# Patient Record
Sex: Female | Born: 1945
Health system: Southern US, Community
[De-identification: ages and names within clinical notes are randomized; demographics above are authoritative.]

## PROBLEM LIST (undated history)

## (undated) DIAGNOSIS — Z8719 Personal history of other diseases of the digestive system: Secondary | ICD-10-CM

## (undated) DIAGNOSIS — F39 Unspecified mood [affective] disorder: Secondary | ICD-10-CM

## (undated) DIAGNOSIS — Z9889 Other specified postprocedural states: Secondary | ICD-10-CM

## (undated) DIAGNOSIS — G2581 Restless legs syndrome: Secondary | ICD-10-CM

## (undated) DIAGNOSIS — D649 Anemia, unspecified: Secondary | ICD-10-CM

## (undated) DIAGNOSIS — G629 Polyneuropathy, unspecified: Secondary | ICD-10-CM

## (undated) DIAGNOSIS — R350 Frequency of micturition: Secondary | ICD-10-CM

## (undated) DIAGNOSIS — R112 Nausea with vomiting, unspecified: Secondary | ICD-10-CM

## (undated) DIAGNOSIS — K5792 Diverticulitis of intestine, part unspecified, without perforation or abscess without bleeding: Secondary | ICD-10-CM

## (undated) DIAGNOSIS — R011 Cardiac murmur, unspecified: Secondary | ICD-10-CM

## (undated) DIAGNOSIS — I341 Nonrheumatic mitral (valve) prolapse: Secondary | ICD-10-CM

## (undated) DIAGNOSIS — R197 Diarrhea, unspecified: Secondary | ICD-10-CM

## (undated) DIAGNOSIS — C801 Malignant (primary) neoplasm, unspecified: Secondary | ICD-10-CM

## (undated) DIAGNOSIS — F319 Bipolar disorder, unspecified: Secondary | ICD-10-CM

## (undated) DIAGNOSIS — F329 Major depressive disorder, single episode, unspecified: Secondary | ICD-10-CM

## (undated) DIAGNOSIS — F419 Anxiety disorder, unspecified: Secondary | ICD-10-CM

## (undated) DIAGNOSIS — I00 Rheumatic fever without heart involvement: Secondary | ICD-10-CM

## (undated) DIAGNOSIS — M1990 Unspecified osteoarthritis, unspecified site: Secondary | ICD-10-CM

## (undated) DIAGNOSIS — R351 Nocturia: Secondary | ICD-10-CM

## (undated) DIAGNOSIS — F32A Depression, unspecified: Secondary | ICD-10-CM

## (undated) HISTORY — DX: Diverticulitis of intestine, part unspecified, without perforation or abscess without bleeding: K57.92

## (undated) HISTORY — PX: ABDOMINAL HYSTERECTOMY: SHX81

## (undated) HISTORY — PX: CHOLECYSTECTOMY: SHX55

## (undated) HISTORY — DX: Rheumatic fever without heart involvement: I00

## (undated) HISTORY — PX: OTHER SURGICAL HISTORY: SHX169

## (undated) HISTORY — DX: Bipolar disorder, unspecified: F31.9

## (undated) HISTORY — PX: NISSEN FUNDOPLICATION: SHX2091

## (undated) HISTORY — PX: EYE SURGERY: SHX253

## (undated) HISTORY — PX: TONSILLECTOMY: SUR1361

## (undated) HISTORY — DX: Nonrheumatic mitral (valve) prolapse: I34.1

## (undated) HISTORY — DX: Unspecified osteoarthritis, unspecified site: M19.90

## (undated) HISTORY — PX: BACK SURGERY: SHX140

## (undated) HISTORY — PX: ROTATOR CUFF REPAIR: SHX139

---

## 1998-01-02 ENCOUNTER — Ambulatory Visit (HOSPITAL_BASED_OUTPATIENT_CLINIC_OR_DEPARTMENT_OTHER): Admission: RE | Admit: 1998-01-02 | Discharge: 1998-01-02 | Payer: Self-pay | Admitting: Orthopedic Surgery

## 2000-02-15 ENCOUNTER — Emergency Department (HOSPITAL_COMMUNITY): Admission: EM | Admit: 2000-02-15 | Discharge: 2000-02-15 | Payer: Self-pay | Admitting: Emergency Medicine

## 2001-01-18 HISTORY — PX: COLONOSCOPY: SHX174

## 2001-02-10 ENCOUNTER — Emergency Department (HOSPITAL_COMMUNITY): Admission: EM | Admit: 2001-02-10 | Discharge: 2001-02-10 | Payer: Self-pay | Admitting: Emergency Medicine

## 2001-02-13 ENCOUNTER — Ambulatory Visit (HOSPITAL_COMMUNITY): Admission: RE | Admit: 2001-02-13 | Discharge: 2001-02-13 | Payer: Self-pay | Admitting: Internal Medicine

## 2001-03-21 ENCOUNTER — Ambulatory Visit (HOSPITAL_COMMUNITY): Admission: RE | Admit: 2001-03-21 | Discharge: 2001-03-21 | Payer: Self-pay | Admitting: *Deleted

## 2001-09-29 ENCOUNTER — Ambulatory Visit (HOSPITAL_COMMUNITY): Admission: RE | Admit: 2001-09-29 | Discharge: 2001-09-29 | Payer: Self-pay | Admitting: Family Medicine

## 2001-09-29 ENCOUNTER — Encounter: Payer: Self-pay | Admitting: Family Medicine

## 2001-12-18 ENCOUNTER — Ambulatory Visit (HOSPITAL_COMMUNITY): Admission: RE | Admit: 2001-12-18 | Discharge: 2001-12-18 | Payer: Self-pay | Admitting: Neurology

## 2002-07-24 ENCOUNTER — Emergency Department (HOSPITAL_COMMUNITY): Admission: EM | Admit: 2002-07-24 | Discharge: 2002-07-24 | Payer: Self-pay | Admitting: *Deleted

## 2002-07-24 ENCOUNTER — Encounter: Payer: Self-pay | Admitting: Emergency Medicine

## 2004-02-21 ENCOUNTER — Ambulatory Visit (HOSPITAL_COMMUNITY): Admission: RE | Admit: 2004-02-21 | Discharge: 2004-02-21 | Payer: Self-pay | Admitting: Family Medicine

## 2004-04-20 ENCOUNTER — Ambulatory Visit (HOSPITAL_COMMUNITY): Admission: RE | Admit: 2004-04-20 | Discharge: 2004-04-20 | Payer: Self-pay | Admitting: Internal Medicine

## 2007-01-24 ENCOUNTER — Ambulatory Visit (HOSPITAL_COMMUNITY): Admission: RE | Admit: 2007-01-24 | Discharge: 2007-01-24 | Payer: Self-pay | Admitting: Family Medicine

## 2007-08-31 ENCOUNTER — Ambulatory Visit (HOSPITAL_COMMUNITY): Admission: RE | Admit: 2007-08-31 | Discharge: 2007-08-31 | Payer: Self-pay | Admitting: Family Medicine

## 2007-10-30 ENCOUNTER — Encounter (HOSPITAL_COMMUNITY): Admission: RE | Admit: 2007-10-30 | Discharge: 2007-11-29 | Payer: Self-pay | Admitting: Family Medicine

## 2007-12-01 ENCOUNTER — Encounter (HOSPITAL_COMMUNITY): Admission: RE | Admit: 2007-12-01 | Discharge: 2007-12-31 | Payer: Self-pay | Admitting: Family Medicine

## 2009-04-07 ENCOUNTER — Ambulatory Visit (HOSPITAL_COMMUNITY): Admission: RE | Admit: 2009-04-07 | Discharge: 2009-04-07 | Payer: Self-pay | Admitting: Ophthalmology

## 2009-05-19 ENCOUNTER — Ambulatory Visit (HOSPITAL_COMMUNITY): Admission: RE | Admit: 2009-05-19 | Discharge: 2009-05-19 | Payer: Self-pay | Admitting: Family Medicine

## 2009-09-20 HISTORY — PX: COLONOSCOPY: SHX174

## 2009-09-20 HISTORY — PX: ESOPHAGOGASTRODUODENOSCOPY: SHX1529

## 2009-09-26 ENCOUNTER — Ambulatory Visit: Payer: Self-pay | Admitting: Internal Medicine

## 2009-09-26 DIAGNOSIS — R197 Diarrhea, unspecified: Secondary | ICD-10-CM

## 2009-09-26 DIAGNOSIS — R159 Full incontinence of feces: Secondary | ICD-10-CM | POA: Insufficient documentation

## 2009-09-26 DIAGNOSIS — R1319 Other dysphagia: Secondary | ICD-10-CM

## 2009-10-16 ENCOUNTER — Ambulatory Visit: Payer: Self-pay | Admitting: Internal Medicine

## 2009-10-16 ENCOUNTER — Ambulatory Visit (HOSPITAL_COMMUNITY): Admission: RE | Admit: 2009-10-16 | Discharge: 2009-10-16 | Payer: Self-pay | Admitting: Internal Medicine

## 2009-10-17 ENCOUNTER — Encounter: Payer: Self-pay | Admitting: Internal Medicine

## 2010-02-20 ENCOUNTER — Encounter: Payer: Self-pay | Admitting: Urgent Care

## 2010-03-05 ENCOUNTER — Ambulatory Visit: Payer: Self-pay | Admitting: Gastroenterology

## 2010-03-10 ENCOUNTER — Ambulatory Visit (HOSPITAL_COMMUNITY): Admission: RE | Admit: 2010-03-10 | Discharge: 2010-03-10 | Payer: Self-pay | Admitting: Gastroenterology

## 2010-03-26 ENCOUNTER — Encounter: Payer: Self-pay | Admitting: Internal Medicine

## 2010-04-06 ENCOUNTER — Ambulatory Visit: Payer: Self-pay | Admitting: Internal Medicine

## 2010-04-06 ENCOUNTER — Ambulatory Visit (HOSPITAL_COMMUNITY): Admission: RE | Admit: 2010-04-06 | Discharge: 2010-04-06 | Payer: Self-pay | Admitting: Internal Medicine

## 2010-04-06 HISTORY — PX: ESOPHAGOGASTRODUODENOSCOPY: SHX1529

## 2010-04-11 ENCOUNTER — Emergency Department (HOSPITAL_COMMUNITY): Admission: EM | Admit: 2010-04-11 | Discharge: 2010-04-11 | Payer: Self-pay | Admitting: Emergency Medicine

## 2010-04-13 ENCOUNTER — Telehealth (INDEPENDENT_AMBULATORY_CARE_PROVIDER_SITE_OTHER): Payer: Self-pay

## 2010-05-04 ENCOUNTER — Encounter: Payer: Self-pay | Admitting: Urgent Care

## 2010-05-05 ENCOUNTER — Emergency Department (HOSPITAL_COMMUNITY): Admission: EM | Admit: 2010-05-05 | Discharge: 2010-05-05 | Payer: Self-pay | Admitting: Emergency Medicine

## 2010-05-12 ENCOUNTER — Ambulatory Visit: Payer: Self-pay | Admitting: Internal Medicine

## 2010-09-23 ENCOUNTER — Ambulatory Visit (HOSPITAL_COMMUNITY)
Admission: RE | Admit: 2010-09-23 | Discharge: 2010-09-23 | Payer: Self-pay | Source: Home / Self Care | Attending: Family Medicine | Admitting: Family Medicine

## 2010-10-11 ENCOUNTER — Encounter: Payer: Self-pay | Admitting: Family Medicine

## 2010-10-18 ENCOUNTER — Emergency Department (HOSPITAL_COMMUNITY)
Admission: EM | Admit: 2010-10-18 | Discharge: 2010-10-18 | Payer: Self-pay | Source: Home / Self Care | Admitting: Emergency Medicine

## 2010-10-20 NOTE — Assessment & Plan Note (Signed)
Summary: FECAL INCONTINENCE/SS   Visit Type:  Initial Consult Referring Provider:  Earma Reading Primary Care Provider:  [facility]  Chief Complaint:  fecal incontience.  History of Present Illness: Ms. Victoria Mccarthy is a pleasant 65 year old lady, who presents at the request of Earma Reading at [facility] for further evaluation of chronic diarrhea and fecal incontinence. Patient complains his symptoms for at least one year. She generally has 4-5 loose stools daily that sometimes his abdomen. Denies recent Abx use, travel abroad, ill contacts.  Consumes well-water. She never if has constipation or nocturnal diarrhea. She denies any melena or rectal bleeding. When the incontinence occurs she has only a couple seconds of sensation in the rectum or distal obstruction, now. She complains of a horrible odor. She denies any abdominal pain or heartburn. In the past several months she's had problems swallowing solid foods. She feels like the food dysphagia and lower esophagus. She takes Goody's powders regularly sometimes as much as 3-4 daily. Some days she does not take any.  Workup in November included negative stool studies. Patient states that all of her studies were negative. We received stool culture report which was negative. Labs August of 2010 showed AST 33, ALT slightly elevated at 57, albumin 4.4, Bilirubin and AP normal,  CBC normal, TSH normal, vitamin D level is 9 which is low.  Current Medications (verified): 1)  Alprazolam 1 Mg Tabs (Alprazolam) .... Take 1 Tablet By Mouth Four Times A Day 2)  Symbyax 6-50 Mg Caps (Olanzapine-Fluoxetine Hcl) .... Take 2 Tablets At Bedtime 3)  Buspar .... Take 1 Tablet By Mouth Three Times A Day 4)  Flexeril 5 Mg Tabs (Cyclobenzaprine Hcl) .... Take 1 Tablet By Mouth Once A Day 5)  Goody's .... As Needed 6)  Medication For Overactive Bladder  Allergies (verified): 1)  ! Darvocet 2)  ! Haldol 3)  ! Iodine 4)  !  Codeine  Past History:  Past Medical History: Rheumatic fever, MVP [facility] Osteoporosis Osteoarthritis Last EGD, 8/05, Dr. Jena Gauss, diverticulum vs rotated stomach, producing partitioning at [facility], prior fundoplication appears to have slipped Last TCS, 5/02, Dr. Jena Gauss, pancolonic deverticula, int hemorrhoids H/O diverticulitis, treated at least 5 times, hospitalized twice.  Last episode one year ago.  Past Surgical History: Hysterectomy, partial Back Surgery Cholecystectomy Lap Nissen, about 10 years ago, Dr. Vanna Scotland, repeat at [facility] 5 years ago Right cataract extraction Carpal Tunnel Release, right X2 Bladder Repair X2 for incontinence  Family History: Mother, CHF, DM, pacemaker Father, CVA, DM deceased No FH of CRC, chronic GI illnesses, liver disease.  Social History: Divorced. 2 children. Disabled secondary to bipolar d/o. Never used tob, no alcohol, no drugs.  Review of Systems General:  Denies fever, chills, anorexia, fatigue, and weight loss. Eyes:  Denies vision loss. ENT:  Complains of difficulty swallowing; denies sore throat and hoarseness. CV:  Denies chest pains, angina, palpitations, dyspnea on exertion, and peripheral edema. Resp:  Denies dyspnea at rest, dyspnea with exercise, and cough. GI:  See HPI. GU:  Complains of urinary incontinence; denies urinary burning and blood in urine. MS:  Complains of joint pain / LOM and low back pain. Derm:  Denies rash and itching. Neuro:  Denies weakness, paralysis, abnormal sensation, frequent headaches, memory loss, and confusion. Psych:  Complains of depression and anxiety; denies suicidal ideation. Endo:  Denies unusual weight change. Heme:  Denies bruising and bleeding. Allergy:  Denies hives and rash.  Vital Signs:  Patient profile:   65 year old female Height:      65.5 inches Weight:      193 pounds BMI:     31.74 Temp:     97.9 degrees F  oral Pulse rate:   88 / minute BP sitting:   140 / 90  (left arm) Cuff size:   regular  Vitals Entered By: Cloria Spring LPN (September 26, 2009 11:30 AM)  Physical Exam  General:  Well developed, well nourished, no acute distress. unkept.   Head:  Normocephalic and atraumatic. Eyes:  Conjunctivae pink, no scleral icterus.  Mouth:  Oropharyngeal mucosa moist, pink.  No lesions, erythema or exudate.   Tongue ring. Neck:  Supple; no masses or thyromegaly. Lungs:  Clear throughout to auscultation. Heart:  Regular rate and rhythm; no murmurs, rubs,  or bruits. Abdomen:  Obese. Normal BS.  Soft. Minimal tenderness LLQ.  No rebound or guarding. No abd bruit or hernia. No HSM or masses. Rectal:  deferred until time of colonoscopy.   Extremities:  No clubbing, cyanosis, edema or deformities noted. Neurologic:  Alert and  oriented x4;  grossly normal neurologically. Skin:  Intact without significant lesions or rashes. Cervical Nodes:  No significant cervical adenopathy. Psych:  Alert and cooperative. Normal mood and affect.  Impression & Recommendations:  Problem # 1:  FULL INCONTINENCE OF FECES (ICD-787.60)  Fecal incontinence associated with loose stool.  Multiple loose stools most days for past one year.  Recent stool studies negative.  Ddx includes IBS, microscopic/collagenous colitis.  Less likely neurogenic problem for incontinence.  Colonoscopy to be performed in near future.  Risks, alternatives, and benefits including but not limited to the risk of reaction to medication, bleeding, infection, and perforation were addressed.  Patient voiced understanding and provided verbal consent. Procedure to be done with MAC for sedation.  Patient c/o inadequate sedation with prior TCS.  Also on multiple antidepressants/anxiolytics. No ASA/Goody's powders for four days prior to procedure.  Orders: Consultation Level III (40981)  Problem # 2:  DYSPHAGIA (ICD-787.29)  H/O chronic GERD s/p lap Nissen  in remote past.  Patient states she had redo at [facility] several years ago, which I do not have any records.  C/O solid food dysphagia.  Abnormal EGD 2005.  Pursue EGD +/- ED as appropriate.  EGD to be performed in near future.  Risks, alternatives, benefits including but not limited to risk of reaction to medications, bleeding, infection, and perforation addressed.  Patient voiced understanding and verbal consent obtained.   Orders: Consultation Level III (19147)   I would like to thank Earma Reading, PA-C for allowing [place] to take part in the care of this nice patient.

## 2010-10-20 NOTE — Letter (Signed)
Summary: EGD/ED OR order  EGD/ED OR order   Imported By: Minna Merritts 03/26/2010 13:03:12  _____________________________________________________________________  External Attachment:    Type:   Image     Comment:   External Document

## 2010-10-20 NOTE — Assessment & Plan Note (Signed)
Summary: follow up after TCS - cdg   Visit Type:  Follow-up Visit Primary Care Provider:  Anner Crete  Chief Complaint:  F/U after TCS.  History of Present Illness: Patient here for f/u of EGD/TCS done in 1/11. She had normal esophagus status post passage of a 54-French Maloney dilator, intact Nissen fundoplication, diffuse patchy erythema and erosions of uncertain significance (likely aspirin and nonsteroidal antiinflammatory drugs effect), elongated redundant colon with pancolonic diverticula, random bx negative. Stool culture, CDiff, lactoferrin, O+P all negative.    Stools firm on Questran. One BM daily. No accidents. Still choking on foods. Choked on rice last week. No heartburn or reflux. Has had difficulty swallowing rice, bread, meats since esophageal dilation. No nausea. Vomited up food after became lodged last week.   Current Medications (verified): 1)  Alprazolam 1 Mg Tabs (Alprazolam) .... Take 1 Tablet By Mouth Four Times A Day 2)  Symbyax 6-50 Mg Caps (Olanzapine-Fluoxetine Hcl) .... Take 2 Tablets At Bedtime 3)  Buspar 15 Mg .... Take 1 Tablet By Mouth Three Times A Day 4)  Flexeril 10 Mg Tabs (Cyclobenzaprine Hcl) .... Take 1 Tablet By Mouth Twice Daily 5)  Goody's .... As Needed 6)  Medication For Overactive Bladder 7)  Cholestyramine 4 Gm Pack (Cholestyramine) .... 2 Grams Daily 2 Hrs Before and After All Other Meds For Stools 8)  Nabumetone 750 Mg Tabs (Nabumetone) .... Take 1 Tablet By Mouth Two Times A Day  Allergies (verified): 1)  ! Darvocet 2)  ! Haldol 3)  ! Iodine 4)  ! Codeine  Review of Systems      See HPI  Vital Signs:  Patient profile:   65 year old female Height:      65.5 inches Weight:      188 pounds BMI:     30.92 Temp:     98.8 degrees F oral Pulse rate:   84 / minute BP sitting:   134 / 80  (left arm) Cuff size:   large  Vitals Entered By: Cloria Spring LPN (March 05, 2010 1:58 PM)  Physical Exam  General:  Well developed, well  nourished, no acute distress. Head:  Normocephalic and atraumatic. Eyes:  sclera nonicteric Mouth:  op moist Lungs:  Clear throughout to auscultation. Heart:  Regular rate and rhythm; no murmurs, rubs,  or bruits. Abdomen:  Bowel sounds normal.  Abdomen is soft, nontender, nondistended.  No rebound or guarding.  No hepatosplenomegaly, masses or hernias.  No abdominal bruits.  Extremities:  No clubbing, cyanosis, edema or deformities noted. Neurologic:  Alert and  oriented x4;  grossly normal neurologically. Skin:  Intact without significant lesions or rashes. Psych:  Alert and cooperative. Normal mood and affect.  Impression & Recommendations:  Problem # 1:  DYSPHAGIA (ICD-787.29)  Persistent solid-food dysphagia s/p dilation in 1/11. BPE to look for stricture or motility d/o.   Orders: Est. Patient Level II (42595)  Problem # 2:  FULL INCONTINENCE OF FECES (ICD-787.60)  Diarrhea and incontinence resolved with low-dose questran.   Orders: Est. Patient Level II (63875)

## 2010-10-20 NOTE — Letter (Signed)
Summary: BPE ORDER  BPE ORDER   Imported By: Ave Filter 03/05/2010 14:26:53  _____________________________________________________________________  External Attachment:    Type:   Image     Comment:   External Document

## 2010-10-20 NOTE — Progress Notes (Signed)
Summary: phone note/ problems post EGD  Phone Note Call from Patient   Caller: Patient Summary of Call: Pt called and said she went to ER over the week-end. Had EGD iwth dilation on 04/06/2010, and has had alot of tongue swelling and throat pain. She said she was given liquid Roxicet and she is requesting more. Said she still can't lick her lips. Has steroids to start on. Please advise! Initial call taken by: Cloria Spring LPN,  April 13, 2010 2:51 PM     Appended Document: phone note/ problems post EGD ER note reviewed.  NUR was consulted, gave steroids/roxicet.  Please find out why patient has not started taking steroids she was given 2 days ago?  Please have her start them.  If she is in severe pain to where she feels she still needs narcotics, you will need to discuss this w/ [facility] please.  Thanks  Appended Document: phone note/ problems post EGD Pt said she did start steroids on Sun. Still in alot of pain and would like medication. Told her  I will let Dr. Darrick Penna know, not sure when i will hear back, if pain gets severe she might need to  go to the ER.  Appended Document: phone note/ problems post EGD Please callpt. She needs a GASTROGRAFFIN BARIUM SWALLOW if she is having chest pain after dilation TODAY. Dr. Jena Gauss did not see any tear in her esophagus after he stretched her. Will await study prior to recommendations for pain management.  Appended Document: phone note/ problems post EGD Pt said she is not having any chest pain. Still having the soreness/pain in her mouth and when she tries to swallow, can hardly eat food. Please advise!  Appended Document: phone note/ problems post EGD Please call pt. We will call in Viscous Lidocaine 2 tsp with 1 Tbsp Maalox or Mylanta, swish and swallow prior to meals every 4 hours as needed for mouth pain, #200 ml, rfx1.  Appended Document: phone note/ problems post EGD Pt informed, and Rx called to [facility] [employer].

## 2010-10-20 NOTE — Letter (Signed)
Summary: TCS/EGD ORDER  TCS/EGD ORDER   Imported By: Ave Filter 09/26/2009 13:07:29  _____________________________________________________________________  External Attachment:    Type:   Image     Comment:   External Document

## 2010-10-20 NOTE — Medication Information (Signed)
Summary: RX Folder  RX Folder   Imported By: Peggyann Shoals 02/20/2010 15:21:36  _____________________________________________________________________  External Attachment:    Type:   Image     Comment:   External Document  Appended Document: RX Folder:Questran Pt needs OV, will give enough until then.   Prescriptions: CHOLESTYRAMINE 4 GM PACK (CHOLESTYRAMINE) 2 grams daily 2 hrs before and after all other meds for stools  #60 grams x 0   Entered and Authorized by:   Joselyn Arrow FNP-BC   Signed by:   Joselyn Arrow FNP-BC on 02/23/2010   Method used:   Electronically to        [facility]* (retail)       [address]       [place], [place]  [address]       Ph: [phone]       Fax: [phone]   RxID:   [id number]     Appended Document: RX Folder PT HAD APPT ON 03/05/10

## 2010-10-20 NOTE — Letter (Signed)
Summary: REFERRAL DR [facility]  REFERRAL DR [facility]   Imported By: Diana Eves 10/17/2009 10:32:38  _____________________________________________________________________  External Attachment:    Type:   Image     Comment:   External Document

## 2010-10-20 NOTE — Assessment & Plan Note (Signed)
Summary: s/p EGD/ED, still having trouble swallowing & nausea/MM   Visit Type:  f/u Primary Care Provider:  [facility]  Chief Complaint:  follow up from procedure and having  dysphagia needs refills.  History of Present Illness: Ms Cambria is here for f/u. She had her second EGD/ED for the year back on 04/06/10. She had somewhat baggy atonic appearing esophagus, intact Nissen fundoplication, otherwise unremarkable esophagogastroduodenoscopy, status post dilation of the esophagus up to a 58-French size with [facility] dilators. BPE prior to her last EGD/ED did show obstruction of barium tablet. Original fundoplication was in the 1990s but she had repair approximately five years ago.  Still having trouble with breads, meats, rice. No pain with swallowing. After EGD, had severe tongue pain. Tongue was not red or swollen. Started day of procedure. Lasted for few days. No abd pain. BM regular on Questran. No melena, brbpr. No vomiting. No heartburn.   Current Medications (verified): 1)  Alprazolam 1 Mg Tabs (Alprazolam) .... Take 1 Tablet By Mouth Four Times A Day 2)  Symbyax 6-50 Mg Caps (Olanzapine-Fluoxetine Hcl) .... Take 2 Tablets At Bedtime 3)  Buspar 15 Mg .... Take 1 Tablet By Mouth Three Times A Day 4)  Flexeril 10 Mg Tabs (Cyclobenzaprine Hcl) .... Take 1 Tablet By Mouth Twice Daily 5)  Goody's .... As Needed 6)  Cholestyramine 4 Gm Pack (Cholestyramine) .... 2 Grams Daily 2 Hrs Before and After All Other Meds For Stools 7)  Nabumetone 750 Mg Tabs (Nabumetone) .... Take 1 Tablet By Mouth Two Times A Day  Allergies: 1)  ! Darvocet 2)  ! Haldol 3)  ! Iodine 4)  ! Codeine 5)  ! Dilaudid (Hydromorphone Hcl)  Past History:  Past Surgical History: Last updated: 09/26/2009 Hysterectomy, partial Back Surgery Cholecystectomy Lap Nissen, about 10 years ago, Dr. Vanna Scotland, repeat at [facility] 5 years ago Right cataract extraction Carpal Tunnel Release, right X2 Bladder  Repair X2 for incontinence  Past Medical History: Rheumatic fever, MVP B[facility] Overactive bladder Osteoporosis Osteoarthritis EGD, 8/05, Dr. Jena Gauss, diverticulum vs rotated stomach, producing partitioning at [facility], prior fundoplication appears to have slipped EGD, 7/11, Dr. Rourk--> Somewhat baggy atonic appearing esophagus, intact Nissen fundoplication, otherwise unremarkable esophagogastroduodenoscopy, status post dilation of the esophagus up to a 58-French size with [facility] dilators. EGD, 1/11, Dr. Elmer Ramp esophagus status post passage of a 54-French [facility] dilator.  Intact Nissen fundoplication.  Diffuse patchy erythema and erosions of uncertain significance (likely aspirin and nonsteroidal antiinflammatory drugs effect) status post biopsy (benign) TCS, 5/02, Dr. Jena Gauss, pancolonic deverticula, int hemorrhoids TCS, 1/11, Dr. Rourk--> Single external hemorrhoidal tag and anal papilla, otherwise normal rectum.  Elongated redundant colon with pancolonic diverticula. The remainder of the colonic mucosa appeared normal status post sigmoid biopsy and stool sampling (all unremarkable) H/O diverticulitis, treated at least 5 times, hospitalized twice.  Last episode one year ago.  Review of Systems      See HPI  Vital Signs:  Patient profile:   65 year old female Height:      65.5 inches Weight:      183 pounds BMI:     30.10 Temp:     98.6 degrees F oral Pulse rate:   72 / minute BP sitting:   118 / 80  (left arm) Cuff size:   regular  Vitals Entered By: Hendricks Limes LPN (May 12, 2010 10:42 AM)  Physical Exam  General:  Well developed, well nourished, no acute distress.  Head:  Normocephalic and atraumatic. Eyes:  sclera nonicteric Mouth:  op moist Lungs:  Clear throughout to auscultation. Heart:  Regular rate and rhythm; no murmurs, rubs,  or bruits. Abdomen:  Bowel sounds normal.  Abdomen is soft, nontender, nondistended.  No rebound or  guarding.  No hepatosplenomegaly, masses or hernias.  No abdominal bruits.  Extremities:  No clubbing, cyanosis, edema or deformities noted. Neurologic:  Alert and  oriented x4;  grossly normal neurologically. Skin:  Intact without significant lesions or rashes. Psych:  Alert and cooperative. Normal mood and affect.  Impression & Recommendations:  Problem # 1:  DYSPHAGIA (WUJ-811.91) Persistent dysphagia s/p second dilation this year. BPE prior to last EGD/Ed showed obstruction of barium tablet. She gives h/o esophageal spasms for which she takes NTG (3 times in last six months). No obvious esophageal dysmotility on BPE. At this point, would recommend referral to [facility] for further evaluation of ongoing dysphagia.   Problem # 2:  DIARRHEA (ICD-787.91) Doing well on Questran. Prescriptions: CHOLESTYRAMINE 4 GM PACK (CHOLESTYRAMINE) 2 grams daily 2 hrs before and after all other meds for stools  #124 grams x 5   Entered and Authorized by:   Leanna Battles. Dixon Boos   Signed by:   Leanna Battles Dixon Boos on 05/12/2010   Method used:   Electronically to        [facility]* (retail)       [address]       [place], [place]  [address]       Ph: [phone]       Fax: [phone]   RxID:   [id number]   Appended Document: Orders Update    Clinical Lists Changes  Orders: Added new Service order of Est. Patient Level II (44034) - Signed      Appended Document: s/p EGD/ED, still having trouble swallowing & nausea/MM made apt with Dr . Despina Hidden surgery at [facility] on 06-17-10 at 11:15 pt informed of apt  Appended Document: s/p EGD/ED, still having trouble swallowing & nausea/MM Durward Mallard, RMR wants her to have f/u with surgeon who did her wrap. Can you find out if that was [place] (that is who Erskine Squibb made appt with).  Appended Document: s/p EGD/ED, still having trouble swallowing & nausea/MM I spoke with Kyra Leyland at [facility] and she  confirmed Dr Lorin Picket did do her last wrap.

## 2010-10-20 NOTE — Medication Information (Signed)
Summary: Tax adviser   Imported By: Rosine Beat 05/04/2010 10:13:15  _____________________________________________________________________  External Attachment:    Type:   Image     Comment:   External Document  Appended Document: RX Folder:Cholestyramine

## 2010-12-05 LAB — BASIC METABOLIC PANEL
Chloride: 101 mEq/L (ref 96–112)
Creatinine, Ser: 0.87 mg/dL (ref 0.4–1.2)
GFR calc non Af Amer: >60 mL/min (ref >60)
Glucose, Bld: 142 mg/dL — ABNORMAL HIGH (ref 70–99)
Potassium: 4 mEq/L (ref 3.5–5.1)
Sodium: 134 mEq/L — ABNORMAL LOW (ref 135–145)

## 2010-12-05 LAB — HEMOGLOBIN AND HEMATOCRIT, BLOOD
HCT: 37.1 % (ref 36.0–46.0)
Hemoglobin: 12.8 g/dL (ref 12.0–15.0)

## 2010-12-06 LAB — CLOSTRIDIUM DIFFICILE EIA: C difficile Toxins A+B, EIA: NEGATIVE

## 2010-12-06 LAB — BASIC METABOLIC PANEL
BUN: 6 mg/dL (ref 6–23)
Calcium: 9.3 mg/dL (ref 8.4–10.5)
Chloride: 99 mEq/L (ref 96–112)
GFR calc Af Amer: >60 mL/min (ref >60)
Glucose, Bld: 105 mg/dL — ABNORMAL HIGH (ref 70–99)
Potassium: 3.6 mEq/L (ref 3.5–5.1)
Sodium: 136 mEq/L (ref 135–145)

## 2010-12-06 LAB — STOOL CULTURE

## 2010-12-06 LAB — HEMOGLOBIN AND HEMATOCRIT, BLOOD: Hemoglobin: 12.7 g/dL (ref 12.0–15.0)

## 2010-12-06 LAB — FECAL LACTOFERRIN, QUANT

## 2010-12-21 ENCOUNTER — Other Ambulatory Visit: Payer: Self-pay | Admitting: Gastroenterology

## 2010-12-27 LAB — HEMOGLOBIN AND HEMATOCRIT, BLOOD
HCT: 36.8 % (ref 36.0–46.0)
Hemoglobin: 13.1 g/dL (ref 12.0–15.0)

## 2010-12-27 LAB — BASIC METABOLIC PANEL
CO2: 28 mEq/L (ref 19–32)
Calcium: 9.8 mg/dL (ref 8.4–10.5)
Creatinine, Ser: 0.73 mg/dL (ref 0.4–1.2)
Glucose, Bld: 111 mg/dL — ABNORMAL HIGH (ref 70–99)

## 2011-02-05 NOTE — Cardiovascular Report (Signed)
[place]  Patient:    Victoria Mccarthy, Victoria Mccarthy                      MRN: [id number] Proc. Date: 03/21/01 Adm. Date:  [id number] Attending:  Lenise Herald H CC:         Burton Apley, M.D.   Cardiac Catheterization  PROCEDURES: 1. Left heart catheterization. 2. Coronary angiography. 3. Left ventriculogram.  COMPLICATIONS:  None.  INDICATIONS:  The patient is a 65 year old, white female, patient of Dr. Burton Apley with a positive family history of CAD and recurrent chest pain.  The patient underwent Persantine Cardiolite on February 21, 2001, revealing anterior wall thinning with suggestion of breast attenuation; however, anterior ischemia cannot be excluded.  She had normal EF.  A 2-D echocardiogram revealed normal LV function with mildly thickened aortic valve. Because of her recurrent chest pain, she is now brought to the cardiac catheterization lab to define her coronary anatomy.  DESCRIPTION OF PROCEDURE:  After given informed written consent, the patient was brought to the cardiac catheterization lab where her right and left groins were shaved, prepped, and draped in the usual sterile fashion.  ECG monitoring was established.  Using modified Seldinger technique, a #6 French arterial sheath was inserted in the right femoral artery.  A 6 French diagnostic catheter was then used to perform diagnostic angiography.  This reveals a large left main with no significant disease.  The LAD is a large vessel which courses to the apex and gave rise to one diagonal branch.  The LAD has no significant disease.  The first diagonal is a large vessel which bifurcates in its mid segment and has no significant disease.  The left coronary artery also gave rise to a large ramus intermedius, which bifurcated in its midportion and was noted to have a 50% proximal stenosis prior to bifurcation.  The left circumflex was a large vessel which coursed in the A-V groove  and gave rise to one obtuse marginal and the posterolateral branch and was a dominant vessel.  The circumflex was noted to have a distal 60% stenotic lesion in the distal portion of the A-V groove.  The remainder of the circumflex had no significant disease.  The first OM had no significant disease.  The second OM is a small vessel with no significant disease.  The right coronary artery is a medium sized vessel which is dominant and gives rise to the PDA as well as the posterolateral branch.  There is no significant disease in the RCA, PDA or posterolateral branch.  LEFT VENTRICULOGRAM:  The left ventriculogram reveals preserved EF at 50-55%.  HEMODYNAMICS:  Systemic arterial pressure 132/82, LV systemic pressure 132/12, LVEDP of 18.  Following the case, the right femoral site was closed using a Perclose device without complications.  A total of 1 g of Ancef was given prophylactically.  CONCLUSIONS: 1. No significant coronary artery disease. 2. Normal left ventricular systolic function. DD:  03/21/01 TD:  03/21/01 Job: [id number]

## 2011-02-05 NOTE — Op Note (Signed)
NAME:  Victoria Mccarthy, Victoria Mccarthy                         ACCOUNT NO.:  [id number]   MEDICAL RECORD NO.:  [id number]                   PATIENT TYPE:  AMB   LOCATION:  DAY                                  FACILITY:  [facility]   PHYSICIAN:  R. Roetta Sessions, M.D.              DATE OF BIRTH:  [birth year]   DATE OF PROCEDURE:  DATE OF DISCHARGE:                                 OPERATIVE REPORT   PROCEDURE:  Esophagogastroduodenoscopy, diagnostic.   INDICATION FOR PROCEDURE:  The patient is a 65 year old lady with a history  of regurgitation with partially digested food two to three times a day  postprandially.  She also gets left-sided retrosternal chest pain.  She  reports dysphagia to both solids and liquids and has not gotten any relief  with acid suppression therapy.  Past medical history is significant for  laparoscopic Nissen fundoplication back in the 90s.  EGD is now being done  to further evaluate her symptoms and the approach has been discussed with  the patient at length.  The potential risks, benefits, and alternatives have  been reviewed and questions answered.   PROCEDURE NOTE:  O2 saturation, blood pressure, pulse, and respirations were  monitored throughout the entire procedure.   CONSCIOUS SEDATION:  Versed 3 mg IV and Demerol 75 mg IV in divided doses.   SBE PROPHYLAXIS:  Ampicillin 2 grams IV, gentamicin 120 mg IV prior to the  procedure.   INSTRUMENT:  Olympus video system.   FINDINGS:  Examination of the tubular esophagus revealed normal mucosa.  There appeared to be a diverticulum versus rotation of the proximal stomach,  producing a large blind pouch.  As the distal esophagus was traversed there  was quite bit of food debris sequestered in this pocket.  Off to the side  was the lumen of the gastroesophageal junction.  The gastric cavity was  accessed, the pylorus was patent and easily traversed.  Examination of the  duodenal bulb and second portion revealed no abnormalities.   A retroflex  view of the proximal stomach and esophagogastric junction demonstrated a  rather loose and what appeared to be a partially slipped wrap.  There  appeared to be at least partial diaphragmatic hernia, I did not see anything  that looked obviously paraesophageal.  The scope was straightened and pulled  back to the EG junction.  Again, there was what appeared to be a  diverticulum or partitioning with a large blind pouch with quite a bit of  food debris retained within it.   THERAPEUTIC/DIAGNOSTIC MANEUVERS PERFORMED:  None.   The patient tolerated the procedure well and was reacted after endoscopy.   IMPRESSION:  1. Grossly normal-appearing esophagus except the esophagogastric junction.  2. Diverticulum, possibly __________ versus rotated stomach, producing     partitioning.  This is obstructing the transit of food and liquid from     the esophagus to the stomach.  3. Prior  fundoplication appears to have slipped, gastric mucosa otherwise     grossly normal.  Pylorus patent, duodenum appeared normal.   Today's findings likely explain the patient's symptoms.  We will need to go  ahead and proceed with an upper GI series to get a better look at what is  going on from a gross anatomy standpoint.  We will plan for later date and  further recommendations to follow.      ___________________________________________                                            Jonathon Bellows, M.D.   RMR/MEDQ  D:  04/20/2004  T:  04/20/2004  Job:  [id number]   cc:   Bernerd Limbo. Leona Carry, M.D.  [address]  [place]  [place] [address]  Fax: [phone]

## 2011-04-28 ENCOUNTER — Ambulatory Visit (HOSPITAL_COMMUNITY)
Admission: RE | Admit: 2011-04-28 | Discharge: 2011-04-28 | Disposition: A | Discharge status: Home / Self Care | Payer: Medicaid Other | Source: Ambulatory Visit | Attending: Physical Medicine and Rehabilitation | Admitting: Physical Medicine and Rehabilitation

## 2011-04-28 DIAGNOSIS — M6281 Muscle weakness (generalized): Secondary | ICD-10-CM | POA: Insufficient documentation

## 2011-04-28 DIAGNOSIS — IMO0001 Reserved for inherently not codable concepts without codable children: Secondary | ICD-10-CM | POA: Insufficient documentation

## 2011-04-28 DIAGNOSIS — M545 Low back pain, unspecified: Secondary | ICD-10-CM | POA: Insufficient documentation

## 2011-04-28 DIAGNOSIS — M961 Postlaminectomy syndrome, not elsewhere classified: Secondary | ICD-10-CM | POA: Insufficient documentation

## 2011-04-28 NOTE — Progress Notes (Signed)
INITIAL EVALUATION  Physical Therapy   Patient Name: Victoria Mccarthy Date Of Birth: 26-Jan-1946 Guardian Name: N/A Treatment ICD-9 Code: 16109 Address: [address] Date of Evaluation: 04/28/2011 [place], [place] [address] Requested Dates of Service: 04/28/2011 - 05/26/2011 Therapy History: No known therapy for this problem Reason For Referral: Recipient has a new injury, disease or condition Prior Level of Function: Independent/Modified Independent with all ADLs (OT/PT) or Audition, Communication, Voice and/or Swallowing Skills (ST/AUD) Additional Medical History: Pt is a 65 y.o female referred to PT secondary to low back pain and post laminectomy syndrome. Pt reports that she has had pain since 1983 after her initial back surgery. She reports that she had shoulder surgery 3 months ago which increased her back pain. She reports numbness in the R medial thigh and radiating pain in both legs, L >R. Pt. c/co is increased pain. Denies changes in B&B, no hx of cancer, MEDICAL HISTORY: back surgery 1983, bi-polar disorder, R RTC surgery 3 months ago, hysterectomy, gall bladder removal. PREVIOUS THERAPY: Pt has not received therapy for this condition in the past. Cognitive Status: Pt is alert and oriented x's 4. Social History: Lives alone, completes housework, enjoys sewing, quilting, Clinical cytogeneticist. Current Functional Status: Sit: 10 minutes; Walk: 15 minutes Stand: 10 minutes; Sleep: 2-3 hours and wakes up from pain. SUBJECTIVE: PATIENT GOAL: "I want to decrease my pain" Prematurity: N/A Severity Level: N/A Treatment Goals:  1. Goal: Pt will be Independent in HEP in order to maximize therapeutic effect.  Baseline: None Given  Duration: 2 Week(s)  2. Goal: Pt will have minimal spasm to gluteal region.  Baseline: PALPATION: Increased pain to L gluteal region.  Duration: 2 Week(s)  3. Goal: Pt will report pain less than 4/10 for 50% of her day while standing  Baseline: PAIN RATING: (on a scale from 0-10): worst: 10/10 best: 3 /10  current: 9/10 Nature: LB general pain to mid lumbar spine w/radiculopathy Aggravating Factors: increased movement. Alleviating Factors: Prednisone, Pain medication (oxycodone), laying in recliner  Duration: 2 Week(s)  4. Goal: Pt will increase LE strength by 1 muscle grade for improved ambulation.  Baseline: Lower Extremity Right Left Hip flexion 3/5 3/5 Glute Max 3/5 3/5 Glute Med 2+/5 3/5 Hip adductors 2/5 4/5 Quadriceps 5/5 3/5 Hamstrings 4/5 4/5 Lumbar 3/5 Abdominals 4/5  Duration: 2 Week(s)  5. Goal: 2. Pt will improve LE strength to WNL order to ambulate for 30 minutes in order to shop at the grocery store.  Baseline: Walk: 15 minutes  Duration: 4 Week(s)  6. Goal: 3. Pt will improve core strength to WNL in order to comfortably stand for 30 minutes in order to prepare a meal.  Baseline: Stand 10 minutes  Duration: 4 Week(s)  7. Goal: 4. Pt will present with lumbar AROM WNL in order to be comfortably complete her household activities.  Baseline: Lumbar: Flexion: 46 cm.; Extension: 23?, L SB: 12 in.; R SB: 10 in. (SB: measurements taken from tip of middle finger to fibular head) R rot: decreased 50% ; L rot: decreased 70% w/increased pain  Duration: 4 Week(s)  Treatment Frequency/Duration: 2x/week for 4 weeks  Units per visit: N/A  Additional Information: ASSESSMENT: Pt is a 65 y.o. female referred to PT for LBP and post laminectomy syndrome. After examination it was found that the patient has current body structure impairments including: increased pain with radicular pain to BLE, gluteal spasms, decreased LE and core strength, decreased flexibility, and impaired posture which are limiting her ability to  participate in community and household activities and functions. Pt will benefit from skilled PT service to address the above body structure impairments in order to maximize function in order to improve quality of life. TREATMENT DIAGNOSIS: Low Back Pain 724.2, Post Lamienctomy Syndrome 722.80  Rehab Potential: Fair PLAN: 1. Therapeutic exercise to include stretching, strengthening and HEP. 2. Gait training 3. Balance re-education 4. Manual techniques and modalities as needed for pain reduction. FREQUENCY / DURATION: 2 x/week x 4 weeks.

## 2011-04-28 NOTE — Progress Notes (Addendum)
Physical Therapy Evaluation  Patient Name: ORAH SONNEN ZOXWR'U Date: 04/28/2011 Time: 0454-0981 Initial Eval for Medicaid: 04/28/11 Charges: 1 Eval HPI: Symptoms/Limitations Symptoms: LBP, post lamienctomy syndrome.   How long can you sit comfortably?: 10 minutes.  Most comfortable with legs elevated in recliner. How long can you stand comfortably?: 10 minutes How long can you walk comfortably?: 15 minutes Pain Assessment Currently in Pain?: Yes Pain Score:   9 Pain Location: Back Pain Orientation: Right;Left Pain Radiating Towards: B LE Pain Onset: Other (comment) (Chronic) Pain Frequency: Constant Pain Relieving Factors: Prednisone, Pain medication (oxycodone), laying in recliner Past Medical History: No past medical history on file. Past Surgical History: No past surgical history on file.   Balance Balance Assessed: Yes Static Standing Balance Static Standing - Level of Assistance: 7: Independent Single Leg Stance - Right Leg: 3  Single Leg Stance - Left Leg: 2  Tandem Stance - Right Leg: 4  Tandem Stance - Left Leg: 7   Exercise/Treatments Lumbar Stretches Active Hamstring Stretch: 30 seconds (BLE) Single Knee to Chest Stretch: 30 seconds (BLE) Lower Trunk Rotation: Other (comment) (10x) Press Ups: 2 reps;10 seconds ITB Stretch: 30 seconds (BLE) Stability Exercises Functional Squats: 10 reps Hip Stretches Active Hamstring Stretch: 30 seconds (BLE) Knee Stretches Active Hamstring Stretch: 30 seconds (BLE) ITB Stretch: 30 seconds (BLE)    Goals PT Short Term Goals Short Term Goal 1: 1.Pt will be Independent in HEP in order to maximize therapeutic effect. Short Term Goal 2: 2.Pt will report pain less than 4/10 for 50% of her day while standing Short Term Goal 3: 3.Pt will have minimal spasm to gluteal region. Short Term Goal 4: 4.Pt will increase LE strength by 1 muscle grade for improved ambulation. PT Long Term Goals Long Term Goal 1: 1.Pt will report pain  less than or equal to 3/10 for 75% of her day for improved quality of life. Long Term Goal 2: 2.Pt will improve LE strength to WNL order to ambulate for 30 minutes in order to shop at the grocery store. Long Term Goal 3: 3.Pt will improve core strength to WNL in order to comfortably stand for 30 minutes  in order to prepare a meal.  Long Term Goal 4: 4.Pt will present with lumbar AROM WNL in order to be comfortably complete her household activities.  End of Session Patient Active Problem List  Diagnoses  . DYSPHAGIA  . DIARRHEA  . FULL INCONTINENCE OF FECES  . Low back pain  . Post laminectomy syndrome   PT - End of Session Activity Tolerance: Patient limited by pain PT Assessment and Plan Clinical Impression Statement: Pt is a 65 y.o. female  referred to PT for LBP and post laminectomy syndrome.  After examination it was found that the patient has current body structure impairments including: increased pain with radicular pain to BLE, gluteal spasms, decreased LE and core strength, decreased flexibility, and impaired posture which are limiting her ability to participate in community and household activities and functions. Pt will benefit from skilled PT service to address the above body structure impairments in order to maximize function in order to improve quality of life. Rehab Potential: Fair PT Frequency: Min 2X/week PT Duration: 4 weeks PT Treatment/Interventions: Gait training;Functional mobility training;Therapeutic exercise;Neuromuscular re-education;Balance training;Patient/family education;Other (comment) (manual and modalites for pain control) PT Plan: improve lumbar ROM, flexibility and strength.   Time Calculation Start Time: 1432 Stop Time: 1518 Time Calculation (min): 46 min  Jered Heiny 04/28/2011, 6:16 PM

## 2011-04-28 NOTE — Patient Instructions (Addendum)
Initial evaluation, HEP and education on role of PT.  Pt agreeable to tx. And plan.  Pt educated on MEDICAID and signed Medicaid financial responsibility form. Pt was given Kathie Rhodes R[place] number to contact if she would like to continue with therapy.

## 2011-06-11 ENCOUNTER — Other Ambulatory Visit: Payer: Self-pay | Admitting: Physical Medicine and Rehabilitation

## 2011-06-11 ENCOUNTER — Other Ambulatory Visit (HOSPITAL_COMMUNITY): Payer: Self-pay | Admitting: Physical Medicine and Rehabilitation

## 2011-06-11 DIAGNOSIS — N2889 Other specified disorders of kidney and ureter: Secondary | ICD-10-CM

## 2011-06-14 ENCOUNTER — Other Ambulatory Visit: Payer: Medicaid Other

## 2011-06-15 ENCOUNTER — Ambulatory Visit (HOSPITAL_COMMUNITY)
Admission: RE | Admit: 2011-06-15 | Discharge: 2011-06-15 | Disposition: A | Discharge status: Home / Self Care | Payer: Medicaid Other | Source: Ambulatory Visit | Attending: Physical Medicine and Rehabilitation | Admitting: Physical Medicine and Rehabilitation

## 2011-06-15 DIAGNOSIS — N289 Disorder of kidney and ureter, unspecified: Secondary | ICD-10-CM | POA: Insufficient documentation

## 2011-06-15 DIAGNOSIS — N2889 Other specified disorders of kidney and ureter: Secondary | ICD-10-CM

## 2011-06-15 DIAGNOSIS — R9389 Abnormal findings on diagnostic imaging of other specified body structures: Secondary | ICD-10-CM | POA: Insufficient documentation

## 2011-06-18 ENCOUNTER — Other Ambulatory Visit: Payer: Self-pay | Admitting: Urology

## 2011-06-18 ENCOUNTER — Ambulatory Visit (HOSPITAL_COMMUNITY)
Admission: RE | Admit: 2011-06-18 | Discharge: 2011-06-18 | Disposition: A | Discharge status: Home / Self Care | Payer: Medicaid Other | Source: Ambulatory Visit | Attending: Urology | Admitting: Urology

## 2011-06-18 DIAGNOSIS — C649 Malignant neoplasm of unspecified kidney, except renal pelvis: Secondary | ICD-10-CM | POA: Insufficient documentation

## 2011-06-18 DIAGNOSIS — J9819 Other pulmonary collapse: Secondary | ICD-10-CM | POA: Insufficient documentation

## 2011-06-18 DIAGNOSIS — D4100 Neoplasm of uncertain behavior of unspecified kidney: Secondary | ICD-10-CM

## 2011-06-25 ENCOUNTER — Other Ambulatory Visit: Payer: Self-pay | Admitting: Urology

## 2011-06-25 DIAGNOSIS — D4100 Neoplasm of uncertain behavior of unspecified kidney: Secondary | ICD-10-CM

## 2011-06-29 ENCOUNTER — Ambulatory Visit (HOSPITAL_COMMUNITY)
Admission: RE | Admit: 2011-06-29 | Discharge: 2011-06-29 | Disposition: A | Discharge status: Home / Self Care | Payer: Medicaid Other | Source: Ambulatory Visit | Attending: Urology | Admitting: Urology

## 2011-06-29 DIAGNOSIS — R9389 Abnormal findings on diagnostic imaging of other specified body structures: Secondary | ICD-10-CM | POA: Insufficient documentation

## 2011-06-29 DIAGNOSIS — R319 Hematuria, unspecified: Secondary | ICD-10-CM | POA: Insufficient documentation

## 2011-06-29 DIAGNOSIS — M545 Low back pain, unspecified: Secondary | ICD-10-CM | POA: Insufficient documentation

## 2011-06-29 DIAGNOSIS — D4100 Neoplasm of uncertain behavior of unspecified kidney: Secondary | ICD-10-CM

## 2011-06-29 MED ORDER — IOHEXOL 300 MG/ML  SOLN
125.0000 mL | Freq: Once | INTRAMUSCULAR | Status: AC | PRN
  Administered 2011-06-29: 125 mL via INTRAVENOUS

## 2011-08-04 ENCOUNTER — Other Ambulatory Visit: Payer: Self-pay | Admitting: Urology

## 2011-08-09 ENCOUNTER — Other Ambulatory Visit: Payer: Self-pay | Admitting: Urology

## 2011-08-11 ENCOUNTER — Encounter (HOSPITAL_COMMUNITY): Payer: Self-pay | Admitting: Pharmacy Technician

## 2011-08-17 ENCOUNTER — Other Ambulatory Visit (HOSPITAL_COMMUNITY): Payer: Medicaid Other

## 2011-08-20 ENCOUNTER — Encounter (HOSPITAL_COMMUNITY)
Admission: RE | Admit: 2011-08-20 | Discharge: 2011-08-20 | Disposition: A | Discharge status: Home / Self Care | Payer: Medicaid Other | Source: Ambulatory Visit | Attending: Urology | Admitting: Urology

## 2011-08-20 ENCOUNTER — Encounter (HOSPITAL_COMMUNITY): Payer: Self-pay

## 2011-08-20 ENCOUNTER — Other Ambulatory Visit: Payer: Self-pay

## 2011-08-20 HISTORY — DX: Frequency of micturition: R35.0

## 2011-08-20 HISTORY — DX: Anemia, unspecified: D64.9

## 2011-08-20 HISTORY — DX: Nocturia: R35.1

## 2011-08-20 HISTORY — DX: Depression, unspecified: F32.A

## 2011-08-20 HISTORY — DX: Unspecified osteoarthritis, unspecified site: M19.90

## 2011-08-20 HISTORY — DX: Diarrhea, unspecified: R19.7

## 2011-08-20 HISTORY — DX: Other specified postprocedural states: R11.2

## 2011-08-20 HISTORY — DX: Other specified postprocedural states: Z98.890

## 2011-08-20 HISTORY — DX: Major depressive disorder, single episode, unspecified: F32.9

## 2011-08-20 HISTORY — DX: Unspecified mood (affective) disorder: F39

## 2011-08-20 HISTORY — DX: Restless legs syndrome: G25.81

## 2011-08-20 HISTORY — DX: Malignant (primary) neoplasm, unspecified: C80.1

## 2011-08-20 LAB — BASIC METABOLIC PANEL
BUN: 5 mg/dL — ABNORMAL LOW (ref 6–23)
Chloride: 95 mEq/L — ABNORMAL LOW (ref 96–112)
Glucose, Bld: 116 mg/dL — ABNORMAL HIGH (ref 70–99)
Potassium: 3.8 mEq/L (ref 3.5–5.1)
Sodium: 132 mEq/L — ABNORMAL LOW (ref 135–145)

## 2011-08-20 LAB — SURGICAL PCR SCREEN: MRSA, PCR: NEGATIVE

## 2011-08-20 LAB — CBC
HCT: 40.7 % (ref 36.0–46.0)
Hemoglobin: 13.9 g/dL (ref 12.0–15.0)
MCH: 29.8 pg (ref 26.0–34.0)
MCHC: 34.2 g/dL (ref 30.0–36.0)
RBC: 4.67 MIL/uL (ref 3.87–5.11)

## 2011-08-20 NOTE — Patient Instructions (Addendum)
20 Victoria Mccarthy  08/20/2011   Your procedure is scheduled on: 08/23/11  Report to [facility] at  5:15AM.  Call this number if you have problems the morning of surgery: [phone]   Remember:   Do not eat food:After Midnight.  May have clear liquids:until Midnight .  Clear liquids include soda, tea, black coffee, apple or grape juice, broth.  Take these medicines the morning of surgery with A SIP OF WATER: xanax / buspar / visteril / oxycodone   Do not wear jewelry, make-up or nail polish.  Do not wear lotions, powders, or perfumes. You may wear deodorant.  Do not shave 48 hours prior to surgery.  Do not bring valuables to the hospital.  Contacts, dentures or bridgework may not be worn into surgery.  Leave suitcase in the car. After surgery it may be brought to your room.  For patients admitted to the hospital, checkout time is 11:00 AM the day of discharge.   Patients discharged the day of surgery will not be allowed to drive home.  Name and phone number of your driver:   Special Instructions: CHG Shower Use Special Wash: 1/2 bottle night before surgery and 1/2 bottle morning of surgery.   Please read over the following fact sheets that you were given: MRSA Information

## 2011-08-22 NOTE — H&P (Signed)
Chief Complaint  Left renal neoplasm   History of Present Illness  Ms. Victoria Mccarthy is a 65 year old followed by Dr. Retta Diones for urge incontinence which has been treated with Vesicare 10 mg.  She also has chronic back pain and this has been treated and followed by Dr. Sheran Luz.  During the evaluation for her back pain, she did undergo an MRI of the spine which incidentally detected a left renal mass concerning for possible malignancy.  She was sent to Dr. Retta Diones for further evaluation and a dedicated renal CT scan was performed which confirmed an enhancing 3.4 x 3.2 cm neoplasm off the lower pole of the left kidney.  The lesion does abut the renal collecting system and is mostly endopytic although partially exophytic. There is no renal vein/IVC involvement, no regional lymphadenopathy or other evidence of abdominal metastases.  The contralteral kidney is normal and the adrenal glands are unremarkable.  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  She has undergone a chest x-ray which was negative for metatstases and her LFTs and alkaline phosphatase levels were normal. She has no abdominal pain related to this mass and no hematuria.  She has no family history of kidney cancer.  Baseline renal function: Cr. 0.73, eGFR > 60 Nephrometry score: 8p   Past Medical History Problems  1. History of  Anxiety (Symptom) 300.00 2. History of  Arthritis V13.4 3. History of  Depression 311 4. History of  Esophageal Reflux 530.81  Surgical History Problems  1. History of  Back Surgery 2. History of  Bladder Surgery 3. History of  Esophagogastric Fundoplasty Nissen Fundoplication 4. History of  Hysterectomy V45.77 5. History of  Repair Of Paraesophageal Hiatus Hernia   She apparently underwent attempt at laparoscopic hiatal hernia repair but necessitated open surgical conversion due to adhesions.   Current Meds 1. Cholestyramine  POWD; Therapy: (Recorded:29Nov2011) to 2. Cyclobenzaprine HCl 10 MG Oral Tablet; Therapy: (Recorded:29Nov2011) to 3. Symbyax 6-50 MG Oral Capsule; Therapy: (Recorded:29Nov2011) to 4. VESIcare 10 MG Oral Tablet; Therapy: (Recorded:29Nov2011) to 5. Xanax 1 MG Oral Tablet; Therapy: (Recorded:29Nov2011) to  Allergies Medication  1. Haldol SOLN  Family History Problems  1. Maternal history of  Diabetes Mellitus V18.0 2. Paternal history of  Diabetes Mellitus V18.0 3. 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    Vitals Vital Signs [Data Includes: Last 1 Day]  13Nov2012 09:07AM  Blood Pressure: 158 / 106 Heart Rate: 109  Physical Exam Constitutional: Well nourished and well developed . No acute distress.  ENT:. The ears and nose are normal in appearance.  Neck: The appearance of the neck is normal and no neck mass is present.  Pulmonary: No respiratory distress, normal respiratory rhythm and effort and clear bilateral breath sounds.  Cardiovascular: Heart rate and rhythm are normal . No peripheral edema.  Abdomen: upper midline incision site(s) well healed. The abdomen is soft and nontender. No masses are palpated. No CVA tenderness. No hernias are palpable. No hepatosplenomegaly noted.  Lymphatics: The supraclavicular, femoral and inguinal nodes are not enlarged or tender.  Skin: Normal skin turgor,  no visible rash and no visible skin lesions.  Neuro/Psych:. Mood and affect are appropriate.    Results/Data Urine [Data Includes: Last 1 Day]  13Nov2012  COLOR: YELLOW  Reference Range  YELLOW APPEARANCE: CLEAR  Reference Range CLEAR SPECIFIC GRAVITY: <1.005  Abnormal Low Reference Range 1.005-1.030 pH: 6.5  Reference Range 5.0-8.0 GLUCOSE: NEG mg/dL Reference Range NEG BILIRUBIN: NEG  Reference Range NEG KETONE: NEG mg/dL Reference Range NEG BLOOD: NEG  Reference Range NEG PROTEIN: NEG mg/dL Reference Range NEG UROBILINOGEN: 0.2 mg/dL Reference Range 1.6-1.0 NITRITE: NEG  Reference Range NEG LEUKOCYTE ESTERASE: NEG  Reference Range NEG   I have independently reviewed her CT scan, chest x-ray, laboratory work, and medical records with findings as dictated above.   Assessment Assessed  1. Renal Neoplasm 239.5  Plan Health Maintenance (V70.0)  1. UA With REFLEX  Done: 13Nov2012 09:01AM Renal Neoplasm (239.5)  2. Follow-up Schedule Surgery Office  Follow-up  Requested for: 13Nov2012  Discussion/Summary  1. Left renal neoplasm concerning for malignancy:   The patient was provided information regarding their renal mass including the relative risk of benign versus malignant pathology and the natural history of renal cell carcinoma and other possible malignancies of the kidney. The role of renal biopsy, laboratory testing, and imaging studies to further characterize renal masses and/or the presence of metastatic disease were explained. We discussed the role of active surveillance, surgical therapy with both radical nephrectomy and nephron-sparing surgery, and ablative therapy in the treatment of renal masses. In addition, we discussed our goals of providing an accurate diagnosis and oncologic control while maintaining optimal renal function as appropriate based on the size, location, and complexity of their renal mass as well as their co-morbidities.    We have discussed the risks of treatment in detail including but not limited to bleeding, infection, heart attack, stroke, death, venothromoboembolism, cancer recurrence, injury/damage to surrounding organs and structures,  urine leak, the possibility of open surgical conversion for patients undergoing minimally invasive surgery, the risk of developing chronic kidney disease and its associated implications, and the potential risk of end stage renal disease possibly necessitating dialysis.     Although her renal tumor does extend fairly centrally, I do think it could likely be managed with a partial nephrectomy and have recommended an attempted left partial nephrectomy. Due to the fact that she does have a large upper midline incision and has known issues related to abdominal adhesions, she understands the very high-risk for necessitating an open surgery although I did recommend an attempt at minimally invasive surgery with a left robotic-assisted laparoscopic partial nephrectomy.  She therefore understands the increased risk of possible open surgical conversion. She did specifically mention concern about lawsuits related to robotic surgery and I assured her that there have been no FDA recalls regarding the [employer] robotic system and that the risks/complications that have been associated with any lawsuits are related to the surgery itself and are known complications of this type of surgery regardless of the technique or instrumentation utilized. We have specifically reviewed those risks today. She expresses her understanding is agreeable to proceed with the proposed procedure and gives her informed consent. She was given the option of having this done in a minimally invasive fashion or via an open approach and she did wish to proceed with minimally invasive surgery if able.  CC: Dr. Marcine Matar Dr. Karleen Hampshire    Amendment: I also explicitly told her that her chronic back pain is unrelated to her renal neoplasm  and would not be improved after surgery.  Amended By: Heloise Purpura; 08/03/2011 9:55 AMEST

## 2011-08-23 ENCOUNTER — Other Ambulatory Visit: Payer: Self-pay | Admitting: Urology

## 2011-08-23 ENCOUNTER — Encounter (HOSPITAL_COMMUNITY): Payer: Self-pay | Admitting: Certified Registered Nurse Anesthetist

## 2011-08-23 ENCOUNTER — Encounter (HOSPITAL_COMMUNITY): Payer: Self-pay | Admitting: *Deleted

## 2011-08-23 ENCOUNTER — Inpatient Hospital Stay (HOSPITAL_COMMUNITY)
Admission: RE | Admit: 2011-08-23 | Discharge: 2011-08-25 | DRG: 658 | Disposition: A | Discharge status: Home / Self Care | Payer: Medicaid Other | Source: Ambulatory Visit | Attending: Urology | Admitting: Urology

## 2011-08-23 ENCOUNTER — Inpatient Hospital Stay (HOSPITAL_COMMUNITY): Payer: Medicaid Other | Admitting: Certified Registered Nurse Anesthetist

## 2011-08-23 ENCOUNTER — Encounter (HOSPITAL_COMMUNITY)
Admission: RE | Disposition: A | Discharge status: Home / Self Care | Payer: Self-pay | Source: Ambulatory Visit | Attending: Urology

## 2011-08-23 DIAGNOSIS — Z01812 Encounter for preprocedural laboratory examination: Secondary | ICD-10-CM

## 2011-08-23 DIAGNOSIS — K219 Gastro-esophageal reflux disease without esophagitis: Secondary | ICD-10-CM | POA: Diagnosis present

## 2011-08-23 DIAGNOSIS — G8929 Other chronic pain: Secondary | ICD-10-CM | POA: Diagnosis present

## 2011-08-23 DIAGNOSIS — M129 Arthropathy, unspecified: Secondary | ICD-10-CM | POA: Diagnosis present

## 2011-08-23 DIAGNOSIS — F329 Major depressive disorder, single episode, unspecified: Secondary | ICD-10-CM | POA: Diagnosis present

## 2011-08-23 DIAGNOSIS — F411 Generalized anxiety disorder: Secondary | ICD-10-CM | POA: Diagnosis present

## 2011-08-23 DIAGNOSIS — M549 Dorsalgia, unspecified: Secondary | ICD-10-CM | POA: Diagnosis present

## 2011-08-23 DIAGNOSIS — C649 Malignant neoplasm of unspecified kidney, except renal pelvis: Principal | ICD-10-CM | POA: Diagnosis present

## 2011-08-23 DIAGNOSIS — F3289 Other specified depressive episodes: Secondary | ICD-10-CM | POA: Diagnosis present

## 2011-08-23 HISTORY — PX: ROBOT ASSISTED LAPAROSCOPIC NEPHRECTOMY: SHX5140

## 2011-08-23 LAB — TYPE AND SCREEN
ABO/RH(D): O POS
Antibody Screen: NEGATIVE

## 2011-08-23 LAB — BASIC METABOLIC PANEL
BUN: 7 mg/dL (ref 6–23)
CO2: 27 mEq/L (ref 19–32)
GFR calc non Af Amer: 88 mL/min — ABNORMAL LOW (ref >90)
Glucose, Bld: 161 mg/dL — ABNORMAL HIGH (ref 70–99)
Potassium: 3.6 mEq/L (ref 3.5–5.1)
Sodium: 130 mEq/L — ABNORMAL LOW (ref 135–145)

## 2011-08-23 LAB — HEMOGLOBIN AND HEMATOCRIT, BLOOD: HCT: 35 % — ABNORMAL LOW (ref 36.0–46.0)

## 2011-08-23 SURGERY — ROBOTIC ASSISTED LAPAROSCOPIC NEPHRECTOMY
Anesthesia: General | Laterality: Left | Wound class: Clean Contaminated

## 2011-08-23 MED ORDER — LACTATED RINGERS IR SOLN
Status: DC | PRN
  Administered 2011-08-23: 1000 mL

## 2011-08-23 MED ORDER — DOCUSATE SODIUM 100 MG PO CAPS
100.0000 mg | ORAL_CAPSULE | Freq: Two times a day (BID) | ORAL | Status: DC
  Administered 2011-08-23 – 2011-08-25 (×4): 100 mg via ORAL
  Filled 2011-08-23 (×5): qty 1

## 2011-08-23 MED ORDER — DEXAMETHASONE SODIUM PHOSPHATE 10 MG/ML IJ SOLN
INTRAMUSCULAR | Status: DC | PRN
  Administered 2011-08-23: 10 mg via INTRAVENOUS

## 2011-08-23 MED ORDER — ZOLPIDEM TARTRATE 5 MG PO TABS
5.0000 mg | ORAL_TABLET | Freq: Every evening | ORAL | Status: DC | PRN
  Administered 2011-08-23 – 2011-08-24 (×2): 5 mg via ORAL
  Filled 2011-08-23 (×2): qty 1

## 2011-08-23 MED ORDER — LACTATED RINGERS IV SOLN: INTRAVENOUS | Status: DC

## 2011-08-23 MED ORDER — MIDAZOLAM HCL 5 MG/5ML IJ SOLN
INTRAMUSCULAR | Status: DC | PRN
  Administered 2011-08-23: 2 mg via INTRAVENOUS

## 2011-08-23 MED ORDER — GLYCOPYRROLATE 0.2 MG/ML IJ SOLN
INTRAMUSCULAR | Status: DC | PRN
  Administered 2011-08-23: .8 mg via INTRAVENOUS

## 2011-08-23 MED ORDER — MORPHINE SULFATE 2 MG/ML IJ SOLN
2.0000 mg | INTRAMUSCULAR | Status: DC | PRN
Start: 2011-08-23 — End: 2011-08-24
  Administered 2011-08-23 – 2011-08-24 (×5): 2 mg via INTRAVENOUS
  Administered 2011-08-24: 4 mg via INTRAVENOUS
  Filled 2011-08-23: qty 1
  Filled 2011-08-23: qty 2
  Filled 2011-08-23 (×2): qty 1
  Filled 2011-08-23: qty 2
  Filled 2011-08-23: qty 1

## 2011-08-23 MED ORDER — ROCURONIUM BROMIDE 100 MG/10ML IV SOLN
INTRAVENOUS | Status: DC | PRN
  Administered 2011-08-23: 20 mg via INTRAVENOUS
  Administered 2011-08-23: 10 mg via INTRAVENOUS
  Administered 2011-08-23: 50 mg via INTRAVENOUS
  Administered 2011-08-23: 10 mg via INTRAVENOUS

## 2011-08-23 MED ORDER — BUPIVACAINE LIPOSOME 1.3 % IJ SUSP
INTRAMUSCULAR | Status: DC | PRN
  Administered 2011-08-23: 40 mL

## 2011-08-23 MED ORDER — SCOPOLAMINE 1 MG/3DAYS TD PT72
MEDICATED_PATCH | TRANSDERMAL | Status: DC | PRN
  Administered 2011-08-23: 1 via TRANSDERMAL

## 2011-08-23 MED ORDER — CEFAZOLIN SODIUM 1-5 GM-% IV SOLN
1.0000 g | Freq: Three times a day (TID) | INTRAVENOUS | Status: AC
  Administered 2011-08-23 (×2): 1 g via INTRAVENOUS
  Filled 2011-08-23 (×3): qty 50

## 2011-08-23 MED ORDER — FENTANYL CITRATE 0.05 MG/ML IJ SOLN
25.0000 ug | INTRAMUSCULAR | Status: DC | PRN
  Administered 2011-08-23 (×3): 25 ug via INTRAVENOUS

## 2011-08-23 MED ORDER — DEXTROSE-NACL 5-0.45 % IV SOLN
INTRAVENOUS | Status: DC
  Administered 2011-08-23 – 2011-08-24 (×3): via INTRAVENOUS

## 2011-08-23 MED ORDER — LIDOCAINE HCL (CARDIAC) 20 MG/ML IV SOLN
INTRAVENOUS | Status: DC | PRN
  Administered 2011-08-23: 80 mg via INTRAVENOUS

## 2011-08-23 MED ORDER — EPHEDRINE SULFATE 50 MG/ML IJ SOLN
INTRAMUSCULAR | Status: DC | PRN
  Administered 2011-08-23: 10 mg via INTRAVENOUS
  Administered 2011-08-23 (×2): 5 mg via INTRAVENOUS
  Administered 2011-08-23: 10 mg via INTRAVENOUS

## 2011-08-23 MED ORDER — MANNITOL 25 % IV SOLN
INTRAVENOUS | Status: DC | PRN
  Administered 2011-08-23 (×2): 12.5 g via INTRAVENOUS

## 2011-08-23 MED ORDER — ONDANSETRON HCL 4 MG/2ML IJ SOLN
INTRAMUSCULAR | Status: DC | PRN
  Administered 2011-08-23: 4 mg via INTRAVENOUS

## 2011-08-23 MED ORDER — DIPHENHYDRAMINE HCL 50 MG/ML IJ SOLN: 12.5000 mg | Freq: Four times a day (QID) | INTRAMUSCULAR | Status: DC | PRN

## 2011-08-23 MED ORDER — ONDANSETRON HCL 4 MG/2ML IJ SOLN: 4.0000 mg | INTRAMUSCULAR | Status: DC | PRN

## 2011-08-23 MED ORDER — DIPHENHYDRAMINE HCL 12.5 MG/5ML PO ELIX: 12.5000 mg | ORAL_SOLUTION | Freq: Four times a day (QID) | ORAL | Status: DC | PRN

## 2011-08-23 MED ORDER — LACTATED RINGERS IV SOLN
INTRAVENOUS | Status: DC | PRN
  Administered 2011-08-23 (×3): via INTRAVENOUS

## 2011-08-23 MED ORDER — PROPOFOL 10 MG/ML IV EMUL
INTRAVENOUS | Status: DC | PRN
  Administered 2011-08-23: 200 mg via INTRAVENOUS

## 2011-08-23 MED ORDER — NEOSTIGMINE METHYLSULFATE 1 MG/ML IJ SOLN
INTRAMUSCULAR | Status: DC | PRN
  Administered 2011-08-23: 5 mg via INTRAVENOUS

## 2011-08-23 MED ORDER — FENTANYL CITRATE 0.05 MG/ML IJ SOLN
INTRAMUSCULAR | Status: DC | PRN
  Administered 2011-08-23: 25 ug via INTRAVENOUS
  Administered 2011-08-23: 50 ug via INTRAVENOUS
  Administered 2011-08-23: 100 ug via INTRAVENOUS
  Administered 2011-08-23: 25 ug via INTRAVENOUS

## 2011-08-23 MED ORDER — BUPIVACAINE LIPOSOME 1.3 % IJ SUSP
20.0000 mL | Freq: Once | INTRAMUSCULAR | Status: DC
  Filled 2011-08-23: qty 20

## 2011-08-23 MED ORDER — CEFAZOLIN SODIUM-DEXTROSE 2-3 GM-% IV SOLR
2.0000 g | Freq: Once | INTRAVENOUS | Status: AC
  Administered 2011-08-23: 2 g via INTRAVENOUS

## 2011-08-23 MED ORDER — PROMETHAZINE HCL 25 MG/ML IJ SOLN: 6.2500 mg | INTRAMUSCULAR | Status: DC | PRN

## 2011-08-23 MED ORDER — FENTANYL CITRATE 0.05 MG/ML IJ SOLN
INTRAMUSCULAR | Status: AC
  Filled 2011-08-23: qty 2

## 2011-08-23 SURGICAL SUPPLY — 71 items
ADH SKN CLS APL DERMABOND .7 (GAUZE/BANDAGES/DRESSINGS) ×1
APL ESCP 34 STRL LF DISP (HEMOSTASIS) ×1
APPLICATOR SURGIFLO ENDO (HEMOSTASIS) ×1 IMPLANT
BAG SPEC RTRVL LRG 6X4 10 (ENDOMECHANICALS) ×1
CANNULA SEAL DVNC (CANNULA) ×3 IMPLANT
CANNULA SEALS DA VINCI (CANNULA) ×3
CATH FOLEY LATEX FREE 16FR (CATHETERS) ×1 IMPLANT
CHLORAPREP W/TINT 26ML (MISCELLANEOUS) ×2 IMPLANT
CLIP LIGATING HEM O LOK PURPLE (MISCELLANEOUS) ×1 IMPLANT
CLIP LIGATING HEMO O LOK GREEN (MISCELLANEOUS) ×1 IMPLANT
CLIP SUT LAPRA TY ABSORB (SUTURE) ×2 IMPLANT
CLOTH BEACON ORANGE TIMEOUT ST (SAFETY) ×2 IMPLANT
CORD HIGH FREQUENCY UNIPOLAR (ELECTROSURGICAL) ×1 IMPLANT
CORDS BIPOLAR (ELECTRODE) ×2 IMPLANT
COVER SURGICAL LIGHT HANDLE (MISCELLANEOUS) ×2 IMPLANT
COVER TIP SHEARS 8 DVNC (MISCELLANEOUS) ×1 IMPLANT
COVER TIP SHEARS 8MM DA VINCI (MISCELLANEOUS) ×1
DECANTER SPIKE VIAL GLASS SM (MISCELLANEOUS) ×1 IMPLANT
DERMABOND ADVANCED (GAUZE/BANDAGES/DRESSINGS) ×1
DERMABOND ADVANCED .7 DNX12 (GAUZE/BANDAGES/DRESSINGS) ×2 IMPLANT
DRAIN CHANNEL 15F RND FF 3/16 (WOUND CARE) ×2 IMPLANT
DRAPE INCISE IOBAN 66X45 STRL (DRAPES) ×2 IMPLANT
DRAPE LAPAROSCOPIC ABDOMINAL (DRAPES) ×2 IMPLANT
DRAPE LG THREE QUARTER DISP (DRAPES) ×4 IMPLANT
DRAPE SLUSH/WARMER DISC (DRAPES) ×1 IMPLANT
DRAPE TABLE BACK 44X90 PK DISP (DRAPES) ×2 IMPLANT
DRAPE WARM FLUID 44X44 (DRAPE) ×2 IMPLANT
DRESSING SURGICEL FIBRLLR 1X2 (HEMOSTASIS) ×1 IMPLANT
DRSG SURGICEL FIBRILLAR 1X2 (HEMOSTASIS)
ELECT REM PT RETURN 9FT ADLT (ELECTROSURGICAL) ×4
ELECTRODE REM PT RTRN 9FT ADLT (ELECTROSURGICAL) ×2 IMPLANT
EVACUATOR SILICONE 100CC (DRAIN) ×2 IMPLANT
FLOSEAL 10ML (HEMOSTASIS) ×1 IMPLANT
GAUZE VASELINE 3X9 (GAUZE/BANDAGES/DRESSINGS) IMPLANT
GLOVE BIOGEL M STRL SZ7.5 (GLOVE) ×10 IMPLANT
GOWN STRL NON-REIN LRG LVL3 (GOWN DISPOSABLE) ×13 IMPLANT
HEMOSTAT SURGICEL 4X8 (HEMOSTASIS) IMPLANT
KIT ACCESSORY DA VINCI DISP (KITS) ×1
KIT ACCESSORY DVNC DISP (KITS) ×1 IMPLANT
KIT BASIN OR (CUSTOM PROCEDURE TRAY) ×2 IMPLANT
NS IRRIG 1000ML POUR BTL (IV SOLUTION) ×4 IMPLANT
PEN SKIN MARKING BROAD (MISCELLANEOUS) ×2 IMPLANT
PENCIL BUTTON HOLSTER BLD 10FT (ELECTRODE) ×2 IMPLANT
POSITIONER SURGICAL ARM (MISCELLANEOUS) ×2 IMPLANT
POUCH SPECIMEN RETRIEVAL 10MM (ENDOMECHANICALS) ×2 IMPLANT
SET TUBE IRRIG SUCTION NO TIP (IRRIGATION / IRRIGATOR) ×1 IMPLANT
SOLUTION ANTI FOG 6CC (MISCELLANEOUS) ×2 IMPLANT
SOLUTION ELECTROLUBE (MISCELLANEOUS) ×2 IMPLANT
SPONGE LAP 18X18 X RAY DECT (DISPOSABLE) ×1 IMPLANT
SURGIFLO W/THROMBIN 8M KIT (HEMOSTASIS) ×1 IMPLANT
SUT ETHILON 3 0 PS 1 (SUTURE) ×2 IMPLANT
SUT MNCRL AB 4-0 PS2 18 (SUTURE) ×4 IMPLANT
SUT MON AB 2-0 SH 27 (SUTURE) ×1 IMPLANT
SUT MON AB 2-0 SH27 (SUTURE) ×1 IMPLANT
SUT VIC AB 0 CT1 27 (SUTURE) ×2
SUT VIC AB 0 CT1 27XBRD ANTBC (SUTURE) ×2 IMPLANT
SUT VIC AB 0 UR5 27 (SUTURE) ×1 IMPLANT
SUT VIC AB 2-0 SH 27 (SUTURE)
SUT VIC AB 2-0 SH 27X BRD (SUTURE) ×2 IMPLANT
SUT VIC AB 4-0 RB1 27 (SUTURE)
SUT VIC AB 4-0 RB1 27XBRD (SUTURE) ×2 IMPLANT
SUT VICRYL 0 UR6 27IN ABS (SUTURE) ×4 IMPLANT
SYR BULB IRRIGATION 50ML (SYRINGE) IMPLANT
TOWEL NATURAL 10PK STERILE (DISPOSABLE) ×2 IMPLANT
TOWEL OR NON WOVEN STRL DISP B (DISPOSABLE) ×2 IMPLANT
TRAY FOLEY CATH 14FRSI W/METER (CATHETERS) ×1 IMPLANT
TRAY LAP CHOLE (CUSTOM PROCEDURE TRAY) ×2 IMPLANT
TROCAR ENDOPATH XCEL 12X100 BL (ENDOMECHANICALS) ×2 IMPLANT
TROCAR XCEL 12X100 BLDLESS (ENDOMECHANICALS) ×2 IMPLANT
TUBING INSUFFLATION 10FT LAP (TUBING) ×2 IMPLANT
WATER STERILE IRR 1500ML POUR (IV SOLUTION) ×4 IMPLANT

## 2011-08-23 NOTE — Anesthesia Postprocedure Evaluation (Signed)
Anesthesia Post Note  Patient: Victoria Mccarthy  Procedure(s) Performed:  ROBOTIC ASSISTED LAPAROSCOPIC NEPHRECTOMY - Left Robotic Assisted  Laparoscopic Partial Nephrectomy   Anesthesia type: General  Patient location: PACU  Post pain: Pain level controlled  Post assessment: Post-op Vital signs reviewed  Last Vitals:  Filed Vitals:   08/23/11 1215  BP:   Pulse:   Temp: 36.7 C  Resp:     Post vital signs: Reviewed  Level of consciousness: sedated  Complications: No apparent anesthesia complications

## 2011-08-23 NOTE — Anesthesia Preprocedure Evaluation (Signed)
Anesthesia Evaluation  Patient identified by MRN, date of birth, ID band Patient awake    Reviewed: Allergy & Precautions, H&P , NPO status , Patient's Chart, lab work & pertinent test results  History of Anesthesia Complications (+) PONV  Airway Mallampati: II TM Distance: >3 FB Neck ROM: Full    Dental No notable dental hx. (+) Teeth Intact and Dental Advisory Given   Pulmonary neg pulmonary ROS,  clear to auscultation  Pulmonary exam normal       Cardiovascular neg cardio ROS Regular Normal    Neuro/Psych PSYCHIATRIC DISORDERS (Psychotic affective disorder) Negative Neurological ROS  Negative Psych ROS   GI/Hepatic negative GI ROS, Neg liver ROS, Prior lap. Gastric banding   Endo/Other  Negative Endocrine ROS  Renal/GU negative Renal ROS  Genitourinary negative   Musculoskeletal negative musculoskeletal ROS (+)   Abdominal   Peds negative pediatric ROS (+)  Hematology negative hematology ROS (+)   Anesthesia Other Findings   Reproductive/Obstetrics negative OB ROS                           Anesthesia Physical Anesthesia Plan  ASA: II  Anesthesia Plan: General   Post-op Pain Management:    Induction: Intravenous  Airway Management Planned: Oral ETT  Additional Equipment:   Intra-op Plan:   Post-operative Plan: Extubation in OR  Informed Consent: I have reviewed the patients History and Physical, chart, labs and discussed the procedure including the risks, benefits and alternatives for the proposed anesthesia with the patient or authorized representative who has indicated his/her understanding and acceptance.   Dental advisory given  Plan Discussed with: CRNA  Anesthesia Plan Comments:         Anesthesia Quick Evaluation

## 2011-08-23 NOTE — Transfer of Care (Signed)
Immediate Anesthesia Transfer of Care Note  Patient: Victoria Mccarthy  Procedure(s) Performed:  ROBOTIC ASSISTED LAPAROSCOPIC NEPHRECTOMY - Left Robotic Assisted  Laparoscopic Partial Nephrectomy   Patient Location: PACU  Anesthesia Type: General  Level of Consciousness: sedated, patient cooperative and responds to stimulaton  Airway & Oxygen Therapy: Patient Spontanous Breathing and Patient connected to face mask oxgen  Post-op Assessment: Report given to PACU RN and Post -op Vital signs reviewed and stable  Post vital signs: Reviewed and stable  Complications: No apparent anesthesia complications

## 2011-08-23 NOTE — Op Note (Signed)
Preoperative diagnosis: Left renal neoplasm  Postoperative diagnosis: Left renal neoplasm  Procedure:  1. Left robotic-assisted laparoscopic partial nephrectomy 2. Intraoperative renal ultrasonography  Surgeon: Moody Bruins. M.D.  Assistant(s): Pecola Leisure, [place]  Anesthesia: General  Complications: None  EBL: < 100 mL  IVF:  2000 mL crystalloid  Specimens: 1. Left renal neoplasm  Disposition of specimens: Pathology  Intraoperative findings:       1. Warm renal ischemia time: 27 minutes       2. Intraoperative renal ultrasound findings: 3.2 cm solid appearing left renal neoplasm in lower pole aspect of left kidney  Drains: 1. # 15 Blake perinephric drain  Indication:  Victoria Mccarthy is a 65 y.o. year old patient with a left renal neoplasm.  After a thorough review of the management options for their renal mass, they elected to proceed with surgical treatment and the above procedure.  We have discussed the potential benefits and risks of the procedure, side effects of the proposed treatment, the likelihood of the patient achieving the goals of the procedure, and any potential problems that might occur during the procedure or recuperation. Informed consent has been obtained.   Description of procedure:  The patient was taken to the operating room and a general anesthetic was administered. The patient was given preoperative antibiotics, placed in the left modified flank position with care to pad all potential pressure points, and prepped and draped in the usual sterile fashion. Next a preoperative timeout was performed.  A site was selected on the left side of the umbilicus for placement of the camera port. This was placed using a standard open Hassan technique which allowed entry into the peritoneal cavity under direct vision and without difficulty. A 12 mm port was placed and a pneumoperitoneum established. The camera was then used to inspect the abdomen and there  was no evidence of any intra-abdominal injuries or other abnormalities except for adhesions noted in the midline. An 8 mm robotic port was placed in the left lower quadrant and laparoscopic scissors were used to take down these adhesions. The remaining abdominal ports were then placed. 8 mm robotic ports were placed in the left upper quadrant, left lower quadrant, and far left lateral abdominal wall. A 12 mm port was placed in the upper midline for laparoscopic assistance. All ports were placed under direct vision without difficulty. The surgical cart was then docked.   Utilizing the cautery scissors, the white line of Toldt was incised allowing the colon to be mobilized medially and the plane between the mesocolon and the anterior layer of Gerota's fascia to be developed and the kidney to be exposed.  The ureter and gonadal vein were identified inferiorly and the ureter was lifted anteriorly off the psoas muscle.  Dissection proceeded superiorly along the gonadal vein until the renal vein was identified.  The renal hilum was then carefully isolated with a combination of blunt and sharp dissection allowing the renal arterial and venous structures to be separated and isolated in preparation for renal hilar vessel clamping. There were two renal veins with one noted to be retroaortic on preoperative imaging.  A single renal artery was identified between the two renal veins. 12.5 g of IV mannitol was then administered.   Attention turned to the kidney and the perinephric fat surrounding the renal mass was removed and the kidney was mobilized sufficiently for exposure and resection of the renal mass.   Intraoperative renal ultrasonography was utilized with the laparoscopic ultrasound probe  to identify the renal tumor and identify the tumor margins. A 3.2 cm solid appearing left lower pole renal mass was confirmed and margins were identified and marked using ultrasound guidance.   Once the renal mass was properly  isolated, preparations were made for resection of the tumor.  Reconstructive sutures were placed into the abdomen for the renorrhaphy portion of the procedure.  The left renal artery was then clamped with bulldog clamps.  The tumor was then excised with cold scissor dissection along with an adequate visible gross margin of normal renal parenchyma. The tumor appeared to be excised without any gross violation of the tumor. The renal collecting system was entered during removal of the tumor.  A running 2-0 V-lock suture was then brought through the capsule of the kidney and run along the base of the renal defect to provide hemostasis and close any entry into the renal collecting system in a watertight fashion. Weck clips were used to secure this suture outside the renal capsule at the proximal and distal ends. Floseal was then placed into the renal defect and the renal parenchyma was closed with a 2-0 V-lock horizontal mattress capsular suture which resulted in excellent compression of the renal defect.    The bulldog clamps were then removed from the renal hilar vessel(s) and an additional 12.5 g of IV mannitol was administered. Total warm renal ischemia time was 27 minutes. The renal tumor resection site was examined. Hemostasis appeared adequate even with the pneumoperitoneum let down.   The kidney was placed back into it normal anatomic position and covered with perinephric fat as needed.  A # 15 Blake drain was then brought through the lateral lower port site and positioned in the perinephric space.  It was secured to the skin with a nylon suture. The surgical cart was undocked.  The renal tumor specimen was removed intact within an endopouch retrieval bag via the camera port sites.  The camera port site and the other 12 mm port site were then closed at the fascial level with 0-vicryl suture.  All other laparoscopic/robotic ports were removed under direct vision and the pneumoperitoneum let down with  inspection of the operative field performed and hemostasis again confirmed. All incision sites were then injected with local anesthetic and reapproximated at the skin level with 4-0 monocryl subcuticular closures.  Dermabond was applied to the skin.  The patient tolerated the procedure well and without complications.  The patient was able to be extubated and transferred to the recovery unit in satisfactory condition.  Moody Bruins MD

## 2011-08-23 NOTE — Interval H&P Note (Signed)
History and Physical Interval Note:  08/23/2011 7:08 AM  Victoria Mccarthy  has presented today for surgery, with the diagnosis of left renal neoplasm  The various methods of treatment have been discussed with the patient and family. After consideration of risks, benefits and other options for treatment, the patient has consented to  Procedure(s): ROBOTIC ASSISTED LAPAROSCOPIC NEPHRECTOMY as a surgical intervention .  The patients' history has been reviewed, patient examined, no change in status, stable for surgery.  I have reviewed the patients' chart and labs.  Questions were answered to the patient's satisfaction.     Dray Dente,LES

## 2011-08-23 NOTE — Anesthesia Procedure Notes (Signed)
Performed by: [place], Mitcheal Sweetin C

## 2011-08-23 NOTE — Progress Notes (Signed)
Day of Surgery Post op  Subjective: Patient reports pain control good.  She denies nausea and is "surprised how well I feel".    Objective: Vital signs in last 24 hours: Temp:  [97.7 F (36.5 C)-99.3 F (37.4 C)] 99.3 F (37.4 C) (12/03 1500) Pulse Rate:  [91-105] 98  (12/03 1500) Resp:  [13-25] 18  (12/03 1500) BP: (103-133)/(49-86) 112/71 mmHg (12/03 1500) SpO2:  [93 %-98 %] 98 % (12/03 1500)  Intake/Output this shift: Total I/O In: 2400 [I.V.:2400] Out: 1535 [Urine:1350; Drains:85; Blood:100]  Physical Exam:  General:alert, cooperative and no distress Cardiovascular: rrr Lungs: decrease BS bases Dressings C/D/I  Lab Results:  Basename 08/23/11 1110  HGB 12.0  HCT 35.0*   BMET  Basename 08/23/11 1110  NA 130*  K 3.6  CL 93*  CO2 27  GLUCOSE 161*  BUN 7  CREATININE 0.76  CALCIUM 8.7     Results for orders placed during the hospital encounter of 08/20/11  SURGICAL PCR SCREEN     Status: Abnormal   Collection Time   08/20/11 10:31 AM      Component Value Range Status Comment   MRSA, PCR NEGATIVE  NEGATIVE  Final    Staphylococcus aureus POSITIVE (*) NEGATIVE  Final     Studies/Results: No results found.  Assessment/Plan: Day of Surgery, Procedure(s) (LRB): ROBOTIC ASSISTED LAPAROSCOPIC NEPHRECTOMY (Left)  Doing well Bed rest  I.S. In a.m. Will plan ambulation, d/c foley, and increase diet    LOS: 0 days   YARBROUGH,Lanijah Warzecha G. 08/23/2011, 4:45 PM

## 2011-08-23 NOTE — OR Nursing (Signed)
Left renal artery clamp time [phone]

## 2011-08-24 ENCOUNTER — Encounter (HOSPITAL_COMMUNITY): Payer: Self-pay | Admitting: Urology

## 2011-08-24 LAB — HEMOGLOBIN AND HEMATOCRIT, BLOOD
HCT: 32.5 % — ABNORMAL LOW (ref 36.0–46.0)
Hemoglobin: 10.9 g/dL — ABNORMAL LOW (ref 12.0–15.0)

## 2011-08-24 LAB — BASIC METABOLIC PANEL
CO2: 26 mEq/L (ref 19–32)
Chloride: 96 mEq/L (ref 96–112)
Glucose, Bld: 147 mg/dL — ABNORMAL HIGH (ref 70–99)
Sodium: 132 mEq/L — ABNORMAL LOW (ref 135–145)

## 2011-08-24 MED ORDER — OXYCODONE-ACETAMINOPHEN 5-325 MG PO TABS
1.0000 | ORAL_TABLET | Freq: Four times a day (QID) | ORAL | Status: DC | PRN
  Administered 2011-08-24 – 2011-08-25 (×5): 2 via ORAL
  Filled 2011-08-24 (×6): qty 2

## 2011-08-24 NOTE — Progress Notes (Signed)
1 Day Post-Op Subjective: Patient reports back pain and left flank soreness.  She has chronic back pain and states this is at her normal level of discomfort.  She denies N/V.  Tol clears last night.  +flatus.    Objective: Vital signs in last 24 hours: Temp:  [97.7 F (36.5 C)-99.9 F (37.7 C)] 99.7 F (37.6 C) (12/04 0548) Pulse Rate:  [91-100] 93  (12/04 0548) Resp:  [13-25] 16  (12/04 0548) BP: (103-127)/(49-76) 125/74 mmHg (12/04 0548) SpO2:  [93 %-98 %] 93 % (12/04 0548) Weight:  [81.285 kg (179 lb 3.2 oz)] 179 lb 3.2 oz (81.285 kg) (12/03 1702)  Intake/Output from previous day: 12/03 0701 - 12/04 0700 In: 5252.5 [P.O.:800; I.V.:4402.5; IV Piggyback:50] Out: 5200 [Urine:4900; Drains:200; Blood:100]  Physical Exam:  General: A/O; resting comfortably Cardiovascular: RRR Lungs: CTA GI: soft; mildly tender; ND; +BS Incisions: C/D/I Urine: clear/yellow Extremities: SCDs in place  Lab Results:  Basename 08/24/11 0515 08/23/11 1110  HGB 10.9* 12.0  HCT 32.5* 35.0*   BMET  Basename 08/24/11 0515 08/23/11 1110  NA 132* 130*  K 3.9 3.6  CL 96 93*  CO2 26 27  GLUCOSE 147* 161*  BUN 8 7  CREATININE 1.00 0.76  CALCIUM 8.4 8.7   Assessment/Plan: 1 Day Post-Op, Procedure(s) (LRB): ROBOTIC ASSISTED LAPAROSCOPIC NEPHRECTOMY (Left)  Doing well POD #1 Labs stable OOB, ambulate today Leave foley until ambulation increased Slowly advance diet I.S. Oral pain meds     LOS: 1 day   YARBROUGH,Kahlin Mark G. 08/24/2011, 7:26 AM

## 2011-08-24 NOTE — Progress Notes (Signed)
Patient ID: Victoria Mccarthy, female   DOB: 11-Jul-1946, 65 y.o.   MRN: [id number] 1 Day Post-Op Subjective: The patient is doing well.  No nausea or vomiting. Pain is adequately controlled.  Objective: Vital signs in last 24 hours: Temp:  [97.7 F (36.5 C)-99.9 F (37.7 C)] 99.7 F (37.6 C) (12/04 0548) Pulse Rate:  [91-100] 93  (12/04 0548) Resp:  [13-25] 16  (12/04 0548) BP: (103-127)/(49-76) 125/74 mmHg (12/04 0548) SpO2:  [93 %-98 %] 93 % (12/04 0548) Weight:  [81.285 kg (179 lb 3.2 oz)] 179 lb 3.2 oz (81.285 kg) (12/03 1702)  Intake/Output from previous day: 12/03 0701 - 12/04 0700 In: 5252.5 [P.O.:800; I.V.:4402.5; IV Piggyback:50] Out: 5200 [Urine:4900; Drains:200; Blood:100] Intake/Output this shift:    Physical Exam:  General: Alert and oriented. CV: RRR Lungs: Clear bilaterally. GI: Soft, Nondistended. Incisions: Clean and dry. Urine: Clear Extremities: Nontender, no erythema, no edema.  Lab Results:  Basename 08/24/11 0515 08/23/11 1110  HGB 10.9* 12.0  HCT 32.5* 35.0*          Basename 08/24/11 0515 08/23/11 1110 08/20/11 1000  CREATININE 1.00 0.76 0.61           Results for orders placed during the hospital encounter of 08/23/11 (from the past 24 hour(s))  BASIC METABOLIC PANEL     Status: Abnormal   Collection Time   08/23/11 11:10 AM      Component Value Range   Sodium 130 (*) 135 - 145 (mEq/L)   Potassium 3.6  3.5 - 5.1 (mEq/L)   Chloride 93 (*) 96 - 112 (mEq/L)   CO2 27  19 - 32 (mEq/L)   Glucose, Bld 161 (*) 70 - 99 (mg/dL)   BUN 7  6 - 23 (mg/dL)   Creatinine, Ser 4.09  0.50 - 1.10 (mg/dL)   Calcium 8.7  8.4 - 81.1 (mg/dL)   GFR calc non Af Amer 88 (*) >90 (mL/min)   GFR calc Af Amer >90  >90 (mL/min)  HEMOGLOBIN AND HEMATOCRIT, BLOOD     Status: Abnormal   Collection Time   08/23/11 11:10 AM      Component Value Range   Hemoglobin 12.0  12.0 - 15.0 (g/dL)   HCT 91.4 (*) 78.2 - 46.0 (%)  BASIC METABOLIC PANEL     Status: Abnormal   Collection Time   08/24/11  5:15 AM      Component Value Range   Sodium 132 (*) 135 - 145 (mEq/L)   Potassium 3.9  3.5 - 5.1 (mEq/L)   Chloride 96  96 - 112 (mEq/L)   CO2 26  19 - 32 (mEq/L)   Glucose, Bld 147 (*) 70 - 99 (mg/dL)   BUN 8  6 - 23 (mg/dL)   Creatinine, Ser 9.56  0.50 - 1.10 (mg/dL)   Calcium 8.4  8.4 - 21.3 (mg/dL)   GFR calc non Af Amer 59 (*) >90 (mL/min)   GFR calc Af Amer 68 (*) >90 (mL/min)  HEMOGLOBIN AND HEMATOCRIT, BLOOD     Status: Abnormal   Collection Time   08/24/11  5:15 AM      Component Value Range   Hemoglobin 10.9 (*) 12.0 - 15.0 (g/dL)   HCT 08.6 (*) 57.8 - 46.0 (%)    Assessment/Plan: POD# 1 s/p robotic partial nephrectomy.  1) Ambulate, Incentive spirometry 2) Advance diet as tolerated 3) Transition to oral pain medication 4) Dulcolax suppository 5) D/C urethral catheter   Moody Bruins. MD  LOS: 1 day   Samie Barclift,LES 08/24/2011, 7:23 AM

## 2011-08-24 NOTE — Progress Notes (Signed)
Chart reviewed and UR completed. 

## 2011-08-25 LAB — BASIC METABOLIC PANEL
CO2: 27 mEq/L (ref 19–32)
Calcium: 8.8 mg/dL (ref 8.4–10.5)
Calcium: 9.3 mg/dL (ref 8.4–10.5)
Creatinine, Ser: 0.95 mg/dL (ref 0.50–1.10)
GFR calc Af Amer: 71 mL/min — ABNORMAL LOW (ref >90)
GFR calc Af Amer: 72 mL/min — ABNORMAL LOW (ref >90)
GFR calc non Af Amer: 61 mL/min — ABNORMAL LOW (ref >90)
GFR calc non Af Amer: 62 mL/min — ABNORMAL LOW (ref >90)
Potassium: 3.9 mEq/L (ref 3.5–5.1)
Sodium: 128 mEq/L — ABNORMAL LOW (ref 135–145)
Sodium: 130 mEq/L — ABNORMAL LOW (ref 135–145)

## 2011-08-25 LAB — CREATININE, FLUID (PLEURAL, PERITONEAL, JP DRAINAGE): Creat, Fluid: 1 mg/dL

## 2011-08-25 LAB — HEMOGLOBIN AND HEMATOCRIT, BLOOD
HCT: 32.5 % — ABNORMAL LOW (ref 36.0–46.0)
Hemoglobin: 10.8 g/dL — ABNORMAL LOW (ref 12.0–15.0)

## 2011-08-25 MED ORDER — OXYCODONE-ACETAMINOPHEN 10-325 MG PO TABS: 1.0000 | ORAL_TABLET | ORAL | Status: DC

## 2011-08-25 NOTE — Progress Notes (Signed)
Patient ID: Victoria Mccarthy, female   DOB: 23-Feb-1946, 65 y.o.   MRN: [id number] 2 Days Post-Op Subjective: The patient is doing well.  No nausea or vomiting. Pain is adequately controlled with Percocet. Tolerating regular diet and passing flatus.  Objective: Vital signs in last 24 hours: Temp:  [98.3 F (36.8 C)-99.2 F (37.3 C)] 99.2 F (37.3 C) (12/05 0604) Pulse Rate:  [95-104] 95  (12/05 0604) Resp:  [18] 18  (12/05 0604) BP: (109-133)/(71-80) 109/71 mmHg (12/05 0604) SpO2:  [93 %-97 %] 93 % (12/05 0604)  Intake/Output from previous day: 12/04 0701 - 12/05 0700 In: 1970 [P.O.:1040; I.V.:930] Out: 4440 [Urine:4375; Drains:65] Intake/Output this shift: Total I/O In: 240 [P.O.:240] Out: 1525 [Urine:1525]  Physical Exam:  General: Alert and oriented. CV: RRR Lungs: Clear bilaterally. GI: Soft, Nondistended. Incisions: Clean and dry. Extremities: Nontender, no erythema, no edema.  Lab Results:  Basename 08/25/11 0430 08/24/11 0515 08/23/11 1110  HGB 10.8* 10.9* 12.0  HCT 32.5* 32.5* 35.0*          Basename 08/25/11 0430 08/24/11 0515 08/23/11 1110  CREATININE 0.97 1.00 0.76           Results for orders placed during the hospital encounter of 08/23/11 (from the past 24 hour(s))  HEMOGLOBIN AND HEMATOCRIT, BLOOD     Status: Abnormal   Collection Time   08/25/11  4:30 AM      Component Value Range   Hemoglobin 10.8 (*) 12.0 - 15.0 (g/dL)   HCT 11.9 (*) 14.7 - 46.0 (%)  BASIC METABOLIC PANEL     Status: Abnormal   Collection Time   08/25/11  4:30 AM      Component Value Range   Sodium 128 (*) 135 - 145 (mEq/L)   Potassium 3.9  3.5 - 5.1 (mEq/L)   Chloride 95 (*) 96 - 112 (mEq/L)   CO2 25  19 - 32 (mEq/L)   Glucose, Bld 126 (*) 70 - 99 (mg/dL)   BUN 11  6 - 23 (mg/dL)   Creatinine, Ser 8.29  0.50 - 1.10 (mg/dL)   Calcium 8.8  8.4 - 56.2 (mg/dL)   GFR calc non Af Amer 61 (*) >90 (mL/min)   GFR calc Af Amer 71 (*) >90 (mL/min)    Pathology:  pT1a Nx Mx,  Fuhrman Grade I clear cell renal cell carcinoma with negative surgical margins    Assessment/Plan: POD# 2 s/p robotic partial nephrectomy.  1) Ambulate, Incentive spirometry 2) Check drain creatinine level 3) SL IVF 4) Recheck BMP to ensure hyponatremia improving 5) Will likely D/C home later today 6) Discussed pathology report  Moody Bruins. MD   LOS: 2 days   Laquan Beier,LES 08/25/2011, 6:54 AM

## 2011-08-25 NOTE — Discharge Summary (Signed)
  Date of admission: 08/23/2011  Date of discharge: 08/25/2011  Admission diagnosis: left renal neoplasm  Discharge diagnosis: same  Secondary diagnoses: anxiety, back pain, depression, GERD, and arthritis.  History and Physical: For full details, please see admission history and physical. Briefly, Victoria Mccarthy is a 65 y.o. year old patient who during workup for chronic back pain underwent a CT scan which revealed a left renal neoplasm suspicious for malignancy.  Secondary to this, robotic resection of tumor was scheduled.  Hospital Course:  Pt was admitted and taken to the OR on 08/23/11 for robotic assisted left partial nephrectomy.  She tolerated the procedure well and was hemodynamically stable immediately post op.  She was extubated without complication and woke up from anesthesia neurologically intact.  The pt's post op course has progressed as expected.  She has remained afebrile with stable vitals.  Her foley was successfully d/c'd and she has been able to ambulate without difficulty.  She is tolerating a regular diet.  Her JP was d/c'd on POD #2.  Her pain has also been well controlled.  She is doing well and felt ready to d/c home.     PATH: pT1a Nx Mx, Fuhrman Grade I clear cell renal cell carcinoma with negative surgical margins     Laboratory values:  Basename 08/25/11 0430 08/24/11 0515 08/23/11 1110  HGB 10.8* 10.9* 12.0  HCT 32.5* 32.5* 35.0*    Basename 08/25/11 1041 08/25/11 0430  CREATININE 0.95 0.97    Disposition: Home  Discharge instruction: The patient was instructed to be ambulatory but told to refrain from heavy lifting, strenuous activity, or driving.   Discharge medications:  Medication List  As of 08/25/2011  1:13 PM   CONTINUE taking these medications         ALPRAZolam 1 MG tablet   Commonly known as: XANAX      busPIRone 15 MG tablet   Commonly known as: BUSPAR      cholestyramine 4 GM/DOSE powder   Commonly known as: QUESTRAN     cyclobenzaprine 10 MG tablet   Commonly known as: FLEXERIL      hydrOXYzine 25 MG capsule   Commonly known as: VISTARIL      olanzapine-FLUoxetine 6-50 MG per capsule   Commonly known as: SYMBYAX      oxyCODONE-acetaminophen 10-325 MG per tablet   Commonly known as: PERCOCET   Take 1 tablet by mouth every 4 (four) hours.      rOPINIRole 0.5 MG tablet   Commonly known as: REQUIP      solifenacin 10 MG tablet   Commonly known as: VESICARE          Where to get your medications    These are the prescriptions that you need to pick up.   You may get these medications from any pharmacy.         oxyCODONE-acetaminophen 10-325 MG per tablet            Followup:  Follow-up Information    Follow up with Crecencio Mc, MD on 09/22/2011. (at 12:15)    Contact information:   [address] [place] [facility] [place] [address] [phone]

## 2011-09-21 DIAGNOSIS — C801 Malignant (primary) neoplasm, unspecified: Secondary | ICD-10-CM

## 2011-09-21 HISTORY — DX: Malignant (primary) neoplasm, unspecified: C80.1

## 2011-11-29 ENCOUNTER — Other Ambulatory Visit: Payer: Self-pay | Admitting: Gastroenterology

## 2011-11-30 ENCOUNTER — Encounter: Payer: Self-pay | Admitting: Gastroenterology

## 2011-11-30 NOTE — Telephone Encounter (Signed)
Please schedule a routine f/u appt with [place], non-urgent. You can send letter if easiest.

## 2011-11-30 NOTE — Telephone Encounter (Signed)
OV scheduled for 12/08/11 w/ AS

## 2011-12-07 ENCOUNTER — Encounter: Payer: Self-pay | Admitting: Internal Medicine

## 2011-12-08 ENCOUNTER — Encounter: Payer: Self-pay | Admitting: Gastroenterology

## 2011-12-08 ENCOUNTER — Ambulatory Visit (INDEPENDENT_AMBULATORY_CARE_PROVIDER_SITE_OTHER): Payer: Medicaid Other | Admitting: Gastroenterology

## 2011-12-08 VITALS — BP 130/82 | HR 83 | Temp 98.4°F | Ht 65.5 in | Wt 175.4 lb

## 2011-12-08 DIAGNOSIS — R197 Diarrhea, unspecified: Secondary | ICD-10-CM

## 2011-12-08 DIAGNOSIS — R1319 Other dysphagia: Secondary | ICD-10-CM

## 2011-12-08 NOTE — Progress Notes (Signed)
Referring Provider: Kirk Ruths, MD Primary Care Physician:  Kirk Ruths, MD, MD Primary Gastroenterologist: Dr. Jena Gauss   Chief Complaint  Patient presents with  . Follow-up    HPI:   Ms. Hannon presents today in f/u with a hx of chronic dysphagia, GERD, diarrhea. Please see multiple dilations in past via EGD. Most recent in July 2011. She was last seen in our office in Aug 2011 and referred to [facility] for further evaluation. (Historically, BPE in 2011 showed obstruction of barium tablet, no obvious dysmotility). Yet, she states she did not go, reason unclear. She has been maintained in the past on Questran with excellent results.    No dysphagia. Denies reflux. No N/V, no abdominal pain. Diarrhea: stays under control as long as taking Questran each evening. No rectal bleeding.   Last TCS Jan 2011, due for routine screening likely in 2021. I do not see any past history of polyps upon review of EMR.   Past Medical History  Diagnosis Date  . PONV (postoperative nausea and vomiting)   . Arthritis     chronic back pain  . Diarrhea     incontinent stools  . Cancer Jan 2013    kidney cancer, left, s/p partial nephrectomy  . Nocturia   . Urinary frequency   . Anemia   . Depression   . Psychotic affective disorder     "psychotic delusions" "I cut myself"   . Restless leg syndrome   . Rheumatic fever   . MVP (mitral valve prolapse)   . Bipolar disorder   . Osteoporosis   . Osteoarthritis   . Diverticulitis     treated at least 5 times in past, hospitalized twice    Past Surgical History  Procedure Date  . Tonsillectomy   . Abdominal hysterectomy   . Cholecystectomy   . Carpell tunnell     rt wrist  x2  . Nissen fundoplication   . Rotator cuff repair   . Robot assisted laparoscopic nephrectomy 08/23/2011    Procedure: ROBOTIC ASSISTED LAPAROSCOPIC NEPHRECTOMY;  Surgeon: Crecencio Mc, MD;  Location: [facility];  Service: Urology;  Laterality: Left;  Left Robotic Assisted   Laparoscopic Partial Nephrectomy   . Esophagogastroduodenoscopy 04/06/2010    intact Nissen fundoplication S/P dilation, somewhat baggy atonic appearing esophagus, 58-F dilation  . Esophagogastroduodenoscopy 1/11    nomral esophagus s/p 54-F Maloney dilation, normal/intact Nissen fundoplication, diffuse patchy erythema and erosions likely NSAID/ASA effect with benign biopsy  . Colonoscopy 5/02    pancolonic deverticula, internal hemorrhoids  . Colonoscopy 1/11    single external hemorrhoidal tag and anal papilla otherwise normal rectum, pancolonic diverticula, s/p sigmoid biopsy and stool sampling all unremarkable    Current Outpatient Prescriptions  Medication Sig Dispense Refill  . ALPRAZolam (XANAX) 1 MG tablet Take 1 mg by mouth 4 (four) times daily.       . cholestyramine (QUESTRAN) 4 GM/DOSE powder 1/2 SCOOP DAILY 2 HOURS BEFORE AND AFTER ALL OTHER MEDS FOR STOOLS.  30 packet  3  . hydrOXYzine (VISTARIL) 25 MG capsule Take 25 mg by mouth 3 (three) times daily.       Marland Kitchen olanzapine-FLUoxetine (SYMBYAX) 6-50 MG per capsule Take 2 capsules by mouth at bedtime.        Marland Kitchen oxyCODONE-acetaminophen (PERCOCET) 10-325 MG per tablet Take 1 tablet by mouth every 4 (four) hours.  30 tablet  0  . rOPINIRole (REQUIP) 0.5 MG tablet Take 0.5 mg by mouth at bedtime.        Marland Kitchen  solifenacin (VESICARE) 10 MG tablet Take 5 mg by mouth at bedtime.        . busPIRone (BUSPAR) 15 MG tablet Take 15 mg by mouth 3 (three) times daily.       . cholestyramine (QUESTRAN) 4 GM/DOSE powder        . cyclobenzaprine (FLEXERIL) 10 MG tablet Take 20 mg by mouth at bedtime.          Allergies as of 12/08/2011 - Review Complete 12/08/2011  Allergen Reaction Noted  . Shellfish allergy Anaphylaxis 08/11/2011  . Codeine  09/26/2009  . Haloperidol lactate  09/26/2009  . Hydromorphone hcl Nausea And Vomiting   . Iodine  09/26/2009  . Propoxyphene n-acetaminophen  09/26/2009  . Latex Rash 08/11/2011    No family history on  file.  History   Social History  . Marital Status: Divorced    Spouse Name: N/A    Number of Children: N/A  . Years of Education: N/A   Social History Main Topics  . Smoking status: Never Smoker   . Smokeless tobacco: None  . Alcohol Use: No  . Drug Use: No  . Sexually Active:    Other Topics Concern  . None   Social History Narrative  . None    Review of Systems: Gen: Denies fever, chills, anorexia. Denies fatigue, weakness, weight loss.  CV: Denies chest pain, palpitations, syncope, peripheral edema, and claudication. Resp: Denies dyspnea at rest, cough, wheezing, coughing up blood, and pleurisy. GI: Denies vomiting blood, jaundice, and fecal incontinence.   Denies dysphagia or odynophagia. Derm: Denies rash, itching, dry skin Psych: Denies depression, anxiety, memory loss, confusion. No homicidal or suicidal ideation.  Heme: Denies bruising, bleeding, and enlarged lymph nodes.  Physical Exam: BP 130/82  Pulse 83  Temp(Src) 98.4 F (36.9 C) (Temporal)  Ht 5' 5.5" (1.664 m)  Wt 175 lb 6.4 oz (79.561 kg)  BMI 28.74 kg/m2 General:   Alert and oriented. No distress noted. Pleasant and cooperative.  Head:  Normocephalic and atraumatic. Eyes:  Conjuctiva clear without scleral icterus. Mouth:  Oral mucosa pink and moist. Good dentition. No lesions. Neck:  Supple, without mass or thyromegaly. Heart:  S1, S2 present without murmurs, rubs, or gallops. Regular rate and rhythm. Abdomen:  +BS, soft, non-tender and non-distended. No rebound or guarding. No HSM or masses noted. Msk:  Symmetrical without gross deformities. Normal posture. Extremities:  Without edema. Neurologic:  Alert and  oriented x4;  grossly normal neurologically. Skin:  Intact without significant lesions or rashes. Cervical Nodes:  No significant cervical adenopathy. Psych:  Alert and cooperative. Normal mood and affect.

## 2011-12-08 NOTE — Patient Instructions (Signed)
We will see you back in 2 years or sooner as needed.  Continue taking Questran for diarrhea. Contact us if you have any further problems swallowing.

## 2011-12-09 ENCOUNTER — Encounter: Payer: Self-pay | Admitting: Gastroenterology

## 2011-12-09 NOTE — Assessment & Plan Note (Signed)
Hx of significant dysphagia in past with multiple EGD/EDs. Did not see [facility] as originally referred. However, patient returns today devoid of any upper GI symptoms. She is down 8 lbs since Aug 2011, but she reports a good appetite and no dysphagia. As she is doing so well, we will see her back in 2 years or sooner if indicated. She was given strict instructions to call our office with any recurrence of symptoms.   2 year return F/U prn in interim

## 2011-12-09 NOTE — Assessment & Plan Note (Signed)
Hx of loose stools, incontinence. Using off-label Questran with excellent results. Will refill. Last TCS in Jan 2011. I do not see any hx of polyps in chart, and barring any new findings she should have her next screening in 2021. Return in 2 years for routine follow-up, pt to call if any recurrence of diarrhea, incontinence.

## 2011-12-09 NOTE — Progress Notes (Signed)
Faxed to PCP

## 2012-01-19 ENCOUNTER — Other Ambulatory Visit: Payer: Self-pay

## 2012-01-19 MED ORDER — CHOLESTYRAMINE 4 GM/DOSE PO POWD: 2.0000 g | Freq: Every day | ORAL | Status: DC

## 2012-02-23 ENCOUNTER — Other Ambulatory Visit: Payer: Self-pay | Admitting: Orthopedic Surgery

## 2012-02-23 DIAGNOSIS — M545 Low back pain: Secondary | ICD-10-CM

## 2012-03-06 ENCOUNTER — Ambulatory Visit
Admission: RE | Admit: 2012-03-06 | Discharge: 2012-03-06 | Disposition: A | Discharge status: Home / Self Care | Payer: Medicaid Other | Source: Ambulatory Visit | Attending: Orthopedic Surgery | Admitting: Orthopedic Surgery

## 2012-03-06 ENCOUNTER — Other Ambulatory Visit: Payer: Self-pay | Admitting: Orthopedic Surgery

## 2012-03-06 VITALS — BP 143/84 | HR 93

## 2012-03-06 DIAGNOSIS — M545 Low back pain: Secondary | ICD-10-CM

## 2012-03-06 DIAGNOSIS — M961 Postlaminectomy syndrome, not elsewhere classified: Secondary | ICD-10-CM

## 2012-03-06 MED ORDER — FENTANYL CITRATE 0.05 MG/ML IJ SOLN
25.0000 ug | INTRAMUSCULAR | Status: DC | PRN
  Administered 2012-03-06: 100 ug via INTRAVENOUS

## 2012-03-06 MED ORDER — SODIUM CHLORIDE 0.9 % IV SOLN
Freq: Once | INTRAVENOUS | Status: AC
  Administered 2012-03-06: 10:00:00 via INTRAVENOUS

## 2012-03-06 MED ORDER — MIDAZOLAM HCL 2 MG/2ML IJ SOLN
1.0000 mg | INTRAMUSCULAR | Status: DC | PRN
  Administered 2012-03-06 (×2): 1 mg via INTRAVENOUS

## 2012-03-06 MED ORDER — CEFAZOLIN SODIUM 1-5 GM-% IV SOLN: 1.0000 g | Freq: Three times a day (TID) | INTRAVENOUS | Status: DC

## 2012-03-06 MED ORDER — KETOROLAC TROMETHAMINE 30 MG/ML IJ SOLN
30.0000 mg | Freq: Once | INTRAMUSCULAR | Status: AC
  Administered 2012-03-06: 30 mg via INTRAVENOUS

## 2012-03-06 MED ORDER — OXYCODONE-ACETAMINOPHEN 5-325 MG PO TABS
2.0000 | ORAL_TABLET | Freq: Once | ORAL | Status: AC
  Administered 2012-03-06: 2 via ORAL

## 2012-03-06 MED ORDER — HYDROXYZINE HCL 50 MG/ML IM SOLN
50.0000 mg | Freq: Once | INTRAMUSCULAR | Status: AC
  Administered 2012-03-06: 50 mg via INTRAMUSCULAR

## 2012-03-06 MED ORDER — IOHEXOL 180 MG/ML  SOLN: 7.0000 mL | Freq: Once | INTRAMUSCULAR | Status: AC | PRN

## 2012-03-06 MED ORDER — CEFAZOLIN SODIUM 1-5 GM-% IV SOLN
1.0000 g | Freq: Once | INTRAVENOUS | Status: AC
  Administered 2012-03-06: 1 g via INTRAVENOUS

## 2012-03-06 MED ORDER — MEPERIDINE HCL 100 MG/ML IJ SOLN
75.0000 mg | Freq: Once | INTRAMUSCULAR | Status: AC
  Administered 2012-03-06: 75 mg via INTRAMUSCULAR

## 2012-03-06 NOTE — Discharge Instructions (Signed)
Discogram Post Procedure Discharge Instructions ° °1. May resume a regular diet and any medications that you routinely take (including pain medications). °2. No driving day of procedure. °3. Upon discharge go home and rest for at least 4 hours.  May use an ice pack as needed to injection sites on back.  Ice to back 30 minutes on and 30 minutes off, all day. °4. May remove bandades later, today. °5. It is not unusual to be sore for several days after this procedure. ° ° ° °Please contact our office at [phone] for the following symptoms: ° °· Fever greater than 100 degrees °· Increased swelling, pain, or redness at injection site. ° ° °Thank you for visiting [facility]. ° ° °

## 2012-05-25 ENCOUNTER — Telehealth: Payer: Self-pay | Admitting: Internal Medicine

## 2012-05-25 NOTE — Telephone Encounter (Signed)
The Pt is going to call [facility] to see if they will give her more. I told her we could make her an appointment to see her if she would like.

## 2012-05-25 NOTE — Telephone Encounter (Signed)
We don't prescribe nitroglycerin tablets. I'm not sure what she is referring to?

## 2012-05-25 NOTE — Telephone Encounter (Signed)
I called the drug store to see who order the medication to start with and they said that is was [facility]. I told her mother that we did not give her the medication.

## 2012-05-25 NOTE — Telephone Encounter (Signed)
Pt came to front window requesting that we call in her prescription of nitroglycerin tablets that we had put her on. She was last seen in office in March 2013 by AS. She uses the [facility] in [place].

## 2012-05-25 NOTE — Telephone Encounter (Signed)
I'm sorry, I was thinking she was referring to nitro for cardiac issues. I saw one note in the remote past where she had taken nitroglycerin for esophageal spasms. However, she was not having any issues when I saw her in march. Needs OV before prescribing.

## 2012-09-25 ENCOUNTER — Other Ambulatory Visit: Payer: Self-pay

## 2012-09-25 MED ORDER — CHOLESTYRAMINE 4 GM/DOSE PO POWD: 2.0000 g | Freq: Every day | ORAL | Status: DC

## 2012-11-06 ENCOUNTER — Emergency Department (HOSPITAL_COMMUNITY)
Admission: EM | Admit: 2012-11-06 | Discharge: 2012-11-06 | Disposition: A | Discharge status: Home / Self Care | Payer: Medicaid Other | Attending: Emergency Medicine | Admitting: Emergency Medicine

## 2012-11-06 ENCOUNTER — Encounter (HOSPITAL_COMMUNITY): Payer: Self-pay | Admitting: *Deleted

## 2012-11-06 DIAGNOSIS — Z8679 Personal history of other diseases of the circulatory system: Secondary | ICD-10-CM | POA: Insufficient documentation

## 2012-11-06 DIAGNOSIS — Z8739 Personal history of other diseases of the musculoskeletal system and connective tissue: Secondary | ICD-10-CM | POA: Insufficient documentation

## 2012-11-06 DIAGNOSIS — Z7982 Long term (current) use of aspirin: Secondary | ICD-10-CM | POA: Insufficient documentation

## 2012-11-06 DIAGNOSIS — G2581 Restless legs syndrome: Secondary | ICD-10-CM | POA: Insufficient documentation

## 2012-11-06 DIAGNOSIS — F3289 Other specified depressive episodes: Secondary | ICD-10-CM | POA: Insufficient documentation

## 2012-11-06 DIAGNOSIS — Z905 Acquired absence of kidney: Secondary | ICD-10-CM | POA: Insufficient documentation

## 2012-11-06 DIAGNOSIS — Z79899 Other long term (current) drug therapy: Secondary | ICD-10-CM | POA: Insufficient documentation

## 2012-11-06 DIAGNOSIS — R350 Frequency of micturition: Secondary | ICD-10-CM | POA: Insufficient documentation

## 2012-11-06 DIAGNOSIS — Z8659 Personal history of other mental and behavioral disorders: Secondary | ICD-10-CM | POA: Insufficient documentation

## 2012-11-06 DIAGNOSIS — Z8719 Personal history of other diseases of the digestive system: Secondary | ICD-10-CM | POA: Insufficient documentation

## 2012-11-06 DIAGNOSIS — Z862 Personal history of diseases of the blood and blood-forming organs and certain disorders involving the immune mechanism: Secondary | ICD-10-CM | POA: Insufficient documentation

## 2012-11-06 DIAGNOSIS — R42 Dizziness and giddiness: Secondary | ICD-10-CM | POA: Insufficient documentation

## 2012-11-06 DIAGNOSIS — R040 Epistaxis: Secondary | ICD-10-CM | POA: Insufficient documentation

## 2012-11-06 DIAGNOSIS — F329 Major depressive disorder, single episode, unspecified: Secondary | ICD-10-CM | POA: Insufficient documentation

## 2012-11-06 DIAGNOSIS — F39 Unspecified mood [affective] disorder: Secondary | ICD-10-CM | POA: Insufficient documentation

## 2012-11-06 DIAGNOSIS — Z85528 Personal history of other malignant neoplasm of kidney: Secondary | ICD-10-CM | POA: Insufficient documentation

## 2012-11-06 LAB — PROTIME-INR: Prothrombin Time: 12.8 seconds (ref 11.6–15.2)

## 2012-11-06 LAB — CBC
Hemoglobin: 10.7 g/dL — ABNORMAL LOW (ref 12.0–15.0)
MCH: 30.7 pg (ref 26.0–34.0)
MCHC: 33.1 g/dL (ref 30.0–36.0)

## 2012-11-06 MED ORDER — SULFAMETHOXAZOLE-TRIMETHOPRIM 800-160 MG PO TABS: 1.0000 | ORAL_TABLET | Freq: Two times a day (BID) | ORAL | Status: DC

## 2012-11-06 MED ORDER — SULFAMETHOXAZOLE-TMP DS 800-160 MG PO TABS
1.0000 | ORAL_TABLET | Freq: Once | ORAL | Status: AC
  Administered 2012-11-06: 1 via ORAL
  Filled 2012-11-06: qty 1

## 2012-11-06 NOTE — ED Provider Notes (Signed)
History    This chart was scribed for Gilda Crease, MD by Gerlean Ren, ED Scribe. This patient was seen in room APA09/APA09 and the patient's care was started at 7:23 PM    CSN: [id number]  Arrival date & time 11/06/12  4098   First MD Initiated Contact with Patient 11/06/12 1920      Chief Complaint  Patient presents with  . Epistaxis     The history is provided by the patient. No language interpreter was used.  Victoria Mccarthy is a 67 y.o. female who presents to the Emergency Department complaining of constant epistaxis from bilateral nares with sudden onset 3:30 PM today.  No h/o epistaxis, no trauma to nose.  No blood thinners or Aspirin.  Pt also reports epistaxis yesterday that bled less and resolved.  Pt denies any current pain or dyspnea.  Mild dizziness.  Pt denies tobacco and alcohol use.     Past Medical History  Diagnosis Date  . PONV (postoperative nausea and vomiting)   . Arthritis     chronic back pain  . Diarrhea     incontinent stools  . Nocturia   . Urinary frequency   . Anemia   . Depression   . Psychotic affective disorder     "psychotic delusions" "I cut myself"   . Restless leg syndrome   . Rheumatic fever   . MVP (mitral valve prolapse)   . Bipolar disorder   . Osteoporosis   . Osteoarthritis   . Diverticulitis     treated at least 5 times in past, hospitalized twice  . Cancer Jan 2013    kidney cancer, left, s/p partial nephrectomy    Past Surgical History  Procedure Laterality Date  . Tonsillectomy    . Abdominal hysterectomy    . Cholecystectomy    . Carpell tunnell      rt wrist  x2  . Nissen fundoplication    . Rotator cuff repair    . Robot assisted laparoscopic nephrectomy  08/23/2011    Procedure: ROBOTIC ASSISTED LAPAROSCOPIC NEPHRECTOMY;  Surgeon: Crecencio Mc, MD;  Location: [facility];  Service: Urology;  Laterality: Left;  Left Robotic Assisted  Laparoscopic Partial Nephrectomy   . Esophagogastroduodenoscopy  04/06/2010   intact Nissen fundoplication S/P dilation, somewhat baggy atonic appearing esophagus, 58-F dilation  . Esophagogastroduodenoscopy  1/11    nomral esophagus s/p 54-F Maloney dilation, normal/intact Nissen fundoplication, diffuse patchy erythema and erosions likely NSAID/ASA effect with benign biopsy  . Colonoscopy  5/02    pancolonic deverticula, internal hemorrhoids  . Colonoscopy  1/11    single external hemorrhoidal tag and anal papilla otherwise normal rectum, pancolonic diverticula, s/p sigmoid biopsy and stool sampling all unremarkable  . Back surgery    . Kidney surgery for cancer      History reviewed. No pertinent family history.  History  Substance Use Topics  . Smoking status: Never Smoker   . Smokeless tobacco: Not on file  . Alcohol Use: No    No OB history provided.   Review of Systems  HENT: Positive for nosebleeds.   Respiratory: Negative for shortness of breath.   Cardiovascular: Negative for chest pain.  Neurological: Positive for dizziness (mild).  All other systems reviewed and are negative.    Allergies  Shellfish allergy; Iodine; Propoxyphene-acetaminophen; Codeine; Haloperidol lactate; Hydromorphone hcl; and Latex  Home Medications   Current Outpatient Rx  Name  Route  Sig  Dispense  Refill  . ALPRAZolam Prudy Feeler) 1  MG tablet   Oral   Take 1 mg by mouth 4 (four) times daily.          . busPIRone (BUSPAR) 15 MG tablet   Oral   Take 15 mg by mouth 3 (three) times daily.          . cholestyramine (QUESTRAN) 4 GM/DOSE powder   Oral   Take 0.5 packets (2 g total) by mouth daily. Do not take within two hours of other meds.   20 packet   5     Please give patient can not packets.   . cyclobenzaprine (FLEXERIL) 10 MG tablet   Oral   Take 20 mg by mouth at bedtime.           . hydrOXYzine (VISTARIL) 25 MG capsule   Oral   Take 25 mg by mouth 3 (three) times daily.          Marland Kitchen olanzapine-FLUoxetine (SYMBYAX) 6-50 MG per capsule   Oral    Take 2 capsules by mouth at bedtime.           Marland Kitchen oxyCODONE-acetaminophen (PERCOCET) 10-325 MG per tablet   Oral   Take 1 tablet by mouth every 4 (four) hours.   30 tablet   0   . rOPINIRole (REQUIP) 0.5 MG tablet   Oral   Take 0.5 mg by mouth at bedtime.           . solifenacin (VESICARE) 10 MG tablet   Oral   Take 5 mg by mouth at bedtime.             BP 151/69  Pulse 110  Temp(Src) 97.9 F (36.6 C) (Oral)  Resp 20  Ht 5' 5.5" (1.664 m)  Wt 186 lb (84.369 kg)  BMI 30.47 kg/m2  SpO2 96%  Physical Exam  Nursing note and vitals reviewed. Constitutional: She is oriented to person, place, and time. She appears well-developed and well-nourished. No distress.  HENT:  Head: Normocephalic and atraumatic.  Active bleeding from left nare, dry blood in mouth  Eyes: EOM are normal.  Neck: Neck supple. No tracheal deviation present.  Cardiovascular: Normal rate, regular rhythm and normal heart sounds.   No murmur heard. Pulmonary/Chest: Effort normal and breath sounds normal. No respiratory distress. She has no wheezes.  Abdominal: She exhibits no distension.  Musculoskeletal: Normal range of motion.  Neurological: She is alert and oriented to person, place, and time.  Skin: Skin is warm and dry.  Psychiatric: She has a normal mood and affect. Her behavior is normal.    ED Course  Procedures (including critical care time) DIAGNOSTIC STUDIES: Oxygen Saturation is 96% on room air, adequate by my interpretation.    COORDINATION OF CARE: 7:26 PM- Patient informed of clinical course, understands medical decision-making process, and agrees with plan.  Results for orders placed during the hospital encounter of 11/06/12  CBC      Result Value Range   WBC 6.3  4.0 - 10.5 K/uL   RBC 3.49 (*) 3.87 - 5.11 MIL/uL   Hemoglobin 10.7 (*) 12.0 - 15.0 g/dL   HCT 40.9 (*) 81.1 - 91.4 %   MCV 92.6  78.0 - 100.0 fL   MCH 30.7  26.0 - 34.0 pg   MCHC 33.1  30.0 - 36.0 g/dL   RDW  78.2  95.6 - 21.3 %   Platelets 232  150 - 400 K/uL  PROTIME-INR      Result Value Range   Prothrombin  Time 12.8  11.6 - 15.2 seconds   INR 0.97  0.00 - 1.49     Diagnosis: Epistaxis    MDM  Patient presents to the ER with complaints of acute epistaxis. Patient reports that she had an episode of bleeding yesterday which spontaneously resolved. She then started bleeding again this afternoon it has been heavy. She has mostly come from the left side, but at times has come out of the right. She reports that she has passed large clots. She has not had any injury to the nose. There is no chest pain, shortness of breath. Examination revealed fairly brisk bleeding from the left side. This did eventually slow down, to the point where it appeared that the bleeding was coming from the posterior region. I therefore decided to pack the nose with a nasal balloon. This was performed and there was no further bleeding. Patient will be discharged, followup with her primary doctor or ENT in 2 days for packing removal. Bactrim prescribed. Return to the ER if area starts bleeding again.  I personally performed the services described in this documentation, which was scribed in my presence. The recorded information has been reviewed and is accurate.        Gilda Crease, MD 11/06/12 2137

## 2012-11-06 NOTE — ED Notes (Signed)
Pt experiencing epistaxis in the left nare. States she "can feel it going down my throat" as well.  Has papertowel packed into left nare at this time.

## 2012-11-06 NOTE — ED Notes (Signed)
Bleeding from bil nares.  Has paper towel stuck in nares.  No injury. No blood thinners.

## 2012-11-06 NOTE — ED Notes (Signed)
Assisted Dr. Blinda Leatherwood in packing the left nare. Pt tolerated well. No distress noted at this time.

## 2012-11-22 ENCOUNTER — Other Ambulatory Visit: Payer: Self-pay | Admitting: Neurological Surgery

## 2012-11-22 DIAGNOSIS — M549 Dorsalgia, unspecified: Secondary | ICD-10-CM

## 2012-11-28 ENCOUNTER — Other Ambulatory Visit: Payer: Medicaid Other

## 2012-11-30 ENCOUNTER — Ambulatory Visit
Admission: RE | Admit: 2012-11-30 | Discharge: 2012-11-30 | Disposition: A | Discharge status: Home / Self Care | Payer: Medicaid Other | Source: Ambulatory Visit | Attending: Neurological Surgery | Admitting: Neurological Surgery

## 2012-11-30 ENCOUNTER — Other Ambulatory Visit: Payer: Self-pay | Admitting: Neurological Surgery

## 2012-11-30 ENCOUNTER — Inpatient Hospital Stay
Admission: RE | Admit: 2012-11-30 | Discharge: 2012-11-30 | Disposition: A | Discharge status: Home / Self Care | Payer: Self-pay | Source: Ambulatory Visit | Attending: Neurological Surgery | Admitting: Neurological Surgery

## 2012-11-30 VITALS — BP 144/68 | HR 90

## 2012-11-30 DIAGNOSIS — M549 Dorsalgia, unspecified: Secondary | ICD-10-CM

## 2012-11-30 DIAGNOSIS — M545 Low back pain: Secondary | ICD-10-CM

## 2012-11-30 DIAGNOSIS — M961 Postlaminectomy syndrome, not elsewhere classified: Secondary | ICD-10-CM

## 2012-11-30 MED ORDER — IOHEXOL 180 MG/ML  SOLN
15.0000 mL | Freq: Once | INTRAMUSCULAR | Status: AC | PRN
  Administered 2012-11-30: 15 mL via INTRATHECAL

## 2012-11-30 MED ORDER — ONDANSETRON HCL 4 MG/2ML IJ SOLN
4.0000 mg | Freq: Once | INTRAMUSCULAR | Status: AC
  Administered 2012-11-30: 4 mg via INTRAMUSCULAR

## 2012-11-30 MED ORDER — DIAZEPAM 5 MG PO TABS
10.0000 mg | ORAL_TABLET | Freq: Once | ORAL | Status: AC
  Administered 2012-11-30: 10 mg via ORAL

## 2012-11-30 MED ORDER — MORPHINE SULFATE 4 MG/ML IJ SOLN
8.0000 mg | Freq: Once | INTRAMUSCULAR | Status: AC
Start: 2012-11-30 — End: 2012-11-30
  Administered 2012-11-30: 8 mg via INTRAMUSCULAR

## 2012-11-30 NOTE — Progress Notes (Signed)
Patient states she has been off Fluoxetine and Olanzapine for at least the past two days.  jkl

## 2012-11-30 NOTE — Progress Notes (Signed)
Back pain and headache are gone, legs still hurt post pain meds

## 2012-12-19 ENCOUNTER — Other Ambulatory Visit: Payer: Self-pay | Admitting: Neurological Surgery

## 2012-12-28 ENCOUNTER — Encounter (HOSPITAL_COMMUNITY)
Admission: RE | Admit: 2012-12-28 | Discharge: 2012-12-28 | Disposition: A | Discharge status: Home / Self Care | Payer: Medicaid Other | Source: Ambulatory Visit | Attending: Anesthesiology | Admitting: Anesthesiology

## 2012-12-28 ENCOUNTER — Encounter (HOSPITAL_COMMUNITY)
Admission: RE | Admit: 2012-12-28 | Discharge: 2012-12-28 | Disposition: A | Discharge status: Home / Self Care | Payer: Medicaid Other | Source: Ambulatory Visit | Attending: Neurological Surgery | Admitting: Neurological Surgery

## 2012-12-28 ENCOUNTER — Encounter (HOSPITAL_COMMUNITY): Payer: Self-pay

## 2012-12-28 HISTORY — DX: Polyneuropathy, unspecified: G62.9

## 2012-12-28 HISTORY — DX: Cardiac murmur, unspecified: R01.1

## 2012-12-28 HISTORY — DX: Anxiety disorder, unspecified: F41.9

## 2012-12-28 LAB — BASIC METABOLIC PANEL
CO2: 27 mEq/L (ref 19–32)
Chloride: 100 mEq/L (ref 96–112)
Creatinine, Ser: 0.81 mg/dL (ref 0.50–1.10)
GFR calc Af Amer: 87 mL/min — ABNORMAL LOW (ref >90)
Potassium: 3.8 mEq/L (ref 3.5–5.1)

## 2012-12-28 LAB — CBC WITH DIFFERENTIAL/PLATELET
Basophils Absolute: 0 10*3/uL (ref 0.0–0.1)
Eosinophils Relative: 0 % (ref 0–5)
HCT: 35.2 % — ABNORMAL LOW (ref 36.0–46.0)
Hemoglobin: 11.5 g/dL — ABNORMAL LOW (ref 12.0–15.0)
Lymphocytes Relative: 31 % (ref 12–46)
Lymphs Abs: 2 10*3/uL (ref 0.7–4.0)
MCV: 88.2 fL (ref 78.0–100.0)
Monocytes Absolute: 0.6 10*3/uL (ref 0.1–1.0)
Monocytes Relative: 9 % (ref 3–12)
Neutro Abs: 4 10*3/uL (ref 1.7–7.7)
RBC: 3.99 MIL/uL (ref 3.87–5.11)
RDW: 13 % (ref 11.5–15.5)
WBC: 6.7 10*3/uL (ref 4.0–10.5)

## 2012-12-28 LAB — SURGICAL PCR SCREEN: MRSA, PCR: NEGATIVE

## 2012-12-28 LAB — TYPE AND SCREEN: Antibody Screen: NEGATIVE

## 2012-12-28 LAB — ABO/RH: ABO/RH(D): O POS

## 2012-12-28 NOTE — Progress Notes (Signed)
Primary Physician - Dr. Jewel Baize in [place] Does not have a cardiologist No cardiac testing in last 5 years. Had cardiac cath more than 10 years ago (thinks performed at [facility]).

## 2012-12-28 NOTE — Pre-Procedure Instructions (Signed)
Victoria Mccarthy  12/28/2012   Your procedure is scheduled on:  Thursday, April 17th  Report to [facility] at 1025 AM.  Call this number if you have problems the morning of surgery: [phone]   Remember:   Do not eat food or drink liquids after midnight.    Take these medicines the morning of surgery with A SIP OF WATER: xanax, percocet if needed   Do not wear jewelry, make-up or nail polish.  Do not wear lotions, powders, or perfumes, deodorant.  Do not shave 48 hours prior to surgery.   Do not bring valuables to the hospital.  Contacts, dentures or bridgework may not be worn into surgery.  Leave suitcase in the car. After surgery it may be brought to your room.  For patients admitted to the hospital, checkout time is 11:00 AM the day of discharge.   Patients discharged the day of surgery will not be allowed to drive home.    Special Instructions: Shower using CHG 2 nights before surgery and the night before surgery.  If you shower the day of surgery use CHG.  Use special wash - you have one bottle of CHG for all showers.  You should use approximately 1/3 of the bottle for each shower.   Please read over the following fact sheets that you were given: Pain Booklet, Coughing and Deep Breathing, Blood Transfusion Information, MRSA Information and Surgical Site Infection Prevention

## 2012-12-29 ENCOUNTER — Inpatient Hospital Stay (HOSPITAL_COMMUNITY): Admission: RE | Admit: 2012-12-29 | Payer: Medicaid Other | Source: Ambulatory Visit

## 2013-01-03 MED ORDER — CEFAZOLIN SODIUM-DEXTROSE 2-3 GM-% IV SOLR
2.0000 g | INTRAVENOUS | Status: AC
  Administered 2013-01-04: 2 g via INTRAVENOUS
  Filled 2013-01-03: qty 50

## 2013-01-04 ENCOUNTER — Inpatient Hospital Stay (HOSPITAL_COMMUNITY): Payer: Medicaid Other | Admitting: Certified Registered Nurse Anesthetist

## 2013-01-04 ENCOUNTER — Inpatient Hospital Stay (HOSPITAL_COMMUNITY)
Admission: RE | Admit: 2013-01-04 | Discharge: 2013-01-08 | DRG: 460 | Disposition: A | Discharge status: Home / Self Care | Payer: Medicaid Other | Source: Ambulatory Visit | Attending: Neurological Surgery | Admitting: Neurological Surgery

## 2013-01-04 ENCOUNTER — Inpatient Hospital Stay (HOSPITAL_COMMUNITY): Payer: Medicaid Other

## 2013-01-04 ENCOUNTER — Encounter (HOSPITAL_COMMUNITY)
Admission: RE | Disposition: A | Discharge status: Home / Self Care | Payer: Self-pay | Source: Ambulatory Visit | Attending: Neurological Surgery

## 2013-01-04 ENCOUNTER — Encounter (HOSPITAL_COMMUNITY): Payer: Self-pay | Admitting: Neurological Surgery

## 2013-01-04 ENCOUNTER — Encounter (HOSPITAL_COMMUNITY): Payer: Self-pay | Admitting: Certified Registered Nurse Anesthetist

## 2013-01-04 DIAGNOSIS — K219 Gastro-esophageal reflux disease without esophagitis: Secondary | ICD-10-CM | POA: Diagnosis present

## 2013-01-04 DIAGNOSIS — Z79899 Other long term (current) drug therapy: Secondary | ICD-10-CM

## 2013-01-04 DIAGNOSIS — Z01810 Encounter for preprocedural cardiovascular examination: Secondary | ICD-10-CM

## 2013-01-04 DIAGNOSIS — F3130 Bipolar disorder, current episode depressed, mild or moderate severity, unspecified: Secondary | ICD-10-CM | POA: Diagnosis present

## 2013-01-04 DIAGNOSIS — Z981 Arthrodesis status: Secondary | ICD-10-CM

## 2013-01-04 DIAGNOSIS — I059 Rheumatic mitral valve disease, unspecified: Secondary | ICD-10-CM | POA: Diagnosis present

## 2013-01-04 DIAGNOSIS — Z01812 Encounter for preprocedural laboratory examination: Secondary | ICD-10-CM

## 2013-01-04 DIAGNOSIS — M810 Age-related osteoporosis without current pathological fracture: Secondary | ICD-10-CM | POA: Diagnosis present

## 2013-01-04 DIAGNOSIS — Z85528 Personal history of other malignant neoplasm of kidney: Secondary | ICD-10-CM

## 2013-01-04 DIAGNOSIS — M48061 Spinal stenosis, lumbar region without neurogenic claudication: Principal | ICD-10-CM | POA: Diagnosis present

## 2013-01-04 DIAGNOSIS — Z01818 Encounter for other preprocedural examination: Secondary | ICD-10-CM

## 2013-01-04 DIAGNOSIS — G589 Mononeuropathy, unspecified: Secondary | ICD-10-CM | POA: Diagnosis present

## 2013-01-04 DIAGNOSIS — F411 Generalized anxiety disorder: Secondary | ICD-10-CM | POA: Diagnosis present

## 2013-01-04 SURGERY — FOR MAXIMUM ACCESS (MAS) POSTERIOR LUMBAR INTERBODY FUSION (PLIF) 1 LEVEL
Anesthesia: General | Site: Back | Wound class: Clean

## 2013-01-04 MED ORDER — SUCCINYLCHOLINE CHLORIDE 20 MG/ML IJ SOLN
INTRAMUSCULAR | Status: DC | PRN
  Administered 2013-01-04: 100 mg via INTRAVENOUS

## 2013-01-04 MED ORDER — FENTANYL CITRATE 0.05 MG/ML IJ SOLN
25.0000 ug | INTRAMUSCULAR | Status: DC | PRN
  Administered 2013-01-04: 50 ug via INTRAVENOUS

## 2013-01-04 MED ORDER — CHOLESTYRAMINE 4 GM/DOSE PO POWD: 4.0000 g | Freq: Every evening | ORAL | Status: DC

## 2013-01-04 MED ORDER — EPHEDRINE SULFATE 50 MG/ML IJ SOLN
INTRAMUSCULAR | Status: DC | PRN
  Administered 2013-01-04: 10 mg via INTRAVENOUS
  Administered 2013-01-04: 5 mg via INTRAVENOUS
  Administered 2013-01-04: 10 mg via INTRAVENOUS

## 2013-01-04 MED ORDER — CELECOXIB 200 MG PO CAPS
200.0000 mg | ORAL_CAPSULE | Freq: Two times a day (BID) | ORAL | Status: DC
  Administered 2013-01-04 – 2013-01-08 (×8): 200 mg via ORAL
  Filled 2013-01-04 (×9): qty 1

## 2013-01-04 MED ORDER — ACETAMINOPHEN 325 MG PO TABS: 650.0000 mg | ORAL_TABLET | ORAL | Status: DC | PRN

## 2013-01-04 MED ORDER — ALPRAZOLAM ER 1 MG PO TB24: 1.0000 mg | ORAL_TABLET | Freq: Two times a day (BID) | ORAL | Status: DC

## 2013-01-04 MED ORDER — SODIUM CHLORIDE 0.9 % IJ SOLN: 3.0000 mL | INTRAMUSCULAR | Status: DC | PRN

## 2013-01-04 MED ORDER — SENNA 8.6 MG PO TABS
1.0000 | ORAL_TABLET | Freq: Two times a day (BID) | ORAL | Status: DC
  Administered 2013-01-04 – 2013-01-08 (×8): 8.6 mg via ORAL
  Filled 2013-01-04 (×9): qty 1

## 2013-01-04 MED ORDER — FLUOXETINE HCL 20 MG PO CAPS
80.0000 mg | ORAL_CAPSULE | Freq: Every day | ORAL | Status: DC
  Administered 2013-01-04 – 2013-01-08 (×5): 80 mg via ORAL
  Filled 2013-01-04 (×5): qty 4

## 2013-01-04 MED ORDER — DEXAMETHASONE 4 MG PO TABS
4.0000 mg | ORAL_TABLET | Freq: Four times a day (QID) | ORAL | Status: DC
  Administered 2013-01-04 – 2013-01-08 (×14): 4 mg via ORAL
  Filled 2013-01-04 (×19): qty 1

## 2013-01-04 MED ORDER — SODIUM CHLORIDE 0.9 % IR SOLN
Status: DC | PRN
  Administered 2013-01-04: 13:00:00

## 2013-01-04 MED ORDER — ACETAMINOPHEN 10 MG/ML IV SOLN
1000.0000 mg | Freq: Four times a day (QID) | INTRAVENOUS | Status: AC
  Administered 2013-01-04 – 2013-01-05 (×4): 1000 mg via INTRAVENOUS
  Filled 2013-01-04 (×5): qty 100

## 2013-01-04 MED ORDER — FENTANYL CITRATE 0.05 MG/ML IJ SOLN
INTRAMUSCULAR | Status: AC
  Administered 2013-01-04: 50 ug via INTRAVENOUS
  Filled 2013-01-04: qty 2

## 2013-01-04 MED ORDER — MENTHOL 3 MG MT LOZG: 1.0000 | LOZENGE | OROMUCOSAL | Status: DC | PRN

## 2013-01-04 MED ORDER — ALPRAZOLAM 0.5 MG PO TABS
1.0000 mg | ORAL_TABLET | Freq: Two times a day (BID) | ORAL | Status: DC
  Administered 2013-01-04 – 2013-01-08 (×8): 1 mg via ORAL
  Filled 2013-01-04 (×8): qty 2

## 2013-01-04 MED ORDER — PHENYLEPHRINE HCL 10 MG/ML IJ SOLN
INTRAMUSCULAR | Status: DC | PRN
  Administered 2013-01-04 (×5): 40 ug via INTRAVENOUS

## 2013-01-04 MED ORDER — 0.9 % SODIUM CHLORIDE (POUR BTL) OPTIME
TOPICAL | Status: DC | PRN
  Administered 2013-01-04: 1000 mL

## 2013-01-04 MED ORDER — PHENYLEPHRINE HCL 10 MG/ML IJ SOLN
10.0000 mg | INTRAVENOUS | Status: DC | PRN
  Administered 2013-01-04: 100 ug/min via INTRAVENOUS

## 2013-01-04 MED ORDER — PROPOFOL INFUSION 10 MG/ML OPTIME
INTRAVENOUS | Status: DC | PRN
  Administered 2013-01-04: 50 ug/kg/min via INTRAVENOUS

## 2013-01-04 MED ORDER — HEMOSTATIC AGENTS (NO CHARGE) OPTIME
TOPICAL | Status: DC | PRN
  Administered 2013-01-04: 1 via TOPICAL

## 2013-01-04 MED ORDER — ROPINIROLE HCL 1 MG PO TABS
1.0000 mg | ORAL_TABLET | Freq: Every day | ORAL | Status: DC
  Administered 2013-01-04 – 2013-01-07 (×4): 1 mg via ORAL
  Filled 2013-01-04 (×5): qty 1

## 2013-01-04 MED ORDER — ALBUMIN HUMAN 5 % IV SOLN
INTRAVENOUS | Status: DC | PRN
  Administered 2013-01-04: 13:00:00 via INTRAVENOUS

## 2013-01-04 MED ORDER — BACITRACIN 50000 UNITS IM SOLR
INTRAMUSCULAR | Status: AC
  Filled 2013-01-04: qty 1

## 2013-01-04 MED ORDER — SODIUM CHLORIDE 0.9 % IJ SOLN
3.0000 mL | Freq: Two times a day (BID) | INTRAMUSCULAR | Status: DC
  Administered 2013-01-04 – 2013-01-07 (×4): 3 mL via INTRAVENOUS

## 2013-01-04 MED ORDER — ACETAMINOPHEN 650 MG RE SUPP: 650.0000 mg | RECTAL | Status: DC | PRN

## 2013-01-04 MED ORDER — NITROGLYCERIN 0.4 MG SL SUBL: 0.4000 mg | SUBLINGUAL_TABLET | SUBLINGUAL | Status: DC | PRN

## 2013-01-04 MED ORDER — POTASSIUM CHLORIDE IN NACL 20-0.9 MEQ/L-% IV SOLN
INTRAVENOUS | Status: DC
  Administered 2013-01-04 – 2013-01-06 (×3): via INTRAVENOUS
  Filled 2013-01-04 (×8): qty 1000

## 2013-01-04 MED ORDER — OLANZAPINE-FLUOXETINE HCL 3-25 MG PO CAPS: 4.0000 | ORAL_CAPSULE | Freq: Every evening | ORAL | Status: DC

## 2013-01-04 MED ORDER — GABAPENTIN 300 MG PO CAPS
300.0000 mg | ORAL_CAPSULE | Freq: Three times a day (TID) | ORAL | Status: DC
  Administered 2013-01-04 – 2013-01-08 (×9): 300 mg via ORAL
  Filled 2013-01-04 (×13): qty 1

## 2013-01-04 MED ORDER — ACETAMINOPHEN 10 MG/ML IV SOLN
INTRAVENOUS | Status: AC
  Administered 2013-01-04: 1000 mg via INTRAVENOUS
  Filled 2013-01-04: qty 100

## 2013-01-04 MED ORDER — PROPOFOL 10 MG/ML IV BOLUS
INTRAVENOUS | Status: DC | PRN
  Administered 2013-01-04: 140 mg via INTRAVENOUS

## 2013-01-04 MED ORDER — LACTATED RINGERS IV SOLN
INTRAVENOUS | Status: DC | PRN
  Administered 2013-01-04 (×2): via INTRAVENOUS

## 2013-01-04 MED ORDER — CYCLOBENZAPRINE HCL 10 MG PO TABS
10.0000 mg | ORAL_TABLET | Freq: Three times a day (TID) | ORAL | Status: DC | PRN
  Administered 2013-01-04 – 2013-01-05 (×2): 10 mg via ORAL
  Filled 2013-01-04 (×3): qty 1

## 2013-01-04 MED ORDER — BUPIVACAINE HCL (PF) 0.25 % IJ SOLN
INTRAMUSCULAR | Status: DC | PRN
  Administered 2013-01-04: 3 mL

## 2013-01-04 MED ORDER — SODIUM CHLORIDE 0.9 % IV SOLN: 250.0000 mL | INTRAVENOUS | Status: DC

## 2013-01-04 MED ORDER — ONDANSETRON HCL 4 MG/2ML IJ SOLN
INTRAMUSCULAR | Status: DC | PRN
  Administered 2013-01-04: 4 mg via INTRAVENOUS

## 2013-01-04 MED ORDER — MIDAZOLAM HCL 5 MG/5ML IJ SOLN
INTRAMUSCULAR | Status: DC | PRN
  Administered 2013-01-04: 2 mg via INTRAVENOUS

## 2013-01-04 MED ORDER — ARTIFICIAL TEARS OP OINT
TOPICAL_OINTMENT | OPHTHALMIC | Status: DC | PRN
  Administered 2013-01-04: 1 via OPHTHALMIC

## 2013-01-04 MED ORDER — ALBUTEROL SULFATE HFA 108 (90 BASE) MCG/ACT IN AERS
INHALATION_SPRAY | RESPIRATORY_TRACT | Status: DC | PRN
  Administered 2013-01-04: 2 via RESPIRATORY_TRACT

## 2013-01-04 MED ORDER — DEXAMETHASONE SODIUM PHOSPHATE 4 MG/ML IJ SOLN
4.0000 mg | Freq: Four times a day (QID) | INTRAMUSCULAR | Status: DC
  Administered 2013-01-06: 4 mg via INTRAVENOUS
  Filled 2013-01-04 (×16): qty 1

## 2013-01-04 MED ORDER — CHOLESTYRAMINE 4 G PO PACK
4.0000 g | PACK | Freq: Every evening | ORAL | Status: DC
  Administered 2013-01-04 – 2013-01-07 (×4): 4 g via ORAL
  Filled 2013-01-04 (×6): qty 1

## 2013-01-04 MED ORDER — GLYCOPYRROLATE 0.2 MG/ML IJ SOLN
INTRAMUSCULAR | Status: DC | PRN
  Administered 2013-01-04: 0.2 mg via INTRAVENOUS

## 2013-01-04 MED ORDER — MORPHINE SULFATE 2 MG/ML IJ SOLN
1.0000 mg | INTRAMUSCULAR | Status: DC | PRN
  Administered 2013-01-04 (×2): 2 mg via INTRAVENOUS
  Filled 2013-01-04 (×2): qty 1

## 2013-01-04 MED ORDER — THROMBIN 5000 UNITS EX SOLR
CUTANEOUS | Status: DC | PRN
  Administered 2013-01-04: 5000 [IU] via TOPICAL

## 2013-01-04 MED ORDER — OXYCODONE HCL 5 MG PO TABS
10.0000 mg | ORAL_TABLET | ORAL | Status: DC | PRN
  Administered 2013-01-04 – 2013-01-08 (×17): 10 mg via ORAL
  Filled 2013-01-04 (×17): qty 2

## 2013-01-04 MED ORDER — SURGIFOAM 100 EX MISC
CUTANEOUS | Status: DC | PRN
  Administered 2013-01-04: 12:00:00 via TOPICAL

## 2013-01-04 MED ORDER — FENTANYL CITRATE 0.05 MG/ML IJ SOLN
INTRAMUSCULAR | Status: DC | PRN
  Administered 2013-01-04 (×2): 100 ug via INTRAVENOUS
  Administered 2013-01-04: 50 ug via INTRAVENOUS

## 2013-01-04 MED ORDER — PROMETHAZINE HCL 25 MG/ML IJ SOLN: 6.2500 mg | INTRAMUSCULAR | Status: DC | PRN

## 2013-01-04 MED ORDER — CEFAZOLIN SODIUM 1-5 GM-% IV SOLN
1.0000 g | Freq: Three times a day (TID) | INTRAVENOUS | Status: AC
  Administered 2013-01-04 – 2013-01-05 (×2): 1 g via INTRAVENOUS
  Filled 2013-01-04 (×2): qty 50

## 2013-01-04 MED ORDER — DEXAMETHASONE SODIUM PHOSPHATE 10 MG/ML IJ SOLN
INTRAMUSCULAR | Status: AC
  Filled 2013-01-04: qty 1

## 2013-01-04 MED ORDER — ONDANSETRON HCL 4 MG/2ML IJ SOLN: 4.0000 mg | INTRAMUSCULAR | Status: DC | PRN

## 2013-01-04 MED ORDER — SODIUM CHLORIDE 0.9 % IV SOLN
INTRAVENOUS | Status: AC
  Filled 2013-01-04: qty 500

## 2013-01-04 MED ORDER — DARIFENACIN HYDROBROMIDE ER 7.5 MG PO TB24
7.5000 mg | ORAL_TABLET | Freq: Every day | ORAL | Status: DC
  Administered 2013-01-04 – 2013-01-08 (×5): 7.5 mg via ORAL
  Filled 2013-01-04 (×5): qty 1

## 2013-01-04 MED ORDER — OLANZAPINE 10 MG PO TABS
10.0000 mg | ORAL_TABLET | Freq: Every day | ORAL | Status: DC
  Administered 2013-01-04 – 2013-01-07 (×4): 10 mg via ORAL
  Filled 2013-01-04 (×5): qty 1

## 2013-01-04 MED ORDER — PHENOL 1.4 % MT LIQD: 1.0000 | OROMUCOSAL | Status: DC | PRN

## 2013-01-04 SURGICAL SUPPLY — 72 items
ADH SKN CLS APL DERMABOND .7 (GAUZE/BANDAGES/DRESSINGS) ×1
APL SKNCLS STERI-STRIP NONHPOA (GAUZE/BANDAGES/DRESSINGS) ×1
BAG DECANTER FOR FLEXI CONT (MISCELLANEOUS) ×2 IMPLANT
BENZOIN TINCTURE PRP APPL 2/3 (GAUZE/BANDAGES/DRESSINGS) ×2 IMPLANT
BLADE SURG ROTATE 9660 (MISCELLANEOUS) IMPLANT
BONE MATRIX OSTEOCEL PLUS 5CC (Bone Implant) ×1 IMPLANT
BUR MATCHSTICK NEURO 3.0 LAGG (BURR) ×2 IMPLANT
CAGE COROENT MP 8X23 (Cage) ×2 IMPLANT
CANISTER SUCTION 2500CC (MISCELLANEOUS) ×2 IMPLANT
CLOTH BEACON ORANGE TIMEOUT ST (SAFETY) ×2 IMPLANT
CONT SPEC 4OZ CLIKSEAL STRL BL (MISCELLANEOUS) ×4 IMPLANT
COVER BACK TABLE 24X17X13 BIG (DRAPES) IMPLANT
COVER TABLE BACK 60X90 (DRAPES) ×2 IMPLANT
DERMABOND ADVANCED (GAUZE/BANDAGES/DRESSINGS) ×1
DERMABOND ADVANCED .7 DNX12 (GAUZE/BANDAGES/DRESSINGS) IMPLANT
DRAPE C-ARM 42X72 X-RAY (DRAPES) ×2 IMPLANT
DRAPE C-ARMOR (DRAPES) ×2 IMPLANT
DRAPE INCISE 23X17 IOBAN STRL (DRAPES) ×1
DRAPE INCISE 23X17 STRL (DRAPES) IMPLANT
DRAPE INCISE IOBAN 23X17 STRL (DRAPES) ×1 IMPLANT
DRAPE LAPAROTOMY 100X72X124 (DRAPES) ×2 IMPLANT
DRAPE POUCH INSTRU U-SHP 10X18 (DRAPES) ×2 IMPLANT
DRAPE SURG 17X23 STRL (DRAPES) ×2 IMPLANT
DRESSING TELFA 8X3 (GAUZE/BANDAGES/DRESSINGS) ×2 IMPLANT
DRSG OPSITE 4X5.5 SM (GAUZE/BANDAGES/DRESSINGS) ×3 IMPLANT
DURAPREP 26ML APPLICATOR (WOUND CARE) ×1 IMPLANT
ELECT REM PT RETURN 9FT ADLT (ELECTROSURGICAL) ×2
ELECTRODE REM PT RTRN 9FT ADLT (ELECTROSURGICAL) ×1 IMPLANT
EVACUATOR 1/8 PVC DRAIN (DRAIN) ×2 IMPLANT
GAUZE SPONGE 4X4 16PLY XRAY LF (GAUZE/BANDAGES/DRESSINGS) IMPLANT
GLOVE BIO SURGEON STRL SZ8 (GLOVE) ×2 IMPLANT
GLOVE BIOGEL M 8.0 STRL (GLOVE) ×2 IMPLANT
GLOVE INDICATOR 7.5 STRL GRN (GLOVE) ×1 IMPLANT
GLOVE INDICATOR 8.0 STRL GRN (GLOVE) ×1 IMPLANT
GLOVE SURG SS PI 7.5 STRL IVOR (GLOVE) ×6 IMPLANT
GOWN BRE IMP SLV AUR LG STRL (GOWN DISPOSABLE) IMPLANT
GOWN BRE IMP SLV AUR XL STRL (GOWN DISPOSABLE) ×4 IMPLANT
GOWN STRL REIN 2XL LVL4 (GOWN DISPOSABLE) IMPLANT
HEMOSTAT POWDER SURGIFOAM 1G (HEMOSTASIS) ×1 IMPLANT
KIT BASIN OR (CUSTOM PROCEDURE TRAY) ×2 IMPLANT
KIT DILATOR XLIF 5 (KITS) IMPLANT
KIT NDL NVM5 EMG ELECT (KITS) IMPLANT
KIT NEEDLE NVM5 EMG ELECT (KITS) ×1 IMPLANT
KIT NEEDLE NVM5 EMG ELECTRODE (KITS) ×1
KIT ROOM TURNOVER OR (KITS) ×2 IMPLANT
KIT XLIF (KITS) ×1
MILL MEDIUM DISP (BLADE) ×1 IMPLANT
NDL HYPO 25X1 1.5 SAFETY (NEEDLE) ×1 IMPLANT
NEEDLE HYPO 25X1 1.5 SAFETY (NEEDLE) ×2 IMPLANT
NS IRRIG 1000ML POUR BTL (IV SOLUTION) ×2 IMPLANT
PACK LAMINECTOMY NEURO (CUSTOM PROCEDURE TRAY) ×2 IMPLANT
PAD ARMBOARD 7.5X6 YLW CONV (MISCELLANEOUS) ×6 IMPLANT
ROD 5.5X40MM (Rod) ×2 IMPLANT
SCREW LOCK (Screw) ×8 IMPLANT
SCREW LOCK FXNS SPNE MAS PL (Screw) IMPLANT
SCREW MAS PLIF 5.5X30 (Screw) ×2 IMPLANT
SCREW PAS PLIF 5X30 (Screw) IMPLANT
SCREW SHANK 5.0X30MM (Screw) ×2 IMPLANT
SCREW TULIP 5.5 (Screw) ×2 IMPLANT
SPONGE LAP 4X18 X RAY DECT (DISPOSABLE) IMPLANT
SPONGE SURGIFOAM ABS GEL 100 (HEMOSTASIS) ×2 IMPLANT
STRIP CLOSURE SKIN 1/2X4 (GAUZE/BANDAGES/DRESSINGS) ×3 IMPLANT
SUT VIC AB 0 CT1 18XCR BRD8 (SUTURE) ×1 IMPLANT
SUT VIC AB 0 CT1 8-18 (SUTURE) ×2
SUT VIC AB 2-0 CP2 18 (SUTURE) ×2 IMPLANT
SUT VIC AB 3-0 SH 8-18 (SUTURE) ×3 IMPLANT
SYR 20ML ECCENTRIC (SYRINGE) ×2 IMPLANT
TOWEL OR 17X24 6PK STRL BLUE (TOWEL DISPOSABLE) ×2 IMPLANT
TOWEL OR 17X26 10 PK STRL BLUE (TOWEL DISPOSABLE) ×2 IMPLANT
TRAY FOLEY BAG SILVER LF 14FR (CATHETERS) ×1 IMPLANT
TRAY FOLEY CATH 14FRSI W/METER (CATHETERS) ×1 IMPLANT
WATER STERILE IRR 1000ML POUR (IV SOLUTION) ×2 IMPLANT

## 2013-01-04 NOTE — Anesthesia Procedure Notes (Signed)
Procedure Name: Intubation Date/Time: 01/04/2013 12:27 PM Performed by: Margaree Mackintosh Pre-anesthesia Checklist: Patient identified, Timeout performed, Emergency Drugs available, Suction available and Patient being monitored Patient Re-evaluated:Patient Re-evaluated prior to inductionOxygen Delivery Method: Circle system utilized Preoxygenation: Pre-oxygenation with 100% oxygen Intubation Type: IV induction Ventilation: Mask ventilation without difficulty Laryngoscope Size: Mac and 3 Grade View: Grade I Tube type: Oral Tube size: 7.0 mm Number of attempts: 1 Airway Equipment and Method: Stylet and LTA kit utilized Placement Confirmation: ETT inserted through vocal cords under direct vision,  positive ETCO2 and breath sounds checked- equal and bilateral Secured at: 20 cm Tube secured with: Tape Dental Injury: Teeth and Oropharynx as per pre-operative assessment

## 2013-01-04 NOTE — Preoperative (Signed)
Beta Blockers   Reason not to administer Beta Blockers:Not Applicable 

## 2013-01-04 NOTE — Anesthesia Preprocedure Evaluation (Addendum)
Anesthesia Evaluation  Patient identified by MRN, date of birth, ID band Patient awake    Reviewed: Allergy & Precautions, H&P , NPO status , Patient's Chart, lab work & pertinent test results  History of Anesthesia Complications (+) PONV  Airway Mallampati: II TM Distance: >3 FB Neck ROM: Full    Dental  (+) Missing, Poor Dentition and Dental Advisory Given   Pulmonary neg pulmonary ROS,    Pulmonary exam normal       Cardiovascular negative cardio ROS      Neuro/Psych PSYCHIATRIC DISORDERS Anxiety Depression Bipolar Disorder    GI/Hepatic Neg liver ROS, GERD-  ,  Endo/Other  negative endocrine ROS  Renal/GU negative Renal ROS     Musculoskeletal   Abdominal   Peds  Hematology negative hematology ROS (+)   Anesthesia Other Findings   Reproductive/Obstetrics                          Anesthesia Physical Anesthesia Plan  ASA: III  Anesthesia Plan: General   Post-op Pain Management:    Induction: Intravenous  Airway Management Planned: Oral ETT  Additional Equipment:   Intra-op Plan:   Post-operative Plan: Extubation in OR  Informed Consent: I have reviewed the patients History and Physical, chart, labs and discussed the procedure including the risks, benefits and alternatives for the proposed anesthesia with the patient or authorized representative who has indicated his/her understanding and acceptance.   Dental advisory given  Plan Discussed with: CRNA, Anesthesiologist and Surgeon  Anesthesia Plan Comments:        Anesthesia Quick Evaluation

## 2013-01-04 NOTE — H&P (Signed)
Subjective: Patient is a 67 y.o. female admitted for PLIF L3-4. Onset of symptoms was many months ago, gradually worse since that time.  The pain is rated severe and is located at the low back and radiates to legs. The pain is described as aching and occurs all day. The symptoms have been progressive. Symptoms are exacerbated by activity. MRI or CT showed instability with severe stenosis L3-4.   Past Medical History  Diagnosis Date  . PONV (postoperative nausea and vomiting)   . Arthritis     chronic back pain  . Diarrhea     incontinent stools  . Nocturia   . Urinary frequency   . Anemia   . Depression   . Psychotic affective disorder     "psychotic delusions" "I cut myself"   . Restless leg syndrome   . Rheumatic fever   . MVP (mitral valve prolapse)   . Bipolar disorder   . Osteoporosis   . Osteoarthritis   . Diverticulitis     treated at least 5 times in past, hospitalized twice  . Cancer Jan 2013    kidney cancer, left, s/p partial nephrectomy  . Heart murmur   . GERD (gastroesophageal reflux disease)     esophageal spasms - takes nitroglycerin for this  . Anxiety   . Neuropathy     Past Surgical History  Procedure Laterality Date  . Tonsillectomy    . Abdominal hysterectomy    . Cholecystectomy    . Carpell tunnell      rt wrist  x2  . Nissen fundoplication    . Rotator cuff repair    . Robot assisted laparoscopic nephrectomy  08/23/2011    Procedure: ROBOTIC ASSISTED LAPAROSCOPIC NEPHRECTOMY;  Surgeon: Crecencio Mc, MD;  Location: [facility];  Service: Urology;  Laterality: Left;  Left Robotic Assisted  Laparoscopic Partial Nephrectomy   . Esophagogastroduodenoscopy  04/06/2010    intact Nissen fundoplication S/P dilation, somewhat baggy atonic appearing esophagus, 58-F dilation  . Esophagogastroduodenoscopy  1/11    nomral esophagus s/p 54-F Maloney dilation, normal/intact Nissen fundoplication, diffuse patchy erythema and erosions likely NSAID/ASA effect with benign  biopsy  . Colonoscopy  5/02    pancolonic deverticula, internal hemorrhoids  . Colonoscopy  1/11    single external hemorrhoidal tag and anal papilla otherwise normal rectum, pancolonic diverticula, s/p sigmoid biopsy and stool sampling all unremarkable  . Back surgery    . Kidney surgery for cancer      Prior to Admission medications   Medication Sig Start Date End Date Taking? Authorizing Provider  ALPRAZolam (XANAX XR) 1 MG 24 hr tablet Take 1 mg by mouth 2 (two) times daily.   Yes Historical Provider, MD  cholestyramine Lanetta Inch) 4 GM/DOSE powder Take 4 g by mouth every evening. Do not take within two hours of other meds. 09/25/12  Yes Joselyn Arrow, NP  cyclobenzaprine (FLEXERIL) 10 MG tablet Take 20 mg by mouth 2 (two) times daily as needed for muscle spasms.    Yes Historical Provider, MD  gabapentin (NEURONTIN) 300 MG capsule Take 300 mg by mouth 3 (three) times daily.   Yes Historical Provider, MD  Multiple Vitamin (MULTIVITAMIN WITH MINERALS) TABS Take 1 tablet by mouth every evening.   Yes Historical Provider, MD  nitroGLYCERIN (NITROSTAT) 0.4 MG SL tablet Place 0.4 mg under the tongue every 5 (five) minutes as needed for chest pain.   Yes Historical Provider, MD  olanzapine-FLUoxetine (SYMBYAX) 3-25 MG per capsule Take 4 capsules by  mouth every evening.   Yes Historical Provider, MD  oxyCODONE-acetaminophen (PERCOCET) 10-325 MG per tablet Take 1 tablet by mouth every 6 (six) hours as needed for pain.   Yes Historical Provider, MD  rOPINIRole (REQUIP) 1 MG tablet Take 1 mg by mouth at bedtime.   Yes Historical Provider, MD  solifenacin (VESICARE) 10 MG tablet Take 10 mg by mouth at bedtime.    Yes Historical Provider, MD   Allergies  Allergen Reactions  . Shellfish Allergy Anaphylaxis  . Codeine Itching     itching  . Haloperidol Lactate Other (See Comments)     muscle spasm  . Hydromorphone Hcl Nausea And Vomiting  . Latex Rash  . Propoxyphene-Acetaminophen Nausea And  Vomiting    History  Substance Use Topics  . Smoking status: Never Smoker   . Smokeless tobacco: Not on file  . Alcohol Use: No    No family history on file.   Review of Systems  Positive ROS: neg  All other systems have been reviewed and were otherwise negative with the exception of those mentioned in the HPI and as above.  Objective: Vital signs in last 24 hours:    General Appearance: Alert, cooperative, no distress, appears stated age Head: Normocephalic, without obvious abnormality, atraumatic Eyes: PERRL, conjunctiva/corneas clear     Neck: Supple, symmetrical, trachea midline Back: Symmetric Lungs: respirations unlabored Heart: Regular rate and rhythm Abdomen: Soft, non-tender Extremities: Extremities normal, atraumatic, no cyanosis or edema Pulses: 2+ and symmetric all extremities Skin: Skin color, texture, turgor normal, no rashes or lesions  NEUROLOGIC:   Mental status: Alert and oriented x4,  no aphasia, good attention span, fund of knowledge, and memory Motor Exam - grossly normal Sensory Exam - grossly normal Reflexes: 1+ Coordination - grossly normal Gait - grossly normal Balance - grossly normal Cranial Nerves: I: smell Not tested  II: visual acuity  OS: nl    OD: nl  II: visual fields Full to confrontation  II: pupils Equal, round, reactive to light  III,VII: ptosis None  III,IV,VI: extraocular muscles  Full ROM  V: mastication Normal  V: facial light touch sensation  Normal  V,VII: corneal reflex  Present  VII: facial muscle function - upper  Normal  VII: facial muscle function - lower Normal  VIII: hearing Not tested  IX: soft palate elevation  Normal  IX,X: gag reflex Present  XI: trapezius strength  5/5  XI: sternocleidomastoid strength 5/5  XI: neck flexion strength  5/5  XII: tongue strength  Normal    Data Review Lab Results  Component Value Date   WBC 6.7 12/28/2012   HGB 11.5* 12/28/2012   HCT 35.2* 12/28/2012   MCV 88.2  12/28/2012   PLT 263 12/28/2012   Lab Results  Component Value Date   NA 136 12/28/2012   K 3.8 12/28/2012   CL 100 12/28/2012   CO2 27 12/28/2012   BUN 7 12/28/2012   CREATININE 0.81 12/28/2012   GLUCOSE 80 12/28/2012   Lab Results  Component Value Date   INR 0.91 12/28/2012    Assessment/Plan: Patient admitted for PLIF L3-4. Patient has failed conservative therapy.  I explained the condition and procedure to the patient and answered any questions.  Patient wishes to proceed with procedure as planned. Understands risks/ benefits and typical outcomes of procedure.   Aleaya Latona S 01/04/2013 6:50 AM

## 2013-01-04 NOTE — Transfer of Care (Signed)
Immediate Anesthesia Transfer of Care Note  Patient: Victoria Mccarthy  Procedure(s) Performed: Procedure(s) with comments: MAXIMUM ACCESS (MAS) POSTERIOR LUMBAR INTERBODY FUSION (PLIF) 1 LEVEL (N/A) - Posterior Lumbar Three-Four Interbody and Fusion  Patient Location: PACU  Anesthesia Type:General  Level of Consciousness: sedated and responds to stimulation  Airway & Oxygen Therapy: Patient Spontanous Breathing and Patient connected to nasal cannula oxygen  Post-op Assessment: Report given to PACU RN, Post -op Vital signs reviewed and stable and Patient moving all extremities  Post vital signs: Reviewed and stable  Complications: No apparent anesthesia complications

## 2013-01-04 NOTE — Op Note (Signed)
01/04/2013  2:56 PM  PATIENT:  Victoria Mccarthy  67 y.o. female  PRE-OPERATIVE DIAGNOSIS:  L3-4 severe spinal stenosis with instability, back and leg pain  POST-OPERATIVE DIAGNOSIS:  Same  PROCEDURE:   1. Decompressive lumbar laminectomy L3-4 to decompress both the L3 and the L4 nerve roots that were both involved with stenosis bilaterally requiring more work than would be required of the typical PLIF exposure (which by definition is not for decompression) in order to adequately decompress the neural elements.  2. Posterior lumbar interbody fusion L3-4 using PEEK interbody cages packed with morcellized allograft and autograft   3. Posterior fixation L3-4 using  Cortical pedicle screws.    SURGEON:  Marikay Alar, MD  ASSISTANTS: None  ANESTHESIA:  General  EBL: 100 ml  Total I/O In: 1250 [I.V.:1000; IV Piggyback:250] Out: -   BLOOD ADMINISTERED:none  DRAINS: Hemovac   INDICATION FOR PROCEDURE: This patient presented with severe back pain with radiation into her legs. CT myelogram showed a myelographic block at L3-4 with instability on flexion extension. She tried medical management for quite a long time without relief. Recommended a decompression and instrumented fusion. Patient understood the risks, benefits, and alternatives and potential outcomes and wished to proceed.  PROCEDURE DETAILS:  The patient was brought to the operating room. After induction of generalized endotracheal anesthesia the patient was rolled into the prone position on chest rolls and all pressure points were padded. The patient's lumbar region was cleaned and then prepped with DuraPrep and draped in the usual sterile fashion. Anesthesia was injected and then a dorsal midline incision was made and carried down to the lumbosacral fascia. The fascia was opened and the paraspinous musculature was taken down in a subperiosteal fashion to expose L3-4. Intraoperative fluoroscopy confirmed my level, and I started with  placement of the L3 cortical pedicle screws. I localized the pedicle screw entry zones utilizing surface landmarks and AP and lateral fluoroscopy. EMG monitoring was used. I drilled and then tapped in an upward and outward direction and then placed 50 by 30 mm pedicle screws into the pedicles of L3 bilaterally . I then turned my attention to the decompression and the spinous process was removed and complete lumbar laminectomies, hemi- facetectomies, and foraminotomies were performed at L3-4. There was severe stenosis at this level and involved both the L3 and the L4 nerve roots bilaterally. Much more generous foraminotomies and hemi-facetectomies were performed in order to adequately decompress the neural elements than would be required of a simple exposure of the disc for interbody fusion. The yellow ligament was removed to expose the underlying dura and nerve roots, and generous foraminotomies were performed to adequately decompress the neural elements. Once the decompression was complete, I turned my attention to the posterior lower lumbar interbody fusion. The epidural venous vasculature was coagulated and cut sharply. Disc space was incised and the initial discectomy was performed with pituitary rongeurs. The disc space was distracted with sequential distractors to a height of 9 mm. We then used a series of scrapers and shavers to prepare the endplates for fusion. The midline was prepared with Epstein curettes. Once the complete discectomy was finished, we packed an appropriate sized peek interbody cage with local autograft and morcellized allograft, gently retracted the nerve root, and tapped the cage into position at L3-4 I lateral. The midline was packed with morselized autograft and allograft. We then turned our attention to the posterior fixation at L4. The pedicle screw entry zones were identified utilizing surface landmarks  and fluoroscopy. We probed each pedicle with the pedicle probe and tapped each  pedicle with the appropriate tap. We palpated with a ball probe to assure no break in the cortex. We then placed 50 by 30 mm pedicle screws into the pedicles bilaterally at L4.  We then placed lordotic rods into the multiaxial screw heads of the pedicle screws and locked these in position with the locking caps and anti-torque device.  We then checked our construct with AP and lateral fluoroscopy. Irrigated with copious amounts of bacitracin-containing saline solution. Placed a medium Hemovac drain through separate stab incision. Inspected the nerve roots once again to assure adequate decompression, lined to the dura with Gelfoam, and closed the muscle and the fascia with 0 Vicryl. Closed the subcutaneous tissues with 2-0 Vicryl and subcuticular tissues with 3-0 Vicryl. The skin was closed with benzoin and Steri-Strips. Dressing was then applied, the patient was awakened from general anesthesia and transported to the recovery room in stable condition. At the end of the procedure all sponge, needle and instrument counts were correct.   PLAN OF CARE: Admit to inpatient   PATIENT DISPOSITION:  PACU - hemodynamically stable.   Delay start of Pharmacological VTE agent (>24hrs) due to surgical blood loss or risk of bleeding:  yes

## 2013-01-04 NOTE — Anesthesia Postprocedure Evaluation (Signed)
Anesthesia Post Note  Patient: Victoria Mccarthy  Procedure(s) Performed: Procedure(s) (LRB): MAXIMUM ACCESS (MAS) POSTERIOR LUMBAR INTERBODY FUSION (PLIF) 1 LEVEL (N/A)  Anesthesia type: general  Patient location: PACU  Post pain: Pain level controlled  Post assessment: Patient's Cardiovascular Status Stable  Last Vitals:  Filed Vitals:   01/04/13 1545  BP: 149/82  Pulse: 107  Temp:   Resp: 14    Post vital signs: Reviewed and stable  Level of consciousness: sedated  Complications: No apparent anesthesia complications

## 2013-01-04 NOTE — Progress Notes (Signed)
Done by Tomie China RN

## 2013-01-05 MED FILL — Heparin Sodium (Porcine) Inj 1000 Unit/ML: INTRAMUSCULAR | Qty: 30 | Status: AC

## 2013-01-05 MED FILL — Sodium Chloride IV Soln 0.9%: INTRAVENOUS | Qty: 1000 | Status: AC

## 2013-01-05 MED FILL — Sodium Chloride Irrigation Soln 0.9%: Qty: 3000 | Status: AC

## 2013-01-05 NOTE — Progress Notes (Signed)
Utilization review completed. Norine Reddington, RN, BSN. 

## 2013-01-05 NOTE — Plan of Care (Signed)
Problem: Consults Goal: Diagnosis - Spinal Surgery Thoraco/Lumbar Spine Fusion     

## 2013-01-05 NOTE — Evaluation (Signed)
Physical Therapy Evaluation Patient Details Name: Victoria Mccarthy MRN: [id number] DOB: [birth year] Today's Date: 01/05/2013 Time: 4098-1191 PT Time Calculation (min): 24 min  PT Assessment / Plan / Recommendation Clinical Impression  pt rpesents with L3-4 Lami and Decomp.  pt motivated and moving well.  Anticipate good progress.  Reviewed don/doff brace.      PT Assessment  Patient needs continued PT services    Follow Up Recommendations  Home health PT;Supervision - Intermittent    Does the patient have the potential to tolerate intense rehabilitation      Barriers to Discharge None      Equipment Recommendations  Rolling walker with 5" wheels (3-in-1)    Recommendations for Other Services OT consult   Frequency Min 5X/week    Precautions / Restrictions Precautions Precautions: Back;Fall Precaution Booklet Issued: No Required Braces or Orthoses: Spinal Brace Spinal Brace: Lumbar corset;Applied in sitting position Restrictions Weight Bearing Restrictions: No   Pertinent Vitals/Pain 5/10.        Mobility  Bed Mobility Bed Mobility: Rolling Left;Left Sidelying to Sit;Sitting - Scoot to Edge of Bed;Sit to Sidelying Left Rolling Left: 5: Supervision;With rail Left Sidelying to Sit: 4: Min assist Sitting - Scoot to Edge of Bed: 5: Supervision Sit to Sidelying Left: 4: Min assist Details for Bed Mobility Assistance: cues for log roll and back precautions.   Transfers Transfers: Sit to Stand;Stand to Sit Sit to Stand: 4: Min guard;With upper extremity assist;From bed Stand to Sit: 4: Min guard;With upper extremity assist;To bed Details for Transfer Assistance: cues for UE use, getting closer to bed and controlling descent to bed.   Ambulation/Gait Ambulation/Gait Assistance: 4: Min guard Ambulation Distance (Feet): 160 Feet Assistive device: Rolling walker Ambulation/Gait Assistance Details: cues for positioning in RW, upright posture.   Gait Pattern: Step-through  pattern;Decreased stride length Stairs: No Wheelchair Mobility Wheelchair Mobility: No    Exercises     PT Diagnosis: Difficulty walking;Acute pain  PT Problem List: Decreased activity tolerance;Decreased balance;Decreased mobility;Decreased knowledge of use of DME;Decreased knowledge of precautions;Pain PT Treatment Interventions: DME instruction;Gait training;Stair training;Functional mobility training;Therapeutic activities;Therapeutic exercise;Balance training;Neuromuscular re-education;Patient/family education   PT Goals Acute Rehab PT Goals PT Goal Formulation: With patient Time For Goal Achievement: 01/12/13 Potential to Achieve Goals: Good Pt will Roll Supine to Left Side: with modified independence PT Goal: Rolling Supine to Left Side - Progress: Goal set today Pt will go Supine/Side to Sit: with modified independence PT Goal: Supine/Side to Sit - Progress: Goal set today Pt will go Sit to Supine/Side: with modified independence PT Goal: Sit to Supine/Side - Progress: Goal set today Pt will go Sit to Stand: with modified independence PT Goal: Sit to Stand - Progress: Goal set today Pt will go Stand to Sit: with modified independence PT Goal: Stand to Sit - Progress: Goal set today Pt will Ambulate: >150 feet;with modified independence;with rolling walker PT Goal: Ambulate - Progress: Goal set today Pt will Go Up / Down Stairs: 3-5 stairs;with min assist;with least restrictive assistive device PT Goal: Up/Down Stairs - Progress: Goal set today Additional Goals Additional Goal #1: pt will verbalize and follow back precautions.   PT Goal: Additional Goal #1 - Progress: Goal set today  Visit Information  Last PT Received On: 01/05/13 Assistance Needed: +1    Subjective Data  Subjective: I'll do what I have to.   Patient Stated Goal: Ride a bicycle.     Prior Functioning  Home Living Lives With: Alone Available Help at  Discharge: Family;Available 24 hours/day Type of  Home: House Home Access: Stairs to enter [employer] of Steps: 3 Entrance Stairs-Rails: None Home Layout: One level Bathroom Shower/Tub: Engineer, manufacturing systems: Standard Home Adaptive Equipment: None Additional Comments: Pt unsure but she may choose to go stay with her parents where she can have 24/7 sup. Parents house is also one level with 2 steps to get in and one rail. Prior Function Level of Independence: Independent Able to Take Stairs?: Yes Driving: Yes Communication Communication: No difficulties    Cognition  Cognition Arousal/Alertness: Awake/alert Behavior During Therapy: WFL for tasks assessed/performed Overall Cognitive Status: Within Functional Limits for tasks assessed    Extremity/Trunk Assessment Right Lower Extremity Assessment RLE ROM/Strength/Tone: WFL for tasks assessed RLE Sensation: WFL - Light Touch Left Lower Extremity Assessment LLE ROM/Strength/Tone: WFL for tasks assessed LLE Sensation: WFL - Light Touch Trunk Assessment Trunk Assessment: Normal   Balance Balance Balance Assessed: No  End of Session PT - End of Session Equipment Utilized During Treatment: Gait belt;Back brace Activity Tolerance: Patient tolerated treatment well Patient left: in bed;with call bell/phone within reach Nurse Communication: Mobility status  GP     Sunny Schlein, [place] [phone] 01/05/2013, 2:25 PM

## 2013-01-05 NOTE — Evaluation (Addendum)
Occupational Therapy Evaluation Patient Details Name: Victoria Mccarthy MRN: [id number] DOB: 10-Aug-1946 Today's Date: 01/05/2013 Time: 4098-1191 OT Time Calculation (min): 25 min  OT Assessment / Plan / Recommendation Clinical Impression  Pt s/p PLIF 2 level. Will benefit from continued OT services to address below problem list in prep for return home.    OT Assessment  Patient needs continued OT Services    Follow Up Recommendations  Supervision/Assistance - 24 hour;No OT follow up    Barriers to Discharge      Equipment Recommendations  3 in 1 bedside comode    Recommendations for Other Services    Frequency  Min 2X/week    Precautions / Restrictions Precautions Precautions: Back;Fall Precaution Booklet Issued: No Required Braces or Orthoses: Spinal Brace Spinal Brace: Lumbar corset;Applied in sitting position Restrictions Weight Bearing Restrictions: No   Pertinent Vitals/Pain See vitals    ADL  Eating/Feeding: Performed;Independent Where Assessed - Eating/Feeding: Chair Grooming: Performed;Wash/dry hands;Set up Where Assessed - Grooming: Supported sitting Upper Body Bathing: Simulated;Set up Where Assessed - Upper Body Bathing: Unsupported sitting Lower Body Bathing: Simulated;Moderate assistance Where Assessed - Lower Body Bathing: Supported sit to stand Upper Body Dressing: Performed;Minimal assistance Where Assessed - Upper Body Dressing: Unsupported sitting Lower Body Dressing: Performed;Maximal assistance Where Assessed - Lower Body Dressing: Supported sit to stand Toilet Transfer: Mining engineer Method: Surveyor, minerals: Other (comment) (bed to chair) Equipment Used: Gait belt;Back brace;Rolling walker Transfers/Ambulation Related to ADLs: min assist with RW ADL Comments: Pt donned back brace with min assist and verbal cueing for proper positioning.    OT Diagnosis: Generalized weakness;Acute pain  OT  Problem List: Decreased strength;Decreased activity tolerance;Decreased knowledge of use of DME or AE;Decreased knowledge of precautions;Pain OT Treatment Interventions: Self-care/ADL training;DME and/or AE instruction;Therapeutic activities;Patient/family education   OT Goals Acute Rehab OT Goals OT Goal Formulation: With patient Time For Goal Achievement: 01/12/13 Potential to Achieve Goals: Good ADL Goals Pt Will Perform Grooming: with modified independence;Standing at sink ADL Goal: Grooming - Progress: Goal set today Pt Will Perform Lower Body Bathing: with modified independence;Sit to stand from chair;Sit to stand from bed;with adaptive equipment ADL Goal: Lower Body Bathing - Progress: Goal set today Pt Will Perform Lower Body Dressing: with modified independence;Sit to stand from chair;Sit to stand from bed;with adaptive equipment ADL Goal: Lower Body Dressing - Progress: Goal set today Pt Will Transfer to Toilet: with modified independence;Ambulation;with DME;Comfort height toilet;Maintaining back safety precautions ADL Goal: Toilet Transfer - Progress: Goal set today Pt Will Perform Toileting - Clothing Manipulation: with modified independence;Standing ADL Goal: Toileting - Clothing Manipulation - Progress: Goal set today Pt Will Perform Toileting - Hygiene: with modified independence;Sit to stand from 3-in-1/toilet ADL Goal: Toileting - Hygiene - Progress: Goal set today Pt Will Perform Tub/Shower Transfer: Tub transfer;with modified independence;Ambulation;with DME ADL Goal: Tub/Shower Transfer - Progress: Goal set today Miscellaneous OT Goals Miscellaneous OT Goal #1: Pt will perform bed mobility at mod I level as precursor for EOB ADLs. OT Goal: Miscellaneous Goal #1 - Progress: Goal set today  Visit Information  Last OT Received On: 01/05/13 Assistance Needed: +1    Subjective Data      Prior Functioning     Home Living Lives With: Alone Available Help at  Discharge: Family;Available 24 hours/day Type of Home: House Home Access: Stairs to enter [employer] of Steps: 3 Entrance Stairs-Rails: None Home Layout: One level Bathroom Shower/Tub: Engineer, manufacturing systems: Standard Home Adaptive Equipment:  None Additional Comments: Pt unsure but she may choose to go stay with her parents where she can have 24/7 sup. Parents house is also one level with 2 steps to get in and one rail. Prior Function Level of Independence: Independent Able to Take Stairs?: Yes Driving: Yes Communication Communication: No difficulties         Vision/Perception     Cognition  Cognition Arousal/Alertness: Awake/alert Behavior During Therapy: WFL for tasks assessed/performed Overall Cognitive Status: Within Functional Limits for tasks assessed    Extremity/Trunk Assessment Right Upper Extremity Assessment RUE ROM/Strength/Tone: [facility] for tasks assessed Left Upper Extremity Assessment LUE ROM/Strength/Tone: WFL for tasks assessed Right Lower Extremity Assessment RLE ROM/Strength/Tone: WFL for tasks assessed RLE Sensation: WFL - Light Touch Left Lower Extremity Assessment LLE ROM/Strength/Tone: WFL for tasks assessed LLE Sensation: WFL - Light Touch Trunk Assessment Trunk Assessment: Normal     Mobility Bed Mobility Bed Mobility: Rolling Right;Right Sidelying to Sit;Sitting - Scoot to Edge of Bed Rolling Right: 4: Min assist Right Sidelying to Sit: 4: Min assist Sitting - Scoot to Edge of Bed: 4: Min assist Sit to Sidelying Left: 4: Min assist Details for Bed Mobility Assistance: VC for sequencing and assist to support trunk OOB Transfers Transfers: Sit to Stand;Stand to Sit Sit to Stand: 4: Min assist;From bed;With upper extremity assist Stand to Sit: 4: Min guard;To chair/3-in-1;With armrests;With upper extremity assist Details for Transfer Assistance: cues for safe UE placement     Exercise     Balance Balance Balance  Assessed: No   End of Session OT - End of Session Equipment Utilized During Treatment: Gait belt;Back brace Activity Tolerance: Patient tolerated treatment well Patient left: in chair;with call bell/phone within reach Nurse Communication: Mobility status  GO    01/05/2013 Cipriano Mile OTR/L Pager [phone] Office [phone]  Cipriano Mile 01/05/2013, 4:07 PM

## 2013-01-05 NOTE — Progress Notes (Signed)
Subjective: Patient reports sore in back  Objective: Vital signs in last 24 hours: Temp:  [97.8 F (36.6 C)-99.6 F (37.6 C)] 97.8 F (36.6 C) (04/18 0649) Pulse Rate:  [97-109] 100 (04/18 0649) Resp:  [12-20] 18 (04/18 0649) BP: (121-157)/(66-86) 121/75 mmHg (04/18 0649) SpO2:  [95 %-99 %] 98 % (04/18 0649)  Intake/Output from previous day: 04/17 0701 - 04/18 0700 In: 1550 [I.V.:1300; IV Piggyback:250] Out: 3300 [Urine:3200; Blood:100] Intake/Output this shift:    Physical Exam: Dressing CDI.  Strength full, currently on bedpan.    Lab Results: No results found for this basename: WBC, HGB, HCT, PLT,  in the last 72 hours BMET No results found for this basename: NA, K, CL, CO2, GLUCOSE, BUN, CREATININE, CALCIUM,  in the last 72 hours  Studies/Results: Dg Lumbar Spine 2-3 Views  01/04/2013  *RADIOLOGY REPORT*  Clinical Data: Back pain  DG C-ARM 61-120 MIN,LUMBAR SPINE - 2-3 VIEW  Comparison: CT myelogram 11/30/2012.  Findings: C-arm films document L3-4 posterolateral and interbody fusion.  IMPRESSION: As above.   Original Report Authenticated By: Davonna Belling, M.D.    Dg C-arm [phone] Min  01/04/2013  *RADIOLOGY REPORT*  Clinical Data: Back pain  DG C-ARM 61-120 MIN,LUMBAR SPINE - 2-3 VIEW  Comparison: CT myelogram 11/30/2012.  Findings: C-arm films document L3-4 posterolateral and interbody fusion.  IMPRESSION: As above.   Original Report Authenticated By: Davonna Belling, M.D.     Assessment/Plan: Will mobilize with PT in brace which is to be dropped off from [employer] today.    LOS: 1 day    Dorian Heckle, MD 01/05/2013, 9:09 AM

## 2013-01-06 NOTE — Progress Notes (Signed)
Subjective: Patient reports She go very well pain is better controlled she feels stronger all pain she's feeling is in her low back  Objective: Vital signs in last 24 hours: Temp:  [98.1 F (36.7 C)-98.6 F (37 C)] 98.2 F (36.8 C) (04/19 0611) Pulse Rate:  [88-99] 89 (04/19 0611) Resp:  [18] 18 (04/19 0611) BP: (109-146)/(61-80) 139/80 mmHg (04/19 0611) SpO2:  [92 %-100 %] 95 % (04/19 0611)  Intake/Output from previous day:   Intake/Output this shift:    Strength is 5 out of 5 wound is clean and dry  Lab Results: No results found for this basename: WBC, HGB, HCT, PLT,  in the last 72 hours BMET No results found for this basename: NA, K, CL, CO2, GLUCOSE, BUN, CREATININE, CALCIUM,  in the last 72 hours  Studies/Results: Dg Lumbar Spine 2-3 Views  01/04/2013  *RADIOLOGY REPORT*  Clinical Data: Back pain  DG C-ARM 61-120 MIN,LUMBAR SPINE - 2-3 VIEW  Comparison: CT myelogram 11/30/2012.  Findings: C-arm films document L3-4 posterolateral and interbody fusion.  IMPRESSION: As above.   Original Report Authenticated By: Davonna Belling, M.D.    Dg C-arm [phone] Min  01/04/2013  *RADIOLOGY REPORT*  Clinical Data: Back pain  DG C-ARM 61-120 MIN,LUMBAR SPINE - 2-3 VIEW  Comparison: CT myelogram 11/30/2012.  Findings: C-arm films document L3-4 posterolateral and interbody fusion.  IMPRESSION: As above.   Original Report Authenticated By: Davonna Belling, M.D.     Assessment/Plan: Continue to mobilize physical outpatient therapy today plan probable discharge tomorrow  LOS: 2 days     Jones Viviani P 01/06/2013, 8:34 AM

## 2013-01-06 NOTE — Progress Notes (Signed)
Physical Therapy Treatment Patient Details Name: Victoria Mccarthy MRN: [id number] DOB: 1946/08/03 Today's Date: 01/06/2013 Time: 7829-5621 PT Time Calculation (min): 20 min  PT Assessment / Plan / Recommendation Comments on Treatment Session  Great progress today. May be able to d/c home without HHPT. Likely d/c home tomorrow. Patient demonstrating improved independence. Reviewed precautions and education for home.     Follow Up Recommendations  Home health PT (vs none dependending progress at time of d/c)     Does the patient have the potential to tolerate intense rehabilitation     Barriers to Discharge        Equipment Recommendations  Rolling walker with 5" wheels    Recommendations for Other Services    Frequency Min 5X/week   Plan Discharge plan remains appropriate;Frequency remains appropriate    Precautions / Restrictions Precautions Precautions: Back;Fall Precaution Comments: Pt able to recall 3/3 back precautions. Required Braces or Orthoses: Spinal Brace Spinal Brace: Lumbar corset;Applied in sitting position Restrictions Weight Bearing Restrictions: No Other Position/Activity Restrictions: pt applying brace independently   Pertinent Vitals/Pain Reports surgical pain, RN providing pain meds    Mobility  Bed Mobility Bed Mobility: Sit to Sidelying Left;Rolling Right Rolling Right: 6: Modified independent (Device/Increase time) Sit to Sidelying Left: 5: Supervision;HOB flat Details for Bed Mobility Assistance: pt sitting EOB on my arrival, reports she did use the rail to get up; to lie back down she needed minimal sequencing cues and no physical assist for correct technique Transfers Transfers: Sit to Stand;Stand to Sit Sit to Stand: 5: Supervision;From bed;With armrests;With upper extremity assist Stand to Sit: 5: Supervision;To bed Details for Transfer Assistance: good carry over of safe hand placement Ambulation/Gait Ambulation/Gait Assistance: 6: Modified  independent (Device/Increase time) Ambulation Distance (Feet): 250 Feet Assistive device: Rolling walker Ambulation/Gait Assistance Details: cues for relaxed shoulder posture Gait Pattern: Step-through pattern;Decreased stride length Gait velocity: decreased General Gait Details: decreased step height Stairs: Yes Stairs Assistance: 6: Modified independent (Device/Increase time) Stair Management Technique: One rail Right Number of Stairs: 3 (4 inch steps)      PT Goals Acute Rehab PT Goals PT Goal: Supine/Side to Sit - Progress: Progressing toward goal PT Goal: Sit to Supine/Side - Progress: Progressing toward goal PT Goal: Sit to Stand - Progress: Progressing toward goal PT Goal: Stand to Sit - Progress: Progressing toward goal PT Goal: Ambulate - Progress: Progressing toward goal PT Goal: Up/Down Stairs - Progress: Met Additional Goals PT Goal: Additional Goal #1 - Progress: Progressing toward goal  Visit Information  Last PT Received On: 01/06/13 Assistance Needed: +1    Subjective Data  Subjective: I am feeling pretty tired.    Cognition  Cognition Arousal/Alertness: Awake/alert Behavior During Therapy: WFL for tasks assessed/performed Overall Cognitive Status: Within Functional Limits for tasks assessed    Balance     End of Session PT - End of Session Equipment Utilized During Treatment: Gait belt;Back brace Activity Tolerance: Patient tolerated treatment well Patient left: in bed;with call bell/phone within reach Nurse Communication: Mobility status   GP     [facility] HELEN 01/06/2013, 2:31 PM

## 2013-01-06 NOTE — Progress Notes (Signed)
Occupational Therapy Treatment Patient Details Name: Victoria Mccarthy MRN: [id number] DOB: 1946/03/06 Today's Date: 01/06/2013 Time: 8295-6213 OT Time Calculation (min): 27 min  OT Assessment / Plan / Recommendation Comments on Treatment Session Pt making excellent progress. Plan to practice tub transfer next session then will likely be able to sign off.    Follow Up Recommendations  Supervision/Assistance - 24 hour;No OT follow up    Barriers to Discharge       Equipment Recommendations  3 in 1 bedside comode    Recommendations for Other Services    Frequency Min 2X/week   Plan Discharge plan remains appropriate    Precautions / Restrictions Precautions Precautions: Back;Fall Precaution Comments: Pt able to recall 3/3 back precautions. Required Braces or Orthoses: Spinal Brace Spinal Brace: Lumbar corset;Applied in sitting position   Pertinent Vitals/Pain See vitals    ADL  Lower Body Bathing: Simulated;Set up Where Assessed - Lower Body Bathing: Unsupported sit to stand Upper Body Dressing: Performed;Set up Where Assessed - Upper Body Dressing: Unsupported sitting Lower Body Dressing: Performed;Supervision/safety Where Assessed - Lower Body Dressing: Unsupported sit to stand Toilet Transfer: Simulated;Supervision/safety Toilet Transfer Method: Sit to Barista:  (bed) Equipment Used: Gait belt;Back brace;Rolling walker;Reacher;Long-handled sponge;Long-handled shoe horn;Sock aid Transfers/Ambulation Related to ADLs: supervision with RW ADL Comments: Educated pt on use of ADL AE for LB bathing/dressing and pt able to return demonstration with min verbal cues for technique when using reacher and sock aid.  Pt very appreciative of information. Plan to return later in PM and provide pt with indigent AE.  Pt verbalized concern about getting in/out of bed once at home.  Practiced bed mobility with HOB flat and without rails.  Pt more comfortable with bed  mobility by end of session but will continue to address in next session.    OT Diagnosis:    OT Problem List:   OT Treatment Interventions:     OT Goals ADL Goals Pt Will Perform Lower Body Bathing: with modified independence;Sit to stand from chair;Sit to stand from bed;with adaptive equipment ADL Goal: Lower Body Bathing - Progress: Progressing toward goals Pt Will Perform Lower Body Dressing: with modified independence;Sit to stand from chair;Sit to stand from bed;with adaptive equipment ADL Goal: Lower Body Dressing - Progress: Progressing toward goals Pt Will Transfer to Toilet: with modified independence;Ambulation;with DME;Comfort height toilet;Maintaining back safety precautions ADL Goal: Toilet Transfer - Progress: Progressing toward goals Miscellaneous OT Goals Miscellaneous OT Goal #1: Pt will perform bed mobility at mod I level as precursor for EOB ADLs. OT Goal: Miscellaneous Goal #1 - Progress: Progressing toward goals  Visit Information  Last OT Received On: 01/06/13 Assistance Needed: +1    Subjective Data      Prior Functioning       Cognition  Cognition Arousal/Alertness: Awake/alert Behavior During Therapy: WFL for tasks assessed/performed Overall Cognitive Status: Within Functional Limits for tasks assessed    Mobility  Bed Mobility Bed Mobility: Left Sidelying to Sit;Rolling Left;Sitting - Scoot to Edge of Bed;Sit to Sidelying Left Rolling Left: 5: Supervision Left Sidelying to Sit: 5: Supervision;HOB flat Sitting - Scoot to Edge of Bed: 6: Modified independent (Device/Increase time) Sit to Sidelying Left: 5: Supervision;HOB flat Details for Bed Mobility Assistance: Pt performed sidelying<>sit several times in order to become more comfortable with bed mobility tasks.  Step by step verbal cues for sequencing and technique but not physical assist needed. Transfers Transfers: Sit to Stand;Stand to Sit Sit to Stand: 5: Supervision;From  bed;From  chair/3-in-1;With armrests;With upper extremity assist Stand to Sit: 5: Supervision;To bed;With upper extremity assist;To chair/3-in-1;With armrests Details for Transfer Assistance: sup for safety    Exercises      Balance     End of Session OT - End of Session Equipment Utilized During Treatment: Back brace Activity Tolerance: Patient tolerated treatment well Patient left: in chair;with call bell/phone within reach Nurse Communication: Mobility status  GO    01/06/2013 Cipriano Mile OTR/L Pager [phone] Office [phone]  Cipriano Mile 01/06/2013, 10:38 AM

## 2013-01-06 NOTE — Progress Notes (Signed)
Occupational Therapy Treatment Patient Details Name: Victoria Mccarthy MRN: [id number] DOB: 1946/01/26 Today's Date: 01/06/2013 Time: 4098-1191 OT Time Calculation (min): 12 min  OT Assessment / Plan / Recommendation Comments on Treatment Session Pt making good progress but fatigued this afternoon. Will plan to practice tub transfer tomorrow.    Follow Up Recommendations  Supervision/Assistance - 24 hour;No OT follow up    Barriers to Discharge       Equipment Recommendations  3 in 1 bedside comode;Tub/shower seat    Recommendations for Other Services    Frequency Min 2X/week   Plan Discharge plan remains appropriate    Precautions / Restrictions Precautions Precautions: Back;Fall Precaution Comments: Pt able to recall 3/3 back precautions. Required Braces or Orthoses: Spinal Brace Spinal Brace: Lumbar corset;Applied in sitting position Restrictions Weight Bearing Restrictions: No Other Position/Activity Restrictions: pt applying brace independently   Pertinent Vitals/Pain See vitals    ADL  Toilet Transfer: Performed;Modified independent Toilet Transfer Method: Sit to Barista: Comfort height toilet Toileting - Clothing Manipulation and Hygiene: Performed;Modified independent Where Assessed - Toileting Clothing Manipulation and Hygiene: Sit to stand from 3-in-1 or toilet Equipment Used: Gait belt;Back brace Transfers/Ambulation Related to ADLs: mod I with RW within room ADL Comments: Provided pt with indigent AE.  Pt requesting to perform tub transfer next session (fatigued from practicing steps with PT).    OT Diagnosis:    OT Problem List:   OT Treatment Interventions:     OT Goals ADL Goals Pt Will Transfer to Toilet: with modified independence;Ambulation;with DME;Comfort height toilet;Maintaining back safety precautions ADL Goal: Toilet Transfer - Progress: Met Pt Will Perform Toileting - Clothing Manipulation: with modified  independence;Standing ADL Goal: Toileting - Clothing Manipulation - Progress: Met Pt Will Perform Toileting - Hygiene: with modified independence;Sit to stand from 3-in-1/toilet ADL Goal: Toileting - Hygiene - Progress: Met Miscellaneous OT Goals Miscellaneous OT Goal #1: Pt will perform bed mobility at mod I level as precursor for EOB ADLs. OT Goal: Miscellaneous Goal #1 - Progress: Progressing toward goals  Visit Information  Last OT Received On: 01/06/13 Assistance Needed: +1    Subjective Data      Prior Functioning       Cognition  Cognition Arousal/Alertness: Awake/alert Behavior During Therapy: WFL for tasks assessed/performed Overall Cognitive Status: Within Functional Limits for tasks assessed    Mobility  Bed Mobility Bed Mobility: Rolling Left;Left Sidelying to Sit;Sitting - Scoot to [place] of Bed;Sit to Sidelying Left Rolling Left: 6: Modified independent (Device/Increase time) Left Sidelying to Sit: 5: Supervision Sitting - Scoot to Edge of Bed: 6: Modified independent (Device/Increase time) Sit to Sidelying Left: 5: Supervision  Transfers Transfers: Sit to Stand;Stand to Sit Sit to Stand: 6: Modified independent (Device/Increase time);From bed;From toilet;With upper extremity assist Stand to Sit: 6: Modified independent (Device/Increase time);To bed;To toilet;With upper extremity assist    Exercises      Balance     End of Session OT - End of Session Equipment Utilized During Treatment: Back brace Activity Tolerance: Patient limited by fatigue Patient left: in bed;with call bell/phone within reach Nurse Communication: Mobility status  GO     Cipriano Mile 01/06/2013, 3:57 PM

## 2013-01-07 NOTE — Progress Notes (Signed)
Subjective: Patient reports back and hip pain worse today.  Objective: Vital signs in last 24 hours: Temp:  [97.7 F (36.5 C)-98.3 F (36.8 C)] 98 F (36.7 C) (04/20 0524) Pulse Rate:  [72-97] 89 (04/20 0524) Resp:  [15-18] 18 (04/20 0524) BP: (112-151)/(53-72) 151/71 mmHg (04/20 0524) SpO2:  [95 %-98 %] 97 % (04/20 0524)  Intake/Output from previous day: 04/19 0701 - 04/20 0700 In: 240 [P.O.:240] Out: -  Intake/Output this shift:    Physical Exam: Strength full.  Lab Results: No results found for this basename: WBC, HGB, HCT, PLT,  in the last 72 hours BMET No results found for this basename: NA, K, CL, CO2, GLUCOSE, BUN, CREATININE, CALCIUM,  in the last 72 hours  Studies/Results: No results found.  Assessment/Plan: Work with PT and will work on bowels this AM.  Anticipate D/C in AM.    LOS: 3 days    Victoria Mccarthy D, MD 01/07/2013, 7:51 AM

## 2013-01-07 NOTE — Progress Notes (Signed)
Occupational Therapy Treatment Patient Details Name: Victoria Mccarthy MRN: [id number] DOB: [birth year] Today's Date: 01/07/2013 Time: 9562-1308 OT Time Calculation (min): 17 min  OT Assessment / Plan / Recommendation Comments on Treatment Session Pt. progressing with self care activities.  Needs tub seat with back for discharge    Follow Up Recommendations  Supervision/Assistance - 24 hour;No OT follow up    Barriers to Discharge       Equipment Recommendations  3 in 1 bedside comode;Tub/shower seat    Recommendations for Other Services    Frequency Min 2X/week   Plan Discharge plan remains appropriate    Precautions / Restrictions Precautions Precautions: Back;Fall Precaution Comments: Pt able to recall 3/3 back precautions. Required Braces or Orthoses: Spinal Brace Spinal Brace: Lumbar corset;Applied in sitting position Restrictions Weight Bearing Restrictions: No   Pertinent Vitals/Pain     ADL  Tub/Shower Transfer: Supervision/safety Web designer Method: Science writer: Shower seat with back Equipment Used: Back brace;Rolling walker Transfers/Ambulation Related to ADLs: supervision with RW ADL Comments: Pt instructed in safe technique with tub transfers, and was able to perform safely.  Pt reports she feels comfortable with AE use and doesn't feel she needs to practice further    OT Diagnosis:    OT Problem List:   OT Treatment Interventions:     OT Goals ADL Goals ADL Goal: Tub/Shower Transfer - Progress: Progressing toward goals Miscellaneous OT Goals OT Goal: Miscellaneous Goal #1 - Progress: Progressing toward goals  Visit Information  Last OT Received On: 01/07/13 Assistance Needed: +1    Subjective Data      Prior Functioning       Cognition  Cognition Arousal/Alertness: Awake/alert Behavior During Therapy: WFL for tasks assessed/performed Overall Cognitive Status: Within Functional Limits for tasks assessed     Mobility  Bed Mobility Bed Mobility: Rolling Left;Left Sidelying to Sit Rolling Left: 6: Modified independent (Device/Increase time);With rail Right Sidelying to Sit: 6: Modified independent (Device/Increase time) Left Sidelying to Sit: 6: Modified independent (Device/Increase time);With rails Sitting - Scoot to Edge of Bed: 6: Modified independent (Device/Increase time) Sit to Sidelying Left: 5: Supervision;HOB flat Details for Bed Mobility Assistance: demonstrated good technique Transfers Transfers: Sit to Stand;Stand to Sit Sit to Stand: 5: Supervision;With upper extremity assist;From bed;From chair/3-in-1 Stand to Sit: 5: Supervision;With upper extremity assist;To chair/3-in-1;To bed Details for Transfer Assistance: sup for safety    Exercises      Balance     End of Session OT - End of Session Equipment Utilized During Treatment: Back brace Activity Tolerance: Patient tolerated treatment well Patient left: in chair;with call bell/phone within reach  GO     Victoria Mccarthy M 01/07/2013, 12:37 PM

## 2013-01-07 NOTE — Progress Notes (Signed)
Physical Therapy Treatment Patient Details Name: Victoria Mccarthy MRN: [id number] DOB: 1945/12/21 Today's Date: 01/07/2013 Time: 7829-5621 PT Time Calculation (min): 19 min  PT Assessment / Plan / Recommendation Comments on Treatment Session  Pt continues to make steady progress towards PT goals at this time. Encouraged pt to ambulate 2 more times with nsg today.  Will continue to see as indicated.    Follow Up Recommendations  Home health PT (vs none dependending progress at time of d/c)     Does the patient have the potential to tolerate intense rehabilitation     Barriers to Discharge        Equipment Recommendations  Rolling walker with 5" wheels    Recommendations for Other Services    Frequency Min 5X/week   Plan Discharge plan remains appropriate;Frequency remains appropriate    Precautions / Restrictions Precautions Precautions: Back;Fall Precaution Comments: Pt able to recall 3/3 back precautions. Required Braces or Orthoses: Spinal Brace Spinal Brace: Lumbar corset;Applied in sitting position Restrictions Weight Bearing Restrictions: No   Pertinent Vitals/Pain No pain at this time; just had pain medicine    Mobility  Bed Mobility Bed Mobility: Left Sidelying to Sit;Rolling Left;Sitting - Scoot to Edge of Bed;Sit to Sidelying Left Rolling Left: 5: Supervision Left Sidelying to Sit: 5: Supervision;HOB flat Sitting - Scoot to Edge of Bed: 6: Modified independent (Device/Increase time) Sit to Sidelying Left: 5: Supervision;HOB flat Details for Bed Mobility Assistance: Pt performed sidelying<>sit several times in order to become more comfortable with bed mobility tasks.  Step by step verbal cues for sequencing and technique but not physical assist needed. Transfers Transfers: Sit to Stand;Stand to Sit Sit to Stand: 5: Supervision;From bed;From chair/3-in-1;With armrests;With upper extremity assist Stand to Sit: 5: Supervision;To bed;With upper extremity assist;To  chair/3-in-1;With armrests Details for Transfer Assistance: sup for safety Ambulation/Gait Ambulation/Gait Assistance: 6: Modified independent (Device/Increase time) Ambulation Distance (Feet): 400 Feet Assistive device: Rolling walker Gait Pattern: Step-through pattern;Decreased stride length Gait velocity: decreased General Gait Details: decreased step height      PT Goals Acute Rehab PT Goals PT Goal Formulation: With patient Time For Goal Achievement: 01/12/13 Potential to Achieve Goals: Good Pt will Roll Supine to Left Side: with modified independence PT Goal: Rolling Supine to Left Side - Progress: Progressing toward goal Pt will go Supine/Side to Sit: with modified independence PT Goal: Supine/Side to Sit - Progress: Progressing toward goal Pt will go Sit to Supine/Side: with modified independence PT Goal: Sit to Supine/Side - Progress: Progressing toward goal Pt will go Sit to Stand: with modified independence PT Goal: Sit to Stand - Progress: Progressing toward goal Pt will go Stand to Sit: with modified independence PT Goal: Stand to Sit - Progress: Progressing toward goal Pt will Ambulate: >150 feet;with modified independence;with rolling walker PT Goal: Ambulate - Progress: Progressing toward goal Pt will Go Up / Down Stairs: 3-5 stairs;with min assist;with least restrictive assistive device PT Goal: Up/Down Stairs - Progress: Met Additional Goals Additional Goal #1: pt will verbalize and follow back precautions.   PT Goal: Additional Goal #1 - Progress: Progressing toward goal  Visit Information  Last PT Received On: 01/07/13 Assistance Needed: +1    Subjective Data  Subjective: I feel like i may have over did it a bit yesterday   Cognition  Cognition Arousal/Alertness: Awake/alert Behavior During Therapy: WFL for tasks assessed/performed Overall Cognitive Status: Within Functional Limits for tasks assessed       End of Session PT - End  of  Session Equipment Utilized During Treatment: Gait belt;Back brace Activity Tolerance: Patient tolerated treatment well Patient left: in bed;with call bell/phone within reach Nurse Communication: Mobility status   GP     Fabio Asa 01/07/2013, 10:43 AM Charlotte Crumb, PT DPT  [phone]

## 2013-01-07 NOTE — Progress Notes (Signed)
Patient says that her mom just got discharged from a hospital yesterday, and unfortunately her dad is also hospitalized at this time (they were going to assist her when she was discharged.) Also patient says she feels overall worse today and would not like to be discharged today as she is in a lot more pain.   Minor, Victoria Mccarthy

## 2013-01-08 MED ORDER — OXYCODONE-ACETAMINOPHEN 10-325 MG PO TABS: 1.0000 | ORAL_TABLET | ORAL | Status: DC | PRN

## 2013-01-08 MED ORDER — CYCLOBENZAPRINE HCL 10 MG PO TABS: 10.0000 mg | ORAL_TABLET | Freq: Three times a day (TID) | ORAL | Status: DC | PRN

## 2013-01-08 NOTE — Progress Notes (Signed)
PT Cancellation Note  Patient Details Name: Victoria Mccarthy MRN: [id number] DOB: 04/04/1946   Cancelled Treatment:    Reason Eval/Treat Not Completed: Patient is to d/c home today. Spoke with patient briefly who denies any concerns with mobility. Feels independent with bed mobility, gait and stairs and declines practicing any further today. She also does not feel she needs HHPT. Patient informed that if she gets home and feels uncomfortable with mobility to call her doctor and ask for HHPT. Acute PT signing off at this time.    [facility] HELEN 01/08/2013, 8:38 AM Pager: [phone]

## 2013-01-08 NOTE — Discharge Summary (Signed)
Physician Discharge Summary  Patient ID: Victoria Mccarthy MRN: [id number] DOB/AGE: 03-20-1946 67 y.o.  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 The patient tolerated the procedure well and was taken to the recovery room and then to the floor in stable condition. The hospital course was routine. There were no complications. The wound remained clean dry and intact. Pt had appropriate back soreness. No complaints of leg pain or new N/T/W. The patient remained afebrile with stable vital signs, and tolerated a regular diet. The patient continued to increase activities, and pain was well controlled with oral pain medications.   Consults: None  Significant Diagnostic Studies:  Results for orders placed during the hospital encounter of 12/28/12  SURGICAL PCR SCREEN      Result Value Range   MRSA, PCR NEGATIVE  NEGATIVE   Staphylococcus aureus NEGATIVE  NEGATIVE  BASIC METABOLIC PANEL      Result Value Range   Sodium 136  135 - 145 mEq/L   Potassium 3.8  3.5 - 5.1 mEq/L   Chloride 100  96 - 112 mEq/L   CO2 27  19 - 32 mEq/L   Glucose, Bld 80  70 - 99 mg/dL   BUN 7  6 - 23 mg/dL   Creatinine, Ser 4.09  0.50 - 1.10 mg/dL   Calcium 9.1  8.4 - 81.1 mg/dL   GFR calc non Af Amer 75 (*) >90 mL/min   GFR calc Af Amer 87 (*) >90 mL/min  CBC WITH DIFFERENTIAL      Result Value Range   WBC 6.7  4.0 - 10.5 K/uL   RBC 3.99  3.87 - 5.11 MIL/uL   Hemoglobin 11.5 (*) 12.0 - 15.0 g/dL   HCT 91.4 (*) 78.2 - 95.6 %   MCV 88.2  78.0 - 100.0 fL   MCH 28.8  26.0 - 34.0 pg   MCHC 32.7  30.0 - 36.0 g/dL   RDW 21.3  08.6 - 57.8 %   Platelets 263  150 - 400 K/uL   Neutrophils Relative 60  43 - 77 %   Neutro Abs 4.0  1.7 - 7.7 K/uL   Lymphocytes Relative 31  12 - 46 %    Lymphs Abs 2.0  0.7 - 4.0 K/uL   Monocytes Relative 9  3 - 12 %   Monocytes Absolute 0.6  0.1 - 1.0 K/uL   Eosinophils Relative 0  0 - 5 %   Eosinophils Absolute 0.0  0.0 - 0.7 K/uL   Basophils Relative 0  0 - 1 %   Basophils Absolute 0.0  0.0 - 0.1 K/uL  PROTIME-INR      Result Value Range   Prothrombin Time 12.2  11.6 - 15.2 seconds   INR 0.91  0.00 - 1.49  TYPE AND SCREEN      Result Value Range   ABO/RH(D) O POS     Antibody Screen NEG     Sample Expiration 01/11/2013    ABO/RH      Result Value Range   ABO/RH(D) O POS      Chest 2 View  12/28/2012  *RADIOLOGY REPORT*  Clinical Data: Preoperative evaluation prior to PLIF.  CHEST - 2 VIEW  Comparison: Chest x-ray 06/18/2011.  Findings: Linear bibasilar opacities are unchanged and most compatible with  areas of mild chronic scarring.  No acute consolidative airspace disease.  No pleural effusions.  No suspicious appearing pulmonary nodules or masses.  Heart size is normal.  Mediastinal contours are unremarkable.  Atherosclerosis in the thoracic aorta.  IMPRESSION: 1.  No radiographic evidence of acute cardiopulmonary disease. 2.  Atherosclerosis.   Original Report Authenticated By: Trudie Reed, M.D.    Dg Lumbar Spine 2-3 Views  01/04/2013  *RADIOLOGY REPORT*  Clinical Data: Back pain  DG C-ARM 61-120 MIN,LUMBAR SPINE - 2-3 VIEW  Comparison: CT myelogram 11/30/2012.  Findings: C-arm films document L3-4 posterolateral and interbody fusion.  IMPRESSION: As above.   Original Report Authenticated By: Davonna Belling, M.D.    Dg C-arm [phone] Min  01/04/2013  *RADIOLOGY REPORT*  Clinical Data: Back pain  DG C-ARM 61-120 MIN,LUMBAR SPINE - 2-3 VIEW  Comparison: CT myelogram 11/30/2012.  Findings: C-arm films document L3-4 posterolateral and interbody fusion.  IMPRESSION: As above.   Original Report Authenticated By: Davonna Belling, M.D.     Antibiotics:  Anti-infectives   Start     Dose/Rate Route Frequency Ordered Stop   01/04/13 2000   ceFAZolin (ANCEF) IVPB 1 g/50 mL premix     1 g 100 mL/hr over 30 Minutes Intravenous Every 8 hours 01/04/13 1745 01/05/13 0621   01/04/13 1230  bacitracin 50,000 Units in sodium chloride irrigation 0.9 % 500 mL irrigation  Status:  Discontinued       As needed 01/04/13 1330 01/04/13 1505   01/04/13 1158  bacitracin 62952 UNITS injection    Comments:  AURAND, BRANDI: cabinet override      01/04/13 1158 01/05/13 0014   01/04/13 0600  ceFAZolin (ANCEF) IVPB 2 g/50 mL premix     2 g 100 mL/hr over 30 Minutes Intravenous On call to O.R. 01/03/13 1406 01/04/13 1240      Discharge Exam: Blood pressure 119/62, pulse 76, temperature 97.9 F (36.6 C), temperature source Oral, resp. rate 15, SpO2 97.00%. Neurologic: Grossly normal Incision CDI  Discharge Medications:     Medication List    TAKE these medications       ALPRAZolam 1 MG 24 hr tablet  Commonly known as:  XANAX XR  Take 1 mg by mouth 2 (two) times daily.     cholestyramine 4 GM/DOSE powder  Commonly known as:  QUESTRAN  Take 4 g by mouth every evening. Do not take within two hours of other meds.     cyclobenzaprine 10 MG tablet  Commonly known as:  FLEXERIL  Take 1 tablet (10 mg total) by mouth 3 (three) times daily as needed for muscle spasms.     gabapentin 300 MG capsule  Commonly known as:  NEURONTIN  Take 300 mg by mouth 3 (three) times daily.     multivitamin with minerals Tabs  Take 1 tablet by mouth every evening.     nitroGLYCERIN 0.4 MG SL tablet  Commonly known as:  NITROSTAT  Place 0.4 mg under the tongue every 5 (five) minutes as needed for chest pain.     olanzapine-FLUoxetine 3-25 MG per capsule  Commonly known as:  SYMBYAX  Take 4 capsules by mouth every evening.     oxyCODONE-acetaminophen 10-325 MG per tablet  Commonly known as:  PERCOCET  Take 1 tablet by mouth every 4 (four) hours as needed for pain.     rOPINIRole 1 MG tablet  Commonly known as:  REQUIP  Take 1 mg by mouth at bedtime.  solifenacin 10 MG tablet  Commonly known as:  VESICARE  Take 10 mg by mouth at bedtime.        Disposition: home   Final Dx: PLIF      Discharge Orders   Future Orders Complete By Expires     Call MD for:  difficulty breathing, headache or visual disturbances  As directed     Call MD for:  persistant nausea and vomiting  As directed     Call MD for:  redness, tenderness, or signs of infection (pain, swelling, redness, odor or green/yellow discharge around incision site)  As directed     Call MD for:  severe uncontrolled pain  As directed     Call MD for:  temperature >100.4  As directed     Diet - low sodium heart healthy  As directed     Discharge instructions  As directed     Comments:      No bending, twisting, or heavy lifting. May shower normally. No driving.    Increase activity slowly  As directed        Follow-up Information   Follow up with Blenda Wisecup S, MD In 2 weeks.   Contact information:   [address] [place] [address] [phone]        Signed: Tia Alert 01/08/2013, 8:02 AM

## 2013-01-08 NOTE — Care Management Note (Addendum)
    Page 1 of 1   01/11/2013     11:06:24 AM   CARE MANAGEMENT NOTE 01/11/2013  Patient:  Victoria Mccarthy, Victoria Mccarthy   Account Number:  [id number]  Date Initiated:  01/08/2013  Documentation initiated by:  [facility]  Subjective/Objective Assessment:   admitted postop PLIF L3-4     Action/Plan:   PT/OT evals   Anticipated DC Date:  01/08/2013   Anticipated DC Plan:  HOME/SELF CARE      DC Planning Services  CM consult      Choice offered to / List presented to:     DME arranged  Levan Hurst      DME agency  [facility]        Status of service:  Completed, signed off Medicare Important Message given?   (If response is "NO", the following Medicare IM given date fields will be blank) Date Medicare IM given:   Date Additional Medicare IM given:    Discharge Disposition:  HOME/SELF CARE  Per UR Regulation:  Reviewed for med. necessity/level of care/duration of stay  If discussed at Long Length of Stay Meetings, dates discussed:    Comments:  01/11/13 Received notification that patient voiced concern about not having HHC and needing a tub bench, when she received her hospital follow up call.Per PT eval patient was not wanting PT and OT did not recommended HHOT. Dr. Yetta Barre did not order [facility] upon discharge. Contacted Dr. Yetta Barre' office and told by nurse to have patient call the office regarding the tub bench. Contacted Ms Galvis and explained that therapy had not been recommended nor ordered. She stated that that was fine because she is getting around well but that she would like a tub bench. I explained that Dr. Yetta Barre would need to order that for Medicaid to cover it. Advised her to call his office and that she could take the order to any medical equipment store. She thanked me for calling and stated that she would call Dr. Yetta Barre office.Jacquelynn Cree RN, BSN, CCM

## 2013-02-28 ENCOUNTER — Emergency Department (HOSPITAL_COMMUNITY): Payer: Medicaid Other

## 2013-02-28 ENCOUNTER — Other Ambulatory Visit: Payer: Self-pay

## 2013-02-28 ENCOUNTER — Emergency Department (HOSPITAL_COMMUNITY)
Admission: EM | Admit: 2013-02-28 | Discharge: 2013-02-28 | Disposition: A | Discharge status: Home / Self Care | Payer: Medicaid Other | Attending: Emergency Medicine | Admitting: Emergency Medicine

## 2013-02-28 ENCOUNTER — Encounter (HOSPITAL_COMMUNITY): Payer: Self-pay

## 2013-02-28 DIAGNOSIS — F319 Bipolar disorder, unspecified: Secondary | ICD-10-CM | POA: Insufficient documentation

## 2013-02-28 DIAGNOSIS — R42 Dizziness and giddiness: Secondary | ICD-10-CM | POA: Insufficient documentation

## 2013-02-28 DIAGNOSIS — Z8669 Personal history of other diseases of the nervous system and sense organs: Secondary | ICD-10-CM | POA: Insufficient documentation

## 2013-02-28 DIAGNOSIS — Z91040 Latex allergy status: Secondary | ICD-10-CM | POA: Insufficient documentation

## 2013-02-28 DIAGNOSIS — Z8719 Personal history of other diseases of the digestive system: Secondary | ICD-10-CM | POA: Insufficient documentation

## 2013-02-28 DIAGNOSIS — Z862 Personal history of diseases of the blood and blood-forming organs and certain disorders involving the immune mechanism: Secondary | ICD-10-CM | POA: Insufficient documentation

## 2013-02-28 DIAGNOSIS — R011 Cardiac murmur, unspecified: Secondary | ICD-10-CM | POA: Insufficient documentation

## 2013-02-28 DIAGNOSIS — R5381 Other malaise: Secondary | ICD-10-CM | POA: Insufficient documentation

## 2013-02-28 DIAGNOSIS — R072 Precordial pain: Secondary | ICD-10-CM | POA: Insufficient documentation

## 2013-02-28 DIAGNOSIS — R079 Chest pain, unspecified: Secondary | ICD-10-CM

## 2013-02-28 DIAGNOSIS — G2581 Restless legs syndrome: Secondary | ICD-10-CM | POA: Insufficient documentation

## 2013-02-28 DIAGNOSIS — F411 Generalized anxiety disorder: Secondary | ICD-10-CM | POA: Insufficient documentation

## 2013-02-28 DIAGNOSIS — Z79899 Other long term (current) drug therapy: Secondary | ICD-10-CM | POA: Insufficient documentation

## 2013-02-28 DIAGNOSIS — Z8679 Personal history of other diseases of the circulatory system: Secondary | ICD-10-CM | POA: Insufficient documentation

## 2013-02-28 DIAGNOSIS — R531 Weakness: Secondary | ICD-10-CM

## 2013-02-28 DIAGNOSIS — Z8739 Personal history of other diseases of the musculoskeletal system and connective tissue: Secondary | ICD-10-CM | POA: Insufficient documentation

## 2013-02-28 DIAGNOSIS — Z85528 Personal history of other malignant neoplasm of kidney: Secondary | ICD-10-CM | POA: Insufficient documentation

## 2013-02-28 LAB — CBC WITH DIFFERENTIAL/PLATELET
Basophils Absolute: 0 10*3/uL (ref 0.0–0.1)
Basophils Relative: 0 % (ref 0–1)
Eosinophils Relative: 0 % (ref 0–5)
HCT: 36.4 % (ref 36.0–46.0)
MCHC: 33.2 g/dL (ref 30.0–36.0)
MCV: 85.4 fL (ref 78.0–100.0)
Monocytes Absolute: 0.4 10*3/uL (ref 0.1–1.0)
Monocytes Relative: 8 % (ref 3–12)
RDW: 13.9 % (ref 11.5–15.5)

## 2013-02-28 LAB — TROPONIN I: Troponin I: <0.3 ng/mL (ref <0.30)

## 2013-02-28 LAB — BASIC METABOLIC PANEL
BUN: 8 mg/dL (ref 6–23)
CO2: 25 mEq/L (ref 19–32)
Calcium: 9.5 mg/dL (ref 8.4–10.5)
Creatinine, Ser: 0.84 mg/dL (ref 0.50–1.10)
GFR calc Af Amer: 83 mL/min — ABNORMAL LOW (ref >90)

## 2013-02-28 MED ORDER — SODIUM CHLORIDE 0.9 % IV BOLUS (SEPSIS)
1000.0000 mL | Freq: Once | INTRAVENOUS | Status: AC
  Administered 2013-02-28: 1000 mL via INTRAVENOUS

## 2013-02-28 MED ORDER — ASPIRIN 81 MG PO CHEW
324.0000 mg | CHEWABLE_TABLET | Freq: Once | ORAL | Status: AC
  Administered 2013-02-28: 324 mg via ORAL
  Filled 2013-02-28: qty 4

## 2013-02-28 NOTE — ED Provider Notes (Signed)
History  This chart was scribed for Victoria Lennert, MD by Bennett Scrape, ED Scribe. This patient was seen in room APA18/APA18 and the patient's care was started at 9:27 AM.  CSN: [id number]  Arrival date & time 02/28/13  4098   First MD Initiated Contact with Patient 02/28/13 [phone]      Chief Complaint  Patient presents with  . Chest Pain     Patient is a 67 y.o. female presenting with chest pain. The history is provided by the patient. No language interpreter was used.  Chest Pain Pain location:  Substernal area Pain quality comment:  Spasms Pain radiates to:  Does not radiate Pain radiates to the back: no   Onset quality:  Gradual Duration:  40 minutes Timing:  Constant Progression:  Partially resolved Chronicity:  Recurrent Relieved by:  Nitroglycerin Worsened by:  Nothing tried Associated symptoms: no abdominal pain, no back pain, no cough, no fatigue and no headache   Risk factors: no prior DVT/PE and no smoking     HPI Comments: Victoria Mccarthy is a 67 y.o. female who presents to the Emergency Department complaining of approximately 40 minutes of non-radiating substernal CP described as esophageal spasms. She states that she took one nitro with improvement and then stepped out of her father's room in the ED and felt lightheaded. She states that "everything went dark" but denies syncope. She states that she has not eaten anything yet today due to having to bring her father to the ED for evaluation. She also admits to having prior episodes of the same at least once a week but reports that they usually don't occur after taking one nitro. She denies nausea, emesis and diaphoresis as associated symptoms. Pt denies smoking and alcohol use.  Past Medical History  Diagnosis Date  . PONV (postoperative nausea and vomiting)   . Arthritis     chronic back pain  . Diarrhea     incontinent stools  . Nocturia   . Urinary frequency   . Anemia   . Depression   . Psychotic  affective disorder     "psychotic delusions" "I cut myself"   . Restless leg syndrome   . Rheumatic fever   . MVP (mitral valve prolapse)   . Bipolar disorder   . Osteoporosis   . Osteoarthritis   . Diverticulitis     treated at least 5 times in past, hospitalized twice  . Cancer Jan 2013    kidney cancer, left, s/p partial nephrectomy  . Heart murmur   . GERD (gastroesophageal reflux disease)     esophageal spasms - takes nitroglycerin for this  . Anxiety   . Neuropathy     Past Surgical History  Procedure Laterality Date  . Tonsillectomy    . Abdominal hysterectomy    . Cholecystectomy    . Carpell tunnell      rt wrist  x2  . Nissen fundoplication    . Rotator cuff repair    . Robot assisted laparoscopic nephrectomy  08/23/2011    Procedure: ROBOTIC ASSISTED LAPAROSCOPIC NEPHRECTOMY;  Surgeon: Crecencio Mc, MD;  Location: [facility];  Service: Urology;  Laterality: Left;  Left Robotic Assisted  Laparoscopic Partial Nephrectomy   . Esophagogastroduodenoscopy  04/06/2010    intact Nissen fundoplication S/P dilation, somewhat baggy atonic appearing esophagus, 58-F dilation  . Esophagogastroduodenoscopy  1/11    nomral esophagus s/p 54-F Maloney dilation, normal/intact Nissen fundoplication, diffuse patchy erythema and erosions likely NSAID/ASA effect with benign biopsy  .  Colonoscopy  5/02    pancolonic deverticula, internal hemorrhoids  . Colonoscopy  1/11    single external hemorrhoidal tag and anal papilla otherwise normal rectum, pancolonic diverticula, s/p sigmoid biopsy and stool sampling all unremarkable  . Back surgery    . Kidney surgery for cancer      No family history on file.  History  Substance Use Topics  . Smoking status: Never Smoker   . Smokeless tobacco: Not on file  . Alcohol Use: No    No OB history provided.  Review of Systems  Constitutional: Negative for appetite change and fatigue.  HENT: Negative for congestion, sinus pressure and ear  discharge.   Eyes: Negative for discharge.  Respiratory: Negative for cough.   Cardiovascular: Positive for chest pain.  Gastrointestinal: Negative for abdominal pain and diarrhea.  Genitourinary: Negative for frequency and hematuria.  Musculoskeletal: Negative for back pain.  Skin: Negative for rash.  Neurological: Positive for light-headedness. Negative for seizures and headaches.  Psychiatric/Behavioral: Negative for hallucinations.    Allergies  Shellfish allergy; Neurontin; Codeine; Haloperidol lactate; Hydromorphone hcl; Latex; and Propoxyphene-acetaminophen  Home Medications   Current Outpatient Rx  Name  Route  Sig  Dispense  Refill  . cholestyramine (QUESTRAN) 4 GM/DOSE powder   Oral   Take 4 g by mouth every evening. Do not take within two hours of other meds.         . cyclobenzaprine (FLEXERIL) 10 MG tablet   Oral   Take 1 tablet (10 mg total) by mouth 3 (three) times daily as needed for muscle spasms.   60 tablet   1   . Multiple Vitamin (MULTIVITAMIN WITH MINERALS) TABS   Oral   Take 1 tablet by mouth every evening.         . nitroGLYCERIN (NITROSTAT) 0.4 MG SL tablet   Sublingual   Place 0.4 mg under the tongue every 5 (five) minutes as needed for chest pain.         Marland Kitchen rOPINIRole (REQUIP) 1 MG tablet   Oral   Take 1 mg by mouth at bedtime.         . solifenacin (VESICARE) 10 MG tablet   Oral   Take 10 mg by mouth at bedtime.            Triage Vitals: BP 121/74  Pulse 104  Temp(Src) 97.8 F (36.6 C) (Oral)  Resp 16  Ht 5' 5.5" (1.664 m)  Wt 175 lb (79.379 kg)  BMI 28.67 kg/m2  SpO2 93%  Physical Exam  Nursing note and vitals reviewed. Constitutional: She is oriented to person, place, and time. She appears well-developed and well-nourished.  HENT:  Head: Normocephalic and atraumatic.  Eyes: Conjunctivae and EOM are normal. No scleral icterus.  Neck: Neck supple. No thyromegaly present.  Cardiovascular: Normal rate and regular  rhythm.  Exam reveals no gallop and no friction rub.   No murmur heard. Pulmonary/Chest: Effort normal and breath sounds normal. No stridor. She has no wheezes. She has no rales. She exhibits no tenderness.  Abdominal: Soft. She exhibits no distension. There is no tenderness. There is no rebound.  Musculoskeletal: Normal range of motion. She exhibits no edema.  Lymphadenopathy:    She has no cervical adenopathy.  Neurological: She is alert and oriented to person, place, and time. Coordination normal.  Skin: Skin is warm and dry. No rash noted. No erythema.  Psychiatric: She has a normal mood and affect. Her behavior is normal.  ED Course  Procedures (including critical care time)  Medications  aspirin chewable tablet 324 mg (324 mg Oral Given 02/28/13 0930)    DIAGNOSTIC STUDIES: Oxygen Saturation is 93% on room air, adequate by my interpretation.    COORDINATION OF CARE: 9:34 AM-Discussed treatment plan which includes ASA, CXR, CBC panel, BMP and troponin with pt at bedside and pt agreed to plan.   11:42 AM-Pt informed of radiology and lab results. Advised pt that her symptoms were most likely from not eating this morning. Discussed discharge plans with pt and pt agreed.   Labs Reviewed  BASIC METABOLIC PANEL - Abnormal; Notable for the following:    Sodium 132 (*)    Chloride 94 (*)    Glucose, Bld 132 (*)    GFR calc non Af Amer 72 (*)    GFR calc Af Amer 83 (*)    All other components within normal limits  CBC WITH DIFFERENTIAL  TROPONIN I   Dg Chest Portable 1 View  02/28/2013   *RADIOLOGY REPORT*  Clinical Data: Chest pain.  PORTABLE CHEST - 1 VIEW  Comparison: 12/28/2012.  Findings: Trachea is midline.  Heart size normal.  Linear scarring in the lingula.  Lungs are low in volume but otherwise clear.  No pleural fluid.  IMPRESSION: No acute findings.   Original Report Authenticated By: Leanna Battles, M.D.     No diagnosis found.  Date: 02/28/2013  Rate:106   Rhythm: sinus tachycardia  QRS Axis: normal  Intervals: normal  ST/T Wave abnormalities: normal  Conduction Disutrbances:none  Narrative Interpretation:   Old EKG Reviewed: unchanged     MDM  Weakness for nitro and not eating.  Pain is her spasm pain     The chart was scribed for me under my direct supervision.  I personally performed the history, physical, and medical decision making and all procedures in the evaluation of this patient.Victoria Lennert, MD 02/28/13 1150

## 2013-02-28 NOTE — ED Notes (Signed)
Pt here with family. States she started having pain in the center of her chest about 20 minutes ago. States she took 1 ntg sl. Pain 4/10 now

## 2013-02-28 NOTE — ED Notes (Signed)
Pt states she stepped outside her dad's door so he could use the bathroom and everything went black. Pt had taken a ntg for chest pain but said she had never had any problems before with taking ntg

## 2013-06-22 DIAGNOSIS — M545 Low back pain, unspecified: Secondary | ICD-10-CM | POA: Diagnosis not present

## 2013-07-10 DIAGNOSIS — H538 Other visual disturbances: Secondary | ICD-10-CM | POA: Diagnosis not present

## 2013-07-10 DIAGNOSIS — H2510 Age-related nuclear cataract, unspecified eye: Secondary | ICD-10-CM | POA: Diagnosis not present

## 2013-07-10 DIAGNOSIS — Z961 Presence of intraocular lens: Secondary | ICD-10-CM | POA: Diagnosis not present

## 2013-08-02 DIAGNOSIS — F411 Generalized anxiety disorder: Secondary | ICD-10-CM | POA: Diagnosis not present

## 2013-08-02 DIAGNOSIS — F314 Bipolar disorder, current episode depressed, severe, without psychotic features: Secondary | ICD-10-CM | POA: Diagnosis not present

## 2013-10-02 DIAGNOSIS — S43429A Sprain of unspecified rotator cuff capsule, initial encounter: Secondary | ICD-10-CM | POA: Diagnosis not present

## 2013-10-02 DIAGNOSIS — M25519 Pain in unspecified shoulder: Secondary | ICD-10-CM | POA: Diagnosis not present

## 2013-10-11 DIAGNOSIS — S43429A Sprain of unspecified rotator cuff capsule, initial encounter: Secondary | ICD-10-CM | POA: Diagnosis not present

## 2013-10-18 DIAGNOSIS — S43429A Sprain of unspecified rotator cuff capsule, initial encounter: Secondary | ICD-10-CM | POA: Diagnosis not present

## 2013-10-23 ENCOUNTER — Other Ambulatory Visit: Payer: Self-pay | Admitting: Urgent Care

## 2013-10-23 ENCOUNTER — Other Ambulatory Visit: Payer: Self-pay | Admitting: Urology

## 2013-10-23 ENCOUNTER — Ambulatory Visit (INDEPENDENT_AMBULATORY_CARE_PROVIDER_SITE_OTHER): Payer: Medicare Other | Admitting: Urology

## 2013-10-23 DIAGNOSIS — C649 Malignant neoplasm of unspecified kidney, except renal pelvis: Secondary | ICD-10-CM

## 2013-10-25 DIAGNOSIS — F314 Bipolar disorder, current episode depressed, severe, without psychotic features: Secondary | ICD-10-CM | POA: Diagnosis not present

## 2013-10-25 DIAGNOSIS — F411 Generalized anxiety disorder: Secondary | ICD-10-CM | POA: Diagnosis not present

## 2013-11-01 ENCOUNTER — Ambulatory Visit (HOSPITAL_COMMUNITY)
Admission: RE | Admit: 2013-11-01 | Discharge: 2013-11-01 | Disposition: A | Discharge status: Home / Self Care | Payer: Medicare Other | Source: Ambulatory Visit | Attending: Urology | Admitting: Urology

## 2013-11-01 DIAGNOSIS — C649 Malignant neoplasm of unspecified kidney, except renal pelvis: Secondary | ICD-10-CM

## 2013-11-01 DIAGNOSIS — Z09 Encounter for follow-up examination after completed treatment for conditions other than malignant neoplasm: Secondary | ICD-10-CM | POA: Diagnosis not present

## 2013-11-01 LAB — POCT I-STAT CREATININE: CREATININE: 1 mg/dL (ref 0.50–1.10)

## 2013-11-01 MED ORDER — IOHEXOL 300 MG/ML  SOLN
100.0000 mL | Freq: Once | INTRAMUSCULAR | Status: AC | PRN
  Administered 2013-11-01: 100 mL via INTRAVENOUS

## 2013-11-19 DIAGNOSIS — M25819 Other specified joint disorders, unspecified shoulder: Secondary | ICD-10-CM | POA: Diagnosis not present

## 2013-11-19 DIAGNOSIS — G8918 Other acute postprocedural pain: Secondary | ICD-10-CM | POA: Diagnosis not present

## 2013-11-19 DIAGNOSIS — M24119 Other articular cartilage disorders, unspecified shoulder: Secondary | ICD-10-CM | POA: Diagnosis not present

## 2013-11-19 DIAGNOSIS — M75110 Incomplete rotator cuff tear or rupture of unspecified shoulder, not specified as traumatic: Secondary | ICD-10-CM | POA: Diagnosis not present

## 2013-11-19 DIAGNOSIS — M659 Synovitis and tenosynovitis, unspecified: Secondary | ICD-10-CM | POA: Diagnosis not present

## 2013-11-19 DIAGNOSIS — M9420 Chondromalacia, unspecified site: Secondary | ICD-10-CM | POA: Diagnosis not present

## 2013-11-22 ENCOUNTER — Ambulatory Visit (HOSPITAL_COMMUNITY)
Admission: RE | Admit: 2013-11-22 | Discharge: 2013-11-22 | Disposition: A | Discharge status: Home / Self Care | Payer: Medicare Other | Source: Ambulatory Visit | Attending: Orthopedic Surgery | Admitting: Orthopedic Surgery

## 2013-11-22 DIAGNOSIS — M25519 Pain in unspecified shoulder: Secondary | ICD-10-CM | POA: Diagnosis not present

## 2013-11-22 DIAGNOSIS — M25611 Stiffness of right shoulder, not elsewhere classified: Secondary | ICD-10-CM | POA: Insufficient documentation

## 2013-11-22 DIAGNOSIS — M6281 Muscle weakness (generalized): Secondary | ICD-10-CM | POA: Insufficient documentation

## 2013-11-22 DIAGNOSIS — IMO0001 Reserved for inherently not codable concepts without codable children: Secondary | ICD-10-CM | POA: Diagnosis not present

## 2013-11-22 DIAGNOSIS — M6289 Other specified disorders of muscle: Secondary | ICD-10-CM

## 2013-11-22 DIAGNOSIS — M25619 Stiffness of unspecified shoulder, not elsewhere classified: Secondary | ICD-10-CM | POA: Insufficient documentation

## 2013-11-22 NOTE — Evaluation (Signed)
Occupational Therapy Evaluation  Patient Details  Name: KAHNIYA OVERBAY MRN: [id number] Date of Birth: 07-01-1946  Today's Date: 11/22/2013 Time: 1010-1105 OT Time Calculation (min): 55 min Eval 1010-1050 (45') Self Care (HEP, sling) 1050-1105 (15')  Visit#: 1 of 36  Re-eval: 12/20/13  Assessment Diagnosis: Right Rotator Cuff Surgery Surgical Date: 11/19/13 Next MD Visit: March 12th Prior Therapy: for previous right rotator cuff surgery  Authorization: Medicare  Authorization Time Period: before 10th visit  Authorization Visit#: 1 of 10   Past Medical History:  Past Medical History  Diagnosis Date  . PONV (postoperative nausea and vomiting)   . Arthritis     chronic back pain  . Diarrhea     incontinent stools  . Nocturia   . Urinary frequency   . Anemia   . Depression   . Psychotic affective disorder     "psychotic delusions" "I cut myself"   . Restless leg syndrome   . Rheumatic fever   . MVP (mitral valve prolapse)   . Bipolar disorder   . Osteoporosis   . Osteoarthritis   . Diverticulitis     treated at least 5 times in past, hospitalized twice  . Cancer Jan 2013    kidney cancer, left, s/p partial nephrectomy  . Heart murmur   . GERD (gastroesophageal reflux disease)     esophageal spasms - takes nitroglycerin for this  . Anxiety   . Neuropathy    Past Surgical History:  Past Surgical History  Procedure Laterality Date  . Tonsillectomy    . Abdominal hysterectomy    . Cholecystectomy    . Carpell tunnell      rt wrist  x2  . Nissen fundoplication    . Rotator cuff repair    . Robot assisted laparoscopic nephrectomy  08/23/2011    Procedure: ROBOTIC ASSISTED LAPAROSCOPIC NEPHRECTOMY;  Surgeon: Dutch Gray, MD;  Location: [facility];  Service: Urology;  Laterality: Left;  Left Robotic Assisted  Laparoscopic Partial Nephrectomy   . Esophagogastroduodenoscopy  04/06/2010    intact Nissen fundoplication S/P dilation, somewhat baggy atonic appearing esophagus,  58-F dilation  . Esophagogastroduodenoscopy  1/11    nomral esophagus s/p 54-F Maloney dilation, normal/intact Nissen fundoplication, diffuse patchy erythema and erosions likely NSAID/ASA effect with benign biopsy  . Colonoscopy  5/02    pancolonic deverticula, internal hemorrhoids  . Colonoscopy  1/11    single external hemorrhoidal tag and anal papilla otherwise normal rectum, pancolonic diverticula, s/p sigmoid biopsy and stool sampling all unremarkable  . Back surgery    . Kidney surgery for cancer      Subjective Symptoms/Limitations Symptoms: "Everything is hard right now. I'm eating with my left hand." Pertinent History: Pt is presenting s/p right rotator cuff surgery on Monday, 11/19/13.  She has had increased pain in her right shoulder for years, and previously had surgery for a tear and removal of bone spurs/end of collar bone in 2012. She attended two therpay sessions after and experienced some improvement in her right arm. Approximately one year ago her pain began increasing again and approximately 6 months ago the pain became unbearable. A CAT scan revelaed another tear in her right rotator cuff, resultling in surgery this week to repair and removed additional bone fragments (per pt). Pt states she has experienced weakened muscles since she has not been using her arm due to the pain. Additionally, pt had back surgery approximatley 1 year ago to fuse the lower portion of her back, including placing 2  plates and 8 screws. Limitations: [facility] Protocol: (1) Immediate pendulum exercises (2) immediate elbow, wrist, hand AROM (3) PROM to the affected shoulder for the first 4 weeks (12/17/13) from surgery (4) Goasl: near full PROM by 3-4 weeks post-op (5) At 4 weeks post-op (12/17/13) intiatie AAROM and scapular stabilizers  (6) At 6 weeks post op (12/31/13) initiate shoulder AROM. Gradually advance to progressive resistive exercises. Special Tests: FOTO 39/100 (61% impaired) Patient  Stated Goals: "to get back use of my arm" Pain Assessment Currently in Pain?: Yes Pain Score: 8  Pain Location: Shoulder Pain Orientation: Right Pain Type: Acute pain;Surgical pain Pain Radiating Towards: medial elbow region and cervical neck region Pain Relieving Factors: heat, respositioning  Precautions/Restrictions  Precautions Precautions: Shoulder Precaution Comments:  Orthopaedics protocol (scanned) Restrictions Weight Bearing Restrictions: No  Balance Screening Balance Screen Has the patient fallen in the past 6 months: No  Prior Lena expects to be discharged to:: Private residence Living Arrangements: Alone (Spends days caregiving for her mother) Prior Function Level of Independence: Independent with basic ADLs;Independent with homemaking with ambulation Driving: Yes (Not currenlty, due to surgery) Vocation: Other (comment) (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 Dominant Hand: Right (Pt has implant in right eye) Vision - History Baseline Vision: No visual deficits  Cognition/Observation Cognition Overall Cognitive Status: Within Functional Limits for tasks assessed Arousal/Alertness: Awake/alert Orientation Level: Oriented X4 Observation/Other Assessments Observations: Pt has small dressing on top of right shoulder. Under dressing appears to be one approximately 2-inch incision and 3 small incision sites covered by steri strips. One sterp strip has a  bandaid laterally across, placed by pt, to reinforce steri strip. Area around incision sides is slightly crusted.  Sensation/Coordination/Edema Sensation Light Touch: Appears Intact Coordination Gross Motor Movements are Fluid and Coordinated: Not tested Fine Motor Movements are Fluid and Coordinated: Yes Edema Edema: Slight edema in right upper extremity  Additional Assessments RUE Assessment RUE Assessment: Exceptions to [facility] RUE PROM (degrees) RUE Overall PROM Comments: Assessed in reclined sitting, with ER/IR adducted Right Shoulder Flexion: 8 Degrees Right Shoulder ABduction: 38 Degrees Right Shoulder Internal Rotation: 21 Degrees Right Shoulder External Rotation: 22 Degrees Right Elbow Flexion:  (Decreased ROM, with pain) Right Elbow Extension:  (WFL PROM) LUE Assessment LUE Assessment: Within Functional Limits Palpation Palpation: Pt has mod fascial restrictions in bicep, upper trap,and upper arm regions. Pt is tender in anterior shoulder region.      Modalities Modalities: Moist Heat Manual Therapy Manual Therapy: Myofascial release Myofascial Release: MFR to upper trap, upper arm, and anterior shoulder region to decrease pain and facilitation pain-free ROM Moist Heat Therapy Number Minutes Moist Heat: 10 Minutes Moist Heat Location: Shoulder Activities of Daily Living Activities of Daily Living: Educated pt on appripriate wearing of sling, with abductor pillow between self and sling  Occupational Therapy Assessment and Plan OT Assessment and Plan Clinical Impression Statement: A: Pt is presenting to outpatient OT with increased pain levels, decreased PROM, decreased strength, increased fascial restrictions, and decreased RUE functional use. Pt will benefit from skilled therapeutic intervention in order to improve on the following deficits: Decreased  range of motion;Decreased strength;Increased fascial restricitons;Impaired UE functional use;Pain Rehab Potential:  Good OT Frequency: Min 3X/week OT Duration: 12 weeks OT Treatment/Interventions: Self-care/ADL training;Therapeutic exercise;Energy conservation;DME and/or AE instruction;Manual therapy;Modalities;Therapeutic activities;Patient/family education OT Plan: Pt will benefit from skilled OT treatment to decrease pain, increase PROM and AROM, decrease fascial restrictions, improve strength, and improved overall right upper extremity functional use. Treatment Plan: PROM, AAROM, AROM, MFR, scar release, proximal stability, scapular strengthening, progress with protocol as tolerated   Goals Home Exercise Program Pt/caregiver will Perform Home Exercise Program: For increased ROM;For increased strengthening PT Goal: Perform Home Exercise Program - Progress: Goal set today Short Term Goals Time to Complete Short Term Goals: 6 weeks Short Term Goal 1: Pt will be educated on HEP Short Term Goal 2: Pt will achieved right shoulder PROM WFL to facilitate reaching into a washing machine Short Term Goal 3: Pt will have pain levels of <5/10. Short Term Goal 4: Pt will have min fascial restrictions in her right bicep, upper arm, and upper trap regions. Long Term Goals Long Term Goal 1: Pt will ahcieve highest level of functioning in all ADLs, IADLs, work, and leisure activities. Long Term Goal 2: Pt will achieve right shoulder AROM to [facility] in order to hang laundry to dry. Long Term Goal 3: Pt will have pain of less than 2/10. Long Term Goal 4: Pt will have trace fascial restrictions. Long Term Goal 5: Pt will have ability to draw and administer her mother's insulin shot with no complaints of pain or difficulty, 5/5 times.  Problem List Patient Active Problem List   Diagnosis Date Noted  . Pain in joint, shoulder region 11/22/2013  . Decreased range of motion of right shoulder 11/22/2013  . Muscle tightness 11/22/2013  . Muscle weakness (generalized) 11/22/2013  . Low back pain 04/28/2011  . Post laminectomy  syndrome 04/28/2011  . DYSPHAGIA 09/26/2009  . DIARRHEA 09/26/2009  . FULL INCONTINENCE OF FECES 09/26/2009    End of Session Activity Tolerance: Patient tolerated treatment well General Behavior During Therapy: [facility] for tasks assessed/performed OT Plan of Care OT Home Exercise Plan: Pendulum exercises, hand/elbow AROM OT Patient Instructions: Pt verbalized good understanding - handout provided (scanned) Consulted and Agree with Plan of Care: Patient  GO Functional Assessment Tool Used: FOTO (39/100) Functional Limitation: Carrying, moving and handling objects Carrying, Moving and Handling Objects Current Status (U4403): At least 60 percent but less than 80 percent impaired, limited or restricted Carrying, Moving and Handling Objects Goal Status [phone]): At least 20 percent but less than 40 percent impaired, limited or restricted  Bea Graff, [place], OTR/L [phone]  11/22/2013, 12:47 PM  Physician Documentation Your signature is required to indicate approval of the treatment plan as stated above.  Please sign and either send electronically or make a copy of this report for your files and return this physician signed original.  Please mark one 1.__approve of plan  2. ___approve of plan with the following conditions.   ______________________________                                                          _____________________ Physician Signature  Date  

## 2013-11-26 ENCOUNTER — Ambulatory Visit (HOSPITAL_COMMUNITY)
Admission: RE | Admit: 2013-11-26 | Discharge: 2013-11-26 | Disposition: A | Discharge status: Home / Self Care | Payer: Medicare Other | Source: Ambulatory Visit | Attending: Orthopedic Surgery | Admitting: Orthopedic Surgery

## 2013-11-26 DIAGNOSIS — M25619 Stiffness of unspecified shoulder, not elsewhere classified: Secondary | ICD-10-CM | POA: Diagnosis not present

## 2013-11-26 DIAGNOSIS — M6281 Muscle weakness (generalized): Secondary | ICD-10-CM | POA: Diagnosis not present

## 2013-11-26 DIAGNOSIS — M25519 Pain in unspecified shoulder: Secondary | ICD-10-CM

## 2013-11-26 DIAGNOSIS — M6289 Other specified disorders of muscle: Secondary | ICD-10-CM

## 2013-11-26 DIAGNOSIS — IMO0001 Reserved for inherently not codable concepts without codable children: Secondary | ICD-10-CM | POA: Diagnosis not present

## 2013-11-26 DIAGNOSIS — M25611 Stiffness of right shoulder, not elsewhere classified: Secondary | ICD-10-CM

## 2013-11-26 NOTE — Progress Notes (Signed)
Occupational Therapy Treatment Patient Details  Name: DANELIA SNODGRASS MRN: [id number] Date of Birth: 1945/10/07  Today's Date: 11/26/2013 Time: 0350-0938 OT Time Calculation (min): 43 min Manual therapy [phone] 23' Therapeutic exercises [phone] Visit#: 2 of 36  Re-eval: 12/20/13    Authorization: Medicare  Authorization Time Period: before 10th visit  Authorization Visit#: 2 of 10  Subjective  S:  I had alot of pain last nigh.  I barely slept. Limitations: [facility] Protocol: (1) Immediate pendulum exercises (2) immediate elbow, wrist, hand AROM (3) PROM to the affected shoulder for the first 4 weeks (12/17/13) from surgery (4) Goasl: near full PROM by 3-4 weeks post-op (5) At 4 weeks post-op (12/17/13) intiatie AAROM and scapular stabilizers  (6) At 6 weeks post op (12/31/13) initiate shoulder AROM. Gradually advance to progressive resistive exercises. Pain Assessment Currently in Pain?: Yes Pain Score: 7  Pain Location: Shoulder Pain Orientation: Right Pain Type: Acute pain  Precautions/Restrictions    [facility] Protocol: (1) Immediate pendulum exercises (2) immediate elbow, wrist, hand AROM (3) PROM to the affected shoulder for the first 4 weeks (12/17/13) from surgery (4) Goasl: near full PROM by 3-4 weeks post-op (5) At 4 weeks post-op (12/17/13) intiatie AAROM and scapular stabilizers  (6) At 6 weeks post op (12/31/13) initiate shoulder AROM. Gradually advance to progressive resistive exercises.   Exercise/Treatments Supine Protraction: PROM;10 reps External Rotation: PROM;10 reps Internal Rotation: PROM;10 reps Flexion: PROM;10 reps ABduction: PROM;10 reps Other Supine Exercises: 15 times Seated Elevation: AROM;10 reps Extension: AROM;10 reps Row: AROM;10 reps Standing Other Standing Exercises: pendulums 10 times vertical and 10 times circular Isometric Strengthening   Flexion: Supine;3X3" Extension: Supine;3X3" External Rotation: Supine;3X3" Internal  Rotation: Supine;3X3" ABduction: Supine;3X3" ADduction: Supine;3X3" Stretches Table Stretch - Flexion: 60 seconds Table Stretch - Abduction: 60 seconds Table Stretch - External Rotation: 60 seconds    Manual Therapy Manual Therapy: Myofascial release Myofascial Release: MFR and manual stretching to right upper arm, scapular region, shoulder region to decrease pain and fascial restrictions and improve pain free mobility.   Occupational Therapy Assessment and Plan OT Assessment and Plan Clinical Impression Statement: A:  Patient able to tolerate PROM to 90 degrees of flexion and abduction this date. Reviewed pendulums and issued table slides for HEP. OT Plan: P:  Follow up on HEP.  Add ball stretches and elbow-hand AROM to therapeutic exercises.  Assess grip strength and add grip strength goal to treatment plan if appropriate.    Goals Home Exercise Program Pt/caregiver will Perform Home Exercise Program: For increased ROM;For increased strengthening Short Term Goals Time to Complete Short Term Goals: 6 weeks Short Term Goal 1: Pt will be educated on HEP Short Term Goal 1 Progress: Progressing toward goal Short Term Goal 2: Pt will achieved right shoulder PROM WFL to facilitate reaching into a washing machine Short Term Goal 2 Progress: Progressing toward goal Short Term Goal 3: Pt will have pain levels of <5/10. Short Term Goal 3 Progress: Progressing toward goal Short Term Goal 4: Pt will have min fascial restrictions in her right bicep, upper arm, and upper trap regions. Short Term Goal 4 Progress: Progressing toward goal Long Term Goals Long Term Goal 1: Pt will ahcieve highest level of functioning in all ADLs, IADLs, work, and leisure activities. Long Term Goal 1 Progress: Progressing toward goal Long Term Goal 2: Pt will achieve right shoulder AROM to [facility] in order to hang laundry to dry. Long Term Goal 2 Progress: Progressing toward goal Long Term  Goal 3: Pt will have pain of  less than 2/10. Long Term Goal 3 Progress: Progressing toward goal Long Term Goal 4: Pt will have trace fascial restrictions. Long Term Goal 4 Progress: Progressing toward goal Long Term Goal 5: Pt will have ability to draw and administer her mother's insulin shot with no complaints of pain or difficulty, 5/5 times. Long Term Goal 5 Progress: Progressing toward goal  Problem List Patient Active Problem List   Diagnosis Date Noted  . Pain in joint, shoulder region 11/22/2013  . Decreased range of motion of right shoulder 11/22/2013  . Muscle tightness 11/22/2013  . Muscle weakness (generalized) 11/22/2013  . Low back pain 04/28/2011  . Post laminectomy syndrome 04/28/2011  . DYSPHAGIA 09/26/2009  . DIARRHEA 09/26/2009  . FULL INCONTINENCE OF FECES 09/26/2009    End of Session Activity Tolerance: Patient tolerated treatment well General Behavior During Therapy: [facility] for tasks assessed/performed OT Plan of Care OT Home Exercise Plan: towel slides OT Patient Instructions: hand out and completed in clinic Consulted and Agree with Plan of Care: Patient  GO    Vangie Bicker, OTR/L  11/26/2013, 10:22 AM

## 2013-11-28 ENCOUNTER — Ambulatory Visit (HOSPITAL_COMMUNITY)
Admission: RE | Admit: 2013-11-28 | Discharge: 2013-11-28 | Disposition: A | Discharge status: Home / Self Care | Payer: Medicare Other | Source: Ambulatory Visit | Attending: Orthopedic Surgery | Admitting: Orthopedic Surgery

## 2013-11-28 DIAGNOSIS — M25619 Stiffness of unspecified shoulder, not elsewhere classified: Secondary | ICD-10-CM | POA: Diagnosis not present

## 2013-11-28 DIAGNOSIS — M25611 Stiffness of right shoulder, not elsewhere classified: Secondary | ICD-10-CM

## 2013-11-28 DIAGNOSIS — IMO0001 Reserved for inherently not codable concepts without codable children: Secondary | ICD-10-CM | POA: Diagnosis not present

## 2013-11-28 DIAGNOSIS — M25519 Pain in unspecified shoulder: Secondary | ICD-10-CM | POA: Diagnosis not present

## 2013-11-28 DIAGNOSIS — M6281 Muscle weakness (generalized): Secondary | ICD-10-CM | POA: Diagnosis not present

## 2013-11-28 DIAGNOSIS — M6289 Other specified disorders of muscle: Secondary | ICD-10-CM

## 2013-11-28 NOTE — Progress Notes (Signed)
Occupational Therapy Treatment Patient Details  Name: ALANDA COLTON MRN: [id number] Date of Birth: 05/01/1946  Today's Date: 11/28/2013 Time: 5400-8676 OT Time Calculation (min): 43 min MFR [phone]' Therex 712-4580 20'  Visit#: 3 of 36  Re-eval: 12/20/13    Authorization: Medicare  Authorization Time Period: before 10th visit  Authorization Visit#: 3 of 10  Subjective Symptoms/Limitations Symptoms: S: I was really sore from last session. Today is a bad day too.  Pain Assessment Currently in Pain?: Yes Pain Score: 6  Pain Location: Shoulder Pain Orientation: Right Pain Type: Acute pain;Surgical pain  Precautions/Restrictions  Precautions Precautions: Shoulder Precaution Comments: [facility] protocol (scanned)  Exercise/Treatments Supine Protraction: PROM;10 reps Horizontal ABduction: PROM;10 reps External Rotation: PROM;10 reps Internal Rotation: PROM;10 reps Flexion: PROM;10 reps ABduction: PROM;10 reps Seated Elevation: AROM;10 reps Extension: AROM;10 reps Row: AROM;10 reps Therapy Ball Flexion: 10 reps ABduction: 10 reps ROM / Strengthening / Isometric Strengthening   Flexion: Supine;3X3" Extension: Supine;3X3" External Rotation: Supine;3X3" Internal Rotation: Supine;3X3" ABduction: Supine;3X3" ADduction: Supine;3X3"     Manual Therapy Manual Therapy: Myofascial release Myofascial Release: MFR and manual stretching to right upper arm, scapular region, shoulder region to decrease pain and fascial restrictions and improve pain free mobility.   Occupational Therapy Assessment and Plan OT Assessment and Plan Clinical Impression Statement: A: Grip strength assessed: Rt: 40#, Lt: 55#. Added LTG for strength. Patient very sore this date. Education provided for pain management. Patient able to tolerate passive stretching to approximately 90 degrees or less in all ranges.  OT Plan: P: Add elbow-hand AROM exercises. Add hand strengthening  exercises.   Goals Home Exercise Program Pt/caregiver will Perform Home Exercise Program: For increased ROM;For increased strengthening Short Term Goals Time to Complete Short Term Goals: 6 weeks Short Term Goal 1: Pt will be educated on HEP Short Term Goal 1 Progress: Progressing toward goal Short Term Goal 2: Pt will achieved right shoulder PROM WFL to facilitate reaching into a washing machine Short Term Goal 2 Progress: Progressing toward goal Short Term Goal 3: Pt will have pain levels of <5/10. Short Term Goal 3 Progress: Progressing toward goal Short Term Goal 4: Pt will have min fascial restrictions in her right bicep, upper arm, and upper trap regions. Short Term Goal 4 Progress: Progressing toward goal Long Term Goals Long Term Goal 1: Pt will ahcieve highest level of functioning in all ADLs, IADLs, work, and leisure activities. Long Term Goal 1 Progress: Progressing toward goal Long Term Goal 2: Pt will achieve right shoulder AROM to [facility] in order to hang laundry to dry. Long Term Goal 2 Progress: Progressing toward goal Long Term Goal 3: Pt will have pain of less than 2/10. Long Term Goal 3 Progress: Progressing toward goal Long Term Goal 4: Pt will have trace fascial restrictions. Long Term Goal 4 Progress: Progressing toward goal Long Term Goal 5: Pt will have ability to draw and administer her mother's insulin shot with no complaints of pain or difficulty, 5/5 times. Long Term Goal 5 Progress: Progressing toward goal Additional Long Term Goals?: Yes Long Term Goal 6: Patient will increase Rt hand strength to 50# to increase the ability to open jars and containers.  Long Term Goal 6 Progress: Progressing toward goal (NEW)  Problem List Patient Active Problem List   Diagnosis Date Noted  . Pain in joint, shoulder region 11/22/2013  . Decreased range of motion of right shoulder 11/22/2013  . Muscle tightness 11/22/2013  . Muscle weakness (generalized) 11/22/2013  .  Low  back pain 04/28/2011  . Post laminectomy syndrome 04/28/2011  . DYSPHAGIA 09/26/2009  . DIARRHEA 09/26/2009  . FULL INCONTINENCE OF FECES 09/26/2009    End of Session Activity Tolerance: Patient tolerated treatment well General Behavior During Therapy: [facility] for tasks assessed/performed OT Plan of Care OT Home Exercise Plan: towel slides OT Patient Instructions: hand out and completed in clinic Consulted and Agree with Plan of Care: Patient  Ailene Ravel, OTR/L,CBIS   11/28/2013, 12:07 PM

## 2013-11-30 ENCOUNTER — Ambulatory Visit (HOSPITAL_COMMUNITY)
Admission: RE | Admit: 2013-11-30 | Discharge: 2013-11-30 | Disposition: A | Discharge status: Home / Self Care | Payer: Medicare Other | Source: Ambulatory Visit | Attending: Family Medicine | Admitting: Family Medicine

## 2013-11-30 DIAGNOSIS — M25611 Stiffness of right shoulder, not elsewhere classified: Secondary | ICD-10-CM

## 2013-11-30 DIAGNOSIS — M6281 Muscle weakness (generalized): Secondary | ICD-10-CM | POA: Diagnosis not present

## 2013-11-30 DIAGNOSIS — M25619 Stiffness of unspecified shoulder, not elsewhere classified: Secondary | ICD-10-CM | POA: Diagnosis not present

## 2013-11-30 DIAGNOSIS — IMO0001 Reserved for inherently not codable concepts without codable children: Secondary | ICD-10-CM | POA: Diagnosis not present

## 2013-11-30 DIAGNOSIS — M25519 Pain in unspecified shoulder: Secondary | ICD-10-CM

## 2013-11-30 DIAGNOSIS — M6289 Other specified disorders of muscle: Secondary | ICD-10-CM

## 2013-11-30 NOTE — Progress Notes (Signed)
Occupational Therapy Treatment Patient Details  Name: Victoria Mccarthy MRN: [id number] Date of Birth: January 08, 1946  Today's Date: 11/30/2013 Time: 0539-7673 OT Time Calculation (min): 41 min MFR [phone] 16' Therex 905-930 25'  Visit#: 4 of 36  Re-eval: 12/20/13    Authorization: Medicare  Authorization Time Period: before 10th visit  Authorization Visit#: 4 of 10  Subjective Symptoms/Limitations Symptoms: S: I went to see the Dr yesterday for my shoulder. He was very pleased with the work you all are doing here. He just said to go slow. Pain Assessment Currently in Pain?: Yes Pain Score: 5  Pain Location: Shoulder Pain Orientation: Right Pain Type: Acute pain;Surgical pain  Precautions/Restrictions  Precautions Precautions: Shoulder Precaution Comments: [facility] protocol (scanned)  Exercise/Treatments Supine Protraction: PROM;10 reps Horizontal ABduction: PROM;10 reps External Rotation: PROM;10 reps Internal Rotation: PROM;10 reps Flexion: PROM;10 reps ABduction:  (Not completed per MD orders) Seated Elevation: AROM;15 reps Extension: AROM;15 reps Row: AROM;15 reps Therapy Ball Flexion: 15 reps ABduction: 15 reps ROM / Strengthening / Isometric Strengthening   Flexion: Supine;3X3" Extension: Supine;3X3" External Rotation: Supine;3X3" Internal Rotation: Supine;3X3" ABduction: Supine;3X3" ADduction: Supine;3X3"  Hand Exercises Theraputty - Grip: red Theraputty - Pinch: red     Manual Therapy Manual Therapy: Myofascial release Myofascial Release: MFR and manual stretching to right upper arm, scapular region, shoulder region to decrease pain and fascial restrictions and improve pain free mobility.  Occupational Therapy Assessment and Plan OT Assessment and Plan Clinical Impression Statement: A: Patient brought signed OT order with her from [facility]. Order states: No abduction, Go slow, gentle isometrics. Added theraputty gripping  exercise this date. Patient tolerated well with cues to relax shoulder.  OT Plan: P:  Add elbow-hand AROM to therapeutic exercises.  Increase isometrics to 5x5   Goals Home Exercise Program Pt/caregiver will Perform Home Exercise Program: For increased ROM;For increased strengthening Short Term Goals Time to Complete Short Term Goals: 6 weeks Short Term Goal 1: Pt will be educated on HEP Short Term Goal 2: Pt will achieved right shoulder PROM WFL to facilitate reaching into a washing machine Short Term Goal 3: Pt will have pain levels of <5/10. Short Term Goal 4: Pt will have min fascial restrictions in her right bicep, upper arm, and upper trap regions. Long Term Goals Long Term Goal 1: Pt will ahcieve highest level of functioning in all ADLs, IADLs, work, and leisure activities. Long Term Goal 2: Pt will achieve right shoulder AROM to [facility] in order to hang laundry to dry. Long Term Goal 3: Pt will have pain of less than 2/10. Long Term Goal 4: Pt will have trace fascial restrictions. Long Term Goal 5: Pt will have ability to draw and administer her mother's insulin shot with no complaints of pain or difficulty, 5/5 times. Additional Long Term Goals?: Yes Long Term Goal 6: Patient will increase Rt hand strength to 50# to increase the ability to open jars and containers.   Problem List Patient Active Problem List   Diagnosis Date Noted  . Pain in joint, shoulder region 11/22/2013  . Decreased range of motion of right shoulder 11/22/2013  . Muscle tightness 11/22/2013  . Muscle weakness (generalized) 11/22/2013  . Low back pain 04/28/2011  . Post laminectomy syndrome 04/28/2011  . DYSPHAGIA 09/26/2009  . DIARRHEA 09/26/2009  . FULL INCONTINENCE OF FECES 09/26/2009    End of Session Activity Tolerance: Patient tolerated treatment well General Behavior During Therapy: [facility] for tasks assessed/performed  Ailene Ravel, OTR/L,CBIS   11/30/2013,  9:32 AM

## 2013-12-03 ENCOUNTER — Ambulatory Visit (HOSPITAL_COMMUNITY)
Admission: RE | Admit: 2013-12-03 | Discharge: 2013-12-03 | Disposition: A | Discharge status: Home / Self Care | Payer: Medicare Other | Source: Ambulatory Visit | Attending: Family Medicine | Admitting: Family Medicine

## 2013-12-03 DIAGNOSIS — M6281 Muscle weakness (generalized): Secondary | ICD-10-CM | POA: Diagnosis not present

## 2013-12-03 DIAGNOSIS — M25619 Stiffness of unspecified shoulder, not elsewhere classified: Secondary | ICD-10-CM | POA: Diagnosis not present

## 2013-12-03 DIAGNOSIS — M25519 Pain in unspecified shoulder: Secondary | ICD-10-CM | POA: Diagnosis not present

## 2013-12-03 DIAGNOSIS — IMO0001 Reserved for inherently not codable concepts without codable children: Secondary | ICD-10-CM | POA: Diagnosis not present

## 2013-12-03 NOTE — Progress Notes (Signed)
Occupational Therapy Treatment Patient Details  Name: Victoria Mccarthy MRN: [id number] Date of Birth: [birth year]  Today's Date: 12/03/2013 Time: 3474-2595 OT Time Calculation (min): 38 min Manual 1110-1125 (15') Therapeutic Exercises 1125-1148 (23')  Visit#: 5 of 36  Re-eval: 12/20/13    Authorization: Medicare  Authorization Time Period: before 10th visit  Authorization Visit#: 5 of 10  Subjective Symptoms/Limitations Symptoms: S: "I'm doing much better today. Its just not hurting as much. I dont know what's different. And the doctor said I should only come twice a week." Limitations: [facility] Protocol: (1) Immediate pendulum exercises (2) immediate elbow, wrist, hand AROM (3) PROM to the affected shoulder for the first 4 weeks (12/17/13) from surgery (4) Goasl: near full PROM by 3-4 weeks post-op (5) At 4 weeks post-op (12/17/13) intiatie AAROM and scapular stabilizers  (6) At 6 weeks post op (12/31/13) initiate shoulder AROM. Gradually advance to progressive resistive exercises. Pain Assessment Currently in Pain?: Yes Pain Score: 2  Pain Location: Shoulder Pain Orientation: Right Pain Type: Acute pain;Surgical pain   Exercise/Treatments Supine Protraction: PROM;10 reps Horizontal ABduction: PROM;10 reps External Rotation: PROM;10 reps Internal Rotation: PROM;10 reps Flexion: PROM;10 reps ABduction:  (No completed per MD orders) Other Supine Exercises:  (bridges not completed per pt request for back pain) Seated Elevation: AROM;15 reps Extension: AROM;15 reps Row: AROM;15 reps Therapy Ball Flexion: 15 reps ROM / Strengthening / Isometric Strengthening   Flexion: Supine;5X5" Extension: Supine;5X5" External Rotation: Supine;5X5" Internal Rotation: Supine;5X5" ABduction: Supine;5X5" ADduction: 5X5";Supine Elbow Exercises Elbow Flexion: AROM;10 reps Elbow Extension: AROM;10 reps Forearm Supination: AROM;10 reps Forearm Pronation: AROM;10 reps Wrist Flexion:  AROM;10 reps Wrist Extension: AROM;10 reps      Manual Therapy Manual Therapy: Myofascial release Myofascial Release: MFR and manual stretching to right upper arm, scapular region, shoulder region to decrease pain and fascial restrictions and improve pain free mobility.    Occupational Therapy Assessment and Plan OT Assessment and Plan Clinical Impression Statement: A: Increased isometrics this date- pt tolerated well but with some increased pain. Pt had continued good tolerance of PROM within current range. did not complete PROM abudction or ball abduction per MD order.  Pt states she is doing her exercies at home with no questions, including hand/elbow AROM. Pt had slight pain with elbow flexion/extension AROM OT Plan: P: Follow up on increased isometrics. Continue theraputty for gripping.   Goals Short Term Goals Short Term Goal 1: Pt will be educated on HEP Short Term Goal 1 Progress: Progressing toward goal Short Term Goal 2: Pt will achieved right shoulder PROM WFL to facilitate reaching into a washing machine Short Term Goal 2 Progress: Progressing toward goal Short Term Goal 3: Pt will have pain levels of <5/10. Short Term Goal 3 Progress: Progressing toward goal Short Term Goal 4: Pt will have min fascial restrictions in her right bicep, upper arm, and upper trap regions. Short Term Goal 4 Progress: Progressing toward goal Long Term Goals Long Term Goal 1: Pt will ahcieve highest level of functioning in all ADLs, IADLs, work, and leisure activities. Long Term Goal 1 Progress: Progressing toward goal Long Term Goal 2: Pt will achieve right shoulder AROM to [facility] in order to hang laundry to dry. Long Term Goal 2 Progress: Progressing toward goal Long Term Goal 3: Pt will have pain of less than 2/10. Long Term Goal 3 Progress: Progressing toward goal Long Term Goal 4: Pt will have trace fascial restrictions. Long Term Goal 4 Progress: Progressing toward goal Long Term  Goal 5: Pt  will have ability to draw and administer her mother's insulin shot with no complaints of pain or difficulty, 5/5 times. Long Term Goal 5 Progress: Progressing toward goal Long Term Goal 6: Patient will increase Rt hand strength to 50# to increase the ability to open jars and containers.  Long Term Goal 6 Progress: Progressing toward goal  Problem List Patient Active Problem List   Diagnosis Date Noted  . Pain in joint, shoulder region 11/22/2013  . Decreased range of motion of right shoulder 11/22/2013  . Muscle tightness 11/22/2013  . Muscle weakness (generalized) 11/22/2013  . Low back pain 04/28/2011  . Post laminectomy syndrome 04/28/2011  . DYSPHAGIA 09/26/2009  . DIARRHEA 09/26/2009  . FULL INCONTINENCE OF FECES 09/26/2009    End of Session Activity Tolerance: Patient tolerated treatment well General Behavior During Therapy: [facility] for tasks assessed/performed  GO    Bea Graff, MS, OTR/L [phone]  12/03/2013, 11:54 AM

## 2013-12-05 ENCOUNTER — Ambulatory Visit (HOSPITAL_COMMUNITY): Payer: Medicare Other | Admitting: Specialist

## 2013-12-06 ENCOUNTER — Ambulatory Visit (HOSPITAL_COMMUNITY)
Admission: RE | Admit: 2013-12-06 | Discharge: 2013-12-06 | Disposition: A | Discharge status: Home / Self Care | Payer: Medicare Other | Source: Ambulatory Visit | Attending: Family Medicine | Admitting: Family Medicine

## 2013-12-06 DIAGNOSIS — M25619 Stiffness of unspecified shoulder, not elsewhere classified: Secondary | ICD-10-CM | POA: Diagnosis not present

## 2013-12-06 DIAGNOSIS — M25519 Pain in unspecified shoulder: Secondary | ICD-10-CM | POA: Diagnosis not present

## 2013-12-06 DIAGNOSIS — IMO0001 Reserved for inherently not codable concepts without codable children: Secondary | ICD-10-CM | POA: Diagnosis not present

## 2013-12-06 DIAGNOSIS — M6281 Muscle weakness (generalized): Secondary | ICD-10-CM | POA: Diagnosis not present

## 2013-12-06 NOTE — Progress Notes (Signed)
Occupational Therapy Treatment Patient Details  Name: Victoria Mccarthy MRN: [id number] Date of Birth: [birth year]  Today's Date: 12/06/2013 Time: 1520-1600 OT Time Calculation (min): 40 min Manual 1520-1545 (25') Therapeutic Exercises 1545-1600 (15')  Visit#: 6 of 36  Re-eval: 12/20/13    Authorization: Medicare  Authorization Time Period: before 10th visit  Authorization Visit#: 6 of 10  Subjective Symptoms/Limitations Symptoms: "Not unless i move it (pain). I'm doing much better i think. i haven't had to take any of the pain medication." Limitations: [facility] Protocol: (1) Immediate pendulum exercises (2) immediate elbow, wrist, hand AROM (3) PROM to the affected shoulder for the first 4 weeks (12/17/13) from surgery (4) Goasl: near full PROM by 3-4 weeks post-op (5) At 4 weeks post-op (12/17/13) intiatie AAROM and scapular stabilizers  (6) At 6 weeks post op (12/31/13) initiate shoulder AROM. Gradually advance to progressive resistive exercises. Pain Assessment Currently in Pain?: No/denies  Precautions/Restrictions     Exercise/Treatments Supine Protraction: PROM;10 reps Horizontal ABduction: PROM;10 reps External Rotation: PROM;10 reps Internal Rotation: PROM;10 reps Flexion: PROM;10 reps ABduction: PROM;10 reps Seated Elevation: AROM;20 reps Extension: AROM;20 reps Row: AROM;20 reps Therapy Ball Flexion: 20 reps ROM / Strengthening / Isometric Strengthening   Flexion: Supine;5X5" Extension: Supine;5X5" External Rotation: Supine;5X5" Internal Rotation: Supine;5X5" ABduction: Supine;5X5" ADduction: Supine;5X5"    Manual Therapy Manual Therapy: Myofascial release Myofascial Release: MFR and manual stretching to right upper arm, scapular region, shoulder region to decrease pain and fascial restrictions and improve pain free mobility  Occupational Therapy Assessment and Plan OT Assessment and Plan Clinical Impression Statement: Pt tolerated well all PROM.   She is still having pain with ER isometrics, but had decreased pain after lessning the pressure she was applying. OT Plan: continue PROM per protocol. Increase isometrics as tolerated. No abudction per MD order.   Goals Short Term Goals Short Term Goal 1: Pt will be educated on HEP Short Term Goal 1 Progress: Progressing toward goal Short Term Goal 2: Pt will achieved right shoulder PROM WFL to facilitate reaching into a washing machine Short Term Goal 2 Progress: Progressing toward goal Short Term Goal 3: Pt will have pain levels of <5/10. Short Term Goal 3 Progress: Progressing toward goal Short Term Goal 4: Pt will have min fascial restrictions in her right bicep, upper arm, and upper trap regions. Short Term Goal 4 Progress: Progressing toward goal Long Term Goals Long Term Goal 1: Pt will ahcieve highest level of functioning in all ADLs, IADLs, work, and leisure activities. Long Term Goal 1 Progress: Progressing toward goal Long Term Goal 2: Pt will achieve right shoulder AROM to [facility] in order to hang laundry to dry. Long Term Goal 2 Progress: Progressing toward goal Long Term Goal 3: Pt will have pain of less than 2/10. Long Term Goal 3 Progress: Progressing toward goal Long Term Goal 4: Pt will have trace fascial restrictions. Long Term Goal 4 Progress: Progressing toward goal Long Term Goal 5: Pt will have ability to draw and administer her mother's insulin shot with no complaints of pain or difficulty, 5/5 times. Long Term Goal 5 Progress: Progressing toward goal Long Term Goal 6: Patient will increase Rt hand strength to 50# to increase the ability to open jars and containers.  Long Term Goal 6 Progress: Progressing toward goal  Problem List Patient Active Problem List   Diagnosis Date Noted  . Pain in joint, shoulder region 11/22/2013  . Decreased range of motion of right shoulder 11/22/2013  . Muscle  tightness 11/22/2013  . Muscle weakness (generalized) 11/22/2013  . Low  back pain 04/28/2011  . Post laminectomy syndrome 04/28/2011  . DYSPHAGIA 09/26/2009  . DIARRHEA 09/26/2009  . FULL INCONTINENCE OF FECES 09/26/2009    End of Session Activity Tolerance: Patient tolerated treatment well General Behavior During Therapy: [facility] for tasks assessed/performed  GO    Bea Graff, MS, OTR/L [phone]  12/06/2013, 4:10 PM

## 2013-12-07 ENCOUNTER — Ambulatory Visit (HOSPITAL_COMMUNITY): Payer: Medicare Other | Admitting: Specialist

## 2013-12-08 ENCOUNTER — Other Ambulatory Visit: Payer: Self-pay | Admitting: Urgent Care

## 2013-12-10 ENCOUNTER — Ambulatory Visit (HOSPITAL_COMMUNITY): Payer: Medicare Other

## 2013-12-10 ENCOUNTER — Telehealth: Payer: Self-pay | Admitting: Gastroenterology

## 2013-12-10 NOTE — Telephone Encounter (Signed)
I refilled [facility] X 1. Hasn't been seen in 2 years. Needs OV.

## 2013-12-11 ENCOUNTER — Ambulatory Visit (HOSPITAL_COMMUNITY)
Admission: RE | Admit: 2013-12-11 | Discharge: 2013-12-11 | Disposition: A | Discharge status: Home / Self Care | Payer: Medicare Other | Source: Ambulatory Visit | Attending: Orthopedic Surgery | Admitting: Orthopedic Surgery

## 2013-12-11 ENCOUNTER — Encounter: Payer: Self-pay | Admitting: Internal Medicine

## 2013-12-11 DIAGNOSIS — M6281 Muscle weakness (generalized): Secondary | ICD-10-CM | POA: Diagnosis not present

## 2013-12-11 DIAGNOSIS — M25619 Stiffness of unspecified shoulder, not elsewhere classified: Secondary | ICD-10-CM | POA: Diagnosis not present

## 2013-12-11 DIAGNOSIS — IMO0001 Reserved for inherently not codable concepts without codable children: Secondary | ICD-10-CM | POA: Diagnosis not present

## 2013-12-11 DIAGNOSIS — M25519 Pain in unspecified shoulder: Secondary | ICD-10-CM | POA: Diagnosis not present

## 2013-12-11 NOTE — Progress Notes (Signed)
Occupational Therapy Treatment Patient Details  Name: Victoria Mccarthy MRN: [id number] Date of Birth: 1946/05/06  Today's Date: 12/11/2013 Time: 2585-2778 OT Time Calculation (min): 42 min Manual [phone] (25') Therapeutic Exercises 1000-1017 (17')  Visit#: 7 of 36  Re-eval: 12/20/13    Authorization: Medicare  Authorization Time Period: before 10th visit  Authorization Visit#: 7 of 10  Subjective Symptoms/Limitations Symptoms: I think i may have gotten over zealous last time, I was really sore after last time, and I 'm still sore now." Limitations: [facility] Protocol: (1) Immediate pendulum exercises (2) immediate elbow, wrist, hand AROM (3) PROM to the affected shoulder for the first 4 weeks (12/17/13) from surgery (4) Goasl: near full PROM by 3-4 weeks post-op (5) At 4 weeks post-op (12/17/13) intiatie AAROM and scapular stabilizers  (6) At 6 weeks post op (12/31/13) initiate shoulder AROM. Gradually advance to progressive resistive exercises. Pain Assessment Currently in Pain?: Yes Pain Score: 3  Pain Location: Shoulder Pain Orientation: Right Pain Type: Acute pain  Precautions/Restrictions     Exercise/Treatments Supine Protraction: PROM;10 reps Horizontal ABduction: PROM;10 reps External Rotation: PROM;10 reps Internal Rotation: PROM;10 reps Flexion: PROM;10 reps ABduction:  (no competed per MD order) Seated Elevation: AROM;20 reps Extension: AROM;20 reps Row: AROM;20 reps Therapy Ball Flexion: 20 reps ROM / Strengthening / Isometric Strengthening   Flexion: 3X5" Extension: 3X5" External Rotation: 3X5" Internal Rotation: 3X5" ABduction: 3X5" ADduction: 3X5"    Manual Therapy Manual Therapy: Myofascial release Myofascial Release: MFR and manual stretching to right upper arm, scapular region, shoulder region to decrease pain and fascial restrictions and improve pain free mobility.    Occupational Therapy Assessment and Plan OT Assessment and  Plan Clinical Impression Statement: Pt had increased soreness after last session, which last through today. Focused session on MFR to decrease pain and tightness, particularly in bilateral upper trap regions. Did not further increase reps in exercises this session - pt had good tolerance and verbalized decrease pain at end of session OT Plan: Continue PROM per protocol. Increase isometrics as tolerated. No abudction per MD order.   Goals Short Term Goals Short Term Goal 1: Pt will be educated on HEP Short Term Goal 1 Progress: Progressing toward goal Short Term Goal 2: Pt will achieved right shoulder PROM WFL to facilitate reaching into a washing machine Short Term Goal 2 Progress: Progressing toward goal Short Term Goal 3: Pt will have pain levels of <5/10. Short Term Goal 3 Progress: Progressing toward goal Short Term Goal 4: Pt will have min fascial restrictions in her right bicep, upper arm, and upper trap regions. Short Term Goal 4 Progress: Progressing toward goal Long Term Goals Long Term Goal 1: Pt will ahcieve highest level of functioning in all ADLs, IADLs, work, and leisure activities. Long Term Goal 1 Progress: Progressing toward goal Long Term Goal 2: Pt will achieve right shoulder AROM to [facility] in order to hang laundry to dry. Long Term Goal 2 Progress: Progressing toward goal Long Term Goal 3: Pt will have pain of less than 2/10. Long Term Goal 3 Progress: Progressing toward goal Long Term Goal 4: Pt will have trace fascial restrictions. Long Term Goal 4 Progress: Progressing toward goal Long Term Goal 5: Pt will have ability to draw and administer her mother's insulin shot with no complaints of pain or difficulty, 5/5 times. Long Term Goal 5 Progress: Progressing toward goal Long Term Goal 6: Patient will increase Rt hand strength to 50# to increase the ability to open jars  and containers.  Long Term Goal 6 Progress: Progressing toward goal  Problem List Patient Active  Problem List   Diagnosis Date Noted  . Pain in joint, shoulder region 11/22/2013  . Decreased range of motion of right shoulder 11/22/2013  . Muscle tightness 11/22/2013  . Muscle weakness (generalized) 11/22/2013  . Low back pain 04/28/2011  . Post laminectomy syndrome 04/28/2011  . DYSPHAGIA 09/26/2009  . DIARRHEA 09/26/2009  . FULL INCONTINENCE OF FECES 09/26/2009    End of Session Activity Tolerance: Patient tolerated treatment well General Behavior During Therapy: [facility] for tasks assessed/performed  GO    Bea Graff, [place], OTR/L [phone]  12/11/2013, 12:07 PM

## 2013-12-11 NOTE — Telephone Encounter (Signed)
Mailed letter that patient needs OV to further refills and sent appt card for 4/21 at 130 with AS

## 2013-12-12 ENCOUNTER — Ambulatory Visit (HOSPITAL_COMMUNITY): Payer: Medicare Other

## 2013-12-14 ENCOUNTER — Ambulatory Visit (HOSPITAL_COMMUNITY): Payer: Medicare Other

## 2013-12-17 ENCOUNTER — Ambulatory Visit (HOSPITAL_COMMUNITY): Payer: Medicare Other

## 2013-12-18 ENCOUNTER — Inpatient Hospital Stay (HOSPITAL_COMMUNITY): Admission: RE | Admit: 2013-12-18 | Payer: Medicare Other | Source: Ambulatory Visit

## 2013-12-20 ENCOUNTER — Ambulatory Visit (HOSPITAL_COMMUNITY)
Admission: RE | Admit: 2013-12-20 | Discharge: 2013-12-20 | Disposition: A | Discharge status: Home / Self Care | Payer: Medicare Other | Source: Ambulatory Visit | Attending: Family Medicine | Admitting: Family Medicine

## 2013-12-20 DIAGNOSIS — IMO0001 Reserved for inherently not codable concepts without codable children: Secondary | ICD-10-CM | POA: Diagnosis not present

## 2013-12-20 DIAGNOSIS — M25611 Stiffness of right shoulder, not elsewhere classified: Secondary | ICD-10-CM

## 2013-12-20 DIAGNOSIS — M25519 Pain in unspecified shoulder: Secondary | ICD-10-CM | POA: Diagnosis not present

## 2013-12-20 DIAGNOSIS — M25619 Stiffness of unspecified shoulder, not elsewhere classified: Secondary | ICD-10-CM | POA: Diagnosis not present

## 2013-12-20 DIAGNOSIS — M6281 Muscle weakness (generalized): Secondary | ICD-10-CM | POA: Diagnosis not present

## 2013-12-20 DIAGNOSIS — M6289 Other specified disorders of muscle: Secondary | ICD-10-CM

## 2013-12-20 NOTE — Progress Notes (Signed)
Occupational Therapy Evaluation  Patient Details  Name: Victoria Mccarthy MRN: [id number] Date of Birth: February 29, 1946  Today's Date: 12/20/2013 Time: 6759-1638 OT Time Calculation (min): 42 min Manual therapy 4665-9935 17' Therapeutic exercises 1550-1600 10' ROM 7017-7939 15'  Visit#: 8 of 36  Re-eval: 01/17/14     Authorization: Medicare  Authorization Time Period: before 18th visit  Authorization Visit#: 8 of 18   Past Medical History:  Past Medical History  Diagnosis Date  . PONV (postoperative nausea and vomiting)   . Arthritis     chronic back pain  . Diarrhea     incontinent stools  . Nocturia   . Urinary frequency   . Anemia   . Depression   . Psychotic affective disorder     "psychotic delusions" "I cut myself"   . Restless leg syndrome   . Rheumatic fever   . MVP (mitral valve prolapse)   . Bipolar disorder   . Osteoporosis   . Osteoarthritis   . Diverticulitis     treated at least 5 times in past, hospitalized twice  . Cancer Jan 2013    kidney cancer, left, s/p partial nephrectomy  . Heart murmur   . GERD (gastroesophageal reflux disease)     esophageal spasms - takes nitroglycerin for this  . Anxiety   . Neuropathy    Past Surgical History:  Past Surgical History  Procedure Laterality Date  . Tonsillectomy    . Abdominal hysterectomy    . Cholecystectomy    . Carpell tunnell      rt wrist  x2  . Nissen fundoplication    . Rotator cuff repair    . Robot assisted laparoscopic nephrectomy  08/23/2011    Procedure: ROBOTIC ASSISTED LAPAROSCOPIC NEPHRECTOMY;  Surgeon: Dutch Gray, MD;  Location: [facility];  Service: Urology;  Laterality: Left;  Left Robotic Assisted  Laparoscopic Partial Nephrectomy   . Esophagogastroduodenoscopy  04/06/2010    intact Nissen fundoplication S/P dilation, somewhat baggy atonic appearing esophagus, 58-F dilation  . Esophagogastroduodenoscopy  1/11    nomral esophagus s/p 54-F Maloney dilation, normal/intact Nissen  fundoplication, diffuse patchy erythema and erosions likely NSAID/ASA effect with benign biopsy  . Colonoscopy  5/02    pancolonic deverticula, internal hemorrhoids  . Colonoscopy  1/11    single external hemorrhoidal tag and anal papilla otherwise normal rectum, pancolonic diverticula, s/p sigmoid biopsy and stool sampling all unremarkable  . Back surgery    . Kidney surgery for cancer      Subjective Symptoms/Limitations Symptoms: S:  I really have been doing pretty well.  I can do alot more for myself.  My hand shakes alot when I eat or try to write.  Of course, I cant reach overhead yet.  Limitations: [facility] Protocol: (1) Immediate pendulum exercises (2) immediate elbow, wrist, hand AROM (3) PROM to the affected shoulder for the first 4 weeks (12/17/13) from surgery (4) Goasl: near full PROM by 3-4 weeks post-op (5) At 4 weeks post-op (12/17/13) intiatie AAROM and scapular stabilizers  (6) At 6 weeks post op (12/31/13) initiate shoulder AROM. Gradually advance to progressive resistive exercises. Special Tests: FOTO was 39/100 and is currently  Pain Assessment Currently in Pain?: No/denies   Additional Assessments RUE PROM (degrees) RUE Overall PROM Comments: assessed in supine ER/IR with shoulder adducted (11/22/13) Right Shoulder Flexion: 170 Degrees (8) Right Shoulder ABduction: 170 Degrees (38) Right Shoulder Internal Rotation: 80 Degrees (21) Right Shoulder External Rotation: 82 Degrees (22) RUE Strength Grip (lbs):  45 ((40))     Exercise/Treatments Supine Protraction: PROM;AAROM;10 reps Horizontal ABduction: PROM;AAROM;10 reps External Rotation: PROM;AAROM;10 reps Internal Rotation: PROM;AAROM;10 reps Flexion: PROM;AAROM;10 reps ABduction: PROM;AAROM;10 reps    Manual Therapy Manual Therapy: Myofascial release Myofascial Release: MFR and manual stretching to right upper arm, scapular region, shoulder region to decrease pain and fascial restrictions and improve  pain free mobility.  Occupational Therapy Assessment and Plan OT Assessment and Plan Clinical Impression Statement: Reassessment completed this date. Patient has met 4/5 short term goals and 1/7 long term goals. Transitioned to [facility] in supine this date, per protocol. OT Frequency: Min 2X/week OT Duration: 4 weeks OT Plan: P:  Increase AAROM repetitions in supine and add seated AAROM, add pulleys, thumb tacks and prot/ret//elev/dep.   Goals Short Term Goals Short Term Goal 1: Pt will be educated on HEP Short Term Goal 1 Progress: Met Short Term Goal 2: Pt will achieved right shoulder PROM WFL to facilitate reaching into a washing machine Short Term Goal 2 Progress: Met Short Term Goal 3: Pt will have pain levels of <5/10. Short Term Goal 3 Progress: Met Short Term Goal 4: Pt will have min fascial restrictions in her right bicep, upper arm, and upper trap regions. Short Term Goal 4 Progress: Progressing toward goal Short Term Goal 5: Patient will improve  strength to 3+/5 in right arm for increased ability to lift bags of groceries.  Long Term Goals Long Term Goal 1: Pt will ahcieve highest level of functioning in all ADLs, IADLs, work, and leisure activities. Long Term Goal 1 Progress: Progressing toward goal Long Term Goal 2: Pt will achieve right shoulder AROM to WNL in order to hang laundry to dry. Long Term Goal 2 Progress: Progressing toward goal Long Term Goal 3: Pt will have pain of less than 2/10. Long Term Goal 3 Progress: Met Long Term Goal 4: Pt will have trace fascial restrictions. Long Term Goal 4 Progress: Progressing toward goal Long Term Goal 5: Pt will have ability to draw and administer her mother's insulin shot with no complaints of pain or difficulty, 5/5 times. Long Term Goal 5 Progress: Progressing toward goal Additional Long Term Goals?: Yes Long Term Goal 6: Patient will increase Rt hand strength to 50# to increase the ability to open jars and containers.   Long Term Goal 6 Progress: Progressing toward goal Long Term Goal 7: Patient will have 5/5 strength in her right arm in order to lift bags of dogfood overhead.  Long Term Goal 7 Progress: Progressing toward goal  Problem List Patient Active Problem List   Diagnosis Date Noted  . Pain in joint, shoulder region 11/22/2013  . Decreased range of motion of right shoulder 11/22/2013  . Muscle tightness 11/22/2013  . Muscle weakness (generalized) 11/22/2013  . Low back pain 04/28/2011  . Post laminectomy syndrome 04/28/2011  . DYSPHAGIA 09/26/2009  . DIARRHEA 09/26/2009  . FULL INCONTINENCE OF FECES 09/26/2009    End of Session Activity Tolerance: Patient tolerated treatment well General Behavior During Therapy: [facility] for tasks assessed/performed OT Plan of Care OT Home Exercise Plan: dowel rod exercises OT Patient Instructions: hand out and scanned Consulted and Agree with Plan of Care: Patient  GO Functional Assessment Tool Used: FOTO scored:  59/100 Functional Limitation: Carrying, moving and handling objects Carrying, Moving and Handling Objects Current Status ([id number]): At least 40 percent but less than 60 percent impaired, limited or restricted Carrying, Moving and Handling Objects Goal Status [phone]): At least 20 percent but  less than 40 percent impaired, limited or restricted  Vangie Bicker, OTR/L  12/20/2013, 4:26 PM  Physician Documentation Your signature is required to indicate approval of the treatment plan as stated above.  Please sign and either send electronically or make a copy of this report for your files and return this physician signed original.  Please mark one 1.__approve of plan  2. ___approve of plan with the following conditions.   ______________________________                                                          _____________________ Physician Signature                                                                                                              Date

## 2013-12-25 ENCOUNTER — Ambulatory Visit (HOSPITAL_COMMUNITY)
Admission: RE | Admit: 2013-12-25 | Discharge: 2013-12-25 | Disposition: A | Discharge status: Home / Self Care | Payer: Medicare Other | Source: Ambulatory Visit | Attending: Family Medicine | Admitting: Family Medicine

## 2013-12-25 DIAGNOSIS — M6289 Other specified disorders of muscle: Secondary | ICD-10-CM

## 2013-12-25 DIAGNOSIS — M25519 Pain in unspecified shoulder: Secondary | ICD-10-CM

## 2013-12-25 DIAGNOSIS — M6281 Muscle weakness (generalized): Secondary | ICD-10-CM

## 2013-12-25 DIAGNOSIS — M25611 Stiffness of right shoulder, not elsewhere classified: Secondary | ICD-10-CM

## 2013-12-25 NOTE — Progress Notes (Signed)
Occupational Therapy Treatment Patient Details  Name: Victoria Mccarthy MRN: [id number] Date of Birth: 12/01/1945  Today's Date: 12/25/2013 Time: 6546-5035 OT Time Calculation (min): 46 min Manual therapy 4656-8127 24' Therapeutic exercises 5170-0174 22'  Visit#: 9 of 78  Re-eval: 01/17/14    Authorization: Medicare  Authorization Time Period: before 18th visit  Authorization Visit#: 9 of 18  Subjective Symptoms/Limitations Symptoms: S:  "I've been achey since the last time we did the new exercises." Limitations: [facility] Protocol: (1) Immediate pendulum exercises (2) immediate elbow, wrist, hand AROM (3) PROM to the affected shoulder for the first 4 weeks (12/17/13) from surgery (4) Goasl: near full PROM by 3-4 weeks post-op (5) At 4 weeks post-op (12/17/13) intiatie AAROM and scapular stabilizers  (6) At 6 weeks post op (12/31/13) initiate shoulder AROM. Gradually advance to progressive resistive exercises. Pain Assessment Currently in Pain?: Yes Pain Score: 2  Pain Location: Shoulder Pain Orientation: Right Pain Type: Acute pain  Precautions/Restrictions     Exercise/Treatments Supine Protraction: PROM;AAROM;10 reps Horizontal ABduction: PROM;AAROM;10 reps External Rotation: PROM;AAROM;10 reps Internal Rotation: PROM;AAROM;10 reps Flexion: PROM;AAROM;10 reps ABduction: PROM;AAROM;10 reps Seated Elevation: AROM;20 reps Extension: AROM;20 reps Row: AROM;20 reps Protraction: AAROM;10 reps External Rotation: AAROM;10 reps Internal Rotation: AAROM;10 reps Flexion: AAROM;10 reps Pulleys Flexion: 1 minute ABduction: 1 minute Therapy Ball Flexion: 20 reps ABduction: 20 reps    Manual Therapy Manual Therapy: Myofascial release Myofascial Release: Myofascial release (MFR) and manual stretching to right upper arm, scapular region, shoulder region to decrease pain and fascial restrictions and improve pain free mobility.  Occupational Therapy Assessment and Plan OT  Assessment and Plan Clinical Impression Statement: A:  Completed AAROM seated for protraction, external rotation, internal rotation, and flexion.  Attempted abduction and unable due to increased pain and shoulder weakness.  OT Plan: P:  Add thumbtacks and prot/ret//elev/dep.   Goals Short Term Goals Short Term Goal 1: Pt will be educated on HEP Short Term Goal 2: Pt will achieved right shoulder PROM WFL to facilitate reaching into a washing machine Short Term Goal 3: Pt will have pain levels of <5/10. Short Term Goal 4: Pt will have min fascial restrictions in her right bicep, upper arm, and upper trap regions. Short Term Goal 4 Progress: Progressing toward goal Short Term Goal 5: Patient will improve  strength to 3+/5 in right arm for increased ability to lift bags of groceries.  Short Term Goal 5 Progress: Progressing toward goal Long Term Goals Long Term Goal 1: Pt will ahcieve highest level of functioning in all ADLs, IADLs, work, and leisure activities. Long Term Goal 1 Progress: Progressing toward goal Long Term Goal 2: Pt will achieve right shoulder AROM to WNL in order to hang laundry to dry. Long Term Goal 2 Progress: Progressing toward goal Long Term Goal 3: Pt will have pain of less than 2/10. Long Term Goal 4: Pt will have trace fascial restrictions. Long Term Goal 4 Progress: Progressing toward goal Long Term Goal 5: Pt will have ability to draw and administer her mother's insulin shot with no complaints of pain or difficulty, 5/5 times. Long Term Goal 5 Progress: Progressing toward goal Additional Long Term Goals?: Yes Long Term Goal 6: Patient will increase Rt hand strength to 50# to increase the ability to open jars and containers.  Long Term Goal 6 Progress: Progressing toward goal Long Term Goal 7: Patient will have 5/5 strength in her right arm in order to lift bags of dogfood overhead.  Long Term  Goal 7 Progress: Progressing toward goal  Problem List Patient Active  Problem List   Diagnosis Date Noted  . Pain in joint, shoulder region 11/22/2013  . Decreased range of motion of right shoulder 11/22/2013  . Muscle tightness 11/22/2013  . Muscle weakness (generalized) 11/22/2013  . Low back pain 04/28/2011  . Post laminectomy syndrome 04/28/2011  . DYSPHAGIA 09/26/2009  . DIARRHEA 09/26/2009  . FULL INCONTINENCE OF FECES 09/26/2009    End of Session Activity Tolerance: Patient tolerated treatment well General Behavior During Therapy: [facility] for tasks assessed/performed  GO    Vangie Bicker, OTR/L  12/25/2013, 11:31 AM

## 2013-12-27 ENCOUNTER — Ambulatory Visit (HOSPITAL_COMMUNITY)
Admission: RE | Admit: 2013-12-27 | Discharge: 2013-12-27 | Disposition: A | Discharge status: Home / Self Care | Payer: Medicare Other | Source: Ambulatory Visit | Attending: Family Medicine | Admitting: Family Medicine

## 2013-12-27 NOTE — Progress Notes (Signed)
Occupational Therapy Treatment Patient Details  Name: Victoria Mccarthy MRN: [id number] Date of Birth: November 17, 1945  Today's Date: 12/27/2013 Time: 1107-1150 OT Time Calculation (min): 43 min Manual 1194-1740 (20') Therapeutic Exercises 1127-1150 (23')  Visit#: 10 of 36  Re-eval: 01/17/14    Authorization: Medicare  Authorization Time Period: before 18th visit  Authorization Visit#: 10 of 18  Subjective Symptoms/Limitations Symptoms: "Just when i move it, as a general rule i'm not having any pain at all." Limitations: [facility] Protocol: (1) Immediate pendulum exercises (2) immediate elbow, wrist, hand AROM (3) PROM to the affected shoulder for the first 4 weeks (12/17/13) from surgery (4) Goasl: near full PROM by 3-4 weeks post-op (5) At 4 weeks post-op (12/17/13) intiatie AAROM and scapular stabilizers  (6) At 6 weeks post op (12/31/13) initiate shoulder AROM. Gradually advance to progressive resistive exercises. Pain Assessment Currently in Pain?: No/denies  Exercise/Treatments Supine Protraction: PROM;5 reps;AAROM;12 reps Horizontal ABduction: PROM;5 reps;AAROM;12 reps External Rotation: PROM;5 reps;AAROM;12 reps Internal Rotation: PROM;5 reps;AAROM;12 reps Flexion: PROM;5 reps;AAROM;12 reps ABduction: PROM;5 reps;AAROM;12 reps Seated Elevation: AROM;20 reps Extension: AROM;20 reps Row: AROM;20 reps Protraction: AAROM;10 reps Horizontal ABduction: AAROM;5 reps External Rotation: AAROM;10 reps Internal Rotation: AAROM;10 reps Flexion: AAROM;10 reps Abduction: AAROM;10 reps;Limitations ABduction Limitations: to 90 degrees Pulleys Flexion: 1 minute ABduction: 1 minute ROM / Strengthening / Isometric Strengthening Thumb Tacks: 1 min Prot/Ret//Elev/Dep: 10 reps      Manual Therapy Manual Therapy: Myofascial release Myofascial Release: Myofascial release (MFR) and manual stretching to right upper arm, scapular region, shoulder region to decrease pain and fascial  restrictions and improve pain free mobility.    Occupational Therapy Assessment and Plan OT Assessment and Plan Clinical Impression Statement: Increased reps for supine AAROM - pt fatigued but tolerated well.  Re-attempted seated AAROM abudction and horizontal abudction. Pt had increased tolerance with both, but was only able to complete 5 reps horizontal.  Added thumbtacks and pro/ret/elev/dep this session - fatigued, but able to complete. OT Plan: Pt has MD appointment on Tuesday - assess need for re-eval prior to visit. Continue seated AAROM. Add wall wash.   Goals Short Term Goals Short Term Goal 1: Pt will be educated on HEP Short Term Goal 1 Progress: Met Short Term Goal 2: Pt will achieved right shoulder PROM WFL to facilitate reaching into a washing machine Short Term Goal 2 Progress: Met Short Term Goal 3: Pt will have pain levels of <5/10. Short Term Goal 3 Progress: Met Short Term Goal 4: Pt will have min fascial restrictions in her right bicep, upper arm, and upper trap regions. Short Term Goal 4 Progress: Progressing toward goal Short Term Goal 5: Patient will improve  strength to 3+/5 in right arm for increased ability to lift bags of groceries.  Short Term Goal 5 Progress: Progressing toward goal Long Term Goals Long Term Goal 1: Pt will ahcieve highest level of functioning in all ADLs, IADLs, work, and leisure activities. Long Term Goal 1 Progress: Progressing toward goal Long Term Goal 2: Pt will achieve right shoulder AROM to WNL in order to hang laundry to dry. Long Term Goal 2 Progress: Progressing toward goal Long Term Goal 3: Pt will have pain of less than 2/10. Long Term Goal 3 Progress: Met Long Term Goal 4: Pt will have trace fascial restrictions. Long Term Goal 4 Progress: Progressing toward goal Long Term Goal 5: Pt will have ability to draw and administer her mother's insulin shot with no complaints of pain or difficulty, 5/5  times. Long Term Goal 5 Progress:  Progressing toward goal Long Term Goal 6: Patient will increase Rt hand strength to 50# to increase the ability to open jars and containers.  Long Term Goal 6 Progress: Progressing toward goal Long Term Goal 7: Patient will have 5/5 strength in her right arm in order to lift bags of dogfood overhead.  Long Term Goal 7 Progress: Progressing toward goal  Problem List Patient Active Problem List   Diagnosis Date Noted  . Pain in joint, shoulder region 11/22/2013  . Decreased range of motion of right shoulder 11/22/2013  . Muscle tightness 11/22/2013  . Muscle weakness (generalized) 11/22/2013  . Low back pain 04/28/2011  . Post laminectomy syndrome 04/28/2011  . DYSPHAGIA 09/26/2009  . DIARRHEA 09/26/2009  . FULL INCONTINENCE OF FECES 09/26/2009    End of Session Activity Tolerance: Patient tolerated treatment well General Behavior During Therapy: [facility] for tasks assessed/performed  GO    Bea Graff, MS, OTR/L [phone]  12/27/2013, 11:56 AM

## 2014-01-01 ENCOUNTER — Ambulatory Visit (HOSPITAL_COMMUNITY)
Admission: RE | Admit: 2014-01-01 | Discharge: 2014-01-01 | Disposition: A | Discharge status: Home / Self Care | Payer: Medicare Other | Source: Ambulatory Visit | Attending: Family Medicine | Admitting: Family Medicine

## 2014-01-01 NOTE — Evaluation (Signed)
Occupational Therapy Re-Evaluation  Patient Details  Name: Victoria Mccarthy MRN: [id number] Date of Birth: 12-30-1945  Today's Date: 01/01/2014 Time: 1022-1105 OT Time Calculation (min): 43 min Manual 1022-1032 (10') ROM Measurements 1032-1045 (13') Therapeutic Exercises 1045-1105 (20')  Visit#: 11 of 36  Re-eval: 01/29/14  Assessment Diagnosis: Right Rotator Cuff Surgery Surgical Date: 11/19/13 Next MD Visit: 01/01/14 Prior Therapy: for previous right rotator cuff surgery  Authorization: Medicare  Authorization Time Period: before 21st visit  Authorization Visit#: 11 of 21   Past Medical History:  Past Medical History  Diagnosis Date  . PONV (postoperative nausea and vomiting)   . Arthritis     chronic back pain  . Diarrhea     incontinent stools  . Nocturia   . Urinary frequency   . Anemia   . Depression   . Psychotic affective disorder     "psychotic delusions" "I cut myself"   . Restless leg syndrome   . Rheumatic fever   . MVP (mitral valve prolapse)   . Bipolar disorder   . Osteoporosis   . Osteoarthritis   . Diverticulitis     treated at least 5 times in past, hospitalized twice  . Cancer Jan 2013    kidney cancer, left, s/p partial nephrectomy  . Heart murmur   . GERD (gastroesophageal reflux disease)     esophageal spasms - takes nitroglycerin for this  . Anxiety   . Neuropathy    Past Surgical History:  Past Surgical History  Procedure Laterality Date  . Tonsillectomy    . Abdominal hysterectomy    . Cholecystectomy    . Carpell tunnell      rt wrist  x2  . Nissen fundoplication    . Rotator cuff repair    . Robot assisted laparoscopic nephrectomy  08/23/2011    Procedure: ROBOTIC ASSISTED LAPAROSCOPIC NEPHRECTOMY;  Surgeon: Dutch Gray, MD;  Location: [facility];  Service: Urology;  Laterality: Left;  Left Robotic Assisted  Laparoscopic Partial Nephrectomy   . Esophagogastroduodenoscopy  04/06/2010    intact Nissen fundoplication S/P dilation,  somewhat baggy atonic appearing esophagus, 58-F dilation  . Esophagogastroduodenoscopy  1/11    nomral esophagus s/p 54-F Maloney dilation, normal/intact Nissen fundoplication, diffuse patchy erythema and erosions likely NSAID/ASA effect with benign biopsy  . Colonoscopy  5/02    pancolonic deverticula, internal hemorrhoids  . Colonoscopy  1/11    single external hemorrhoidal tag and anal papilla otherwise normal rectum, pancolonic diverticula, s/p sigmoid biopsy and stool sampling all unremarkable  . Back surgery    . Kidney surgery for cancer      Subjective Symptoms/Limitations Symptoms: "A little stiffness, but not any pain. I'm feeling os much better than I was that very first day. Limitations: [facility] Protocol: (1) Immediate pendulum exercises (2) immediate elbow, wrist, hand AROM (3) PROM to the affected shoulder for the first 4 weeks (12/17/13) from surgery (4) Goasl: near full PROM by 3-4 weeks post-op (5) At 4 weeks post-op (12/17/13) intiatie AAROM and scapular stabilizers  (6) At 6 weeks post op (12/31/13) initiate shoulder AROM. Gradually advance to progressive resistive exercises. Pain Assessment Currently in Pain?: No/denies  Assessment ADL/Vision/Perception ADL ADL Comments: Eating and drinking are still shakey and may require assist with left, but pt is not eating with right hand.  pt no longer had to give her mom insullain shot due to a change in her medication - but t feels she would be able to do it. pt has no  difficulty uncrewing lids.  Additional Assessments RUE AROM (degrees) RUE Overall AROM Comments: Assess in supine, with ER/IR adducted Right Shoulder Flexion: 155 Degrees Right Shoulder ABduction: 156 Degrees Right Shoulder Internal Rotation: 70 Degrees Right Shoulder External Rotation: 25 Degrees RUE PROM (degrees) RUE Overall PROM Comments: assessed in supine ER/IR with shoulder adducted Right Shoulder Flexion: 175 Degrees (170) Right Shoulder  ABduction: 180 Degrees (170) Right Shoulder Internal Rotation: 90 Degrees (80) Right Shoulder External Rotation: 83 Degrees (82) RUE Strength RUE Overall Strength Comments: Assessed in sitting Right Shoulder Flexion: 3/5 Right Shoulder ABduction: 3/5 Right Shoulder Internal Rotation: 4/5 Right Shoulder External Rotation: 3-/5 Palpation Palpation: Pt has mod fascil restrictions in bicep and anterior shoulder regions, and min restrictions in upper trap region.     Exercise/Treatments Supine Protraction: PROM;5 reps;AROM;10 reps Horizontal ABduction: PROM;5 reps;AROM;10 reps External Rotation: PROM;5 reps;AROM;10 reps Internal Rotation: PROM;5 reps;AROM;10 reps Flexion: PROM;5 reps;AROM;10 reps ABduction: PROM;5 reps;AROM;10 reps Seated Protraction: AAROM;10 reps Horizontal ABduction: AAROM;10 reps External Rotation: AAROM;10 reps Internal Rotation: AAROM;10 reps Flexion: AAROM;10 reps Abduction: AAROM;10 reps    Manual Therapy Manual Therapy: Myofascial release Myofascial Release: Myofascial release (MFR) and manual stretching to right upper arm, scapular region, shoulder region to decrease pain and fascial restrictions and improve pain free mobility.    Occupational Therapy Assessment and Plan OT Assessment and Plan Clinical Impression Statement: Initiated supine AROM this session - pt had good tolerance. Reassessment completed this session prior to MD apointment.  Pt is progressing well in therapy, having met 3/5 STG and 1/9 LTG. Pt is progressing towards remaining STG and 6/9 LTG. one LTG has been discontinued and a new one was added in its place. OT Plan: Continue to progress with OT for another 6 weeks. Progress as tolerated with supine AROM and seated AAROM, transitioning to AROM as able.   Goals Short Term Goals Short Term Goal 1: Pt will be educated on HEP Short Term Goal 1 Progress: Met Short Term Goal 2: Pt will achieved right shoulder PROM WFL to facilitate reaching  into a washing machine Short Term Goal 2 Progress: Met Short Term Goal 3: Pt will have pain levels of <5/10. Short Term Goal 3 Progress: Met Short Term Goal 4: Pt will have min fascial restrictions in her right bicep, upper arm, and upper trap regions. Short Term Goal 4 Progress: Progressing toward goal Short Term Goal 5: Patient will improve  strength to 3+/5 in right arm for increased ability to lift bags of groceries.  Short Term Goal 5 Progress: Progressing toward goal Long Term Goals Long Term Goal 1: Pt will ahcieve highest level of functioning in all ADLs, IADLs, work, and leisure activities. Long Term Goal 1 Progress: Progressing toward goal Long Term Goal 2: Pt will achieve right shoulder AROM to WNL in order to hang laundry to dry. Long Term Goal 2 Progress: Progressing toward goal Long Term Goal 3: Pt will have pain of less than 2/10. Long Term Goal 3 Progress: Met Long Term Goal 4: Pt will have trace fascial restrictions. Long Term Goal 4 Progress: Progressing toward goal Long Term Goal 5: Pt will have ability to draw and administer her mother's insulin shot with no complaints of pain or difficulty, 5/5 times. Long Term Goal 5 Progress: Discontinued (comment) (Pt's mother uses a new kind of insulin shot and does not require assist) Additional Long Term Goals?: Yes Long Term Goal 6: Patient will increase Rt hand strength to 50# to increase the ability to open  jars and containers.  Long Term Goal 6 Progress: Progressing toward goal Long Term Goal 7: Patient will have 5/5 strength in her right arm in order to lift bags of dogfood overhead.  Long Term Goal 7 Progress: Progressing toward goal Long Term Goal 8: Pt will be able to shoot her handguns with no complaint of pain. Long Term Goal 8 Progress: Progressing toward goal  Problem List Patient Active Problem List   Diagnosis Date Noted  . Pain in joint, shoulder region 11/22/2013  . Decreased range of motion of right shoulder  11/22/2013  . Muscle tightness 11/22/2013  . Muscle weakness (generalized) 11/22/2013  . Low back pain 04/28/2011  . Post laminectomy syndrome 04/28/2011  . DYSPHAGIA 09/26/2009  . DIARRHEA 09/26/2009  . FULL INCONTINENCE OF FECES 09/26/2009    End of Session Activity Tolerance: Patient tolerated treatment well General Behavior During Therapy: [facility] for tasks assessed/performed  GO Functional Assessment Tool Used: Foto scored: 65/100 Functional Limitation: Carrying, moving and handling objects Carrying, Moving and Handling Objects Current Status [phone]): At least 20 percent but less than 40 percent impaired, limited or restricted Carrying, Moving and Handling Objects Goal Status [phone]): At least 1 percent but less than 20 percent impaired, limited or restricted  Bea Graff, [place], OTR/L [phone]  01/01/2014, 12:06 PM  Physician Documentation Your signature is required to indicate approval of the treatment plan as stated above.  Please sign and either send electronically or make a copy of this report for your files and return this physician signed original.  Please mark one 1.__approve of plan  2. ___approve of plan with the following conditions.   ______________________________                                                          _____________________ Physician Signature                                                                                                             Date

## 2014-01-03 ENCOUNTER — Ambulatory Visit (HOSPITAL_COMMUNITY)
Admission: RE | Admit: 2014-01-03 | Discharge: 2014-01-03 | Disposition: A | Discharge status: Home / Self Care | Payer: Medicare Other | Source: Ambulatory Visit | Attending: Family Medicine | Admitting: Family Medicine

## 2014-01-03 NOTE — Progress Notes (Signed)
Occupational Therapy Treatment Patient Details  Name: Victoria Mccarthy MRN: [id number] Date of Birth: 07/23/1946  Today's Date: 01/03/2014 Time: 1022-1104 OT Time Calculation (min): 42 min Manual 1022-1037 (15') Therapeutic Exercises 1037-1104 (27')  Visit#: 12 of 36  Re-eval: 01/29/14    Authorization: Medicare  Authorization Time Period: before 21st visit  Authorization Visit#: 12 of 21  Subjective Symptoms/Limitations Symptoms: "I went to the doctor yyesterday, and they want me to wear the sling for another month.  and I think one of those exercises sitting up made me more sore too." Limitations:  Ortho Protocol: (1) Immediate pendulum exercises (2) immediate elbow, wrist, hand AROM (3) PROM to the affected shoulder for the first 4 weeks (12/17/13) from surgery (4) Goasl: near full PROM by 3-4 weeks post-op (5) At 4 weeks post-op (12/17/13) intiatie AAROM and scapular stabilizers  (6) At 6 weeks post op (12/31/13) initiate shoulder AROM. Gradually advance to progressive resistive exercises. Pain Assessment Currently in Pain?: Yes Pain Score: 1  Pain Location: Back Pain Orientation: Right Pain Type: Acute pain (achey)   Exercise/Treatments Supine Protraction: PROM;5 reps;AROM;12 reps Horizontal ABduction: PROM;5 reps;AROM;12 reps External Rotation: 5 reps;PROM;AROM;12 reps Internal Rotation: PROM;5 reps;AROM;12 reps Flexion: PROM;5 reps;AROM;12 reps ABduction: PROM;5 reps;AROM;12 reps Seated Protraction: AAROM;12 reps Horizontal ABduction: AAROM;12 reps External Rotation: AAROM;12 reps Internal Rotation: AAROM;12 reps Flexion: AAROM;12 reps Abduction: AAROM;12 reps ROM / Strengthening / Isometric Strengthening Proximal Shoulder Strengthening, Supine: 10 times each with no breaks (min pain with circles)    Manual Therapy Manual Therapy: Myofascial release Myofascial Release: Myofascial release (MFR) and manual stretching to right upper arm, scapular region,  shoulder region to decrease pain and fascial restrictions and improve pain free mobility.    Occupational Therapy Assessment and Plan OT Assessment and Plan Clinical Impression Statement: Pt had some increased soreness from previou session. MD appointment went well - pt to continue wearing sling for another month.  Increased reps AROm and AAROM- Pt tolerated well. Initiated supiner proximal shoulder strengthening OT Plan: Progress AROM, AAROm as tolerated. Add scapular t-band.   Goals Short Term Goals Short Term Goal 1: Pt will be educated on HEP Short Term Goal 1 Progress: Met Short Term Goal 2: Pt will achieved right shoulder PROM WFL to facilitate reaching into a washing machine Short Term Goal 2 Progress: Met Short Term Goal 3: Pt will have pain levels of <5/10. Short Term Goal 3 Progress: Met Short Term Goal 4: Pt will have min fascial restrictions in her right bicep, upper arm, and upper trap regions. Short Term Goal 4 Progress: Progressing toward goal Short Term Goal 5: Patient will improve  strength to 3+/5 in right arm for increased ability to lift bags of groceries.  Short Term Goal 5 Progress: Progressing toward goal Long Term Goals Long Term Goal 1: Pt will ahcieve highest level of functioning in all ADLs, IADLs, work, and leisure activities. Long Term Goal 1 Progress: Progressing toward goal Long Term Goal 2: Pt will achieve right shoulder AROM to WNL in order to hang laundry to dry. Long Term Goal 2 Progress: Progressing toward goal Long Term Goal 3: Pt will have pain of less than 2/10. Long Term Goal 3 Progress: Met Long Term Goal 4: Pt will have trace fascial restrictions. Long Term Goal 4 Progress: Progressing toward goal Long Term Goal 5: Pt will have ability to draw and administer her mother's insulin shot with no complaints of pain or difficulty, 5/5 times. Long Term Goal 5 Progress: Discontinued (comment)  Long Term Goal 6: Patient will increase Rt hand strength to  50# to increase the ability to open jars and containers.  Long Term Goal 6 Progress: Progressing toward goal Long Term Goal 7: Patient will have 5/5 strength in her right arm in order to lift bags of dogfood overhead.  Long Term Goal 7 Progress: Progressing toward goal Long Term Goal 8: Pt will be able to shoot her handguns with no complaint of pain. Long Term Goal 8 Progress: Progressing toward goal  Problem List Patient Active Problem List   Diagnosis Date Noted  . Pain in joint, shoulder region 11/22/2013  . Decreased range of motion of right shoulder 11/22/2013  . Muscle tightness 11/22/2013  . Muscle weakness (generalized) 11/22/2013  . Low back pain 04/28/2011  . Post laminectomy syndrome 04/28/2011  . DYSPHAGIA 09/26/2009  . DIARRHEA 09/26/2009  . FULL INCONTINENCE OF FECES 09/26/2009    End of Session Activity Tolerance: Patient tolerated treatment well General Behavior During Therapy: [facility] for tasks assessed/performed  GO    Bea Graff, MS, OTR/L [phone]  01/03/2014, 12:43 PM

## 2014-01-08 ENCOUNTER — Ambulatory Visit (HOSPITAL_COMMUNITY)
Admission: RE | Admit: 2014-01-08 | Discharge: 2014-01-08 | Disposition: A | Discharge status: Home / Self Care | Payer: Medicare Other | Source: Ambulatory Visit | Attending: Family Medicine | Admitting: Family Medicine

## 2014-01-08 ENCOUNTER — Ambulatory Visit: Payer: Medicare Other | Admitting: Gastroenterology

## 2014-01-08 DIAGNOSIS — F314 Bipolar disorder, current episode depressed, severe, without psychotic features: Secondary | ICD-10-CM | POA: Diagnosis not present

## 2014-01-08 NOTE — Progress Notes (Signed)
Occupational Therapy Treatment Patient Details  Name: Victoria Mccarthy MRN: [id number] Date of Birth: July 30, 1946  Today's Date: 01/08/2014 Time: 1751-0258 OT Time Calculation (min): 35 min MFR 1017-1030 13' Therex 1030-1052 22;  Visit#: 13 of 36  Re-eval: 01/29/14    Authorization: Medicare  Authorization Time Period: before 21st visit  Authorization Visit#: 13 of 21  Subjective Symptoms/Limitations Symptoms: S: I'm always sore after I do my exercises.  Pain Assessment Currently in Pain?: No/denies  Precautions/Restrictions  Precautions Precautions: Shoulder Precaution Comments: [facility] protocol (scanned)  Exercise/Treatments Supine Protraction: PROM;5 reps;AROM;12 reps Horizontal ABduction: PROM;5 reps;AROM;12 reps External Rotation: 5 reps;PROM;AROM;12 reps Internal Rotation: PROM;5 reps;AROM;12 reps Flexion: PROM;5 reps;AROM;12 reps ABduction: PROM;5 reps;AROM;12 reps Seated Elevation: AROM;10 reps Extension: AROM;10 reps Row: AROM;10 reps Protraction: AROM;12 reps Horizontal ABduction: AROM;12 reps External Rotation: AROM;12 reps Internal Rotation: AROM;12 reps Flexion: AROM;12 reps Abduction: AROM;12 reps Standing Extension: Theraband;10 reps Theraband Level (Shoulder Extension): Level 2 (Red) Row: Theraband;10 reps Theraband Level (Shoulder Row): Level 2 (Red) Retraction: Theraband;10 reps Theraband Level (Shoulder Retraction): Level 2 (Red) ROM / Strengthening / Isometric Strengthening Wall Wash: 1' "W" Arms: 10X X to V Arms: 10X Proximal Shoulder Strengthening, Supine: 10 times each with no breaks Proximal Shoulder Strengthening, Seated: 10 times no rest breaks Flexion:  (d/c) Extension:  (d/c) External Rotation:  (d/c) Internal Rotation:  (d/c) ABduction:  (d/c) ADduction:  (d/c)      Manual Therapy Manual Therapy: Myofascial release Myofascial Release: Myofascial release (MFR) and manual stretching to right upper arm,  scapular region, shoulder region to decrease pain and fascial restrictions and improve pain free mobility.  Occupational Therapy Assessment and Plan OT Assessment and Plan Clinical Impression Statement: A: completed all AROM in supine and seated. Added wall wash, theraband exercises for scapular strengthening, W arms, and X to V arms. Patient tolerated well.  OT Plan: P: Add UBE bike.    Goals Short Term Goals Short Term Goal 1: Pt will be educated on HEP Short Term Goal 2: Pt will achieved right shoulder PROM WFL to facilitate reaching into a washing machine Short Term Goal 3: Pt will have pain levels of <5/10. Short Term Goal 4: Pt will have min fascial restrictions in her right bicep, upper arm, and upper trap regions. Short Term Goal 4 Progress: Progressing toward goal Short Term Goal 5: Patient will improve  strength to 3+/5 in right arm for increased ability to lift bags of groceries.  Short Term Goal 5 Progress: Progressing toward goal Long Term Goals Long Term Goal 1: Pt will ahcieve highest level of functioning in all ADLs, IADLs, work, and leisure activities. Long Term Goal 1 Progress: Progressing toward goal Long Term Goal 2: Pt will achieve right shoulder AROM to WNL in order to hang laundry to dry. Long Term Goal 2 Progress: Progressing toward goal Long Term Goal 3: Pt will have pain of less than 2/10. Long Term Goal 4: Pt will have trace fascial restrictions. Long Term Goal 4 Progress: Progressing toward goal Long Term Goal 5: Pt will have ability to draw and administer her mother's insulin shot with no complaints of pain or difficulty, 5/5 times. Long Term Goal 6: Patient will increase Rt hand strength to 50# to increase the ability to open jars and containers.  Long Term Goal 6 Progress: Progressing toward goal Long Term Goal 7: Patient will have 5/5 strength in her right arm in order to lift bags of dogfood overhead.  Long Term Goal 7 Progress: Progressing  toward  goal Long Term Goal 8: Pt will be able to shoot her handguns with no complaint of pain. Long Term Goal 8 Progress: Progressing toward goal  Problem List Patient Active Problem List   Diagnosis Date Noted  . Pain in joint, shoulder region 11/22/2013  . Decreased range of motion of right shoulder 11/22/2013  . Muscle tightness 11/22/2013  . Muscle weakness (generalized) 11/22/2013  . Low back pain 04/28/2011  . Post laminectomy syndrome 04/28/2011  . DYSPHAGIA 09/26/2009  . DIARRHEA 09/26/2009  . FULL INCONTINENCE OF FECES 09/26/2009    End of Session Activity Tolerance: Patient tolerated treatment well General Behavior During Therapy: [facility] for tasks assessed/performed   Ailene Ravel, OTR/L,CBIS   01/08/2014, 11:04 AM

## 2014-01-10 ENCOUNTER — Ambulatory Visit (HOSPITAL_COMMUNITY)
Admission: RE | Admit: 2014-01-10 | Discharge: 2014-01-10 | Disposition: A | Discharge status: Home / Self Care | Payer: Medicare Other | Source: Ambulatory Visit | Attending: Family Medicine | Admitting: Family Medicine

## 2014-01-10 NOTE — Progress Notes (Signed)
Occupational Therapy Treatment Patient Details  Name: Victoria Mccarthy MRN: [id number] Date of Birth: Sep 29, 1945  Today's Date: 01/10/2014 Time: 1015-1101 OT Time Calculation (min): 46 min Manual 1015-1030 (15') Therapeutic Exericses 1030-1101 (31')  Visit#: 14 of 36  Re-eval: 01/29/14    Authorization: Medicare  Authorization Time Period: before 21st visit  Authorization Visit#: 14 of 21  Subjective Symptoms/Limitations Symptoms: "I got so busy I just forgot my brace today. But I feel ok without it." Limitations: [facility] Protocol: (1) Immediate pendulum exercises (2) immediate elbow, wrist, hand AROM (3) PROM to the affected shoulder for the first 4 weeks (12/17/13) from surgery (4) Goasl: near full PROM by 3-4 weeks post-op (5) At 4 weeks post-op (12/17/13) intiatie AAROM and scapular stabilizers  (6) At 6 weeks post op (12/31/13) initiate shoulder AROM. Gradually advance to progressive resistive exercises. Pain Assessment Currently in Pain?: No/denies  Precautions/Restrictions     Exercise/Treatments Supine Protraction: PROM;5 reps;AROM;12 reps Horizontal ABduction: PROM;5 reps;AROM;12 reps External Rotation: PROM;5 reps;AROM;12 reps Internal Rotation: PROM;5 reps;AROM;12 reps Flexion: PROM;5 reps;AROM;12 reps ABduction: PROM;5 reps;AROM;12 reps Seated Extension: AROM;10 reps Row: AROM;10 reps Protraction: AROM;12 reps Horizontal ABduction: AROM;12 reps External Rotation: AROM;12 reps Internal Rotation: AROM;12 reps Flexion: AROM;12 reps Abduction: AROM;12 reps Standing Extension: Theraband;10 reps Theraband Level (Shoulder Extension): Level 2 (Red) Row: Theraband;10 reps Theraband Level (Shoulder Row): Level 2 (Red) Retraction: Theraband;10 reps Theraband Level (Shoulder Retraction): Level 2 (Red) ROM / Strengthening / Isometric Strengthening UBE (Upper Arm Bike): 2' forward and back on 1.0 "W" Arms: 12X X to V Arms: 12X Proximal Shoulder Strengthening,  Supine: 10 times each with no breaks Proximal Shoulder Strengthening, Seated: 10 times no rest breaks   Manual Therapy Manual Therapy: Myofascial release Myofascial Release: Myofascial release (MFR) and manual stretching to right upper arm, scapular region, shoulder region to decrease pain and fascial restrictions and improve pain free mobility.    Occupational Therapy Assessment and Plan OT Assessment and Plan Clinical Impression Statement: Continued supine and seated AROM - pt had good tolerance with some fatigue.  Pt able to increased x-v and w arms to 12 reps, but with significant fagitue with x-v.  Added UBE this session - pt had good tolerance. OT Plan: Continue seated AROM, x-v and w arms. Increase supine reps. Increase UBE resistence.   Goals Short Term Goals Short Term Goal 1: Pt will be educated on HEP Short Term Goal 1 Progress: Met Short Term Goal 2: Pt will achieved right shoulder PROM WFL to facilitate reaching into a washing machine Short Term Goal 2 Progress: Met Short Term Goal 3: Pt will have pain levels of <5/10. Short Term Goal 3 Progress: Met Short Term Goal 4: Pt will have min fascial restrictions in her right bicep, upper arm, and upper trap regions. Short Term Goal 4 Progress: Progressing toward goal Short Term Goal 5: Patient will improve  strength to 3+/5 in right arm for increased ability to lift bags of groceries.  Short Term Goal 5 Progress: Progressing toward goal Long Term Goals Long Term Goal 1: Pt will ahcieve highest level of functioning in all ADLs, IADLs, work, and leisure activities. Long Term Goal 1 Progress: Progressing toward goal Long Term Goal 2: Pt will achieve right shoulder AROM to WNL in order to hang laundry to dry. Long Term Goal 2 Progress: Progressing toward goal Long Term Goal 3: Pt will have pain of less than 2/10. Long Term Goal 3 Progress: Met Long Term Goal 4: Pt will have  trace fascial restrictions. Long Term Goal 4 Progress:  Progressing toward goal Long Term Goal 5: Pt will have ability to draw and administer her mother's insulin shot with no complaints of pain or difficulty, 5/5 times. Long Term Goal 5 Progress: Discontinued (comment) Long Term Goal 6: Patient will increase Rt hand strength to 50# to increase the ability to open jars and containers.  Long Term Goal 6 Progress: Progressing toward goal Long Term Goal 7: Patient will have 5/5 strength in her right arm in order to lift bags of dogfood overhead.  Long Term Goal 7 Progress: Progressing toward goal Long Term Goal 8: Pt will be able to shoot her handguns with no complaint of pain. Long Term Goal 8 Progress: Progressing toward goal  Problem List Patient Active Problem List   Diagnosis Date Noted  . Pain in joint, shoulder region 11/22/2013  . Decreased range of motion of right shoulder 11/22/2013  . Muscle tightness 11/22/2013  . Muscle weakness (generalized) 11/22/2013  . Low back pain 04/28/2011  . Post laminectomy syndrome 04/28/2011  . DYSPHAGIA 09/26/2009  . DIARRHEA 09/26/2009  . FULL INCONTINENCE OF FECES 09/26/2009    End of Session Activity Tolerance: Patient tolerated treatment well General Behavior During Therapy: [facility] for tasks assessed/performed  GO    Bea Graff, MS, OTR/L [phone]  01/10/2014, 1:15 PM

## 2014-01-15 ENCOUNTER — Ambulatory Visit (HOSPITAL_COMMUNITY)
Admission: RE | Admit: 2014-01-15 | Discharge: 2014-01-15 | Disposition: A | Discharge status: Home / Self Care | Payer: Medicare Other | Source: Ambulatory Visit | Attending: Family Medicine | Admitting: Family Medicine

## 2014-01-15 NOTE — Progress Notes (Signed)
Occupational Therapy Treatment Patient Details  Name: Victoria Mccarthy MRN: [id number] Date of Birth: [birth year]  Today's Date: 01/15/2014 Time: 4098-1191 OT Time Calculation (min): 39 min Manual 1023-1039 (16') Therapeutic Exercises 1039-1102 (23')  Visit#: 15 of 36  Re-eval: 01/29/14    Authorization: Medicare  Authorization Time Period: before 21st visit  Authorization Visit#: 15 of 21  Subjective Symptoms/Limitations Symptoms: "I've pretty much weaned myself off the sling. I feel pretty good." Limitations: [facility] Protocol: (1) Immediate pendulum exercises (2) immediate elbow, wrist, hand AROM (3) PROM to the affected shoulder for the first 4 weeks (12/17/13) from surgery (4) Goasl: near full PROM by 3-4 weeks post-op (5) At 4 weeks post-op (12/17/13) intiatie AAROM and scapular stabilizers  (6) At 6 weeks post op (12/31/13) initiate shoulder AROM. Gradually advance to progressive resistive exercises. Pain Assessment Currently in Pain?: No/denies  Precautions/Restrictions     Exercise/Treatments Supine Protraction: PROM;5 reps;AROM;15 reps Horizontal ABduction: PROM;5 reps;AROM;15 reps External Rotation: PROM;5 reps;AROM;15 reps Internal Rotation: PROM;5 reps;AROM;15 reps Flexion: PROM;5 reps;AROM;15 reps ABduction: PROM;5 reps;AROM;15 reps Standing Protraction: AROM;12 reps Horizontal ABduction: AROM;12 reps External Rotation: AROM;12 reps Internal Rotation: AROM;12 reps Flexion: AROM;12 reps ABduction: AROM;12 reps ROM / Strengthening / Isometric Strengthening UBE (Upper Arm Bike): 2' forward and back on 1.5 "W" Arms: 12X X to V Arms: 12X Proximal Shoulder Strengthening, Supine: 12 times each with no breaks - up/down, crisscross, and circles Proximal Shoulder Strengthening, Seated: 12 times with no rest breaks, in standing   Manual Therapy Manual Therapy: Myofascial release Myofascial Release: Myofascial release (MFR) and manual stretching to right upper  arm, scapular region, shoulder region to decrease pain and fascial restrictions and improve pain free mobility.    Occupational Therapy Assessment and Plan OT Assessment and Plan Clinical Impression Statement: increased supine reps - pt had good tolerance.  Less fatigue with x-v arms this session. increased UBE resistence and pt had good tolerance. OT Plan: increase standing AROM reps.  Continue theraband.   Goals Short Term Goals Short Term Goal 1: Pt will be educated on HEP Short Term Goal 1 Progress: Met Short Term Goal 2: Pt will achieved right shoulder PROM WFL to facilitate reaching into a washing machine Short Term Goal 2 Progress: Met Short Term Goal 3: Pt will have pain levels of <5/10. Short Term Goal 3 Progress: Met Short Term Goal 4: Pt will have min fascial restrictions in her right bicep, upper arm, and upper trap regions. Short Term Goal 4 Progress: Progressing toward goal Short Term Goal 5: Patient will improve  strength to 3+/5 in right arm for increased ability to lift bags of groceries.  Short Term Goal 5 Progress: Progressing toward goal Long Term Goals Long Term Goal 1: Pt will ahcieve highest level of functioning in all ADLs, IADLs, work, and leisure activities. Long Term Goal 1 Progress: Progressing toward goal Long Term Goal 2: Pt will achieve right shoulder AROM to WNL in order to hang laundry to dry. Long Term Goal 2 Progress: Progressing toward goal Long Term Goal 3: Pt will have pain of less than 2/10. Long Term Goal 3 Progress: Met Long Term Goal 4: Pt will have trace fascial restrictions. Long Term Goal 4 Progress: Progressing toward goal Long Term Goal 5: Pt will have ability to draw and administer her mother's insulin shot with no complaints of pain or difficulty, 5/5 times. Long Term Goal 5 Progress: Discontinued (comment) Long Term Goal 6: Patient will increase Rt hand strength to 50# to  increase the ability to open jars and containers.  Long Term Goal  6 Progress: Progressing toward goal Long Term Goal 7: Patient will have 5/5 strength in her right arm in order to lift bags of dogfood overhead.  Long Term Goal 7 Progress: Progressing toward goal Long Term Goal 8: Pt will be able to shoot her handguns with no complaint of pain. Long Term Goal 8 Progress: Progressing toward goal  Problem List Patient Active Problem List   Diagnosis Date Noted  . Pain in joint, shoulder region 11/22/2013  . Decreased range of motion of right shoulder 11/22/2013  . Muscle tightness 11/22/2013  . Muscle weakness (generalized) 11/22/2013  . Low back pain 04/28/2011  . Post laminectomy syndrome 04/28/2011  . DYSPHAGIA 09/26/2009  . DIARRHEA 09/26/2009  . FULL INCONTINENCE OF FECES 09/26/2009    End of Session Activity Tolerance: Patient tolerated treatment well General Behavior During Therapy: [facility] for tasks assessed/performed  GO   Bea Graff, MS, OTR/L [phone]  01/15/2014, 11:00 AM

## 2014-01-17 ENCOUNTER — Ambulatory Visit (HOSPITAL_COMMUNITY)
Admission: RE | Admit: 2014-01-17 | Discharge: 2014-01-17 | Disposition: A | Discharge status: Home / Self Care | Payer: Medicare Other | Source: Ambulatory Visit | Attending: Family Medicine | Admitting: Family Medicine

## 2014-01-17 DIAGNOSIS — M6289 Other specified disorders of muscle: Secondary | ICD-10-CM

## 2014-01-17 DIAGNOSIS — M25611 Stiffness of right shoulder, not elsewhere classified: Secondary | ICD-10-CM

## 2014-01-17 DIAGNOSIS — M25519 Pain in unspecified shoulder: Secondary | ICD-10-CM

## 2014-01-17 DIAGNOSIS — M6281 Muscle weakness (generalized): Secondary | ICD-10-CM

## 2014-01-17 NOTE — Progress Notes (Signed)
Occupational Therapy Treatment Patient Details  Name: Victoria Mccarthy MRN: [id number] Date of Birth: October 05, 1945  Today's Date: 01/17/2014 Time: 1022-1104 OT Time Calculation (min): 42 min Manual therapy 1022-1040 18' Therapeutic exercises [address] 24'  Visit#: 16 of 55  Re-eval: 01/29/14    Authorization: Medicare  Authorization Time Period: before 21st visit  Authorization Visit#: 16 of 21  Subjective Symptoms/Limitations Symptoms: S:  I have more pain in my back than my shoulder.  Limitations: [place] [facility] Protocol: (1) Immediate pendulum exercises (2) immediate elbow, wrist, hand AROM (3) PROM to the affected shoulder for the first 4 weeks (12/17/13) from surgery (4) Goasl: near full PROM by 3-4 weeks post-op (5) At 4 weeks post-op (12/17/13) intiatie AAROM and scapular stabilizers  (6) At 6 weeks post op (12/31/13) initiate shoulder AROM. Gradually advance to progressive resistive exercises. Special Tests: 65/100 Pain Assessment Currently in Pain?: No/denies  Precautions/Restrictions   see above  Exercise/Treatments Supine Protraction: PROM;5 reps;AROM;15 reps Horizontal ABduction: PROM;5 reps;AROM;15 reps External Rotation: PROM;5 reps;AROM;15 reps Internal Rotation: PROM;5 reps;AROM;15 reps Flexion: PROM;5 reps;AROM;15 reps ABduction: PROM;5 reps;AROM;15 reps Seated Protraction: 15 reps;AROM Horizontal ABduction: AROM;15 reps External Rotation: AROM;15 reps Internal Rotation: AROM;15 reps Flexion: AROM;15 reps Abduction: AROM;15 reps Standing External Rotation: Theraband;15 reps Theraband Level (Shoulder External Rotation): Level 2 (Red) Internal Rotation: Theraband;15 reps Theraband Level (Shoulder Internal Rotation): Level 2 (Red) Extension: Theraband;15 reps Theraband Level (Shoulder Extension): Level 2 (Red) Row: Theraband;15 reps Theraband Level (Shoulder Row): Level 2 (Red) Retraction: Theraband;15 reps Theraband Level (Shoulder Retraction): Level 2  (Red) ROM / Strengthening / Isometric Strengthening UBE (Upper Arm Bike): 3' forward and 3' reverse at 1.5 Wall Wash: resume next session "W" Arms: 15X X to V Arms: 15X Proximal Shoulder Strengthening, Supine: 12 times each with no breaks - up/down, crisscross, and circles Proximal Shoulder Strengthening, Seated: 12 times with no rest breaks, in standing      Manual Therapy Manual Therapy: Myofascial release Myofascial Release: Myofascial release (MFR) and manual stretching to right upper arm, scapular region, shoulder region to decrease pain and fascial restrictions and improve pain free mobility. dc to PRN after todays treatment  Occupational Therapy Assessment and Plan OT Assessment and Plan Clinical Impression Statement: A: Min-trace fascial restrictions noted this date.  transitioned to seated AROM.   OT Plan: P:  attempt 1 pound strengthening in supine. DC MFR to PRN   Goals Short Term Goals Short Term Goal 1: Pt will be educated on HEP Short Term Goal 2: Pt will achieved right shoulder PROM WFL to facilitate reaching into a washing machine Short Term Goal 3: Pt will have pain levels of <5/10. Short Term Goal 4: Pt will have min fascial restrictions in her right bicep, upper arm, and upper trap regions. Short Term Goal 5: Patient will improve  strength to 3+/5 in right arm for increased ability to lift bags of groceries.  Long Term Goals Long Term Goal 1: Pt will ahcieve highest level of functioning in all ADLs, IADLs, work, and leisure activities. Long Term Goal 2: Pt will achieve right shoulder AROM to WNL in order to hang laundry to dry. Long Term Goal 3: Pt will have pain of less than 2/10. Long Term Goal 4: Pt will have trace fascial restrictions. Long Term Goal 5: Pt will have ability to draw and administer her mother's insulin shot with no complaints of pain or difficulty, 5/5 times. Long Term Goal 6: Patient will increase Rt hand strength to 50# to increase the  ability  to open jars and containers.  Long Term Goal 7: Patient will have 5/5 strength in her right arm in order to lift bags of dogfood overhead.  Long Term Goal 8: Pt will be able to shoot her handguns with no complaint of pain.  Problem List Patient Active Problem List   Diagnosis Date Noted  . Pain in joint, shoulder region 11/22/2013  . Decreased range of motion of right shoulder 11/22/2013  . Muscle tightness 11/22/2013  . Muscle weakness (generalized) 11/22/2013  . Low back pain 04/28/2011  . Post laminectomy syndrome 04/28/2011  . DYSPHAGIA 09/26/2009  . DIARRHEA 09/26/2009  . FULL INCONTINENCE OF FECES 09/26/2009    End of Session Activity Tolerance: Patient tolerated treatment well General Behavior During Therapy: [facility] for tasks assessed/performed  [place], OTR/L [phone]  01/17/2014, 10:59 AM

## 2014-01-22 ENCOUNTER — Other Ambulatory Visit (HOSPITAL_COMMUNITY): Payer: Self-pay | Admitting: Family Medicine

## 2014-01-22 ENCOUNTER — Ambulatory Visit (HOSPITAL_COMMUNITY)
Admission: RE | Admit: 2014-01-22 | Discharge: 2014-01-22 | Disposition: A | Discharge status: Home / Self Care | Payer: Medicare Other | Source: Ambulatory Visit | Attending: Family Medicine | Admitting: Family Medicine

## 2014-01-22 DIAGNOSIS — Z0000 Encounter for general adult medical examination without abnormal findings: Secondary | ICD-10-CM | POA: Diagnosis not present

## 2014-01-22 DIAGNOSIS — M545 Low back pain, unspecified: Secondary | ICD-10-CM | POA: Diagnosis not present

## 2014-01-22 DIAGNOSIS — IMO0001 Reserved for inherently not codable concepts without codable children: Secondary | ICD-10-CM | POA: Insufficient documentation

## 2014-01-22 DIAGNOSIS — M25519 Pain in unspecified shoulder: Secondary | ICD-10-CM | POA: Diagnosis not present

## 2014-01-22 DIAGNOSIS — Z1231 Encounter for screening mammogram for malignant neoplasm of breast: Secondary | ICD-10-CM

## 2014-01-22 DIAGNOSIS — Z780 Asymptomatic menopausal state: Secondary | ICD-10-CM

## 2014-01-22 DIAGNOSIS — K219 Gastro-esophageal reflux disease without esophagitis: Secondary | ICD-10-CM | POA: Diagnosis not present

## 2014-01-22 DIAGNOSIS — Z79899 Other long term (current) drug therapy: Secondary | ICD-10-CM | POA: Diagnosis not present

## 2014-01-22 DIAGNOSIS — G4700 Insomnia, unspecified: Secondary | ICD-10-CM | POA: Diagnosis not present

## 2014-01-22 DIAGNOSIS — Z6834 Body mass index (BMI) 34.0-34.9, adult: Secondary | ICD-10-CM | POA: Diagnosis not present

## 2014-01-22 DIAGNOSIS — M25619 Stiffness of unspecified shoulder, not elsewhere classified: Secondary | ICD-10-CM | POA: Insufficient documentation

## 2014-01-22 DIAGNOSIS — Z23 Encounter for immunization: Secondary | ICD-10-CM | POA: Diagnosis not present

## 2014-01-22 DIAGNOSIS — Z139 Encounter for screening, unspecified: Secondary | ICD-10-CM

## 2014-01-22 DIAGNOSIS — M6281 Muscle weakness (generalized): Secondary | ICD-10-CM | POA: Insufficient documentation

## 2014-01-22 NOTE — Progress Notes (Signed)
Occupational Therapy Treatment Patient Details  Name: Victoria Mccarthy MRN: [id number] Date of Birth: 04-30-1946  Today's Date: 01/22/2014 Time: 1022-1105 OT Time Calculation (min): 43 min There Ex 43'  Visit#: 17 of 36  Re-eval: 01/29/14    Authorization: Medicare  Authorization Time Period: before 21st visit  Authorization Visit#: 17 of 21  Subjective Symptoms/Limitations Symptoms: "I don't know if i've messed up my hardware - i've just got pain down my back into my hips and the backs of my legs.  Its just started suddenly about 5 days ago. I thought i was through with back problems." Limitations: [facility] [place] [facility] Protocol: (1) Immediate pendulum exercises (2) immediate elbow, wrist, hand AROM (3) PROM to the affected shoulder for the first 4 weeks (12/17/13) from surgery (4) Goasl: near full PROM by 3-4 weeks post-op (5) At 4 weeks post-op (12/17/13) intiatie AAROM and scapular stabilizers  (6) At 6 weeks post op (12/31/13) initiate shoulder AROM. Gradually advance to progressive resistive exercises. Pain Assessment Currently in Pain?: No/denies (no shoulder pain, but 6/10 in her hips at rest.)  Precautions/Restrictions     Exercise/Treatments Supine Protraction: PROM;5 reps;Strengthening;10 reps;Weights Protraction Weight (lbs): 1 Horizontal ABduction: PROM;5 reps;Strengthening;10 reps;Weights Horizontal ABduction Weight (lbs): 1 External Rotation: PROM;5 reps;Strengthening;10 reps;Weights External Rotation Weight (lbs): 1 Internal Rotation: PROM;5 reps;Strengthening;10 reps;Weights Internal Rotation Weight (lbs): 1 Flexion: PROM;5 reps;Strengthening;10 reps;Weights Shoulder Flexion Weight (lbs): 1 ABduction: PROM;5 reps;Right;10 reps;Weights Shoulder ABduction Weight (lbs): 1 Seated Protraction: AROM;15 reps Horizontal ABduction: AROM;15 reps External Rotation: AROM;15 reps Internal Rotation: AROM;15 reps Flexion: AROM;15 reps Abduction: AROM;15 reps Standing External  Rotation: Theraband;15 reps Theraband Level (Shoulder External Rotation): Level 2 (Red) Internal Rotation: Theraband;15 reps Theraband Level (Shoulder Internal Rotation): Level 2 (Red) Extension: Theraband;15 reps Theraband Level (Shoulder Extension): Level 2 (Red) Row: Theraband;15 reps Theraband Level (Shoulder Row): Level 2 (Red) Retraction: Theraband;15 reps Theraband Level (Shoulder Retraction): Level 2 (Red) ROM / Strengthening / Isometric Strengthening UBE (Upper Arm Bike): 3' forward and 3' reverse at 2.0 Wall Wash: 30' wit no weight, 1 min with 1/2 lb weight "W" Arms: 15X X to V Arms: 15X Proximal Shoulder Strengthening, Supine: 10 times each with 1# - up/down, crisscorss, right/left circles Proximal Shoulder Strengthening, Seated: 12 times with no rest breaks, in standing     Occupational Therapy Assessment and Plan OT Assessment and Plan Clinical Impression Statement: Pt with increased pain in lower back and hips this session - going to MD this PM to determine what is going on.  Completed seated dercises leaning back into chair rather than sitting straight, to decrease pain/discomfort.  Added 1# to supine exercises - pt fatigued, but had good toelrance. OT Plan: Reassess for MD appointment on 12th.  Upgrade to green theraband.   Goals Short Term Goals Short Term Goal 1: Pt will be educated on HEP Short Term Goal 1 Progress: Met Short Term Goal 2: Pt will achieved right shoulder PROM WFL to facilitate reaching into a washing machine Short Term Goal 2 Progress: Met Short Term Goal 3: Pt will have pain levels of <5/10. Short Term Goal 3 Progress: Met Short Term Goal 4: Pt will have min fascial restrictions in her right bicep, upper arm, and upper trap regions. Short Term Goal 4 Progress: Progressing toward goal Short Term Goal 5: Patient will improve  strength to 3+/5 in right arm for increased ability to lift bags of groceries.  Short Term Goal 5 Progress: Progressing  toward goal Long Term Goals Long Term Goal 1:  Pt will ahcieve highest level of functioning in all ADLs, IADLs, work, and leisure activities. Long Term Goal 1 Progress: Progressing toward goal Long Term Goal 3: Pt will have pain of less than 2/10. Long Term Goal 3 Progress: Met Long Term Goal 4: Pt will have trace fascial restrictions. Long Term Goal 4 Progress: Progressing toward goal Long Term Goal 5: Pt will have ability to draw and administer her mother's insulin shot with no complaints of pain or difficulty, 5/5 times. Long Term Goal 5 Progress: Discontinued (comment) Long Term Goal 6: Patient will increase Rt hand strength to 50# to increase the ability to open jars and containers.  Long Term Goal 6 Progress: Progressing toward goal Long Term Goal 7: Patient will have 5/5 strength in her right arm in order to lift bags of dogfood overhead.  Long Term Goal 7 Progress: Progressing toward goal Long Term Goal 8: Pt will be able to shoot her handguns with no complaint of pain. Long Term Goal 8 Progress: Progressing toward goal  Problem List Patient Active Problem List   Diagnosis Date Noted  . Pain in joint, shoulder region 11/22/2013  . Decreased range of motion of right shoulder 11/22/2013  . Muscle tightness 11/22/2013  . Muscle weakness (generalized) 11/22/2013  . Low back pain 04/28/2011  . Post laminectomy syndrome 04/28/2011  . DYSPHAGIA 09/26/2009  . DIARRHEA 09/26/2009  . FULL INCONTINENCE OF FECES 09/26/2009    End of Session Activity Tolerance: Patient tolerated treatment well General Behavior During Therapy: [facility] for tasks assessed/performed  GO    Bea Graff, [place], OTR/L [phone]  01/22/2014, 11:02 AM

## 2014-01-24 ENCOUNTER — Ambulatory Visit (INDEPENDENT_AMBULATORY_CARE_PROVIDER_SITE_OTHER): Payer: Medicare Other | Admitting: Gastroenterology

## 2014-01-24 ENCOUNTER — Ambulatory Visit (HOSPITAL_COMMUNITY): Payer: Medicare Other

## 2014-01-24 ENCOUNTER — Encounter: Payer: Self-pay | Admitting: Gastroenterology

## 2014-01-24 VITALS — BP 155/95 | HR 96 | Temp 97.3°F | Ht 65.5 in | Wt 199.0 lb

## 2014-01-24 DIAGNOSIS — R197 Diarrhea, unspecified: Secondary | ICD-10-CM | POA: Diagnosis not present

## 2014-01-24 MED ORDER — CHOLESTYRAMINE 4 GM/DOSE PO POWD: 4.0000 g | Freq: Every day | ORAL | Status: DC

## 2014-01-24 NOTE — Assessment & Plan Note (Signed)
68 year old with chronic diarrhea, managed with Questran each evening. Doing well from a GI standpoint. Next colonoscopy in 2021 unless indicated otherwise. Return in 2 years or sooner if needed.

## 2014-01-24 NOTE — Patient Instructions (Signed)
Continue to take 1 scoop of Questran each evening.   We will see you again in May 2017 unless something changes.   Next colonoscopy in 2021!

## 2014-01-24 NOTE — Progress Notes (Signed)
Referring Provider: Elsie Lincoln, MD Primary Care Physician:  Leonides Grills, MD Primary GI: Dr. Gala Romney   Chief Complaint  Patient presents with  . Medication Refill    HPI:   Victoria Mccarthy presents today in f/u with a hx of chronic dysphagia, GERD, diarrhea. Multiple dilations in past. Most recent in July 2011. She is here for a routine 2 year follow-up. Will rarely have esophageal spasms but relieved with nitroglycerin.    Diarrhea: managed with Questran, 1 scoop each evening. No rectal bleeding. No GI complaints whatsoever.    Last TCS Jan 2011, due for routine screening likely in 2021. I do not see any past history of polyps upon review of EMR.   Past Medical History  Diagnosis Date  . PONV (postoperative nausea and vomiting)   . Arthritis     chronic back pain  . Diarrhea     incontinent stools  . Nocturia   . Urinary frequency   . Anemia   . Depression   . Psychotic affective disorder     "psychotic delusions" "I cut myself"   . Restless leg syndrome   . Rheumatic fever   . MVP (mitral valve prolapse)   . Bipolar disorder   . Osteoporosis   . Osteoarthritis   . Diverticulitis     treated at least 5 times in past, hospitalized twice  . Cancer Jan 2013    kidney cancer, left, s/p partial nephrectomy  . Heart murmur   . GERD (gastroesophageal reflux disease)     esophageal spasms - takes nitroglycerin for this  . Anxiety   . Neuropathy     Past Surgical History  Procedure Laterality Date  . Tonsillectomy    . Abdominal hysterectomy    . Cholecystectomy    . Carpell tunnell      rt wrist  x2  . Nissen fundoplication    . Rotator cuff repair      right side X 2  . Robot assisted laparoscopic nephrectomy  08/23/2011    Procedure: ROBOTIC ASSISTED LAPAROSCOPIC NEPHRECTOMY;  Surgeon: Dutch Gray, MD;  Location: [facility];  Service: Urology;  Laterality: Left;  Left Robotic Assisted  Laparoscopic Partial Nephrectomy   . Esophagogastroduodenoscopy  04/06/2010      intact Nissen fundoplication S/P dilation, somewhat baggy atonic appearing esophagus, 58-F dilation  . Esophagogastroduodenoscopy  1/11    nomral esophagus s/p 54-F Maloney dilation, normal/intact Nissen fundoplication, diffuse patchy erythema and erosions likely NSAID/ASA effect with benign biopsy  . Colonoscopy  5/02    pancolonic deverticula, internal hemorrhoids  . Colonoscopy  1/11    single external hemorrhoidal tag and anal papilla otherwise normal rectum, pancolonic diverticula, s/p sigmoid biopsy and stool sampling all unremarkable  . Back surgery    . Kidney surgery for cancer    . Back surgery      2 plates, 8 screws in back    Current Outpatient Prescriptions  Medication Sig Dispense Refill  . ALPRAZolam (XANAX) 0.5 MG tablet Take 0.5 mg by mouth at bedtime as needed for anxiety.      . cholestyramine (QUESTRAN) 4 GM/DOSE powder Take 4 g by mouth every evening. Do not take within two hours of other meds.      . mirabegron ER (MYRBETRIQ) 50 MG TB24 tablet Take 50 mg by mouth daily.      . Multiple Vitamin (MULTIVITAMIN WITH MINERALS) TABS Take 1 tablet by mouth every evening.      . nitroGLYCERIN (NITROSTAT)  0.4 MG SL tablet Place 0.4 mg under the tongue every 5 (five) minutes as needed for chest pain.      Marland Kitchen OLANZapine-FLUoxetine (SYMBYAX) 6-50 MG per capsule Take 1 capsule by mouth at bedtime.      Marland Kitchen rOPINIRole (REQUIP) 0.5 MG tablet Take 2 mg by mouth at bedtime.       No current facility-administered medications for this visit.    Allergies as of 01/24/2014 - Review Complete 01/24/2014  Allergen Reaction Noted  . Shellfish allergy Anaphylaxis 08/11/2011  . Neurontin [gabapentin]  02/28/2013  . Codeine Itching 09/26/2009  . Haloperidol lactate Other (See Comments) 09/26/2009  . Hydromorphone hcl Nausea And Vomiting   . Latex Rash 08/11/2011  . Propoxyphene n-acetaminophen Nausea And Vomiting 09/26/2009    Family History  Problem Relation Age of Onset  . Colon  cancer Neg Hx     History   Social History  . Marital Status: Divorced    Spouse Name: N/A    Number of Children: N/A  . Years of Education: N/A   Social History Main Topics  . Smoking status: Never Smoker   . Smokeless tobacco: None  . Alcohol Use: No  . Drug Use: No  . Sexual Activity: None   Other Topics Concern  . None   Social History Narrative  . None    Review of Systems: As mentioned in HPI.   Physical Exam: BP 155/95  Pulse 96  Temp(Src) 97.3 F (36.3 C) (Oral)  Ht 5' 5.5" (1.664 m)  Wt 199 lb (90.266 kg)  BMI 32.60 kg/m2 General:   Alert and oriented. No distress noted. Pleasant and cooperative.  Head:  Normocephalic and atraumatic. Eyes:  Conjuctiva clear without scleral icterus. Heart:  S1, S2 present without murmurs, rubs, or gallops. Regular rate and rhythm. Abdomen:  +BS, soft, non-tender and non-distended. No rebound or guarding. No HSM or masses noted. Msk:  Symmetrical without gross deformities. Normal posture. Extremities:  Without edema. Neurologic:  Alert and  oriented x4;  grossly normal neurologically. Skin:  Intact without significant lesions or rashes. Psych:  Alert and cooperative. Normal mood and affect.

## 2014-01-28 NOTE — Progress Notes (Signed)
cc'd to pcp 

## 2014-01-31 ENCOUNTER — Ambulatory Visit (HOSPITAL_COMMUNITY)
Admission: RE | Admit: 2014-01-31 | Discharge: 2014-01-31 | Disposition: A | Discharge status: Home / Self Care | Payer: Medicare Other | Source: Ambulatory Visit | Attending: Family Medicine | Admitting: Family Medicine

## 2014-01-31 NOTE — Evaluation (Addendum)
Occupational Therapy Reassessment and Discharge Summary  Patient Details  Name: Victoria Mccarthy MRN: [id number] Date of Birth: 03-01-1946  Today's Date: 01/31/2014 Time: 2878-6767 OT Time Calculation (min): 32 min Therex 2094-7096 10' Reassess 1115-1137 22'  Visit#: 18 of 36  Re-eval: 01/29/14  Assessment Diagnosis: Right Rotator Cuff Surgery  Authorization: Medicare  Authorization Time Period: before 21st visit  Authorization Visit#: 68 of 21   Past Medical History:  Past Medical History  Diagnosis Date  . PONV (postoperative nausea and vomiting)   . Arthritis     chronic back pain  . Diarrhea     incontinent stools  . Nocturia   . Urinary frequency   . Anemia   . Depression   . Psychotic affective disorder     "psychotic delusions" "I cut myself"   . Restless leg syndrome   . Rheumatic fever   . MVP (mitral valve prolapse)   . Bipolar disorder   . Osteoporosis   . Osteoarthritis   . Diverticulitis     treated at least 5 times in past, hospitalized twice  . Cancer Jan 2013    kidney cancer, left, s/p partial nephrectomy  . Heart murmur   . GERD (gastroesophageal reflux disease)     esophageal spasms - takes nitroglycerin for this  . Anxiety   . Neuropathy    Past Surgical History:  Past Surgical History  Procedure Laterality Date  . Tonsillectomy    . Abdominal hysterectomy    . Cholecystectomy    . Carpell tunnell      rt wrist  x2  . Nissen fundoplication    . Rotator cuff repair      right side X 2  . Robot assisted laparoscopic nephrectomy  08/23/2011    Procedure: ROBOTIC ASSISTED LAPAROSCOPIC NEPHRECTOMY;  Surgeon: Dutch Gray, MD;  Location: [facility];  Service: Urology;  Laterality: Left;  Left Robotic Assisted  Laparoscopic Partial Nephrectomy   . Esophagogastroduodenoscopy  04/06/2010    intact Nissen fundoplication S/P dilation, somewhat baggy atonic appearing esophagus, 58-F dilation  . Esophagogastroduodenoscopy  1/11    nomral esophagus s/p  54-F Maloney dilation, normal/intact Nissen fundoplication, diffuse patchy erythema and erosions likely NSAID/ASA effect with benign biopsy  . Colonoscopy  5/02    pancolonic deverticula, internal hemorrhoids  . Colonoscopy  1/11    single external hemorrhoidal tag and anal papilla otherwise normal rectum, pancolonic diverticula, s/p sigmoid biopsy and stool sampling all unremarkable  . Back surgery    . Kidney surgery for cancer    . Back surgery      2 plates, 8 screws in back    Subjective Symptoms/Limitations Symptoms: S: I'm still having problems with my back and hips. My shoulder feels wonderful though.  Pain Assessment Currently in Pain?: No/denies  Precautions/Restrictions  Precautions Precautions: Shoulder Precaution Comments: [facility] protocol (scanned)   Assessment Additional Assessments RUE AROM (degrees) RUE Overall AROM Comments: Assess in supine/seated, with ER/IR adducted Right Shoulder Flexion:  (163/180 last progress note: 155 (supine)) Right Shoulder ABduction:  (175/170 last progress note: 156 (supine)) Right Shoulder Internal Rotation:  (90/90 last progress note: 70 (supine)) Right Shoulder External Rotation:  (80/80 last progress note: 25 (supine)) RUE Strength RUE Overall Strength Comments: Assessed in sitting Right Shoulder Flexion:  (4-/5 (last progress note: 3/5)) Right Shoulder ABduction:  (4-/5 (last progress note: 3/5)) Right Shoulder Internal Rotation: 4/5 (same at last progress note) Right Shoulder External Rotation: 4/5 (last progress note: 3-/5) Palpation Palpation: Trace  fascial restrictions in RUE     Exercise/Treatments Standing Horizontal ABduction: Theraband;10 reps Theraband Level (Shoulder Horizontal ABduction): Level 2 (Red) External Rotation: Theraband;10 reps Theraband Level (Shoulder External Rotation): Level 2 (Red) Internal Rotation: Theraband;10 reps Theraband Level (Shoulder Internal Rotation): Level 2  (Red) Flexion: Theraband;10 reps Theraband Level (Shoulder Flexion): Level 2 (Red) ABduction: Theraband;10 reps Theraband Level (Shoulder ABduction): Level 2 (Red) Extension: Theraband;10 reps Theraband Level (Shoulder Extension): Level 2 (Red)        Occupational Therapy Assessment and Plan OT Assessment and Plan Clinical Impression Statement: A: Reassessment and discharge completed this date. patient met 5/5 STGs and 6/7 LTGs. Patient was given an updated HEP and was agreeable to discharge.  OT Plan: P: D/C from therapy.   Goals Short Term Goals Time to Complete Short Term Goals: 6 weeks Short Term Goal 1: Pt will be educated on HEP Short Term Goal 2: Pt will achieved right shoulder PROM WFL to facilitate reaching into a washing machine Short Term Goal 3: Pt will have pain levels of <5/10. Short Term Goal 4: Pt will have min fascial restrictions in her right bicep, upper arm, and upper trap regions. Short Term Goal 4 Progress: Met Short Term Goal 5: Patient will improve  strength to 3+/5 in right arm for increased ability to lift bags of groceries.  Short Term Goal 5 Progress: Met Long Term Goals Time to Complete Long Term Goals: 8 weeks Long Term Goal 1: Pt will ahcieve highest level of functioning in all ADLs, IADLs, work, and leisure activities. Long Term Goal 1 Progress: Met Long Term Goal 2: Pt will achieve right shoulder AROM to WNL in order to hang laundry to dry. Long Term Goal 2 Progress: Met Long Term Goal 3: Pt will have pain of less than 2/10. Long Term Goal 3 Progress: Met Long Term Goal 4: Pt will have trace fascial restrictions. Long Term Goal 4 Progress: Met Long Term Goal 5: Pt will have ability to draw and administer her mother's insulin shot with no complaints of pain or difficulty, 5/5 times. Long Term Goal 6: Patient will increase Rt hand strength to 50# to increase the ability to open jars and containers.  Long Term Goal 6 Progress: Met Long Term Goal 7:  Patient will have 5/5 strength in her right arm in order to lift bags of dogfood overhead.  Long Term Goal 7 Progress: Not met Long Term Goal 8: Pt will be able to shoot her handguns with no complaint of pain. Long Term Goal 8 Progress: Met  Problem List Patient Active Problem List   Diagnosis Date Noted  . Pain in joint, shoulder region 11/22/2013  . Decreased range of motion of right shoulder 11/22/2013  . Muscle tightness 11/22/2013  . Muscle weakness (generalized) 11/22/2013  . Low back pain 04/28/2011  . Post laminectomy syndrome 04/28/2011  . DYSPHAGIA 09/26/2009  . DIARRHEA 09/26/2009  . FULL INCONTINENCE OF FECES 09/26/2009    End of Session Activity Tolerance: Patient tolerated treatment well General Behavior During Therapy: WFL for tasks assessed/performed OT Plan of Care OT Home Exercise Plan: red theraband exercises OT Patient Instructions: handout (scanned) and demonstration Consulted and Agree with Plan of Care: Patient  GO Functional Assessment Tool Used: FOTO score: 73/100 (last progress note: 65/100) Functional Limitation: Carrying, moving and handling objects Carrying, Moving and Handling Objects Current Status ([id number]): At least 20 percent but less than 40 percent impaired, limited or restricted Carrying, Moving and Handling Objects Goal Status [phone]):  At least 1 percent but less than 20 percent impaired, limited or restricted Carrying, Moving and Handling Objects Discharge Status [phone]): At least 20 percent but less than 40 percent impaired, limited or restricted  Ailene Ravel, OTR/L,CBIS   01/31/2014, 11:52 AM  Physician Documentation Your signature is required to indicate approval of the treatment plan as stated above.  Please sign and either send electronically or make a copy of this report for your files and return this physician signed original.  Please mark one 1.__approve of plan  2. ___approve of plan with the following  conditions.   ______________________________                                                          _____________________ Physician Signature                                                                                                             Date

## 2014-02-01 ENCOUNTER — Ambulatory Visit (HOSPITAL_COMMUNITY)
Admission: RE | Admit: 2014-02-01 | Discharge: 2014-02-01 | Disposition: A | Discharge status: Home / Self Care | Payer: Medicare Other | Source: Ambulatory Visit | Attending: Family Medicine | Admitting: Family Medicine

## 2014-02-01 DIAGNOSIS — Z1231 Encounter for screening mammogram for malignant neoplasm of breast: Secondary | ICD-10-CM | POA: Insufficient documentation

## 2014-02-01 DIAGNOSIS — Z780 Asymptomatic menopausal state: Secondary | ICD-10-CM | POA: Insufficient documentation

## 2014-02-01 DIAGNOSIS — M899 Disorder of bone, unspecified: Secondary | ICD-10-CM | POA: Diagnosis not present

## 2014-02-01 DIAGNOSIS — M949 Disorder of cartilage, unspecified: Secondary | ICD-10-CM | POA: Diagnosis not present

## 2014-02-07 DIAGNOSIS — M545 Low back pain, unspecified: Secondary | ICD-10-CM | POA: Diagnosis not present

## 2014-02-07 DIAGNOSIS — M5137 Other intervertebral disc degeneration, lumbosacral region: Secondary | ICD-10-CM | POA: Diagnosis not present

## 2014-02-07 DIAGNOSIS — Z9889 Other specified postprocedural states: Secondary | ICD-10-CM | POA: Diagnosis not present

## 2014-02-25 ENCOUNTER — Telehealth: Payer: Self-pay | Admitting: *Deleted

## 2014-02-25 MED ORDER — CHOLESTYRAMINE 4 GM/DOSE PO POWD: 4.0000 g | Freq: Every day | ORAL | Status: DC

## 2014-02-25 NOTE — Telephone Encounter (Signed)
Pt needs refill on questran but she needs it in 90 day supply.

## 2014-02-25 NOTE — Telephone Encounter (Signed)
[facility] has faxed [place] 5 refill requests since Friday. I have put them on your desk to look at.

## 2014-02-25 NOTE — Telephone Encounter (Signed)
I just sent in the same thing Victoria Mccarthy gave last month I think. When you calculate 4 grams once daily for 90 days, the 378g can IS a 90 day supply.

## 2014-02-27 NOTE — Telephone Encounter (Signed)
Done

## 2014-03-19 DIAGNOSIS — H2510 Age-related nuclear cataract, unspecified eye: Secondary | ICD-10-CM | POA: Diagnosis not present

## 2014-03-19 DIAGNOSIS — Z961 Presence of intraocular lens: Secondary | ICD-10-CM | POA: Diagnosis not present

## 2014-03-19 DIAGNOSIS — H43819 Vitreous degeneration, unspecified eye: Secondary | ICD-10-CM | POA: Diagnosis not present

## 2014-03-26 DIAGNOSIS — M5137 Other intervertebral disc degeneration, lumbosacral region: Secondary | ICD-10-CM | POA: Diagnosis not present

## 2014-03-26 DIAGNOSIS — Z9889 Other specified postprocedural states: Secondary | ICD-10-CM | POA: Diagnosis not present

## 2014-04-12 DIAGNOSIS — Z981 Arthrodesis status: Secondary | ICD-10-CM | POA: Diagnosis not present

## 2014-04-12 DIAGNOSIS — Z9889 Other specified postprocedural states: Secondary | ICD-10-CM | POA: Diagnosis not present

## 2014-04-12 DIAGNOSIS — M545 Low back pain, unspecified: Secondary | ICD-10-CM | POA: Diagnosis not present

## 2014-04-12 DIAGNOSIS — M961 Postlaminectomy syndrome, not elsewhere classified: Secondary | ICD-10-CM | POA: Diagnosis not present

## 2014-04-30 DIAGNOSIS — M5137 Other intervertebral disc degeneration, lumbosacral region: Secondary | ICD-10-CM | POA: Diagnosis not present

## 2014-04-30 DIAGNOSIS — M545 Low back pain, unspecified: Secondary | ICD-10-CM | POA: Diagnosis not present

## 2014-04-30 DIAGNOSIS — Z9889 Other specified postprocedural states: Secondary | ICD-10-CM | POA: Diagnosis not present

## 2014-04-30 DIAGNOSIS — G2581 Restless legs syndrome: Secondary | ICD-10-CM | POA: Diagnosis not present

## 2014-05-02 DIAGNOSIS — F411 Generalized anxiety disorder: Secondary | ICD-10-CM | POA: Diagnosis not present

## 2014-05-02 DIAGNOSIS — F314 Bipolar disorder, current episode depressed, severe, without psychotic features: Secondary | ICD-10-CM | POA: Diagnosis not present

## 2014-05-11 DIAGNOSIS — Z23 Encounter for immunization: Secondary | ICD-10-CM | POA: Diagnosis not present

## 2014-06-14 DIAGNOSIS — M545 Low back pain, unspecified: Secondary | ICD-10-CM | POA: Diagnosis not present

## 2014-07-05 DIAGNOSIS — M4186 Other forms of scoliosis, lumbar region: Secondary | ICD-10-CM | POA: Diagnosis not present

## 2014-07-05 DIAGNOSIS — M47816 Spondylosis without myelopathy or radiculopathy, lumbar region: Secondary | ICD-10-CM | POA: Diagnosis not present

## 2014-07-05 DIAGNOSIS — M5136 Other intervertebral disc degeneration, lumbar region: Secondary | ICD-10-CM | POA: Diagnosis not present

## 2014-07-05 DIAGNOSIS — M545 Low back pain: Secondary | ICD-10-CM | POA: Diagnosis not present

## 2014-07-05 DIAGNOSIS — M4806 Spinal stenosis, lumbar region: Secondary | ICD-10-CM | POA: Diagnosis not present

## 2014-07-08 ENCOUNTER — Other Ambulatory Visit: Payer: Self-pay | Admitting: Neurological Surgery

## 2014-07-08 DIAGNOSIS — M4806 Spinal stenosis, lumbar region: Secondary | ICD-10-CM | POA: Diagnosis not present

## 2014-07-08 DIAGNOSIS — R030 Elevated blood-pressure reading, without diagnosis of hypertension: Secondary | ICD-10-CM | POA: Diagnosis not present

## 2014-07-08 DIAGNOSIS — Z6831 Body mass index (BMI) 31.0-31.9, adult: Secondary | ICD-10-CM | POA: Diagnosis not present

## 2014-07-09 DIAGNOSIS — N289 Disorder of kidney and ureter, unspecified: Secondary | ICD-10-CM | POA: Diagnosis not present

## 2014-07-11 ENCOUNTER — Encounter (HOSPITAL_COMMUNITY): Payer: Self-pay | Admitting: Pharmacy Technician

## 2014-07-12 DIAGNOSIS — N289 Disorder of kidney and ureter, unspecified: Secondary | ICD-10-CM | POA: Diagnosis not present

## 2014-07-12 DIAGNOSIS — K760 Fatty (change of) liver, not elsewhere classified: Secondary | ICD-10-CM | POA: Diagnosis not present

## 2014-07-18 ENCOUNTER — Encounter (HOSPITAL_COMMUNITY)
Admission: RE | Admit: 2014-07-18 | Discharge: 2014-07-18 | Disposition: A | Discharge status: Home / Self Care | Payer: Medicare Other | Source: Ambulatory Visit | Attending: Neurological Surgery | Admitting: Neurological Surgery

## 2014-07-18 ENCOUNTER — Encounter (HOSPITAL_COMMUNITY): Payer: Self-pay

## 2014-07-18 DIAGNOSIS — K224 Dyskinesia of esophagus: Secondary | ICD-10-CM | POA: Diagnosis not present

## 2014-07-18 DIAGNOSIS — G8929 Other chronic pain: Secondary | ICD-10-CM | POA: Insufficient documentation

## 2014-07-18 DIAGNOSIS — I058 Other rheumatic mitral valve diseases: Secondary | ICD-10-CM | POA: Diagnosis not present

## 2014-07-18 DIAGNOSIS — M4800 Spinal stenosis, site unspecified: Secondary | ICD-10-CM | POA: Diagnosis not present

## 2014-07-18 DIAGNOSIS — M549 Dorsalgia, unspecified: Secondary | ICD-10-CM | POA: Diagnosis not present

## 2014-07-18 DIAGNOSIS — M48061 Spinal stenosis, lumbar region without neurogenic claudication: Secondary | ICD-10-CM

## 2014-07-18 DIAGNOSIS — Z01818 Encounter for other preprocedural examination: Secondary | ICD-10-CM | POA: Insufficient documentation

## 2014-07-18 LAB — PROTIME-INR
INR: 0.98 (ref 0.00–1.49)
Prothrombin Time: 13.1 seconds (ref 11.6–15.2)

## 2014-07-18 LAB — CBC WITH DIFFERENTIAL/PLATELET
Basophils Absolute: 0 10*3/uL (ref 0.0–0.1)
Basophils Relative: 0 % (ref 0–1)
Eosinophils Absolute: 0 10*3/uL (ref 0.0–0.7)
Eosinophils Relative: 0 % (ref 0–5)
HCT: 38.5 % (ref 36.0–46.0)
Hemoglobin: 12.9 g/dL (ref 12.0–15.0)
LYMPHS ABS: 1.5 10*3/uL (ref 0.7–4.0)
Lymphocytes Relative: 37 % (ref 12–46)
MCH: 30.4 pg (ref 26.0–34.0)
MCHC: 33.5 g/dL (ref 30.0–36.0)
MCV: 90.6 fL (ref 78.0–100.0)
MONOS PCT: 10 % (ref 3–12)
Monocytes Absolute: 0.4 10*3/uL (ref 0.1–1.0)
NEUTROS PCT: 53 % (ref 43–77)
Neutro Abs: 2.1 10*3/uL (ref 1.7–7.7)
Platelets: 206 10*3/uL (ref 150–400)
RBC: 4.25 MIL/uL (ref 3.87–5.11)
RDW: 13 % (ref 11.5–15.5)
WBC: 4.1 10*3/uL (ref 4.0–10.5)

## 2014-07-18 LAB — SURGICAL PCR SCREEN
MRSA, PCR: NEGATIVE
Staphylococcus aureus: POSITIVE — AB

## 2014-07-18 LAB — BASIC METABOLIC PANEL
Anion gap: 15 (ref 5–15)
BUN: 11 mg/dL (ref 6–23)
CHLORIDE: 102 meq/L (ref 96–112)
CO2: 23 meq/L (ref 19–32)
Calcium: 9.2 mg/dL (ref 8.4–10.5)
Creatinine, Ser: 0.81 mg/dL (ref 0.50–1.10)
GFR calc non Af Amer: 74 mL/min — ABNORMAL LOW (ref >90)
GFR, EST AFRICAN AMERICAN: 86 mL/min — AB (ref >90)
Glucose, Bld: 119 mg/dL — ABNORMAL HIGH (ref 70–99)
POTASSIUM: 4 meq/L (ref 3.7–5.3)
Sodium: 140 mEq/L (ref 137–147)

## 2014-07-18 LAB — TYPE AND SCREEN
ABO/RH(D): O POS
ANTIBODY SCREEN: NEGATIVE

## 2014-07-18 NOTE — Pre-Procedure Instructions (Signed)
Victoria Mccarthy  07/18/2014   Your procedure is scheduled on:  Thursday, Nov. 5th   Report to [facility] Admitting at  5:30 AM.   Call this number if you have problems the morning of surgery: [phone]   Remember:   Do not eat food or drink liquids after midnight Wednesday.   Take these medicines the morning of surgery with A SIP OF WATER: Xanax   Do not wear jewelry, make-up or nail polish.  Do not wear lotions, powders, or perfumes. You may NOT wear deodorant the morning of surgery.  Do not shave underarms & legs 48 hours prior to surgery.    Do not bring valuables to the hospital.  [facility] is not responsible for any belongings or valuables.               Contacts, dentures or bridgework may not be worn into surgery.  Leave suitcase in the car. After surgery it may be brought to your room.  For patients admitted to the hospital, discharge time is determined by your treatment team.    Name and phone number of your driver:    Special Instructions: "Preparing for Surgery" instruction sheet.   Please read over the following fact sheets that you were given: Pain Booklet, Coughing and Deep Breathing, MRSA Information and Surgical Site Infection Prevention

## 2014-07-18 NOTE — Progress Notes (Signed)
Patient has history of MVP d/t rheumatic fever.  Had it checked out "yrs ago" "everything is OK"  Has not had any chest discomfort, and has not been to see a cardio in yrs.   She takes nitro for her esophageal spasms.  Nissen Cleopatra Cedar was done d/t her 'bad heartburn"

## 2014-07-24 MED ORDER — DEXAMETHASONE SODIUM PHOSPHATE 10 MG/ML IJ SOLN
10.0000 mg | INTRAMUSCULAR | Status: AC
  Administered 2014-07-25: 10 mg via INTRAVENOUS
  Filled 2014-07-24: qty 1

## 2014-07-24 MED ORDER — CEFAZOLIN SODIUM-DEXTROSE 2-3 GM-% IV SOLR
2.0000 g | INTRAVENOUS | Status: AC
  Administered 2014-07-25: 2 g via INTRAVENOUS
  Filled 2014-07-24: qty 50

## 2014-07-25 ENCOUNTER — Inpatient Hospital Stay (HOSPITAL_COMMUNITY): Payer: Medicare Other

## 2014-07-25 ENCOUNTER — Encounter (HOSPITAL_COMMUNITY)
Admission: RE | Disposition: A | Discharge status: Home / Self Care | Payer: Self-pay | Source: Ambulatory Visit | Attending: Neurological Surgery

## 2014-07-25 ENCOUNTER — Encounter (HOSPITAL_COMMUNITY): Payer: Self-pay | Admitting: Certified Registered Nurse Anesthetist

## 2014-07-25 ENCOUNTER — Inpatient Hospital Stay (HOSPITAL_COMMUNITY): Payer: Medicare Other | Admitting: Certified Registered Nurse Anesthetist

## 2014-07-25 ENCOUNTER — Inpatient Hospital Stay (HOSPITAL_COMMUNITY)
Admission: RE | Admit: 2014-07-25 | Discharge: 2014-07-27 | DRG: 460 | Disposition: A | Discharge status: Home / Self Care | Payer: Medicare Other | Source: Ambulatory Visit | Attending: Neurological Surgery | Admitting: Neurological Surgery

## 2014-07-25 DIAGNOSIS — R197 Diarrhea, unspecified: Secondary | ICD-10-CM | POA: Diagnosis present

## 2014-07-25 DIAGNOSIS — M4326 Fusion of spine, lumbar region: Secondary | ICD-10-CM | POA: Diagnosis not present

## 2014-07-25 DIAGNOSIS — Z85528 Personal history of other malignant neoplasm of kidney: Secondary | ICD-10-CM

## 2014-07-25 DIAGNOSIS — F329 Major depressive disorder, single episode, unspecified: Secondary | ICD-10-CM | POA: Diagnosis present

## 2014-07-25 DIAGNOSIS — Z981 Arthrodesis status: Secondary | ICD-10-CM | POA: Diagnosis not present

## 2014-07-25 DIAGNOSIS — M4806 Spinal stenosis, lumbar region: Secondary | ICD-10-CM | POA: Diagnosis present

## 2014-07-25 DIAGNOSIS — G2581 Restless legs syndrome: Secondary | ICD-10-CM | POA: Diagnosis present

## 2014-07-25 DIAGNOSIS — Z79899 Other long term (current) drug therapy: Secondary | ICD-10-CM | POA: Diagnosis not present

## 2014-07-25 DIAGNOSIS — Z472 Encounter for removal of internal fixation device: Secondary | ICD-10-CM | POA: Diagnosis present

## 2014-07-25 DIAGNOSIS — M5126 Other intervertebral disc displacement, lumbar region: Principal | ICD-10-CM | POA: Diagnosis present

## 2014-07-25 DIAGNOSIS — R350 Frequency of micturition: Secondary | ICD-10-CM | POA: Diagnosis present

## 2014-07-25 DIAGNOSIS — M1990 Unspecified osteoarthritis, unspecified site: Secondary | ICD-10-CM | POA: Diagnosis not present

## 2014-07-25 DIAGNOSIS — M48061 Spinal stenosis, lumbar region without neurogenic claudication: Secondary | ICD-10-CM

## 2014-07-25 DIAGNOSIS — M545 Low back pain: Secondary | ICD-10-CM | POA: Diagnosis not present

## 2014-07-25 DIAGNOSIS — D649 Anemia, unspecified: Secondary | ICD-10-CM | POA: Diagnosis not present

## 2014-07-25 DIAGNOSIS — K219 Gastro-esophageal reflux disease without esophagitis: Secondary | ICD-10-CM | POA: Diagnosis not present

## 2014-07-25 HISTORY — PX: MAXIMUM ACCESS (MAS)POSTERIOR LUMBAR INTERBODY FUSION (PLIF) 1 LEVEL: SHX6368

## 2014-07-25 SURGERY — FOR MAXIMUM ACCESS (MAS) POSTERIOR LUMBAR INTERBODY FUSION (PLIF) 1 LEVEL
Anesthesia: General | Site: Back

## 2014-07-25 MED ORDER — LIDOCAINE HCL 4 % MT SOLN
OROMUCOSAL | Status: DC | PRN
  Administered 2014-07-25: 4 mL via TOPICAL

## 2014-07-25 MED ORDER — SODIUM CHLORIDE 0.9 % IR SOLN
Status: DC | PRN
  Administered 2014-07-25: 09:00:00

## 2014-07-25 MED ORDER — LIDOCAINE HCL (CARDIAC) 20 MG/ML IV SOLN
INTRAVENOUS | Status: AC
  Filled 2014-07-25: qty 5

## 2014-07-25 MED ORDER — OLANZAPINE-FLUOXETINE HCL 6-50 MG PO CAPS
2.0000 | ORAL_CAPSULE | Freq: Every day | ORAL | Status: DC
  Filled 2014-07-25 (×2): qty 2

## 2014-07-25 MED ORDER — THROMBIN 5000 UNITS EX SOLR
OROMUCOSAL | Status: DC | PRN
  Administered 2014-07-25: 09:00:00 via TOPICAL

## 2014-07-25 MED ORDER — ONDANSETRON HCL 4 MG/2ML IJ SOLN
INTRAMUSCULAR | Status: DC | PRN
  Administered 2014-07-25: 4 mg via INTRAVENOUS

## 2014-07-25 MED ORDER — PHENOL 1.4 % MT LIQD: 1.0000 | OROMUCOSAL | Status: DC | PRN

## 2014-07-25 MED ORDER — HYDROMORPHONE HCL 1 MG/ML IJ SOLN
0.2500 mg | INTRAMUSCULAR | Status: DC | PRN
  Administered 2014-07-25 (×2): 0.5 mg via INTRAVENOUS

## 2014-07-25 MED ORDER — METHOCARBAMOL 500 MG PO TABS
ORAL_TABLET | ORAL | Status: AC
  Filled 2014-07-25: qty 1

## 2014-07-25 MED ORDER — THROMBIN 20000 UNITS EX SOLR
CUTANEOUS | Status: DC | PRN
  Administered 2014-07-25: 09:00:00 via TOPICAL

## 2014-07-25 MED ORDER — SODIUM CHLORIDE 0.9 % IJ SOLN
3.0000 mL | Freq: Two times a day (BID) | INTRAMUSCULAR | Status: DC
  Administered 2014-07-25 – 2014-07-26 (×3): 3 mL via INTRAVENOUS

## 2014-07-25 MED ORDER — OXYCODONE-ACETAMINOPHEN 5-325 MG PO TABS
1.0000 | ORAL_TABLET | ORAL | Status: DC | PRN
  Administered 2014-07-25 – 2014-07-27 (×10): 2 via ORAL
  Filled 2014-07-25 (×9): qty 2

## 2014-07-25 MED ORDER — ROCURONIUM BROMIDE 50 MG/5ML IV SOLN
INTRAVENOUS | Status: AC
  Filled 2014-07-25: qty 1

## 2014-07-25 MED ORDER — PROPOFOL 10 MG/ML IV BOLUS
INTRAVENOUS | Status: DC | PRN
  Administered 2014-07-25: 160 mg via INTRAVENOUS

## 2014-07-25 MED ORDER — 0.9 % SODIUM CHLORIDE (POUR BTL) OPTIME
TOPICAL | Status: DC | PRN
  Administered 2014-07-25: 1000 mL

## 2014-07-25 MED ORDER — FENTANYL CITRATE 0.05 MG/ML IJ SOLN
INTRAMUSCULAR | Status: AC
  Filled 2014-07-25: qty 5

## 2014-07-25 MED ORDER — PROPOFOL 10 MG/ML IV BOLUS
INTRAVENOUS | Status: AC
  Filled 2014-07-25: qty 20

## 2014-07-25 MED ORDER — DEXTROSE 5 % IV SOLN
10.0000 mg | INTRAVENOUS | Status: DC | PRN
  Administered 2014-07-25: 15 ug/min via INTRAVENOUS

## 2014-07-25 MED ORDER — MENTHOL 3 MG MT LOZG: 1.0000 | LOZENGE | OROMUCOSAL | Status: DC | PRN

## 2014-07-25 MED ORDER — ONDANSETRON HCL 4 MG/2ML IJ SOLN
INTRAMUSCULAR | Status: AC
  Filled 2014-07-25: qty 2

## 2014-07-25 MED ORDER — OXYCODONE-ACETAMINOPHEN 5-325 MG PO TABS
ORAL_TABLET | ORAL | Status: AC
  Filled 2014-07-25: qty 2

## 2014-07-25 MED ORDER — POTASSIUM CHLORIDE IN NACL 20-0.9 MEQ/L-% IV SOLN
INTRAVENOUS | Status: DC
  Filled 2014-07-25 (×5): qty 1000

## 2014-07-25 MED ORDER — MIRABEGRON ER 50 MG PO TB24
50.0000 mg | ORAL_TABLET | Freq: Every day | ORAL | Status: DC
  Administered 2014-07-25 – 2014-07-27 (×3): 50 mg via ORAL
  Filled 2014-07-25 (×3): qty 1

## 2014-07-25 MED ORDER — SODIUM CHLORIDE 0.9 % IJ SOLN: 3.0000 mL | INTRAMUSCULAR | Status: DC | PRN

## 2014-07-25 MED ORDER — CELECOXIB 200 MG PO CAPS
200.0000 mg | ORAL_CAPSULE | Freq: Two times a day (BID) | ORAL | Status: DC
  Administered 2014-07-25 – 2014-07-27 (×5): 200 mg via ORAL
  Filled 2014-07-25 (×6): qty 1

## 2014-07-25 MED ORDER — MORPHINE SULFATE 2 MG/ML IJ SOLN
1.0000 mg | INTRAMUSCULAR | Status: DC | PRN
  Administered 2014-07-25 (×3): 4 mg via INTRAVENOUS
  Filled 2014-07-25 (×3): qty 2

## 2014-07-25 MED ORDER — ROPINIROLE HCL 1 MG PO TABS
1.5000 mg | ORAL_TABLET | Freq: Every day | ORAL | Status: DC
  Administered 2014-07-25 – 2014-07-26 (×2): 1.5 mg via ORAL
  Filled 2014-07-25 (×3): qty 1

## 2014-07-25 MED ORDER — DEXAMETHASONE 4 MG PO TABS
4.0000 mg | ORAL_TABLET | Freq: Four times a day (QID) | ORAL | Status: DC
  Administered 2014-07-25 – 2014-07-27 (×8): 4 mg via ORAL
  Filled 2014-07-25 (×12): qty 1

## 2014-07-25 MED ORDER — DEXAMETHASONE SODIUM PHOSPHATE 4 MG/ML IJ SOLN
4.0000 mg | Freq: Four times a day (QID) | INTRAMUSCULAR | Status: DC
  Filled 2014-07-25 (×8): qty 1

## 2014-07-25 MED ORDER — ACETAMINOPHEN 325 MG PO TABS: 650.0000 mg | ORAL_TABLET | ORAL | Status: DC | PRN

## 2014-07-25 MED ORDER — SUCCINYLCHOLINE CHLORIDE 20 MG/ML IJ SOLN
INTRAMUSCULAR | Status: DC | PRN
  Administered 2014-07-25: 100 mg via INTRAVENOUS

## 2014-07-25 MED ORDER — MIDAZOLAM HCL 2 MG/2ML IJ SOLN
INTRAMUSCULAR | Status: AC
  Filled 2014-07-25: qty 2

## 2014-07-25 MED ORDER — OLANZAPINE-FLUOXETINE HCL 3-25 MG PO CAPS
4.0000 | ORAL_CAPSULE | Freq: Every day | ORAL | Status: DC
  Administered 2014-07-25 – 2014-07-26 (×2): 4 via ORAL
  Filled 2014-07-25 (×3): qty 4

## 2014-07-25 MED ORDER — CEFAZOLIN SODIUM 1-5 GM-% IV SOLN
1.0000 g | Freq: Three times a day (TID) | INTRAVENOUS | Status: AC
  Administered 2014-07-25 (×2): 1 g via INTRAVENOUS
  Filled 2014-07-25 (×2): qty 50

## 2014-07-25 MED ORDER — ACETAMINOPHEN 650 MG RE SUPP: 650.0000 mg | RECTAL | Status: DC | PRN

## 2014-07-25 MED ORDER — ALPRAZOLAM 0.5 MG PO TABS
0.5000 mg | ORAL_TABLET | Freq: Four times a day (QID) | ORAL | Status: DC | PRN
  Administered 2014-07-26: 0.5 mg via ORAL
  Filled 2014-07-25: qty 1

## 2014-07-25 MED ORDER — HYDROMORPHONE HCL 1 MG/ML IJ SOLN
INTRAMUSCULAR | Status: AC
  Filled 2014-07-25: qty 1

## 2014-07-25 MED ORDER — HYDROMORPHONE HCL 1 MG/ML IJ SOLN: 0.2500 mg | INTRAMUSCULAR | Status: DC | PRN

## 2014-07-25 MED ORDER — BUPIVACAINE HCL (PF) 0.25 % IJ SOLN
INTRAMUSCULAR | Status: DC | PRN
  Administered 2014-07-25: 4 mL

## 2014-07-25 MED ORDER — EPHEDRINE SULFATE 50 MG/ML IJ SOLN
INTRAMUSCULAR | Status: DC | PRN
  Administered 2014-07-25: 5 mg via INTRAVENOUS
  Administered 2014-07-25: 10 mg via INTRAVENOUS

## 2014-07-25 MED ORDER — FENTANYL CITRATE 0.05 MG/ML IJ SOLN
INTRAMUSCULAR | Status: DC | PRN
  Administered 2014-07-25 (×2): 50 ug via INTRAVENOUS
  Administered 2014-07-25: 100 ug via INTRAVENOUS
  Administered 2014-07-25 (×3): 50 ug via INTRAVENOUS

## 2014-07-25 MED ORDER — NITROGLYCERIN 0.4 MG SL SUBL: 0.4000 mg | SUBLINGUAL_TABLET | SUBLINGUAL | Status: DC | PRN

## 2014-07-25 MED ORDER — METHOCARBAMOL 500 MG PO TABS
500.0000 mg | ORAL_TABLET | Freq: Four times a day (QID) | ORAL | Status: DC | PRN
  Administered 2014-07-25 – 2014-07-27 (×6): 500 mg via ORAL
  Filled 2014-07-25 (×5): qty 1

## 2014-07-25 MED ORDER — LIDOCAINE HCL (CARDIAC) 20 MG/ML IV SOLN
INTRAVENOUS | Status: DC | PRN
  Administered 2014-07-25: 100 mg via INTRAVENOUS

## 2014-07-25 MED ORDER — ONDANSETRON HCL 4 MG/2ML IJ SOLN: 4.0000 mg | INTRAMUSCULAR | Status: DC | PRN

## 2014-07-25 MED ORDER — ONDANSETRON HCL 4 MG/2ML IJ SOLN: 4.0000 mg | Freq: Once | INTRAMUSCULAR | Status: DC | PRN

## 2014-07-25 MED ORDER — PROPOFOL INFUSION 10 MG/ML OPTIME
INTRAVENOUS | Status: DC | PRN
  Administered 2014-07-25: 50 ug/kg/min via INTRAVENOUS

## 2014-07-25 MED ORDER — DEXTROSE 5 % IV SOLN
500.0000 mg | Freq: Four times a day (QID) | INTRAVENOUS | Status: DC | PRN
  Filled 2014-07-25: qty 5

## 2014-07-25 MED ORDER — LACTATED RINGERS IV SOLN
INTRAVENOUS | Status: DC | PRN
  Administered 2014-07-25 (×2): via INTRAVENOUS

## 2014-07-25 SURGICAL SUPPLY — 62 items
APL SKNCLS STERI-STRIP NONHPOA (GAUZE/BANDAGES/DRESSINGS) ×1
BAG DECANTER FOR FLEXI CONT (MISCELLANEOUS) ×2 IMPLANT
BENZOIN TINCTURE PRP APPL 2/3 (GAUZE/BANDAGES/DRESSINGS) ×2 IMPLANT
BLADE CLIPPER SURG (BLADE) IMPLANT
BONE MATRIX OSTEOCEL PRO SM (Bone Implant) ×1 IMPLANT
BUR MATCHSTICK NEURO 3.0 LAGG (BURR) ×2 IMPLANT
CAGE COROENT MP 8X9X23M-8 SPIN (Cage) ×2 IMPLANT
CANISTER SUCT 3000ML (MISCELLANEOUS) ×2 IMPLANT
CLIP NEUROVISION LG (CLIP) ×1 IMPLANT
CONT SPEC 4OZ CLIKSEAL STRL BL (MISCELLANEOUS) ×4 IMPLANT
COVER BACK TABLE 24X17X13 BIG (DRAPES) IMPLANT
COVER BACK TABLE 60X90IN (DRAPES) ×2 IMPLANT
DRAPE C-ARM 42X72 X-RAY (DRAPES) ×2 IMPLANT
DRAPE C-ARMOR (DRAPES) ×2 IMPLANT
DRAPE LAPAROTOMY 100X72X124 (DRAPES) ×2 IMPLANT
DRAPE POUCH INSTRU U-SHP 10X18 (DRAPES) ×2 IMPLANT
DRAPE SURG 17X23 STRL (DRAPES) ×2 IMPLANT
DRSG OPSITE 4X5.5 SM (GAUZE/BANDAGES/DRESSINGS) ×2 IMPLANT
DRSG OPSITE POSTOP 4X6 (GAUZE/BANDAGES/DRESSINGS) ×1 IMPLANT
DRSG TELFA 3X8 NADH (GAUZE/BANDAGES/DRESSINGS) ×2 IMPLANT
DURAPREP 26ML APPLICATOR (WOUND CARE) ×2 IMPLANT
ELECT REM PT RETURN 9FT ADLT (ELECTROSURGICAL) ×2
ELECTRODE REM PT RTRN 9FT ADLT (ELECTROSURGICAL) ×1 IMPLANT
EVACUATOR 1/8 PVC DRAIN (DRAIN) ×2 IMPLANT
GAUZE SPONGE 4X4 16PLY XRAY LF (GAUZE/BANDAGES/DRESSINGS) IMPLANT
GLOVE BIO SURGEON STRL SZ8 (GLOVE) ×4 IMPLANT
GOWN STRL REUS W/ TWL LRG LVL3 (GOWN DISPOSABLE) IMPLANT
GOWN STRL REUS W/ TWL XL LVL3 (GOWN DISPOSABLE) ×2 IMPLANT
GOWN STRL REUS W/TWL 2XL LVL3 (GOWN DISPOSABLE) IMPLANT
GOWN STRL REUS W/TWL LRG LVL3 (GOWN DISPOSABLE)
GOWN STRL REUS W/TWL XL LVL3 (GOWN DISPOSABLE) ×4
HEMOSTAT POWDER KIT SURGIFOAM (HEMOSTASIS) IMPLANT
KIT BASIN OR (CUSTOM PROCEDURE TRAY) ×2 IMPLANT
KIT NDL NVM5 EMG ELECT (KITS) IMPLANT
KIT NEEDLE NVM5 EMG ELECT (KITS) ×1 IMPLANT
KIT NEEDLE NVM5 EMG ELECTRODE (KITS) ×1
KIT ROOM TURNOVER OR (KITS) ×2 IMPLANT
MILL MEDIUM DISP (BLADE) ×1 IMPLANT
NDL HYPO 25X1 1.5 SAFETY (NEEDLE) ×1 IMPLANT
NEEDLE HYPO 25X1 1.5 SAFETY (NEEDLE) ×2 IMPLANT
NS IRRIG 1000ML POUR BTL (IV SOLUTION) ×2 IMPLANT
PACK LAMINECTOMY NEURO (CUSTOM PROCEDURE TRAY) ×2 IMPLANT
PAD ARMBOARD 7.5X6 YLW CONV (MISCELLANEOUS) ×6 IMPLANT
PAD DRESSING TELFA 3X8 NADH (GAUZE/BANDAGES/DRESSINGS) ×1 IMPLANT
ROD PREBENT MAS PLIF 75MM (Rod) ×2 IMPLANT
SCREW LOCK (Screw) ×12 IMPLANT
SCREW LOCK FXNS SPNE MAS PL (Screw) IMPLANT
SCREW SHANK 5.0X30MM (Screw) ×2 IMPLANT
SCREW TULIP 5.5 (Screw) ×2 IMPLANT
SPONGE LAP 4X18 X RAY DECT (DISPOSABLE) IMPLANT
SPONGE SURGIFOAM ABS GEL 100 (HEMOSTASIS) ×2 IMPLANT
STRIP CLOSURE SKIN 1/2X4 (GAUZE/BANDAGES/DRESSINGS) ×3 IMPLANT
SUT VIC AB 0 CT1 18XCR BRD8 (SUTURE) ×1 IMPLANT
SUT VIC AB 0 CT1 8-18 (SUTURE) ×2
SUT VIC AB 2-0 CP2 18 (SUTURE) ×2 IMPLANT
SUT VIC AB 3-0 SH 8-18 (SUTURE) ×4 IMPLANT
SYR 20ML ECCENTRIC (SYRINGE) ×2 IMPLANT
SYR 3ML LL SCALE MARK (SYRINGE) IMPLANT
TOWEL OR 17X24 6PK STRL BLUE (TOWEL DISPOSABLE) ×2 IMPLANT
TOWEL OR 17X26 10 PK STRL BLUE (TOWEL DISPOSABLE) ×2 IMPLANT
TRAY FOLEY CATH 14FRSI W/METER (CATHETERS) ×2 IMPLANT
WATER STERILE IRR 1000ML POUR (IV SOLUTION) ×2 IMPLANT

## 2014-07-25 NOTE — Progress Notes (Signed)
Utilization review completed.  

## 2014-07-25 NOTE — Plan of Care (Signed)
Problem: Consults Goal: Diagnosis - Spinal Surgery Outcome: Completed/Met Date Met:  07/25/14 MAS PLIF L2-3 AND REMOVAL OF HARDWARE L3-4

## 2014-07-25 NOTE — H&P (Signed)
Subjective: Patient is a 68 y.o. female admitted for PLIF. Onset of symptoms was a few weeks ago, gradually worsening since that time.  The pain is rated intense, and is located at the across the lower back and radiates to leg. The pain is described as aching and occurs all day. The symptoms have been progressive. Symptoms are exacerbated by exercise. MRI or CT showed adjacent level HNP with stenosis. Previous PLIF   Past Medical History  Diagnosis Date  . PONV (postoperative nausea and vomiting)   . Arthritis     chronic back pain  . Diarrhea     incontinent stools  . Nocturia   . Urinary frequency   . Anemia   . Depression   . Psychotic affective disorder     "psychotic delusions" "I cut myself"   . Restless leg syndrome   . Rheumatic fever   . MVP (mitral valve prolapse)   . Bipolar disorder   . Osteoporosis   . Osteoarthritis   . Diverticulitis     treated at least 5 times in past, hospitalized twice  . Cancer Jan 2013    kidney cancer, left, s/p partial nephrectomy  . Heart murmur   . GERD (gastroesophageal reflux disease)     esophageal spasms - takes nitroglycerin for this  . Anxiety   . Neuropathy     Past Surgical History  Procedure Laterality Date  . Tonsillectomy    . Abdominal hysterectomy    . Cholecystectomy    . Carpell tunnell      rt wrist  x2  . Nissen fundoplication    . Rotator cuff repair      right side X 2  . Robot assisted laparoscopic nephrectomy  08/23/2011    Procedure: ROBOTIC ASSISTED LAPAROSCOPIC NEPHRECTOMY;  Surgeon: Dutch Gray, MD;  Location: [facility];  Service: Urology;  Laterality: Left;  Left Robotic Assisted  Laparoscopic Partial Nephrectomy   . Esophagogastroduodenoscopy  04/06/2010    intact Nissen fundoplication S/P dilation, somewhat baggy atonic appearing esophagus, 58-F dilation  . Esophagogastroduodenoscopy  1/11    nomral esophagus s/p 54-F Maloney dilation, normal/intact Nissen fundoplication, diffuse patchy erythema and  erosions likely NSAID/ASA effect with benign biopsy  . Colonoscopy  5/02    pancolonic deverticula, internal hemorrhoids  . Colonoscopy  1/11    single external hemorrhoidal tag and anal papilla otherwise normal rectum, pancolonic diverticula, s/p sigmoid biopsy and stool sampling all unremarkable  . Back surgery    . Kidney surgery for cancer    . Back surgery      2 plates, 8 screws in back  . Eye surgery      right eye cataract    Prior to Admission medications   Medication Sig Start Date End Date Taking? Authorizing Provider  ALPRAZolam Duanne Moron) 0.5 MG tablet Take 0.5 mg by mouth 4 (four) times daily as needed for anxiety.    Yes Historical Provider, MD  Cholestyramine (QUESTRAN PO) Take 1 scoop by mouth daily.   Yes Historical Provider, MD  methocarbamol (ROBAXIN) 500 MG tablet Take 500 mg by mouth 4 (four) times daily.   Yes Historical Provider, MD  mirabegron ER (MYRBETRIQ) 50 MG TB24 tablet Take 50 mg by mouth daily.   Yes Historical Provider, MD  Multiple Vitamin (MULTIVITAMIN WITH MINERALS) TABS Take 1 tablet by mouth every evening.   Yes Historical Provider, MD  nitroGLYCERIN (NITROSTAT) 0.4 MG SL tablet Place 0.4 mg under the tongue every 5 (five) minutes as needed  for chest pain.   Yes Historical Provider, MD  OLANZapine-FLUoxetine (SYMBYAX) 6-50 MG per capsule Take 2 capsules by mouth at bedtime.    Yes Historical Provider, MD  oxyCODONE-acetaminophen (PERCOCET) 10-325 MG per tablet Take 1 tablet by mouth every 4 (four) hours as needed for pain.   Yes Historical Provider, MD  rOPINIRole (REQUIP) 0.5 MG tablet Take 1.5 mg by mouth at bedtime.    Yes Historical Provider, MD   Allergies  Allergen Reactions  . Shellfish Allergy Anaphylaxis  . Neurontin [Gabapentin]     Unable to talk when takes medication  . Vicodin [Hydrocodone-Acetaminophen] Itching  . Codeine Itching     itching  . Haloperidol Lactate Other (See Comments)     muscle spasm  . Hydromorphone Hcl Nausea And  Vomiting  . Latex Rash  . Propoxyphene N-Acetaminophen Nausea And Vomiting    History  Substance Use Topics  . Smoking status: Never Smoker   . Smokeless tobacco: Not on file  . Alcohol Use: No    Family History  Problem Relation Age of Onset  . Colon cancer Neg Hx      Review of Systems  Positive ROS: neg  All other systems have been reviewed and were otherwise negative with the exception of those mentioned in the HPI and as above.  Objective: Vital signs in last 24 hours: Temp:  [98.6 F (37 C)] 98.6 F (37 C) (11/05 0558) Pulse Rate:  [75] 75 (11/05 0558) Resp:  [20] 20 (11/05 0558) BP: (146)/(72) 146/72 mmHg (11/05 0558) SpO2:  [97 %] 97 % (11/05 0558)  General Appearance: Alert, cooperative, no distress, appears stated age Head: Normocephalic, without obvious abnormality, atraumatic Eyes: PERRL, conjunctiva/corneas clear, EOM's intact    Neck: Supple, symmetrical, trachea midline Back: Symmetric, no curvature, ROM normal, no CVA tenderness Lungs:  respirations unlabored Heart: Regular rate and rhythm Abdomen: Soft, non-tender Extremities: Extremities normal, atraumatic, no cyanosis or edema Pulses: 2+ and symmetric all extremities Skin: Skin color, texture, turgor normal, no rashes or lesions  NEUROLOGIC:   Mental status: Alert and oriented x4,  no aphasia, good attention span, fund of knowledge, and memory Motor Exam - grossly normal Sensory Exam - grossly normal Reflexes: 1+ Coordination - grossly normal Gait - grossly normal Balance - grossly normal Cranial Nerves: I: smell Not tested  II: visual acuity  OS: nl    OD: nl  II: visual fields Full to confrontation  II: pupils Equal, round, reactive to light  III,VII: ptosis None  III,IV,VI: extraocular muscles  Full ROM  V: mastication Normal  V: facial light touch sensation  Normal  V,VII: corneal reflex  Present  VII: facial muscle function - upper  Normal  VII: facial muscle function - lower  Normal  VIII: hearing Not tested  IX: soft palate elevation  Normal  IX,X: gag reflex Present  XI: trapezius strength  5/5  XI: sternocleidomastoid strength 5/5  XI: neck flexion strength  5/5  XII: tongue strength  Normal    Data Review Lab Results  Component Value Date   WBC 4.1 07/18/2014   HGB 12.9 07/18/2014   HCT 38.5 07/18/2014   MCV 90.6 07/18/2014   PLT 206 07/18/2014   Lab Results  Component Value Date   NA 140 07/18/2014   K 4.0 07/18/2014   CL 102 07/18/2014   CO2 23 07/18/2014   BUN 11 07/18/2014   CREATININE 0.81 07/18/2014   GLUCOSE 119* 07/18/2014   Lab Results  Component Value  Date   INR 0.98 07/18/2014    Assessment/Plan: Patient admitted for PLIF. Patient has failed a reasonable attempt at conservative therapy.  I explained the condition and procedure to the patient and answered any questions.  Patient wishes to proceed with procedure as planned. Understands risks/ benefits and typical outcomes of procedure.   JONES,DAVID S 07/25/2014 7:07 AM

## 2014-07-25 NOTE — Anesthesia Preprocedure Evaluation (Signed)
Anesthesia Evaluation  Patient identified by MRN, date of birth, ID band Patient awake    Reviewed: Allergy & Precautions, H&P , NPO status , Patient's Chart, lab work & pertinent test results  History of Anesthesia Complications (+) PONV  Airway        Dental   Pulmonary          Cardiovascular + Valvular Problems/Murmurs MVP     Neuro/Psych Anxiety Bipolar Disorder  Neuromuscular disease    GI/Hepatic GERD-  ,  Endo/Other    Renal/GU      Musculoskeletal  (+) Arthritis -,   Abdominal   Peds  Hematology  (+) anemia ,   Anesthesia Other Findings   Reproductive/Obstetrics                             Anesthesia Physical Anesthesia Plan  ASA: III  Anesthesia Plan:    Post-op Pain Management:    Induction: Intravenous  Airway Management Planned: Oral ETT  Additional Equipment:   Intra-op Plan:   Post-operative Plan: Extubation in OR  Informed Consent: I have reviewed the patients History and Physical, chart, labs and discussed the procedure including the risks, benefits and alternatives for the proposed anesthesia with the patient or authorized representative who has indicated his/her understanding and acceptance.     Plan Discussed with: Anesthesiologist, Surgeon and CRNA  Anesthesia Plan Comments:         Anesthesia Quick Evaluation

## 2014-07-25 NOTE — Anesthesia Postprocedure Evaluation (Signed)
  Anesthesia Post-op Note  Patient: Victoria Mccarthy  Procedure(s) Performed: Procedure(s): FOR MAXIMUM ACCESS (MAS) POSTERIOR LUMBAR INTERBODY FUSION (PLIF) Lumbar two/three, Removal of hardware Lumbar three/four (N/A)  Patient Location: PACU  Anesthesia Type:General  Level of Consciousness: awake, oriented, sedated and patient cooperative  Airway and Oxygen Therapy: Patient Spontanous Breathing  Post-op Pain: mild  Post-op Assessment: Post-op Vital signs reviewed, Patient's Cardiovascular Status Stable, Respiratory Function Stable, Patent Airway, No signs of Nausea or vomiting and Pain level controlled  Post-op Vital Signs: stable  Last Vitals:  Filed Vitals:   07/25/14 1100  BP: 131/78  Pulse: 104  Temp:   Resp: 17    Complications: No apparent anesthesia complications

## 2014-07-25 NOTE — Plan of Care (Signed)
Problem: Phase I Progression Outcomes Goal: Pain controlled with appropriate interventions Outcome: Progressing Goal: OOB as tolerated unless otherwise ordered Outcome: Progressing Goal: Log roll for position change Outcome: Progressing Goal: Initial discharge plan identified Outcome: Progressing Goal: PT/OT consults requested Outcome: Progressing Goal: Hemodynamically stable Outcome: Progressing

## 2014-07-25 NOTE — Plan of Care (Signed)
Problem: Consults Goal: Spinal Surgery Patient Education See Patient Education Module for education specifics. Outcome: Completed/Met Date Met:  07/25/14     

## 2014-07-25 NOTE — Evaluation (Signed)
Physical Therapy Evaluation Patient Details Name: Victoria Mccarthy MRN: [id number] DOB: 16-Oct-1945 Today's Date: 07/25/2014   History of Present Illness  pt is a 68 y.o. female s/p PLIF L2L3 with hardware removal   Clinical Impression  Patient is s/p above surgery resulting in the deficits listed below (see PT Problem List). Patient will benefit from skilled PT to increase their independence and safety with mobility (while adhering to their precautions) to allow discharge home with 62 y.o. Mother tomorrow. Pt hesitant to ambulate with RW today, agreeable to attempt tomorrow if unsteadiness continues.       Follow Up Recommendations No PT follow up;Supervision for mobility/OOB    Equipment Recommendations  Other (comment) (TBD)    Recommendations for Other Services OT consult     Precautions / Restrictions Precautions Precautions: Back;Fall Precaution Booklet Issued: Yes (comment) Precaution Comments: reviewed back precautions handout  Required Braces or Orthoses: Spinal Brace Spinal Brace: Lumbar corset;Applied in sitting position Restrictions Weight Bearing Restrictions: No      Mobility  Bed Mobility               General bed mobility comments: verbally reviewed log rolling technique; pt up in bathroom on arrival and returned to chair   Transfers Overall transfer level: Needs assistance Equipment used: Straight cane Transfers: Sit to/from Stand Sit to Stand: Supervision         General transfer comment: cues for hand placement and technique with back precautions  Ambulation/Gait Ambulation/Gait assistance: Min guard Ambulation Distance (Feet): 50 Feet Assistive device: Straight cane Gait Pattern/deviations: Step-through pattern;Shuffle;Decreased stride length;Narrow base of support Gait velocity: guarded; decreased Gait velocity interpretation: Below normal speed for age/gender General Gait Details: pt reaching for bil UE support during mobility but stated  "i just want to try the cane for now"; cues for gt sequencing and safety with directional changes; plan to attempt gt with RW tomorrow   Stairs            Wheelchair Mobility    Modified Rankin (Stroke Patients Only)       Balance Overall balance assessment: Needs assistance;History of Falls Sitting-balance support: No upper extremity supported;Feet supported Sitting balance-Leahy Scale: Fair Sitting balance - Comments: guarded   Standing balance support: During functional activity;Bilateral upper extremity supported Standing balance-Leahy Scale: Poor Standing balance comment: relying on UE support at all times; guarded due to pain                             Pertinent Vitals/Pain Pain Assessment: 0-10 Pain Score: 8  Pain Location: surgical site Pain Descriptors / Indicators: Constant;Sore;Spasm Pain Intervention(s): Limited activity within patient's tolerance;Monitored during session;Repositioned;Patient requesting pain meds-RN notified;Ice applied    Home Living Family/patient expects to be discharged to:: Private residence Living Arrangements: Alone Available Help at Discharge: Family;Available 24 hours/day Type of Home: House Home Access: Stairs to enter Entrance Stairs-Rails: Right Entrance Stairs-Number of Steps: 2 Home Layout: One level Home Equipment: Walker - 4 wheels;Cane - single point;Tub bench;Hand held shower head;Other (comment) (has reacher) Additional Comments: plans to D/C with mother; who is 85 but cane provide supervision (A)     Prior Function Level of Independence: Independent with assistive device(s)         Comments: ambulates with cane      Hand Dominance   Dominant Hand: Right    Extremity/Trunk Assessment   Upper Extremity Assessment: Defer to OT evaluation  Lower Extremity Assessment: Generalized weakness      Cervical / Trunk Assessment: Normal  Communication   Communication: No difficulties   Cognition Arousal/Alertness: Awake/alert Behavior During Therapy: WFL for tasks assessed/performed Overall Cognitive Status: Within Functional Limits for tasks assessed                      General Comments General comments (skin integrity, edema, etc.): reviewed back precautions and safety with back brace    Exercises        Assessment/Plan    PT Assessment Patient needs continued PT services  PT Diagnosis Abnormality of gait;Generalized weakness;Acute pain   PT Problem List Decreased strength;Decreased activity tolerance;Decreased balance;Decreased mobility;Decreased knowledge of precautions;Pain  PT Treatment Interventions DME instruction;Gait training;Stair training;Functional mobility training;Therapeutic activities;Therapeutic exercise;Balance training;Neuromuscular re-education;Patient/family education   PT Goals (Current goals can be found in the Care Plan section) Acute Rehab PT Goals Patient Stated Goal: home tomorrow PT Goal Formulation: With patient Time For Goal Achievement: 07/28/14 Potential to Achieve Goals: Good    Frequency Min 5X/week   Barriers to discharge        Co-evaluation               End of Session Equipment Utilized During Treatment: Gait belt;Back brace Activity Tolerance: Patient limited by pain;Patient limited by fatigue Patient left: in chair;with call bell/phone within reach Nurse Communication: Mobility status;Precautions;Patient requests pain meds         Time: 1941-7408 PT Time Calculation (min): 16 min   Charges:   PT Evaluation $Initial PT Evaluation Tier I: 1 Procedure PT Treatments $Gait Training: 8-22 mins   PT G CodesGustavus Bryant, [place] 07/25/2014, 4:44 PM

## 2014-07-25 NOTE — Transfer of Care (Signed)
Immediate Anesthesia Transfer of Care Note  Patient: Victoria Mccarthy  Procedure(s) Performed: Procedure(s): FOR MAXIMUM ACCESS (MAS) POSTERIOR LUMBAR INTERBODY FUSION (PLIF) Lumbar two/three, Removal of hardware Lumbar three/four (N/A)  Patient Location: PACU  Anesthesia Type:General  Level of Consciousness: awake, alert  and oriented  Airway & Oxygen Therapy: Patient Spontanous Breathing  Post-op Assessment: Report given to PACU RN  Post vital signs: Reviewed and stable  Complications: No apparent anesthesia complications

## 2014-07-25 NOTE — Addendum Note (Signed)
Addendum  created 07/25/14 1126 by Clearnce Sorrel, CRNA   Modules edited: Anesthesia Flowsheet

## 2014-07-25 NOTE — Op Note (Signed)
07/25/2014  10:43 AM  PATIENT:  Victoria Mccarthy  68 y.o. female  PRE-OPERATIVE DIAGNOSIS:  Adjacent level lumbar disc herniation with spinal stenosis, back and leg pain  POST-OPERATIVE DIAGNOSIS:  same  PROCEDURE:   1. Decompressive lumbar laminectomy L2-3 requiring more work than would be required for a simple exposure of the disk for PLIF in order to adequately decompress the neural elements and address the spinal stenosis 2. Posterior lumbar interbody fusion L2-3 using PEEK interbody cages packed with morcellized allograft and autograft 3. Posterior fixation L2-L4 using Nuvasive cortical pedicle screws.  4. Intertransverse arthrodesis L2-3 on the left using morcellized autograft and allograft. 5. Removal of hardware L3-4  SURGEON:  Sherley Bounds, MD  ASSISTANTS: Dr. Alric Seton  ANESTHESIA:  General  EBL: 150 ml  Total I/O In: 1200 [I.V.:1200] Out: 500 [Urine:350; Blood:150]  BLOOD ADMINISTERED:none  DRAINS: none  INDICATION FOR PROCEDURE: this patient underwent a previous L3-4 posterior lumbar interbody fusion. She presented with recurrent pain. MRI showed a large adjacent level disc herniation with a large right-sided free fragment compressing the right L2 and L3 nerve roots. I recommended adjacent level decompression and instrumented fusion. She tried conservative medical management without relief. Patient understood the risks, benefits, and alternatives and potential outcomes and wished to proceed.  PROCEDURE DETAILS:  The patient was brought to the operating room. After induction of generalized endotracheal anesthesia the patient was rolled into the prone position on chest rolls and all pressure points were padded. The patient's lumbar region was cleaned and then prepped with DuraPrep and draped in the usual sterile fashion. Anesthesia was injected and then a dorsal midline incision was made and carried down to the lumbosacral fascia. The fascia was opened and the paraspinous  musculature was taken down in a subperiosteal fashion to expose L2-3 and the previously placed L3-4 hardware. A self-retaining retractor was placed. I removed the locking caps and the rods from the previous instrumentation. Intraoperative fluoroscopy confirmed my level, and I started with placement of the L2 cortical pedicle screws. The pedicle screw entry zones were identified utilizing surface landmarks and  AP and lateral fluoroscopy. I scored the cortex with the high-speed drill and then used the hand drill and EMG monitoring to drill an upward and outward direction into the pedicle. I then tapped line to line, and the tap was also monitored. I then placed a 5-0 x 30 mm cortical pedicle screw into the pedicles of L2 bilaterally. I then turned my attention to the decompression and the spinous process was removed and complete lumbar laminectomies, hemi- facetectomies, and foraminotomies were performed at L2-3. The patient had significant spinal stenosis and a very large right-sided disc herniation with a free fragment extending superior to the disc space and also lateral to the dura. this required more work than would be required for a simple exposure of the disc for posterior lumbar interbody fusion. Much more generous decompression was undertaken in order to adequately decompress the neural elements and address the patient's leg pain. The yellow ligament was removed to expose the underlying dura and nerve roots, and generous foraminotomies were performed to adequately decompress the neural elements. Both the exiting and traversing nerve roots were decompressed on both sides until a coronary dilator passed easily along the nerve roots. Once the decompression was complete, I turned my attention to the posterior lower lumbar interbody fusion. The epidural venous vasculature was coagulated and cut sharply. Disc space was incised and the initial discectomy was performed with pituitary rongeurs.  The disc space was  distracted with sequential distractors to a height of 8 mm. We then used a series of scrapers and shavers to prepare the endplates for fusion. The midline was prepared with Epstein curettes. Once the complete discectomy was finished, we packed an appropriate sized peek interbody cage with local autograft and morcellized allograft, gently retracted the nerve root, and tapped the cage into position at L2-3.  The midline between the cages was packed with morselized autograft and allograft. We then decorticated the transverse processes and laid a mixture of morcellized autograft and allograft out over these to perform intertransverse arthrodesis at L2-3 on the left. We then placed lordotic rods into the multiaxial screw heads of the pedicle screws and locked these in position with the locking caps and anti-torque device. We then checked our construct with AP and lateral fluoroscopy. Irrigated with copious amounts of bacitracin-containing saline solution.  Inspected the nerve roots once again to assure adequate decompression, lined to the dura with Gelfoam, and closed the muscle and the fascia with 0 Vicryl. Closed the subcutaneous tissues with 2-0 Vicryl and subcuticular tissues with 3-0 Vicryl. The skin was closed with benzoin and Steri-Strips. Dressing was then applied, the patient was awakened from general anesthesia and transported to the recovery room in stable condition. At the end of the procedure all sponge, needle and instrument counts were correct.   PLAN OF CARE: Admit to inpatient   PATIENT DISPOSITION:  PACU - hemodynamically stable.   Delay start of Pharmacological VTE agent (>24hrs) due to surgical blood loss or risk of bleeding:  yes

## 2014-07-26 MED ORDER — CHOLESTYRAMINE 4 G PO PACK
4.0000 g | PACK | Freq: Every day | ORAL | Status: DC
  Administered 2014-07-26 – 2014-07-27 (×2): 4 g via ORAL
  Filled 2014-07-26 (×2): qty 1

## 2014-07-26 NOTE — Plan of Care (Signed)
Problem: Phase III Progression Outcomes Goal: Pain controlled on oral analgesia Outcome: Progressing Goal: Activity at appropriate level-compared to baseline (UP IN CHAIR FOR HEMODIALYSIS)  Outcome: Progressing Goal: Demonstrates donning/doffing brace Outcome: Progressing Goal: Demonstrates proper use of assistive devices Outcome: Progressing Goal: Discharge plan remains appropriate-arrangements made Outcome: Progressing

## 2014-07-26 NOTE — Progress Notes (Signed)
Physical Therapy Treatment Patient Details Name: Victoria Mccarthy MRN: [id number] DOB: 1946/03/25 Today's Date: 07/26/2014    History of Present Illness pt is a 68 y.o. female s/p PLIF L2L3 with hardware removal     PT Comments    Pt currently with functional limitations due to pain, decreased strength and decreased mobility. Pt will benefit from skilled PT to increase independence and safety with mobility to allow discharge to home with home health PT. Pt showing improvement with mobility from previous session. Pt able to safely ambulate with RW and Pt stated she felt more comfortable and stable using a RW for ambulation. Pt educated on stairs and reviewed strategies for home discharge such as getting into a car and how to get up on her elevated bed at home. Pt required verbal cuing throughout session for back precautions.    Follow Up Recommendations  Home health PT;Other (comment) (For home set up eval)     Equipment Recommendations  Rolling walker with 5" wheels    Recommendations for Other Services       Precautions / Restrictions Precautions Precautions: Back;Fall Precaution Booklet Issued: Yes (comment) Precaution Comments: reviewed back precautions handout  Required Braces or Orthoses: Spinal Brace Spinal Brace: Lumbar corset;Applied in sitting position Restrictions Weight Bearing Restrictions: No    Mobility  Bed Mobility Overal bed mobility: Needs Assistance Bed Mobility: Sit to Sidelying           General bed mobility comments: Pt requires verbal cuing to maintain back precautions, particuarly regarding twisting.   Transfers Overall transfer level: Needs assistance Equipment used: Rolling walker (2 wheeled) Transfers: Sit to/from Stand Sit to Stand: Supervision         General transfer comment: Adjusted RW to fit patient; cuing for navigating with RW in tight places  Ambulation/Gait Ambulation/Gait assistance: Min guard Ambulation Distance (Feet):  300 Feet Assistive device: Rolling walker (2 wheeled) Gait Pattern/deviations: Decreased stride length;Step-through pattern   Gait velocity interpretation: Below normal speed for age/gender General Gait Details: Pt able to ambulate safely and comfortably using RW compared to [facility]. Pt able to ambulate increased distance than previous sesson with limited fatigue.     Stairs Stairs: Yes Stairs assistance: Min guard Stair Management: With walker Number of Stairs: 3 (Performed two times) General stair comments: Pt educated on negotiating stairs while using a RW. Pt able to ascend 3 stairs going backwards, leading with stronger leg. Pt educated on walker placement againsted back of steps and letting son stabilize walker for her when discharged. Pt edcuated on descending stairs using her weakner leg first and keeping same walker positioning as when ascending. Pt was safe but did have some difficulty and discomfort lifting her legs.    Wheelchair Mobility    Modified Rankin (Stroke Patients Only)       Balance                                    Cognition Arousal/Alertness: Awake/alert Behavior During Therapy: WFL for tasks assessed/performed Overall Cognitive Status: Within Functional Limits for tasks assessed                      Exercises      General Comments General comments (skin integrity, edema, etc.): Reviewed back precautions      Pertinent Vitals/Pain Pain Assessment: 0-10 Pain Score: 6  (with stairs; 4/10 resting or with general ambulation) Pain  Location: Surigcal site Pain Descriptors / Indicators: Constant Pain Intervention(s): Limited activity within patient's tolerance;Monitored during session;Repositioned;Ice applied    Home Living                      Prior Function            PT Goals (current goals can now be found in the care plan section) Progress towards PT goals: Progressing toward goals    Frequency  Min  5X/week    PT Plan Current plan remains appropriate    Co-evaluation             End of Session Equipment Utilized During Treatment: Gait belt Activity Tolerance: Patient tolerated treatment well Patient left: in chair;with call bell/phone within reach;Other (comment) (With ice pack on back for pain management )     Time: 2800-3491 PT Time Calculation (min): 32 min  Charges:  $Gait Training: 8-22 mins $Self Care/Home Management: 8-22                    G CodesJearld Shines, SPT 07/26/2014, 1:54 PM

## 2014-07-26 NOTE — Evaluation (Signed)
Occupational Therapy Evaluation Patient Details Name: Victoria Mccarthy MRN: [id number] DOB: 10/16/1945 Today's Date: 07/26/2014    History of Present Illness pt is a 68 y.o. female s/p PLIF L2L3 with hardware removal    Clinical Impression   Pt. Was ed. On use of AE for ADLs and was able to use effectively. Pt. Will need to purchase hip kit and toileting tongs and will have family purchase. Pt. Was able to perform sit to stand and transfers from bed and toilet using good technique. Pt. Is able to AMB in room at S level with use of walker. Pt. Was ed. On using proper technique for showering with use of AE and using good technique. Pt. States that she is familiar with back precautions from previous sx and does not need continued followup.     Follow Up Recommendations  No OT follow up    Equipment Recommendations  None recommended by OT    Recommendations for Other Services       Precautions / Restrictions Precautions Precautions: Back;Fall Precaution Booklet Issued: Yes (comment) Precaution Comments: reviewed back precautions handout  Required Braces or Orthoses: Spinal Brace Spinal Brace: Lumbar corset;Applied in sitting position Restrictions Weight Bearing Restrictions: No Other Position/Activity Restrictions:  (back)      Mobility Bed Mobility                  Transfers Overall transfer level: Needs assistance     Sit to Stand: Supervision              Balance                                            ADL Overall ADL's : Needs assistance/impaired Eating/Feeding: Independent   Grooming: Wash/dry hands;Wash/dry face;Oral care;Supervision/safety   Upper Body Bathing: Supervision/ safety   Lower Body Bathing: Supervison/ safety;With adaptive equipment;Cueing for back precautions;Sit to/from stand   Upper Body Dressing : Supervision/safety   Lower Body Dressing: Supervision/safety;With adaptive equipment;Cueing for back  precautions   Toilet Transfer: Supervision/safety;Ambulation;Comfort height toilet;Grab bars   Toileting- Clothing Manipulation and Hygiene: Engineer, site Details (indicate cue type and reason):  (demo for pt. proper technique.) Functional mobility during ADLs: Supervision/safety;Rolling walker General ADL Comments:  (Pt. ed. on AE and was able to use effectively. )     Vision                     Perception     Praxis      Pertinent Vitals/Pain Pain Assessment: 0-10 Pain Score: 5  Pain Location:  (back) Pain Descriptors / Indicators: Constant Pain Intervention(s): Monitored during session;Patient requesting pain meds-RN notified     Hand Dominance Right   Extremity/Trunk Assessment Upper Extremity Assessment Upper Extremity Assessment: Overall WFL for tasks assessed           Communication Communication Communication: No difficulties   Cognition Arousal/Alertness: Awake/alert Behavior During Therapy: WFL for tasks assessed/performed Overall Cognitive Status: Within Functional Limits for tasks assessed                     General Comments       Exercises       Shoulder Instructions      Home Living Family/patient expects to be discharged to:: Private residence Living Arrangements: Alone Available Help at Discharge:  Family;Available 24 hours/day Type of Home: House Home Access: Stairs to enter [employer] of Steps: 2 Entrance Stairs-Rails: Right Home Layout: One level     Bathroom Shower/Tub: Tub/shower unit Shower/tub characteristics: Curtain Biochemist, clinical: Handicapped height Bathroom Accessibility: Yes How Accessible: Accessible via walker Home Equipment: [place] - 4 wheels;Cane - single point;Tub bench;Hand held shower head;Other (comment) (has reacher)   Additional Comments:  (Pt. to d/c home with elderly mother with pt. children A.)      Prior Functioning/Environment Level of  Independence: Independent with assistive device(s)        Comments: ambulates with cane     OT Diagnosis:     OT Problem List:     OT Treatment/Interventions:      OT Goals(Current goals can be found in the care plan section) Acute Rehab OT Goals Patient Stated Goal:  (home with family.)  OT Frequency:     Barriers to D/C:            Co-evaluation              End of Session Equipment Utilized During Treatment: Rolling walker;Back brace Nurse Communication: Patient requests pain meds  Activity Tolerance: Patient tolerated treatment well Patient left: in bed;with call bell/phone within reach   Time: 0758-0839 OT Time Calculation (min): 41 min Charges:  OT General Charges $OT Visit: 1 Procedure OT Evaluation $Initial OT Evaluation Tier I: 1 Procedure OT Treatments $Self Care/Home Management : 23-37 mins G-Codes:    Rhyli Depaula 08/03/2014, 8:40 AM

## 2014-07-26 NOTE — Progress Notes (Signed)
Subjective: Patient reports appropriate back pain, no leg pain or NTW  Objective: Vital signs in last 24 hours: Temp:  [98 F (36.7 C)-98.4 F (36.9 C)] 98.4 F (36.9 C) (11/06 0402) Pulse Rate:  [86-107] 86 (11/06 0402) Resp:  [16-24] 16 (11/06 0402) BP: (122-154)/(55-79) 125/55 mmHg (11/06 0402) SpO2:  [93 %-96 %] 93 % (11/06 0402)  Intake/Output from previous day: 11/05 0730 - 11/06 0729 In: 2880 [P.O.:1680; I.V.:1200] Out: 1400 [Urine:1250; Blood:150] Intake/Output this shift:    Neurologic: Grossly normal  Lab Results: Lab Results  Component Value Date   WBC 4.1 07/18/2014   HGB 12.9 07/18/2014   HCT 38.5 07/18/2014   MCV 90.6 07/18/2014   PLT 206 07/18/2014   Lab Results  Component Value Date   INR 0.98 07/18/2014   BMET Lab Results  Component Value Date   NA 140 07/18/2014   K 4.0 07/18/2014   CL 102 07/18/2014   CO2 23 07/18/2014   GLUCOSE 119* 07/18/2014   BUN 11 07/18/2014   CREATININE 0.81 07/18/2014   CALCIUM 9.2 07/18/2014    Studies/Results: Dg Lumbar Spine 2-3 Views  07/25/2014   CLINICAL DATA:  Prior lumbar spine fusion.  EXAM: DG C-ARM 61-120 MIN; LUMBAR SPINE - 2-3 VIEW  TECHNIQUE: Intraoperative spot films of the lumbar spine obtained.  CONTRAST:  None.  FLUOROSCOPY TIME:  0 min 42 seconds.  COMPARISON:  07/25/2014.  FINDINGS: Lumbar vertebra are numbered on AP view with what appears to be lowest lumbar vertebra is L5. This may be a transitional vertebra . Patient appears to be status post L2 through L4 posterior and interbody fusion. Good anatomic alignment.  IMPRESSION: Good anatomic alignment post L2 through L4 posterior and interbody fusion.   Electronically Signed   By: Marcello Moores  Register   On: 07/25/2014 11:08   Dg C-arm 1-60 Min  07/25/2014   CLINICAL DATA:  Prior lumbar spine fusion.  EXAM: DG C-ARM 61-120 MIN; LUMBAR SPINE - 2-3 VIEW  TECHNIQUE: Intraoperative spot films of the lumbar spine obtained.  CONTRAST:  None.  FLUOROSCOPY TIME:   0 min 42 seconds.  COMPARISON:  07/25/2014.  FINDINGS: Lumbar vertebra are numbered on AP view with what appears to be lowest lumbar vertebra is L5. This may be a transitional vertebra . Patient appears to be status post L2 through L4 posterior and interbody fusion. Good anatomic alignment.  IMPRESSION: Good anatomic alignment post L2 through L4 posterior and interbody fusion.   Electronically Signed   By: Marcello Moores  Register   On: 07/25/2014 11:08    Assessment/Plan: Doing ok, continue to mobilize, pain mgmt   LOS: 1 day    Morelia Cassells S 07/26/2014, 7:31 AM

## 2014-07-27 MED ORDER — METHOCARBAMOL 500 MG PO TABS: 500.0000 mg | ORAL_TABLET | Freq: Four times a day (QID) | ORAL | Status: DC

## 2014-07-27 MED ORDER — OXYCODONE-ACETAMINOPHEN 10-325 MG PO TABS: 1.0000 | ORAL_TABLET | ORAL | Status: DC | PRN

## 2014-07-27 NOTE — Progress Notes (Signed)
PT Cancellation Note  Patient Details Name: MICAIAH REMILLARD MRN: [id number] DOB: Jul 07, 1946   Cancelled Treatment:    Reason Eval/Treat Not Completed: Patient declined, no reason specified.  Pt declined need for PT this am stating this is her second surgery and she is ready to D/C to home today.  Will f/u if pt does not D/C.     Rorie Delmore, Thornton Papas 07/27/2014, 9:07 AM

## 2014-07-27 NOTE — Progress Notes (Signed)
Patient alert and oriented, mae's well, voiding adequate amount of urine, swallowing without difficulty, no c/o pain. Patient discharged home with family. Script and discharged instructions given to patient. Patient and family stated understanding of d/c instructions given and has an appointment with MD. Aisha Forever Arechiga RN 

## 2014-07-27 NOTE — Discharge Summary (Signed)
Physician Discharge Summary  Patient ID: Victoria Mccarthy MRN: [id number] DOB/AGE: October 05, 1945 68 y.o.  Admit date: 07/25/2014 Discharge date: 07/27/2014  Admission Diagnoses: adjacent level stenosis    Discharge Diagnoses: same   Discharged Condition: good  Hospital Course: The patient was admitted on 07/25/2014 and taken to the operating room where the patient underwent PLIF L2-3. The patient tolerated the procedure well and was taken to the recovery room and then to the floor in stable condition. The hospital course was routine. There were no complications. The wound remained clean dry and intact. Pt had appropriate back soreness. No complaints of leg pain or new N/T/W. The patient remained afebrile with stable vital signs, and tolerated a regular diet. The patient continued to increase activities, and pain was well controlled with oral pain medications.   Consults: None  Significant Diagnostic Studies:  Results for orders placed or performed during the hospital encounter of 07/18/14  Surgical pcr screen  Result Value Ref Range   MRSA, PCR NEGATIVE NEGATIVE   Staphylococcus aureus POSITIVE (A) NEGATIVE  Basic metabolic panel  Result Value Ref Range   Sodium 140 137 - 147 mEq/L   Potassium 4.0 3.7 - 5.3 mEq/L   Chloride 102 96 - 112 mEq/L   CO2 23 19 - 32 mEq/L   Glucose, Bld 119 (H) 70 - 99 mg/dL   BUN 11 6 - 23 mg/dL   Creatinine, Ser 0.81 0.50 - 1.10 mg/dL   Calcium 9.2 8.4 - 10.5 mg/dL   GFR calc non Af Amer 74 (L) >90 mL/min   GFR calc Af Amer 86 (L) >90 mL/min   Anion gap 15 5 - 15  CBC WITH DIFFERENTIAL  Result Value Ref Range   WBC 4.1 4.0 - 10.5 K/uL   RBC 4.25 3.87 - 5.11 MIL/uL   Hemoglobin 12.9 12.0 - 15.0 g/dL   HCT 38.5 36.0 - 46.0 %   MCV 90.6 78.0 - 100.0 fL   MCH 30.4 26.0 - 34.0 pg   MCHC 33.5 30.0 - 36.0 g/dL   RDW 13.0 11.5 - 15.5 %   Platelets 206 150 - 400 K/uL   Neutrophils Relative % 53 43 - 77 %   Neutro Abs 2.1 1.7 - 7.7 K/uL   Lymphocytes  Relative 37 12 - 46 %   Lymphs Abs 1.5 0.7 - 4.0 K/uL   Monocytes Relative 10 3 - 12 %   Monocytes Absolute 0.4 0.1 - 1.0 K/uL   Eosinophils Relative 0 0 - 5 %   Eosinophils Absolute 0.0 0.0 - 0.7 K/uL   Basophils Relative 0 0 - 1 %   Basophils Absolute 0.0 0.0 - 0.1 K/uL  Protime-INR  Result Value Ref Range   Prothrombin Time 13.1 11.6 - 15.2 seconds   INR 0.98 0.00 - 1.49  Type and screen  Result Value Ref Range   ABO/RH(D) O POS    Antibody Screen NEG    Sample Expiration 08/01/2014     Chest 2 View  07/18/2014   CLINICAL DATA:  Preop for lumbar surgery, chronic back pain, spinal stenosis  EXAM: CHEST  2 VIEW  COMPARISON:  02/28/2013  FINDINGS: Cardiomediastinal silhouette is stable. No acute infiltrate or pleural effusion. No pulmonary edema. Bony thorax is unremarkable.  IMPRESSION: No active cardiopulmonary disease.   Electronically Signed   By: Lahoma Crocker M.D.   On: 07/18/2014 16:47   Dg Lumbar Spine 2-3 Views  07/25/2014   CLINICAL DATA:  Prior lumbar spine fusion.  EXAM: DG C-ARM 61-120 MIN; LUMBAR SPINE - 2-3 VIEW  TECHNIQUE: Intraoperative spot films of the lumbar spine obtained.  CONTRAST:  None.  FLUOROSCOPY TIME:  0 min 42 seconds.  COMPARISON:  07/25/2014.  FINDINGS: Lumbar vertebra are numbered on AP view with what appears to be lowest lumbar vertebra is L5. This may be a transitional vertebra . Patient appears to be status post L2 through L4 posterior and interbody fusion. Good anatomic alignment.  IMPRESSION: Good anatomic alignment post L2 through L4 posterior and interbody fusion.   Electronically Signed   By: Marcello Moores  Register   On: 07/25/2014 11:08   Dg C-arm 1-60 Min  07/25/2014   CLINICAL DATA:  Prior lumbar spine fusion.  EXAM: DG C-ARM 61-120 MIN; LUMBAR SPINE - 2-3 VIEW  TECHNIQUE: Intraoperative spot films of the lumbar spine obtained.  CONTRAST:  None.  FLUOROSCOPY TIME:  0 min 42 seconds.  COMPARISON:  07/25/2014.  FINDINGS: Lumbar vertebra are numbered on AP  view with what appears to be lowest lumbar vertebra is L5. This may be a transitional vertebra . Patient appears to be status post L2 through L4 posterior and interbody fusion. Good anatomic alignment.  IMPRESSION: Good anatomic alignment post L2 through L4 posterior and interbody fusion.   Electronically Signed   By: Marcello Moores  Register   On: 07/25/2014 11:08    Antibiotics:  Anti-infectives    Start     Dose/Rate Route Frequency Ordered Stop   07/25/14 1100  ceFAZolin (ANCEF) IVPB 1 g/50 mL premix     1 g100 mL/hr over 30 Minutes Intravenous Every 8 hours 07/25/14 1046 07/25/14 2244   07/25/14 0830  bacitracin 50,000 Units in sodium chloride irrigation 0.9 % 500 mL irrigation  Status:  Discontinued       As needed 07/25/14 0831 07/25/14 1031   07/25/14 0600  ceFAZolin (ANCEF) IVPB 2 g/50 mL premix     2 g100 mL/hr over 30 Minutes Intravenous On call to O.R. 07/24/14 1341 07/25/14 0755      Discharge Exam: Blood pressure 153/74, pulse 71, temperature 98.3 F (36.8 C), temperature source Oral, resp. rate 18, SpO2 94 %. Neurologic: Grossly normal Incision CDI  Discharge Medications:     Medication List    TAKE these medications        ALPRAZolam 0.5 MG tablet  Commonly known as:  XANAX  Take 0.5 mg by mouth 4 (four) times daily as needed for anxiety.     methocarbamol 500 MG tablet  Commonly known as:  ROBAXIN  Take 1 tablet (500 mg total) by mouth 4 (four) times daily.     multivitamin with minerals Tabs tablet  Take 1 tablet by mouth every evening.     MYRBETRIQ 50 MG Tb24 tablet  Generic drug:  mirabegron ER  Take 50 mg by mouth daily.     nitroGLYCERIN 0.4 MG SL tablet  Commonly known as:  NITROSTAT  Place 0.4 mg under the tongue every 5 (five) minutes as needed for chest pain.     OLANZapine-FLUoxetine 6-50 MG per capsule  Commonly known as:  SYMBYAX  Take 2 capsules by mouth at bedtime.     oxyCODONE-acetaminophen 10-325 MG per tablet  Commonly known as:  PERCOCET   Take 1 tablet by mouth every 4 (four) hours as needed for pain.     QUESTRAN PO  Take 1 scoop by mouth daily.     rOPINIRole 0.5 MG tablet  Commonly known as:  REQUIP  Take  1.5 mg by mouth at bedtime.        Disposition: home   Final Dx: PLIF      Discharge Instructions    Call MD for:  difficulty breathing, headache or visual disturbances    Complete by:  As directed      Call MD for:  persistant nausea and vomiting    Complete by:  As directed      Call MD for:  redness, tenderness, or signs of infection (pain, swelling, redness, odor or green/yellow discharge around incision site)    Complete by:  As directed      Call MD for:  severe uncontrolled pain    Complete by:  As directed      Call MD for:  temperature >100.4    Complete by:  As directed      Diet - low sodium heart healthy    Complete by:  As directed      Discharge instructions    Complete by:  As directed   No strenuous activity, no ending or twisting, may shower, no driving     Increase activity slowly    Complete by:  As directed      Remove dressing in 48 hours    Complete by:  As directed            Follow-up Information    Follow up with Annise Boran S, MD In 2 weeks.   Specialty:  Neurosurgery   Contact information:   [address] [place] [address] [phone]        Signed: Eustace Moore 07/27/2014, 7:48 AM

## 2014-07-30 ENCOUNTER — Encounter (HOSPITAL_COMMUNITY): Payer: Self-pay | Admitting: Neurological Surgery

## 2014-08-29 DIAGNOSIS — F314 Bipolar disorder, current episode depressed, severe, without psychotic features: Secondary | ICD-10-CM | POA: Diagnosis not present

## 2014-09-23 DIAGNOSIS — Z6830 Body mass index (BMI) 30.0-30.9, adult: Secondary | ICD-10-CM | POA: Diagnosis not present

## 2014-09-23 DIAGNOSIS — R030 Elevated blood-pressure reading, without diagnosis of hypertension: Secondary | ICD-10-CM | POA: Diagnosis not present

## 2014-09-23 DIAGNOSIS — Z981 Arthrodesis status: Secondary | ICD-10-CM | POA: Diagnosis not present

## 2014-10-04 DIAGNOSIS — M542 Cervicalgia: Secondary | ICD-10-CM | POA: Diagnosis not present

## 2014-10-04 DIAGNOSIS — M545 Low back pain: Secondary | ICD-10-CM | POA: Diagnosis not present

## 2014-11-12 ENCOUNTER — Encounter (HOSPITAL_COMMUNITY): Payer: Self-pay | Admitting: Psychiatry

## 2014-11-12 ENCOUNTER — Ambulatory Visit (INDEPENDENT_AMBULATORY_CARE_PROVIDER_SITE_OTHER): Payer: Medicare Other | Admitting: Psychiatry

## 2014-11-12 VITALS — BP 135/85 | HR 83 | Ht 65.5 in | Wt 182.0 lb

## 2014-11-12 DIAGNOSIS — F3130 Bipolar disorder, current episode depressed, mild or moderate severity, unspecified: Secondary | ICD-10-CM

## 2014-11-12 MED ORDER — FLUOXETINE HCL 20 MG PO CAPS: ORAL_CAPSULE | ORAL | Status: DC

## 2014-11-12 MED ORDER — ALPRAZOLAM 1 MG PO TABS: 1.0000 mg | ORAL_TABLET | Freq: Four times a day (QID) | ORAL | Status: DC

## 2014-11-12 MED ORDER — OLANZAPINE 10 MG PO TABS: 10.0000 mg | ORAL_TABLET | Freq: Every day | ORAL | Status: DC

## 2014-11-12 NOTE — Progress Notes (Signed)
Psychiatric Assessment Adult  Patient Identification:  Victoria Mccarthy Date of Evaluation:  11/12/2014 Chief Complaint: My bipolar symptoms are coming back History of Chief Complaint:   Chief Complaint  Patient presents with  . Depression  . Manic Behavior  . Anxiety  . Hallucinations  . Establish Care    HPI this patient is a 69 year old divorced white female who lives alone in [place]. She has one son, one daughter, 6 grandchildren 3 great-grandchildren. 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  The patient states that she's had problems with mental illness since her 78s. She was hospitalized at [facility] in [facility] twice in the 1980s. During these times she was depressed extremely anxious and having paranoid delusions that she had killed someone and that the police were after her. At one point she was hearing voices telling her to hurt her self. She finally started seeing Dr. Charmayne Sheer private practitioner in [place] who treated her for years. In the past she's been on Prozac, Symbyax, Xanax, Valium, BuSpar and lithium.  After her psychiatrist retired, she began going to the [facility] but got tired of waiting for hours for recheck visit. She most recently she's been going to [facility]. The psychiatrist there has cut down her medication. She used to be on Symbiax 6/50-2 a day and she was cut down to 4-25, once a day. Since this happened she become extremely anxious and depressed. At the beginning of this year however her insurance would not pay for the medicine so she's been off it entirely and she is getting even worse. The physician also cut her Xanax down from 1 mg 4 times a day to 0.5 mg twice a day and she is exceedingly anxious.  Currently the patient states that she is depressed all the time, very anxious and tearful, irritable and angry. She can't stand to be around people and stays by herself with her dogs. She's trying to  spend some time sewing but it's very hard for her to concentrate. Her sleep is poor and her mind races. She is again having the delusion that she may have killed someone at the police may be coming to get her. She has nightmares about this. It's hard for her to sit still. She does not have any direct thoughts of hurting self or others. She constantly picks at her fingernails. She denies any current auditory or visual hallucinations Review of Systems  Constitutional: Positive for activity change, appetite change and unexpected weight change.  HENT: Negative.   Eyes: Negative.   Respiratory: Negative.   Cardiovascular: Negative.   Gastrointestinal: Positive for abdominal pain.  Endocrine: Negative.   Genitourinary: Negative.   Musculoskeletal: Positive for back pain, arthralgias and neck pain.  Skin: Negative.   Allergic/Immunologic: Negative.   Hematological: Negative.   Psychiatric/Behavioral: Positive for hallucinations, sleep disturbance, dysphoric mood, decreased concentration and agitation. The patient is nervous/anxious.    Physical Exam not done  Depressive Symptoms: depressed mood, anhedonia, insomnia, psychomotor retardation, fatigue, feelings of worthlessness/guilt, difficulty concentrating, hopelessness, anxiety, panic attacks, weight loss, decreased appetite,  (Hypo) Manic Symptoms:   Elevated Mood:  no Irritable Mood:  Yes Grandiosity:  No Distractibility:  Yes Labiality of Mood:  Yes Delusions:  Yes Hallucinations:  No Impulsivity:  No Sexually Inappropriate Behavior:  No Financial Extravagance:  No Flight of Ideas:  No  Anxiety Symptoms: Excessive Worry:  Yes Panic Symptoms:  Yes Agoraphobia:  Yes Obsessive Compulsive: Yes  Symptoms: Picks at nails Specific Phobias:  No Social Anxiety:  Yes  Psychotic Symptoms:  Hallucinations: No None Delusions:  Yes Paranoia:  Yes   Ideas of Reference:  No  PTSD Symptoms: Ever had a traumatic exposure:   No Had a traumatic exposure in the last month:  No Re-experiencing: No None Hypervigilance:  No Hyperarousal: No None Avoidance: No None  Traumatic Brain Injury: Yes Sports Related  Past Psychiatric History: Diagnosis: Bipolar disorder   Hospitalizations: Twice in the 1980s   Outpatient Care: At [facility], mental [place], with private practitioner   Substance Abuse Care: none  Self-Mutilation:none  Suicidal Attempts: none  Violent Behaviors: none   Past Medical History:   Past Medical History  Diagnosis Date  . PONV (postoperative nausea and vomiting)   . Arthritis     chronic back pain  . Diarrhea     incontinent stools  . Nocturia   . Urinary frequency   . Anemia   . Depression   . Psychotic affective disorder     "psychotic delusions" "I cut myself"   . Restless leg syndrome   . Rheumatic fever   . MVP (mitral valve prolapse)   . Bipolar disorder   . Osteoporosis   . Osteoarthritis   . Diverticulitis     treated at least 5 times in past, hospitalized twice  . Cancer Jan 2013    kidney cancer, left, s/p partial nephrectomy  . Heart murmur   . GERD (gastroesophageal reflux disease)     esophageal spasms - takes nitroglycerin for this  . Anxiety   . Neuropathy    History of Loss of Consciousness:  No Seizure History:  No Cardiac History:  No Allergies:   Allergies  Allergen Reactions  . Shellfish Allergy Anaphylaxis  . Neurontin [Gabapentin]     Unable to talk when takes medication  . Vicodin [Hydrocodone-Acetaminophen] Itching  . Codeine Itching     itching  . Haloperidol Lactate Other (See Comments)     muscle spasm  . Hydromorphone Hcl Nausea And Vomiting  . Latex Rash  . Propoxyphene N-Acetaminophen Nausea And Vomiting   Current Medications:  Current Outpatient Prescriptions  Medication Sig Dispense Refill  . ALPRAZolam (XANAX) 1 MG tablet Take 1 tablet (1 mg total) by mouth 4 (four) times daily. 120 tablet 2  . Cholestyramine  (QUESTRAN PO) Take 1 scoop by mouth daily.    Marland Kitchen FLUoxetine (PROZAC) 20 MG capsule Take three in the am 90 capsule 2  . mirabegron ER (MYRBETRIQ) 50 MG TB24 tablet Take 50 mg by mouth daily.    . Multiple Vitamin (MULTIVITAMIN WITH MINERALS) TABS Take 1 tablet by mouth every evening.    . nitroGLYCERIN (NITROSTAT) 0.4 MG SL tablet Place 0.4 mg under the tongue every 5 (five) minutes as needed for chest pain.    Marland Kitchen OLANZapine (ZYPREXA) 10 MG tablet Take 1 tablet (10 mg total) by mouth at bedtime. 30 tablet 2  . rOPINIRole (REQUIP) 0.5 MG tablet Take 1.5 mg by mouth at bedtime.      No current facility-administered medications for this visit.    Previous Psychotropic Medications:  Medication Dose   Prozac, Symbyax, Xanax, Valium, BuSpar, lithium                        Substance Abuse History in the last 12 months: Substance Age of 1st Use Last Use Amount Specific Type  Nicotine  Alcohol      Cannabis      Opiates      Cocaine      Methamphetamines      LSD      Ecstasy      Benzodiazepines      Caffeine      Inhalants      Others:                          Medical Consequences of Substance Abuse: none  Legal Consequences of Substance Abuse: none  Family Consequences of Substance Abuse: none  Blackouts:  No DT's:  No Withdrawal Symptoms:  No None  Social History: Current Place of Residence: [place] of Birth: [place] Family Members: 2 children, several grandchildren Marital Status:  Divorced Children:   Sons: 1  Daughters: 1 Relationships: No friends Education:  Dentist Problems/Performance:  Religious Beliefs/Practices: none History of Abuse: none Pensions consultant; Artist, Catering manager History:  None. Legal History: none Hobbies/Interests: TV, sewing  Family History:   Family History  Problem Relation Age of Onset  . Colon cancer Neg Hx   . Bipolar disorder Daughter      Mental Status Examination/Evaluation: Objective:  Appearance: Neat and Well Groomed  Engineer, water::  Fair  Speech:  Pressured  Volume:  Normal  Mood:  Depressed and anxious   Affect:  Depressed, Flat and Tearful  Thought Process:  Goal Directed  Orientation:  Full (Time, Place, and Person)  Thought Content:  Delusions and Paranoid Ideation  Suicidal Thoughts:  No  Homicidal Thoughts:  No  Judgement:  Fair  Insight:  Fair  Psychomotor Activity:  Restlessness  Akathisia:  No  Handed:  Right  AIMS (if indicated):    Assets:  Communication Skills Desire for Improvement Resilience Social Support Talents/Skills    Laboratory/X-Ray Psychological Evaluation(s)   Labs reviewed in the chart are unremarkable      Assessment:  Axis I: Bipolar, mixed  AXIS I Bipolar, mixed  AXIS II Deferred  AXIS III Past Medical History  Diagnosis Date  . PONV (postoperative nausea and vomiting)   . Arthritis     chronic back pain  . Diarrhea     incontinent stools  . Nocturia   . Urinary frequency   . Anemia   . Depression   . Psychotic affective disorder     "psychotic delusions" "I cut myself"   . Restless leg syndrome   . Rheumatic fever   . MVP (mitral valve prolapse)   . Bipolar disorder   . Osteoporosis   . Osteoarthritis   . Diverticulitis     treated at least 5 times in past, hospitalized twice  . Cancer Jan 2013    kidney cancer, left, s/p partial nephrectomy  . Heart murmur   . GERD (gastroesophageal reflux disease)     esophageal spasms - takes nitroglycerin for this  . Anxiety   . Neuropathy      AXIS IV other psychosocial or environmental problems  AXIS V 41-50 serious symptoms   Treatment Plan/Recommendations:  Plan of Care: Medication management   Laboratory:    Psychotherapy: She declines   Medications: Since the Symbiax is not covered we will prescribe the component of it. She will start Prozac 60 mg every morning and Zyprexa 10 mg daily at bedtime for  her bipolar disorder and delusions. She will start Xanax 1 mg 4 times a day for  anxiety   Routine PRN Medications:  No  Consultations:   Safety Concerns:  She denies thoughts of hurting self or others   Other:  She'll return in 3 weeks or call if her symptoms worsen before that     Levonne Spiller, MD 2/23/20163:44 PM

## 2014-11-26 ENCOUNTER — Ambulatory Visit (INDEPENDENT_AMBULATORY_CARE_PROVIDER_SITE_OTHER): Payer: Medicare Other | Admitting: Urology

## 2014-11-26 DIAGNOSIS — C649 Malignant neoplasm of unspecified kidney, except renal pelvis: Secondary | ICD-10-CM

## 2014-11-26 DIAGNOSIS — N3941 Urge incontinence: Secondary | ICD-10-CM

## 2014-11-26 DIAGNOSIS — R3911 Hesitancy of micturition: Secondary | ICD-10-CM | POA: Diagnosis not present

## 2014-12-03 ENCOUNTER — Encounter (HOSPITAL_COMMUNITY): Payer: Self-pay | Admitting: Psychiatry

## 2014-12-03 ENCOUNTER — Ambulatory Visit (INDEPENDENT_AMBULATORY_CARE_PROVIDER_SITE_OTHER): Payer: Medicare Other | Admitting: Psychiatry

## 2014-12-03 VITALS — BP 137/86 | HR 86 | Ht 65.5 in | Wt 184.2 lb

## 2014-12-03 DIAGNOSIS — F3160 Bipolar disorder, current episode mixed, unspecified: Secondary | ICD-10-CM | POA: Diagnosis not present

## 2014-12-03 DIAGNOSIS — F3130 Bipolar disorder, current episode depressed, mild or moderate severity, unspecified: Secondary | ICD-10-CM

## 2014-12-03 NOTE — Progress Notes (Signed)
Patient ID: Victoria Mccarthy, female   DOB: 1946/05/02, 69 y.o.   MRN: [id number]  Psychiatric Assessment Adult  Patient Identification:  Victoria Mccarthy Date of Evaluation:  12/03/2014 Chief Complaint: My bipolar symptoms are coming back History of Chief Complaint:   Chief Complaint  Patient presents with  . Depression  . Manic Behavior  . Anxiety  . Follow-up    Anxiety Symptoms include decreased concentration and nervous/anxious behavior.     this patient is a 69 year old divorced white female who lives alone in [place]. She has one son, one daughter, 6 grandchildren 3 great-grandchildren. 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  The patient states that she's had problems with mental illness since her 8s. She was hospitalized at [facility] in [facility] twice in the 1980s. During these times she was depressed extremely anxious and having paranoid delusions that she had killed someone and that the police were after her. At one point she was hearing voices telling her to hurt her self. She finally started seeing Dr. Charmayne Sheer private practitioner in [place] who treated her for years. In the past she's been on Prozac, Symbyax, Xanax, Valium, BuSpar and lithium.  After her psychiatrist retired, she began going to the [facility] but got tired of waiting for hours for recheck visit. She most recently she's been going to [place] [facility]. The psychiatrist there has cut down her medication. She used to be on Symbiax 6/50-2 a day and she was cut down to 4-25, once a day. Since this happened she become extremely anxious and depressed. At the beginning of this year however her insurance would not pay for the medicine so she's been off it entirely and she is getting even worse. The physician also cut her Xanax down from 1 mg 4 times a day to 0.5 mg twice a day and she is exceedingly anxious.  Currently the patient states that she is depressed all the  time, very anxious and tearful, irritable and angry. She can't stand to be around people and stays by herself with her dogs. She's trying to spend some time sewing but it's very hard for her to concentrate. Her sleep is poor and her mind races. She is again having the delusion that she may have killed someone at the police may be coming to get her. She has nightmares about this. It's hard for her to sit still. She does not have any direct thoughts of hurting self or others. She constantly picks at her fingernails. She denies any current auditory or visual hallucinations  The patient returns after four-week's. She is now back on Prozac olanzapine and Xanax. She is feeling much better. Her mood is improved and she sleeping well at night. She is back sewing and making clothes and purses. She denies thoughts of wanting to hurt others or herself Review of Systems  Constitutional: Positive for activity change, appetite change and unexpected weight change.  HENT: Negative.   Eyes: Negative.   Respiratory: Negative.   Cardiovascular: Negative.   Gastrointestinal: Positive for abdominal pain.  Endocrine: Negative.   Genitourinary: Negative.   Musculoskeletal: Positive for back pain, arthralgias and neck pain.  Skin: Negative.   Allergic/Immunologic: Negative.   Hematological: Negative.   Psychiatric/Behavioral: Positive for hallucinations, sleep disturbance, dysphoric mood, decreased concentration and agitation. The patient is nervous/anxious.    Physical Exam not done  Depressive Symptoms: depressed mood, anhedonia, insomnia, psychomotor retardation, fatigue, feelings of worthlessness/guilt, difficulty concentrating,  hopelessness, anxiety, panic attacks, weight loss, decreased appetite,  (Hypo) Manic Symptoms:   Elevated Mood:  no Irritable Mood:  Yes Grandiosity:  No Distractibility:  Yes Labiality of Mood:  Yes Delusions:  Yes Hallucinations:  No Impulsivity:  No Sexually  Inappropriate Behavior:  No Financial Extravagance:  No Flight of Ideas:  No  Anxiety Symptoms: Excessive Worry:  Yes Panic Symptoms:  Yes Agoraphobia:  Yes Obsessive Compulsive: Yes  Symptoms: Picks at nails Specific Phobias:  No Social Anxiety:  Yes  Psychotic Symptoms:  Hallucinations: No None Delusions:  Yes Paranoia:  Yes   Ideas of Reference:  No  PTSD Symptoms: Ever had a traumatic exposure:  No Had a traumatic exposure in the last month:  No Re-experiencing: No None Hypervigilance:  No Hyperarousal: No None Avoidance: No None  Traumatic Brain Injury: Yes Sports Related  Past Psychiatric History: Diagnosis: Bipolar disorder   Hospitalizations: Twice in the 1980s   Outpatient Care: At [facility], mental [place], with private practitioner   Substance Abuse Care: none  Self-Mutilation:none  Suicidal Attempts: none  Violent Behaviors: none   Past Medical History:   Past Medical History  Diagnosis Date  . PONV (postoperative nausea and vomiting)   . Arthritis     chronic back pain  . Diarrhea     incontinent stools  . Nocturia   . Urinary frequency   . Anemia   . Depression   . Psychotic affective disorder     "psychotic delusions" "I cut myself"   . Restless leg syndrome   . Rheumatic fever   . MVP (mitral valve prolapse)   . Bipolar disorder   . Osteoporosis   . Osteoarthritis   . Diverticulitis     treated at least 5 times in past, hospitalized twice  . Cancer Jan 2013    kidney cancer, left, s/p partial nephrectomy  . Heart murmur   . GERD (gastroesophageal reflux disease)     esophageal spasms - takes nitroglycerin for this  . Anxiety   . Neuropathy    History of Loss of Consciousness:  No Seizure History:  No Cardiac History:  No Allergies:   Allergies  Allergen Reactions  . Shellfish Allergy Anaphylaxis  . Neurontin [Gabapentin]     Unable to talk when takes medication  . Vicodin [Hydrocodone-Acetaminophen] Itching   . Codeine Itching     itching  . Haloperidol Lactate Other (See Comments)     muscle spasm  . Hydromorphone Hcl Nausea And Vomiting  . Latex Rash  . Propoxyphene N-Acetaminophen Nausea And Vomiting   Current Medications:  Current Outpatient Prescriptions  Medication Sig Dispense Refill  . ALPRAZolam (XANAX) 1 MG tablet Take 1 tablet (1 mg total) by mouth 4 (four) times daily. 120 tablet 2  . Cholestyramine (QUESTRAN PO) Take 1 scoop by mouth daily.    Marland Kitchen FLUoxetine (PROZAC) 20 MG capsule Take three in the am 90 capsule 2  . mirabegron ER (MYRBETRIQ) 50 MG TB24 tablet Take 50 mg by mouth daily.    . Multiple Vitamin (MULTIVITAMIN WITH MINERALS) TABS Take 1 tablet by mouth every evening.    . nitroGLYCERIN (NITROSTAT) 0.4 MG SL tablet Place 0.4 mg under the tongue every 5 (five) minutes as needed for chest pain.    Marland Kitchen OLANZapine (ZYPREXA) 10 MG tablet Take 1 tablet (10 mg total) by mouth at bedtime. 30 tablet 2  . rOPINIRole (REQUIP) 0.5 MG tablet Take 1.5 mg by mouth at bedtime.  No current facility-administered medications for this visit.    Previous Psychotropic Medications:  Medication Dose   Prozac, Symbyax, Xanax, Valium, BuSpar, lithium                        Substance Abuse History in the last 12 months: Substance Age of 1st Use Last Use Amount Specific Type  Nicotine      Alcohol      Cannabis      Opiates      Cocaine      Methamphetamines      LSD      Ecstasy      Benzodiazepines      Caffeine      Inhalants      Others:                          Medical Consequences of Substance Abuse: none  Legal Consequences of Substance Abuse: none  Family Consequences of Substance Abuse: none  Blackouts:  No DT's:  No Withdrawal Symptoms:  No None  Social History: Current Place of Residence: [place] of Birth: [place] Family Members: 2 children, several grandchildren Marital Status:  Divorced Children:   Sons:  1  Daughters: 1 Relationships: No friends Education:  Dentist Problems/Performance:  Religious Beliefs/Practices: none History of Abuse: none Pensions consultant; Artist, Catering manager History:  None. Legal History: none Hobbies/Interests: TV, sewing  Family History:   Family History  Problem Relation Age of Onset  . Colon cancer Neg Hx   . Bipolar disorder Daughter     Mental Status Examination/Evaluation: Objective:  Appearance: Neat and Well Groomed  Engineer, water::  Fair  Speech:  Pressured  Volume:  Normal  Mood:  good   Affect: brighter  Thought Process:  Goal Directed  Orientation:  Full (Time, Place, and Person)  Thought Content:  normal  Suicidal Thoughts:  No  Homicidal Thoughts:  No  Judgement:  Fair  Insight:  Fair  Psychomotor Activity:  Restlessnessmuch improved  Akathisia:  No  Handed:  Right  AIMS (if indicated):    Assets:  Communication Skills Desire for Improvement Resilience Social Support Talents/Skills    Laboratory/X-Ray Psychological Evaluation(s)   Labs reviewed in the chart are unremarkable      Assessment:  Axis I: Bipolar, mixed  AXIS I Bipolar, mixed  AXIS II Deferred  AXIS III Past Medical History  Diagnosis Date  . PONV (postoperative nausea and vomiting)   . Arthritis     chronic back pain  . Diarrhea     incontinent stools  . Nocturia   . Urinary frequency   . Anemia   . Depression   . Psychotic affective disorder     "psychotic delusions" "I cut myself"   . Restless leg syndrome   . Rheumatic fever   . MVP (mitral valve prolapse)   . Bipolar disorder   . Osteoporosis   . Osteoarthritis   . Diverticulitis     treated at least 5 times in past, hospitalized twice  . Cancer Jan 2013    kidney cancer, left, s/p partial nephrectomy  . Heart murmur   . GERD (gastroesophageal reflux disease)     esophageal spasms - takes nitroglycerin for this  . Anxiety   . Neuropathy      AXIS  IV other psychosocial or environmental problems  AXIS V 41-50 serious symptoms   Treatment  Plan/Recommendations:  Plan of Care: Medication management   Laboratory:    Psychotherapy: She agrees to counseling here today     She will continue Prozac 60 mg every morning and Zyprexa 10 mg daily at bedtime for her bipolar disorder and delusions. She will continue Xanax 1 mg 4 times a day for anxiety   Routine PRN Medications:  No  Consultations:   Safety Concerns:  She denies thoughts of hurting self or others   Other:  She'll return in 6 weeks or call if her symptoms worsen before that     Levonne Spiller, MD 3/15/20163:40 PM

## 2014-12-09 ENCOUNTER — Other Ambulatory Visit: Payer: Self-pay

## 2014-12-10 MED ORDER — CHOLESTYRAMINE 4 GM/DOSE PO POWD: 4.0000 g | Freq: Every day | ORAL | Status: DC

## 2014-12-12 ENCOUNTER — Other Ambulatory Visit (HOSPITAL_COMMUNITY): Payer: Self-pay | Admitting: *Deleted

## 2014-12-12 ENCOUNTER — Telehealth (HOSPITAL_COMMUNITY): Payer: Self-pay | Admitting: *Deleted

## 2014-12-12 NOTE — Telephone Encounter (Signed)
Pt pharmacy requesting authorization for 90 days supply for pt Zyprexa.

## 2014-12-12 NOTE — Telephone Encounter (Signed)
You may send that in

## 2014-12-12 NOTE — Telephone Encounter (Signed)
Called and spoke with Harle Battiest R.who will speak with Dr.Ross about approving the request for a 90 day supply. No further action required at this time until MD approval.

## 2014-12-16 ENCOUNTER — Telehealth (HOSPITAL_COMMUNITY): Payer: Self-pay | Admitting: *Deleted

## 2014-12-16 MED ORDER — OLANZAPINE 10 MG PO TABS: 10.0000 mg | ORAL_TABLET | Freq: Every day | ORAL | Status: DC

## 2014-12-16 NOTE — Telephone Encounter (Signed)
Per Dr. Harrington Challenger to send in 90 days supple of pt medication that was requested by her pharmacy due to insurance.

## 2014-12-16 NOTE — Telephone Encounter (Signed)
90 days supply sent to pt pharmacy

## 2014-12-20 ENCOUNTER — Other Ambulatory Visit (HOSPITAL_COMMUNITY): Payer: Self-pay | Admitting: Psychiatry

## 2014-12-20 ENCOUNTER — Telehealth (HOSPITAL_COMMUNITY): Payer: Self-pay | Admitting: *Deleted

## 2014-12-20 MED ORDER — FLUOXETINE HCL 20 MG PO CAPS: ORAL_CAPSULE | ORAL | Status: DC

## 2014-12-20 NOTE — Telephone Encounter (Signed)
sent 

## 2014-12-20 NOTE — Telephone Encounter (Signed)
Pt pharmacy requesting 90 days authorization for pt Fluoxetin 20 mg 3 Capsules in the morning. Per pt pharmacy her insurance is requiring it. Pt pharmacy number is [phone]

## 2015-01-13 ENCOUNTER — Encounter (HOSPITAL_COMMUNITY): Payer: Self-pay | Admitting: Psychology

## 2015-01-13 ENCOUNTER — Ambulatory Visit (INDEPENDENT_AMBULATORY_CARE_PROVIDER_SITE_OTHER): Payer: Medicare Other | Admitting: Psychology

## 2015-01-13 DIAGNOSIS — F3130 Bipolar disorder, current episode depressed, mild or moderate severity, unspecified: Secondary | ICD-10-CM | POA: Diagnosis not present

## 2015-01-13 NOTE — Progress Notes (Signed)
Patient:  Victoria Mccarthy   DOB: 1946-05-22  MR Number: [id number]  Location: [facility] [address] [place] [address] Dept: [phone]  Start: 11 AM End: 12 PM  Provider/Observer:     Edgardo Roys PSYD  Chief Complaint:      Chief Complaint  Patient presents with  . Agitation  . Anxiety  . Depression  . Stress    Reason For Service:    The patient is a 69 year old divorced Caucasian female. She has been working with Dr. Harrington Challenger for psychiatric care and was referred for psychotherapy. The patient reports that she has a long history of psychiatric illness dating to her 68s. She was hospitalized inpatient on several occasions in the 1980s and was treated by Dr. Dora Sims for some time in [place]. She been seeing another psychiatrist when  Dr. Tamala Julian retired but was not getting good response to the psychiatric interventions that she was being provided. The patient reports that her symptoms deteriorated a great deal and salt other care and thus came to see our office. The patient has a history of psychotic behavior and suicidal ideation. More recently, she reports that she had been depressed all the time, very anxious, and tearful. She reports that is very difficult for her to be around others as well as most of her time isolated with her walks. Hobbies include sewing but has difficulty concentrating.  Interventions Strategy:  Cognitive/behavioral psychotherapeutic interventions  Participation Level:   Active  Participation Quality:  Appropriate      Behavioral Observation:  Well Groomed, Alert, and Appropriate.   Current Psychosocial Factors: A lot of stress with family issues  Content of Session:   Reviewed current symptoms and work on therapeutic interventions around anxiety and depression and times of paranoid ideation.  Current Status:   The patient reports that she has been  doing better on her new regimen provided by Dr. Harrington Challenger.  Patient Progress:   Stable  Target Goals:   Target goals include reducing intensity, severity, and duration of issues related to anxiety, depression, agitation, and avoiding a recurrence of her past history of paranoid ideation.  Last Reviewed:   01/13/2015  Goals Addressed Today:    Today we worked on Therapist, occupational and strategies.  Impression/Diagnosis:   The patient is a long history of psychiatric illnesses she was in her 63s. She has had times where she becomes psychotic and has had a past diagnosis of bipolar disorder and when she gets manic she will become paranoid and have hallucinations. More recently, she had been managed with medications but attempts at medication changes recently resulted in a significant deterioration. She is now seeing Dr. Harrington Challenger for care with regard to these medications.  Diagnosis:    Axis I: Bipolar I disorder, most recent episode depressed   Sakib Noguez R, PsyD 01/13/2015

## 2015-01-14 ENCOUNTER — Encounter (HOSPITAL_COMMUNITY): Payer: Self-pay | Admitting: Psychiatry

## 2015-01-14 ENCOUNTER — Ambulatory Visit (INDEPENDENT_AMBULATORY_CARE_PROVIDER_SITE_OTHER): Payer: Medicare Other | Admitting: Psychiatry

## 2015-01-14 VITALS — BP 153/87 | HR 85 | Ht 65.5 in | Wt 183.6 lb

## 2015-01-14 DIAGNOSIS — F3130 Bipolar disorder, current episode depressed, mild or moderate severity, unspecified: Secondary | ICD-10-CM

## 2015-01-14 MED ORDER — ALPRAZOLAM 1 MG PO TABS: 1.0000 mg | ORAL_TABLET | Freq: Four times a day (QID) | ORAL | Status: DC

## 2015-01-14 MED ORDER — FLUOXETINE HCL 20 MG PO CAPS: ORAL_CAPSULE | ORAL | Status: DC

## 2015-01-14 MED ORDER — OLANZAPINE 10 MG PO TABS: 10.0000 mg | ORAL_TABLET | Freq: Every day | ORAL | Status: DC

## 2015-01-14 NOTE — Progress Notes (Signed)
Patient ID: Victoria Mccarthy, female   DOB: 21-Mar-1946, 69 y.o.   MRN: [id number] Patient ID: Victoria Mccarthy, female   DOB: [birth year], 69 y.o.   MRN: [id number]  Psychiatric Assessment Adult  Patient Identification:  Victoria Mccarthy Date of Evaluation:  01/14/2015 Chief Complaint: "I am doing much better" History of Chief Complaint:   Chief Complaint  Patient presents with  . Depression  . Manic Behavior  . Follow-up    Anxiety Symptoms include decreased concentration and nervous/anxious behavior.     this patient is a 69 year old divorced white female who lives alone in [place]. She has one son, one daughter, 6 grandchildren 3 great-grandchildren. 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  The patient states that she's had problems with mental illness since her 69s. She was hospitalized at [facility] in [facility] twice in the 1980s. During these times she was depressed extremely anxious and having paranoid delusions that she had killed someone and that the police were after her. At one point she was hearing voices telling her to hurt her self. She finally started seeing Dr. Charmayne Sheer private practitioner in [place] who treated her for years. In the past she's been on Prozac, Symbyax, Xanax, Valium, BuSpar and lithium.  After her psychiatrist retired, she began going to the [facility] but got tired of waiting for hours for recheck visit. She most recently she's been going to [facility]. The psychiatrist there has cut down her medication. She used to be on Symbiax 6/50-2 a day and she was cut down to 4-25, once a day. Since this happened she become extremely anxious and depressed. At the beginning of this year however her insurance would not pay for the medicine so she's been off it entirely and she is getting even worse. The physician also cut her Xanax down from 1 mg 4 times a day to 0.5 mg twice a day and she is exceedingly  anxious.  Currently the patient states that she is depressed all the time, very anxious and tearful, irritable and angry. She can't stand to be around people and stays by herself with her dogs. She's trying to spend some time sewing but it's very hard for her to concentrate. Her sleep is poor and her mind races. She is again having the delusion that she may have killed someone at the police may be coming to get her. She has nightmares about this. It's hard for her to sit still. She does not have any direct thoughts of hurting self or others. She constantly picks at her fingernails. She denies any current auditory or visual hallucinations  The patient returns after 3 months. She's doing very well and accommodation of Prozac olanzapine and Xanax. Her mood is stable and she is not anxious her energy is good and she is sewing a lot of purses and trying to sell them online. She does complain of being constantly hungry and I think this is a side effect of olanzapine. Her weight has not changed much the last 6 months. Her blood sugar has been mildly elevated in the past and she is going to see her primary doctor next month. I wrote a note to make sure he checks her hemoglobin Q6V basic metabolic panel and lipid panel. I've also encouraged her to get more exercise. Review of Systems  Constitutional: Positive for activity change, appetite change and unexpected weight change.  HENT: Negative.   Eyes: Negative.  Respiratory: Negative.   Cardiovascular: Negative.   Gastrointestinal: Positive for abdominal pain.  Endocrine: Negative.   Genitourinary: Negative.   Musculoskeletal: Positive for back pain, arthralgias and neck pain.  Skin: Negative.   Allergic/Immunologic: Negative.   Hematological: Negative.   Psychiatric/Behavioral: Positive for hallucinations, sleep disturbance, dysphoric mood, decreased concentration and agitation. The patient is nervous/anxious.    Physical Exam not done  Depressive  Symptoms: depressed mood, anhedonia, insomnia, psychomotor retardation, fatigue, feelings of worthlessness/guilt, difficulty concentrating, hopelessness, anxiety, panic attacks, weight loss, decreased appetite,  (Hypo) Manic Symptoms:   Elevated Mood:  no Irritable Mood:  Yes Grandiosity:  No Distractibility:  Yes Labiality of Mood:  Yes Delusions:  Yes Hallucinations:  No Impulsivity:  No Sexually Inappropriate Behavior:  No Financial Extravagance:  No Flight of Ideas:  No  Anxiety Symptoms: Excessive Worry:  Yes Panic Symptoms:  Yes Agoraphobia:  Yes Obsessive Compulsive: Yes  Symptoms: Picks at nails Specific Phobias:  No Social Anxiety:  Yes  Psychotic Symptoms:  Hallucinations: No None Delusions:  Yes Paranoia:  Yes   Ideas of Reference:  No  PTSD Symptoms: Ever had a traumatic exposure:  No Had a traumatic exposure in the last month:  No Re-experiencing: No None Hypervigilance:  No Hyperarousal: No None Avoidance: No None  Traumatic Brain Injury: Yes Sports Related  Past Psychiatric History: Diagnosis: Bipolar disorder   Hospitalizations: Twice in the 1980s   Outpatient Care: At [facility], with private practitioner   Substance Abuse Care: none  Self-Mutilation:none  Suicidal Attempts: none  Violent Behaviors: none   Past Medical History:   Past Medical History  Diagnosis Date  . PONV (postoperative nausea and vomiting)   . Arthritis     chronic back pain  . Diarrhea     incontinent stools  . Nocturia   . Urinary frequency   . Anemia   . Depression   . Psychotic affective disorder     "psychotic delusions" "I cut myself"   . Restless leg syndrome   . Rheumatic fever   . MVP (mitral valve prolapse)   . Bipolar disorder   . Osteoporosis   . Osteoarthritis   . Diverticulitis     treated at least 5 times in past, hospitalized twice  . Cancer Jan 2013    kidney cancer, left, s/p partial nephrectomy  .  Heart murmur   . GERD (gastroesophageal reflux disease)     esophageal spasms - takes nitroglycerin for this  . Anxiety   . Neuropathy    History of Loss of Consciousness:  No Seizure History:  No Cardiac History:  No Allergies:   Allergies  Allergen Reactions  . Shellfish Allergy Anaphylaxis  . Neurontin [Gabapentin]     Unable to talk when takes medication  . Vicodin [Hydrocodone-Acetaminophen] Itching  . Codeine Itching     itching  . Haloperidol Lactate Other (See Comments)     muscle spasm  . Hydromorphone Hcl Nausea And Vomiting  . Latex Rash  . Propoxyphene N-Acetaminophen Nausea And Vomiting   Current Medications:  Current Outpatient Prescriptions  Medication Sig Dispense Refill  . ALPRAZolam (XANAX) 1 MG tablet Take 1 tablet (1 mg total) by mouth 4 (four) times daily. 120 tablet 2  . cholestyramine (QUESTRAN) 4 GM/DOSE powder Take 1 packet (4 g total) by mouth daily. 378 g 5  . FLUoxetine (PROZAC) 20 MG capsule Take three in the am 270 capsule 2  . mirabegron ER (MYRBETRIQ) 50 MG  TB24 tablet Take 50 mg by mouth daily.    . Misc Natural Products (OSTEO BI-FLEX TRIPLE STRENGTH PO) Take 1,000 mg by mouth 2 (two) times daily.    . Multiple Vitamin (MULTIVITAMIN WITH MINERALS) TABS Take 1 tablet by mouth every evening.    . nitroGLYCERIN (NITROSTAT) 0.4 MG SL tablet Place 0.4 mg under the tongue every 5 (five) minutes as needed for chest pain.    Marland Kitchen OLANZapine (ZYPREXA) 10 MG tablet Take 1 tablet (10 mg total) by mouth at bedtime. 90 tablet 2  . rOPINIRole (REQUIP) 0.5 MG tablet Take 0.5 mg by mouth 3 (three) times daily.      No current facility-administered medications for this visit.    Previous Psychotropic Medications:  Medication Dose   Prozac, Symbyax, Xanax, Valium, BuSpar, lithium                        Substance Abuse History in the last 12 months: Substance Age of 1st Use Last Use Amount Specific Type  Nicotine      Alcohol      Cannabis       Opiates      Cocaine      Methamphetamines      LSD      Ecstasy      Benzodiazepines      Caffeine      Inhalants      Others:                          Medical Consequences of Substance Abuse: none  Legal Consequences of Substance Abuse: none  Family Consequences of Substance Abuse: none  Blackouts:  No DT's:  No Withdrawal Symptoms:  No None  Social History: Current Place of Residence: [place] of Birth: [place] Family Members: 2 children, several grandchildren Marital Status:  Divorced Children:   Sons: 1  Daughters: 1 Relationships: No friends Education:  Dentist Problems/Performance:  Religious Beliefs/Practices: none History of Abuse: none Pensions consultant; Artist, Catering manager History:  None. Legal History: none Hobbies/Interests: TV, sewing  Family History:   Family History  Problem Relation Age of Onset  . Colon cancer Neg Hx   . Bipolar disorder Daughter     Mental Status Examination/Evaluation: Objective:  Appearance: Neat and Well Groomed  Engineer, water::  Fair  Speech:  Pressured  Volume:  Normal  Mood:  good   Affect: bright  Thought Process:  Goal Directed  Orientation:  Full (Time, Place, and Person)  Thought Content:  normal  Suicidal Thoughts:  No  Homicidal Thoughts:  No  Judgement:  Fair  Insight:  Fair  Psychomotor Activity:  Normal   Akathisia:  No  Handed:  Right  AIMS (if indicated):    Assets:  Communication Skills Desire for Improvement Resilience Social Support Talents/Skills    Laboratory/X-Ray Psychological Evaluation(s)   Labs reviewed in the chart are unremarkable      Assessment:  Axis I: Bipolar, mixed  AXIS I Bipolar, mixed  AXIS II Deferred  AXIS III Past Medical History  Diagnosis Date  . PONV (postoperative nausea and vomiting)   . Arthritis     chronic back pain  . Diarrhea     incontinent stools  . Nocturia   . Urinary  frequency   . Anemia   . Depression   . Psychotic affective disorder     "psychotic delusions" "I  cut myself"   . Restless leg syndrome   . Rheumatic fever   . MVP (mitral valve prolapse)   . Bipolar disorder   . Osteoporosis   . Osteoarthritis   . Diverticulitis     treated at least 5 times in past, hospitalized twice  . Cancer Jan 2013    kidney cancer, left, s/p partial nephrectomy  . Heart murmur   . GERD (gastroesophageal reflux disease)     esophageal spasms - takes nitroglycerin for this  . Anxiety   . Neuropathy      AXIS IV other psychosocial or environmental problems  AXIS V 41-50 serious symptoms   Treatment Plan/Recommendations:  Plan of Care: Medication management   Laboratory:  As noted in history of present illness   Psychotherapy: She agrees to counseling here today     She will continue Prozac 60 mg every morning and Zyprexa 10 mg daily at bedtime for her bipolar disorder and delusions. She will continue Xanax 1 mg 4 times a day for anxiety   Routine PRN Medications:  No  Consultations:   Safety Concerns:  She denies thoughts of hurting self or others   Other:  She'll return in 3 months     Levonne Spiller, MD 4/26/20163:24 PM

## 2015-02-05 ENCOUNTER — Ambulatory Visit (INDEPENDENT_AMBULATORY_CARE_PROVIDER_SITE_OTHER): Payer: Medicare Other | Admitting: Psychology

## 2015-02-05 ENCOUNTER — Encounter (HOSPITAL_COMMUNITY): Payer: Self-pay | Admitting: Psychology

## 2015-02-05 DIAGNOSIS — F3130 Bipolar disorder, current episode depressed, mild or moderate severity, unspecified: Secondary | ICD-10-CM

## 2015-02-05 NOTE — Progress Notes (Signed)
Patient:  Victoria Mccarthy   DOB: [birth year]  MR Number: [id number]  Location: [facility] [address] [place] [address] Dept: [phone]  Start: 1 PM End: 2 PM  Provider/Observer:     Edgardo Roys PSYD  Chief Complaint:      Chief Complaint  Patient presents with  . Depression    Reason For Service:    The patient is a 69 year old divorced Caucasian female. She has been working with Dr. Harrington Challenger for psychiatric care and was referred for psychotherapy. The patient reports that she has a long history of psychiatric illness dating to her 35s. She was hospitalized inpatient on several occasions in the 1980s and was treated by Dr. Dora Sims for some time in [place]. She been seeing another psychiatrist when  Dr. Tamala Julian retired but was not getting good response to the psychiatric interventions that she was being provided. The patient reports that her symptoms deteriorated a great deal and salt other care and thus came to see our office. The patient has a history of psychotic behavior and suicidal ideation. More recently, she reports that she had been depressed all the time, very anxious, and tearful. She reports that is very difficult for her to be around others as well as most of her time isolated with her walks. Hobbies include sewing but has difficulty concentrating.  Interventions Strategy:  Cognitive/behavioral psychotherapeutic interventions  Participation Level:   Active  Participation Quality:  Appropriate      Behavioral Observation:  Well Groomed, Alert, and Appropriate.   Current Psychosocial Factors: The patient reports that she has been in depressed mood past two weeks.  She has not been around others much.  Her best (and only friend) is moving away and this has stressed her as well.  Content of Session:   Reviewed current symptoms and work on therapeutic interventions around  anxiety and depression and times of paranoid ideation.  Current Status:   The patient reports that she has been experiencing more depression over the past two weeks.  She has isolated herself more.  Worked on these issues building coping skills and insite.  Patient Progress:   Stable  Target Goals:   Target goals include reducing intensity, severity, and duration of issues related to anxiety, depression, agitation, and avoiding a recurrence of her past history of paranoid ideation.  Last Reviewed:   02/05/2015  Goals Addressed Today:    Today we worked on Therapist, occupational and strategies.  Impression/Diagnosis:   The patient is a long history of psychiatric illnesses she was in her 28s. She has had times where she becomes psychotic and has had a past diagnosis of bipolar disorder and when she gets manic she will become paranoid and have hallucinations. More recently, she had been managed with medications but attempts at medication changes recently resulted in a significant deterioration. She is now seeing Dr. Harrington Challenger for care with regard to these medications.  Diagnosis:    Axis I: Bipolar I disorder, most recent episode depressed   Tyren Dugar R, PsyD 02/05/2015

## 2015-02-24 DIAGNOSIS — M545 Low back pain: Secondary | ICD-10-CM | POA: Diagnosis not present

## 2015-02-25 DIAGNOSIS — E785 Hyperlipidemia, unspecified: Secondary | ICD-10-CM | POA: Diagnosis not present

## 2015-02-25 DIAGNOSIS — Z23 Encounter for immunization: Secondary | ICD-10-CM | POA: Diagnosis not present

## 2015-02-25 DIAGNOSIS — Z1389 Encounter for screening for other disorder: Secondary | ICD-10-CM | POA: Diagnosis not present

## 2015-02-25 DIAGNOSIS — Z0000 Encounter for general adult medical examination without abnormal findings: Secondary | ICD-10-CM | POA: Diagnosis not present

## 2015-02-28 ENCOUNTER — Ambulatory Visit (INDEPENDENT_AMBULATORY_CARE_PROVIDER_SITE_OTHER): Payer: Medicare Other | Admitting: Psychology

## 2015-02-28 DIAGNOSIS — F3130 Bipolar disorder, current episode depressed, mild or moderate severity, unspecified: Secondary | ICD-10-CM

## 2015-03-03 DIAGNOSIS — M545 Low back pain: Secondary | ICD-10-CM | POA: Diagnosis not present

## 2015-03-06 DIAGNOSIS — M47816 Spondylosis without myelopathy or radiculopathy, lumbar region: Secondary | ICD-10-CM | POA: Diagnosis not present

## 2015-03-06 DIAGNOSIS — M5126 Other intervertebral disc displacement, lumbar region: Secondary | ICD-10-CM | POA: Diagnosis not present

## 2015-03-06 DIAGNOSIS — M545 Low back pain: Secondary | ICD-10-CM | POA: Diagnosis not present

## 2015-03-06 DIAGNOSIS — M9973 Connective tissue and disc stenosis of intervertebral foramina of lumbar region: Secondary | ICD-10-CM | POA: Diagnosis not present

## 2015-03-21 ENCOUNTER — Ambulatory Visit (HOSPITAL_COMMUNITY): Payer: Self-pay | Admitting: Psychology

## 2015-03-21 ENCOUNTER — Telehealth (HOSPITAL_COMMUNITY): Payer: Self-pay | Admitting: *Deleted

## 2015-04-01 ENCOUNTER — Ambulatory Visit (HOSPITAL_COMMUNITY): Payer: Self-pay | Admitting: Psychiatry

## 2015-04-01 DIAGNOSIS — Z6831 Body mass index (BMI) 31.0-31.9, adult: Secondary | ICD-10-CM | POA: Diagnosis not present

## 2015-04-01 DIAGNOSIS — R030 Elevated blood-pressure reading, without diagnosis of hypertension: Secondary | ICD-10-CM | POA: Diagnosis not present

## 2015-04-01 DIAGNOSIS — M545 Low back pain: Secondary | ICD-10-CM | POA: Diagnosis not present

## 2015-04-02 ENCOUNTER — Ambulatory Visit (HOSPITAL_COMMUNITY): Payer: Self-pay | Admitting: Psychiatry

## 2015-04-03 ENCOUNTER — Ambulatory Visit (HOSPITAL_COMMUNITY): Payer: Self-pay | Admitting: Psychiatry

## 2015-04-03 ENCOUNTER — Ambulatory Visit (INDEPENDENT_AMBULATORY_CARE_PROVIDER_SITE_OTHER): Payer: Medicare Other | Admitting: Psychiatry

## 2015-04-03 ENCOUNTER — Encounter (HOSPITAL_COMMUNITY): Payer: Self-pay | Admitting: Psychiatry

## 2015-04-03 VITALS — BP 130/87 | HR 88 | Wt 191.2 lb

## 2015-04-03 DIAGNOSIS — F3130 Bipolar disorder, current episode depressed, mild or moderate severity, unspecified: Secondary | ICD-10-CM

## 2015-04-03 MED ORDER — ALPRAZOLAM 1 MG PO TABS: 1.0000 mg | ORAL_TABLET | Freq: Four times a day (QID) | ORAL | Status: DC

## 2015-04-03 MED ORDER — OLANZAPINE 10 MG PO TABS: 10.0000 mg | ORAL_TABLET | Freq: Every day | ORAL | Status: DC

## 2015-04-03 MED ORDER — FLUOXETINE HCL 20 MG PO CAPS: ORAL_CAPSULE | ORAL | Status: DC

## 2015-04-03 NOTE — Progress Notes (Signed)
Patient ID: Victoria Mccarthy, female   DOB: 06/13/1946, 69 y.o.   MRN: [id number] Patient ID: Victoria Mccarthy, female   DOB: 01-09-1946, 69 y.o.   MRN: [id number] Patient ID: Victoria Mccarthy, female   DOB: Nov 22, 1945, 69 y.o.   MRN: [id number]  Psychiatric Assessment Adult  Patient Identification:  Victoria Mccarthy Date of Evaluation:  04/03/2015 Chief Complaint: "I am doing much better" History of Chief Complaint:   Chief Complaint  Patient presents with  . Depression  . Anxiety  . Manic Behavior  . Follow-up    Anxiety Symptoms include decreased concentration and nervous/anxious behavior.     this patient is a 69 year old divorced white female who lives alone in [place]. She has one son, one daughter, 6 grandchildren 3 great-grandchildren. 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  The patient states that she's had problems with mental illness since her 69s. She was hospitalized at [facility] in [facility] twice in the 1980s. During these times she was depressed extremely anxious and having paranoid delusions that she had killed someone and that the police were after her. At one point she was hearing voices telling her to hurt her self. She finally started seeing Dr. Charmayne Sheer private practitioner in [place] who treated her for years. In the past she's been on Prozac, Symbyax, Xanax, Valium, BuSpar and lithium.  After her psychiatrist retired, she began going to the [facility] but got tired of waiting for hours for recheck visit. She most recently she's been going to [place] [facility]. The psychiatrist there has cut down her medication. She used to be on Symbiax 6/50-2 a day and she was cut down to 4-25, once a day. Since this happened she become extremely anxious and depressed. At the beginning of this year however her insurance would not pay for the medicine so she's been off it entirely and she is getting even worse. The physician also cut her Xanax  down from 1 mg 4 times a day to 0.5 mg twice a day and she is exceedingly anxious.  Currently the patient states that she is depressed all the time, very anxious and tearful, irritable and angry. She can't stand to be around people and stays by herself with her dogs. She's trying to spend some time sewing but it's very hard for her to concentrate. Her sleep is poor and her mind races. She is again having the delusion that she may have killed someone at the police may be coming to get her. She has nightmares about this. It's hard for her to sit still. She does not have any direct thoughts of hurting self or others. She constantly picks at her fingernails. She denies any current auditory or visual hallucinations  The patient returns after 3 months. She is doing okay but her family is stressing her right now. Her mother is in the emergency room after having chronic nausea and vomiting. She states that she can't do anything to please her mother. Her kids are very demanding and they do nothing to help her. Her house is falling apart and she wants to sell some of her land to get money to fix it but her kids are very much against this. They have no ownership in the house and therefore they really don't have any say in any of this. The patient has gained about 9 pounds since going back on the psychiatric medications. She asked about making changes by don't think  this is the right time since she is so stressed. I suggested she really watch what she is eating and try to get more exercise and she agrees. She denies being suicidal and does not have any delusions right now Review of Systems  Constitutional: Positive for activity change, appetite change and unexpected weight change.  HENT: Negative.   Eyes: Negative.   Respiratory: Negative.   Cardiovascular: Negative.   Gastrointestinal: Positive for abdominal pain.  Endocrine: Negative.   Genitourinary: Negative.   Musculoskeletal: Positive for back pain,  arthralgias and neck pain.  Skin: Negative.   Allergic/Immunologic: Negative.   Hematological: Negative.   Psychiatric/Behavioral: Positive for hallucinations, sleep disturbance, dysphoric mood, decreased concentration and agitation. The patient is nervous/anxious.    Physical Exam not done  Depressive Symptoms: depressed mood, anhedonia, insomnia, psychomotor retardation, fatigue, feelings of worthlessness/guilt, difficulty concentrating, hopelessness, anxiety, panic attacks, weight loss, decreased appetite,  (Hypo) Manic Symptoms:   Elevated Mood:  no Irritable Mood:  Yes Grandiosity:  No Distractibility:  Yes Labiality of Mood:  Yes Delusions:  Yes Hallucinations:  No Impulsivity:  No Sexually Inappropriate Behavior:  No Financial Extravagance:  No Flight of Ideas:  No  Anxiety Symptoms: Excessive Worry:  Yes Panic Symptoms:  Yes Agoraphobia:  Yes Obsessive Compulsive: Yes  Symptoms: Picks at nails Specific Phobias:  No Social Anxiety:  Yes  Psychotic Symptoms:  Hallucinations: No None Delusions:  Yes Paranoia:  Yes   Ideas of Reference:  No  PTSD Symptoms: Ever had a traumatic exposure:  No Had a traumatic exposure in the last month:  No Re-experiencing: No None Hypervigilance:  No Hyperarousal: No None Avoidance: No None  Traumatic Brain Injury: Yes Sports Related  Past Psychiatric History: Diagnosis: Bipolar disorder   Hospitalizations: Twice in the 1980s   Outpatient Care: At [facility], mental [place], with private practitioner   Substance Abuse Care: none  Self-Mutilation:none  Suicidal Attempts: none  Violent Behaviors: none   Past Medical History:   Past Medical History  Diagnosis Date  . PONV (postoperative nausea and vomiting)   . Arthritis     chronic back pain  . Diarrhea     incontinent stools  . Nocturia   . Urinary frequency   . Anemia   . Depression   . Psychotic affective disorder     "psychotic  delusions" "I cut myself"   . Restless leg syndrome   . Rheumatic fever   . MVP (mitral valve prolapse)   . Bipolar disorder   . Osteoporosis   . Osteoarthritis   . Diverticulitis     treated at least 5 times in past, hospitalized twice  . Cancer Jan 2013    kidney cancer, left, s/p partial nephrectomy  . Heart murmur   . GERD (gastroesophageal reflux disease)     esophageal spasms - takes nitroglycerin for this  . Anxiety   . Neuropathy    History of Loss of Consciousness:  No Seizure History:  No Cardiac History:  No Allergies:   Allergies  Allergen Reactions  . Shellfish Allergy Anaphylaxis  . Neurontin [Gabapentin]     Unable to talk when takes medication  . Vicodin [Hydrocodone-Acetaminophen] Itching  . Codeine Itching     itching  . Haloperidol Lactate Other (See Comments)     muscle spasm  . Hydromorphone Hcl Nausea And Vomiting  . Latex Rash  . Propoxyphene N-Acetaminophen Nausea And Vomiting   Current Medications:  Current Outpatient Prescriptions  Medication Sig Dispense  Refill  . ALPRAZolam (XANAX) 1 MG tablet Take 1 tablet (1 mg total) by mouth 4 (four) times daily. 120 tablet 2  . cholestyramine (QUESTRAN) 4 GM/DOSE powder Take 1 packet (4 g total) by mouth daily. 378 g 5  . FLUoxetine (PROZAC) 20 MG capsule Take three in the am 270 capsule 2  . mirabegron ER (MYRBETRIQ) 50 MG TB24 tablet Take 50 mg by mouth daily.    . Misc Natural Products (OSTEO BI-FLEX TRIPLE STRENGTH PO) Take 1,000 mg by mouth 2 (two) times daily.    . Multiple Vitamin (MULTIVITAMIN WITH MINERALS) TABS Take 1 tablet by mouth every evening.    . nitroGLYCERIN (NITROSTAT) 0.4 MG SL tablet Place 0.4 mg under the tongue every 5 (five) minutes as needed for chest pain.    [place] Kitchen OLANZapine (ZYPREXA) 10 MG tablet Take 1 tablet (10 mg total) by mouth at bedtime. 90 tablet 2  . rOPINIRole (REQUIP) 0.5 MG tablet Take 0.5 mg by mouth 3 (three) times daily.      No current facility-administered  medications for this visit.    Previous Psychotropic Medications:  Medication Dose   Prozac, Symbyax, Xanax, Valium, BuSpar, lithium                        Substance Abuse History in the last 12 months: Substance Age of 1st Use Last Use Amount Specific Type  Nicotine      Alcohol      Cannabis      Opiates      Cocaine      Methamphetamines      LSD      Ecstasy      Benzodiazepines      Caffeine      Inhalants      Others:                          Medical Consequences of Substance Abuse: none  Legal Consequences of Substance Abuse: none  Family Consequences of Substance Abuse: none  Blackouts:  No DT's:  No Withdrawal Symptoms:  No None  Social History: Current Place of Residence: [place] of Birth: [place] Family Members: 2 children, several grandchildren Marital Status:  Divorced Children:   Sons: 1  Daughters: 1 Relationships: No friends Education:  Dentist Problems/Performance:  Religious Beliefs/Practices: none History of Abuse: none Pensions consultant; Artist, Catering manager History:  None. Legal History: none Hobbies/Interests: TV, sewing  Family History:   Family History  Problem Relation Age of Onset  . Colon cancer Neg Hx   . Bipolar disorder Daughter     Mental Status Examination/Evaluation: Objective:  Appearance: Neat and Well Groomed  Engineer, water::  Fair  Speech:  Pressured  Volume:  Normal  Mood: Anxious   Affect: Stressed and dysphoric   Thought Process:  Goal Directed  Orientation:  Full (Time, Place, and Person)  Thought Content:  normal  Suicidal Thoughts:  No  Homicidal Thoughts:  No  Judgement:  Fair  Insight:  Fair  Psychomotor Activity:  Normal   Akathisia:  No  Handed:  Right  AIMS (if indicated):    Assets:  Communication Skills Desire for Improvement Resilience Social Support Talents/Skills    Laboratory/X-Ray Psychological  Evaluation(s)   Labs reviewed in the chart are unremarkable      Assessment:  Axis I: Bipolar, mixed  AXIS I Bipolar, mixed  AXIS II  Deferred  AXIS III Past Medical History  Diagnosis Date  . PONV (postoperative nausea and vomiting)   . Arthritis     chronic back pain  . Diarrhea     incontinent stools  . Nocturia   . Urinary frequency   . Anemia   . Depression   . Psychotic affective disorder     "psychotic delusions" "I cut myself"   . Restless leg syndrome   . Rheumatic fever   . MVP (mitral valve prolapse)   . Bipolar disorder   . Osteoporosis   . Osteoarthritis   . Diverticulitis     treated at least 5 times in past, hospitalized twice  . Cancer Jan 2013    kidney cancer, left, s/p partial nephrectomy  . Heart murmur   . GERD (gastroesophageal reflux disease)     esophageal spasms - takes nitroglycerin for this  . Anxiety   . Neuropathy      AXIS IV other psychosocial or environmental problems  AXIS V 41-50 serious symptoms   Treatment Plan/Recommendations:  Plan of Care: Medication management   Laboratory:  As noted in history of present illness   Psychotherapy: She is seeing Tera Mater here     She will continue Prozac 60 mg every morning for depression and Zyprexa 10 mg daily at bedtime for her bipolar disorder and delusions. She will continue Xanax 1 mg 4 times a day for anxiety   Routine PRN Medications:  No  Consultations:   Safety Concerns:  She denies thoughts of hurting self or others   Other:  She'll return in 6 weeks     Levonne Spiller, MD 7/14/20162:02 PM

## 2015-04-14 ENCOUNTER — Other Ambulatory Visit: Payer: Self-pay

## 2015-04-15 ENCOUNTER — Ambulatory Visit (HOSPITAL_COMMUNITY): Payer: Self-pay | Admitting: Psychiatry

## 2015-04-15 MED ORDER — CHOLESTYRAMINE 4 GM/DOSE PO POWD: 4.0000 g | Freq: Every day | ORAL | Status: DC

## 2015-04-17 ENCOUNTER — Other Ambulatory Visit: Payer: Self-pay

## 2015-05-04 DIAGNOSIS — Z23 Encounter for immunization: Secondary | ICD-10-CM | POA: Diagnosis not present

## 2015-05-06 ENCOUNTER — Ambulatory Visit (INDEPENDENT_AMBULATORY_CARE_PROVIDER_SITE_OTHER): Payer: Medicare Other | Admitting: Psychology

## 2015-05-06 DIAGNOSIS — F3130 Bipolar disorder, current episode depressed, mild or moderate severity, unspecified: Secondary | ICD-10-CM | POA: Diagnosis not present

## 2015-05-16 ENCOUNTER — Ambulatory Visit (HOSPITAL_COMMUNITY): Payer: Self-pay | Admitting: Psychiatry

## 2015-05-27 ENCOUNTER — Ambulatory Visit (INDEPENDENT_AMBULATORY_CARE_PROVIDER_SITE_OTHER): Payer: Medicare Other | Admitting: Psychology

## 2015-05-27 DIAGNOSIS — F3130 Bipolar disorder, current episode depressed, mild or moderate severity, unspecified: Secondary | ICD-10-CM

## 2015-05-28 ENCOUNTER — Other Ambulatory Visit (HOSPITAL_COMMUNITY): Payer: Self-pay | Admitting: Psychiatry

## 2015-05-29 ENCOUNTER — Encounter (HOSPITAL_COMMUNITY): Payer: Self-pay | Admitting: Psychology

## 2015-05-29 NOTE — Progress Notes (Signed)
Patient:  Victoria Mccarthy   DOB: 10-19-1945  MR Number: [id number]  Location: [facility] [address] [place] [address] Dept: [phone]  Start: 1 PM End: 2 PM  Provider/Observer:     Edgardo Roys PSYD  Chief Complaint:      Chief Complaint  Patient presents with  . Anxiety  . Agitation  . Depression    Reason For Service:    The patient is a 69 year old divorced Caucasian female. She has been working with Dr. Harrington Challenger for psychiatric care and was referred for psychotherapy. The patient reports that she has a long history of psychiatric illness dating to her 30s. She was hospitalized inpatient on several occasions in the 1980s and was treated by Dr. Dora Sims for some time in [place]. She been seeing another psychiatrist when  Dr. Tamala Julian retired but was not getting good response to the psychiatric interventions that she was being provided. The patient reports that her symptoms deteriorated a great deal and salt other care and thus came to see our office. The patient has a history of psychotic behavior and suicidal ideation. More recently, she reports that she had been depressed all the time, very anxious, and tearful. She reports that is very difficult for her to be around others as well as most of her time isolated with her walks. Hobbies include sewing but has difficulty concentrating.  Interventions Strategy:  Cognitive/behavioral psychotherapeutic interventions  Participation Level:   Active  Participation Quality:  Appropriate      Behavioral Observation:  Well Groomed, Alert, and Appropriate.   Current Psychosocial Factors: The patient reports that she has been doing a little bit better and has been spending more time in her house with her husband and working on getting things done to get the house on the market. The patient reports that this is helped her overall coping to  certain degree.  Content of Session:   Reviewed current symptoms and work on therapeutic interventions around anxiety and depression and times of paranoid ideation.  Current Status:   The patient reports that she has been experiencing more depression over the past two weeks.  She has isolated herself more.  Worked on these issues building coping skills and insite.  Patient Progress:   Stable  Target Goals:   Target goals include reducing intensity, severity, and duration of issues related to anxiety, depression, agitation, and avoiding a recurrence of her past history of paranoid ideation.  Last Reviewed:   02/28/2015  Goals Addressed Today:    Today we worked on Therapist, occupational and strategies.  Impression/Diagnosis:   The patient is a long history of psychiatric illnesses she was in her 71s. She has had times where she becomes psychotic and has had a past diagnosis of bipolar disorder and when she gets manic she will become paranoid and have hallucinations. More recently, she had been managed with medications but attempts at medication changes recently resulted in a significant deterioration. She is now seeing Dr. Harrington Challenger for care with regard to these medications.  Diagnosis:    Axis I: Bipolar I disorder, most recent episode depressed   Caelan Atchley R, PsyD 05/29/2015

## 2015-05-29 NOTE — Progress Notes (Signed)
Patient:  Victoria Mccarthy   DOB: 01-02-1946  MR Number: [id number]  Location: [facility] [address] [place] [address] Dept: [phone]  Start: 1 PM End: 2 PM  Provider/Observer:     Edgardo Roys PSYD  Chief Complaint:      Chief Complaint  Patient presents with  . Agitation  . Anxiety  . Depression  . Stress    Reason For Service:    The patient is a 69 year old divorced Caucasian female. She has been working with Dr. Harrington Challenger for psychiatric care and was referred for psychotherapy. The patient reports that she has a long history of psychiatric illness dating to her 38s. She was hospitalized inpatient on several occasions in the 1980s and was treated by Dr. Dora Sims for some time in [place]. She been seeing another psychiatrist when  Dr. Tamala Julian retired but was not getting good response to the psychiatric interventions that she was being provided. The patient reports that her symptoms deteriorated a great deal and salt other care and thus came to see our office. The patient has a history of psychotic behavior and suicidal ideation. More recently, she reports that she had been depressed all the time, very anxious, and tearful. She reports that is very difficult for her to be around others as well as most of her time isolated with her walks. Hobbies include sewing but has difficulty concentrating.  Interventions Strategy:  Cognitive/behavioral psychotherapeutic interventions  Participation Level:   Active  Participation Quality:  Appropriate      Behavioral Observation:  Well Groomed, Alert, and Appropriate.   Current Psychosocial Factors: The patient reports that she has been in depressed mood past two weeks.  She has not been around others much.  Her best (and only friend) is moving away and this has stressed her as well.  Content of Session:   Reviewed current symptoms and work on  therapeutic interventions around anxiety and depression and times of paranoid ideation.  Current Status:   The patient reports that she has been experiencing more depression over the past two weeks.  She has isolated herself more.  Worked on these issues building coping skills and insite.  Patient Progress:   Stable  Target Goals:   Target goals include reducing intensity, severity, and duration of issues related to anxiety, depression, agitation, and avoiding a recurrence of her past history of paranoid ideation.  Last Reviewed:   05/27/2015  Goals Addressed Today:    Today we worked on Therapist, occupational and strategies.  Impression/Diagnosis:   The patient is a long history of psychiatric illnesses she was in her 35s. She has had times where she becomes psychotic and has had a past diagnosis of bipolar disorder and when she gets manic she will become paranoid and have hallucinations. More recently, she had been managed with medications but attempts at medication changes recently resulted in a significant deterioration. She is now seeing Dr. Harrington Challenger for care with regard to these medications.  Diagnosis:    Axis I: Bipolar I disorder, most recent episode depressed   Kai Calico R, PsyD 05/29/2015

## 2015-05-29 NOTE — Progress Notes (Signed)
Patient:  Victoria Mccarthy   DOB: Oct 23, 1945  MR Number: [id number]  Location: [facility] [address] [place] [address] Dept: [phone]  Start: 1 PM End: 2 PM  Provider/Observer:     Edgardo Roys PSYD  Chief Complaint:      Chief Complaint  Patient presents with  . Agitation  . Anxiety  . Depression    Reason For Service:    The patient is a 69 year old divorced Caucasian female. She has been working with Dr. Harrington Challenger for psychiatric care and was referred for psychotherapy. The patient reports that she has a long history of psychiatric illness dating to her 57s. She was hospitalized inpatient on several occasions in the 1980s and was treated by Dr. Dora Sims for some time in [place]. She been seeing another psychiatrist when  Dr. Tamala Julian retired but was not getting good response to the psychiatric interventions that she was being provided. The patient reports that her symptoms deteriorated a great deal and salt other care and thus came to see our office. The patient has a history of psychotic behavior and suicidal ideation. More recently, she reports that she had been depressed all the time, very anxious, and tearful. She reports that is very difficult for her to be around others as well as most of her time isolated with her walks. Hobbies include sewing but has difficulty concentrating.  Interventions Strategy:  Cognitive/behavioral psychotherapeutic interventions  Participation Level:   Active  Participation Quality:  Appropriate      Behavioral Observation:  Well Groomed, Alert, and Appropriate.   Current Psychosocial Factors: The patient reports that she has been having more anxiety lately but overall it is been an improvement in general. The patient reports that she is continuing to do a lot around the house to get things to other but has less than desirable help and input from her  husband. She does report that her husband is following his medication regimen more closely and not abusing or misusing any of these pain medicines that he has been prescribed.  Content of Session:   Reviewed current symptoms and work on therapeutic interventions around anxiety and depression and times of paranoid ideation.  Current Status:   The patient reports that she has experienced some reduction in overall levels of depression anxiety and has been actively working on coping skills .  Patient Progress:   Stable  Target Goals:   Target goals include reducing intensity, severity, and duration of issues related to anxiety, depression, agitation, and avoiding a recurrence of her past history of paranoid ideation.  Last Reviewed:   8/16  Goals Addressed Today:    Today we worked on Therapist, occupational and strategies.  Impression/Diagnosis:   The patient is a long history of psychiatric illnesses she was in her 57s. She has had times where she becomes psychotic and has had a past diagnosis of bipolar disorder and when she gets manic she will become paranoid and have hallucinations. More recently, she had been managed with medications but attempts at medication changes recently resulted in a significant deterioration. She is now seeing Dr. Harrington Challenger for care with regard to these medications.  Diagnosis:    Axis I: Bipolar I disorder, most recent episode depressed   Inioluwa Boulay R, PsyD 05/29/2015

## 2015-06-02 DIAGNOSIS — M5136 Other intervertebral disc degeneration, lumbar region: Secondary | ICD-10-CM | POA: Diagnosis not present

## 2015-06-02 DIAGNOSIS — M545 Low back pain: Secondary | ICD-10-CM | POA: Diagnosis not present

## 2015-06-09 DIAGNOSIS — M5136 Other intervertebral disc degeneration, lumbar region: Secondary | ICD-10-CM | POA: Diagnosis not present

## 2015-06-09 DIAGNOSIS — Z981 Arthrodesis status: Secondary | ICD-10-CM | POA: Diagnosis not present

## 2015-06-09 DIAGNOSIS — Z6831 Body mass index (BMI) 31.0-31.9, adult: Secondary | ICD-10-CM | POA: Diagnosis not present

## 2015-06-16 ENCOUNTER — Encounter (HOSPITAL_COMMUNITY): Payer: Self-pay | Admitting: Psychiatry

## 2015-06-16 ENCOUNTER — Ambulatory Visit (INDEPENDENT_AMBULATORY_CARE_PROVIDER_SITE_OTHER): Payer: Medicare Other | Admitting: Psychiatry

## 2015-06-16 VITALS — BP 130/82 | Ht 65.0 in | Wt 188.0 lb

## 2015-06-16 DIAGNOSIS — F3130 Bipolar disorder, current episode depressed, mild or moderate severity, unspecified: Secondary | ICD-10-CM

## 2015-06-16 MED ORDER — FLUOXETINE HCL 40 MG PO CAPS: 40.0000 mg | ORAL_CAPSULE | Freq: Two times a day (BID) | ORAL | Status: DC

## 2015-06-16 MED ORDER — OLANZAPINE 10 MG PO TABS: 10.0000 mg | ORAL_TABLET | Freq: Every day | ORAL | Status: DC

## 2015-06-16 MED ORDER — ALPRAZOLAM 1 MG PO TABS: 1.0000 mg | ORAL_TABLET | Freq: Four times a day (QID) | ORAL | Status: DC

## 2015-06-16 NOTE — Progress Notes (Signed)
Patient ID: Victoria Mccarthy, female   DOB: 10-19-1945, 69 y.o.   MRN: [id number] Patient ID: Victoria Mccarthy, female   DOB: 09/04/1946, 69 y.o.   MRN: [id number] Patient ID: Victoria Mccarthy, female   DOB: [birth year], 69 y.o.   MRN: [id number] Patient ID: Victoria Mccarthy, female   DOB: 25-Jul-1946, 69 y.o.   MRN: [id number]  Psychiatric Assessment Adult  Patient Identification:  Victoria Mccarthy Date of Evaluation:  06/16/2015 Chief Complaint: "I am doing much better" History of Chief Complaint:   Chief Complaint  Patient presents with  . Depression  . Manic Behavior  . Follow-up    Depression        Associated symptoms include decreased concentration and appetite change.  Past medical history includes anxiety.   Anxiety Symptoms include decreased concentration and nervous/anxious behavior.     this patient is a 69 year old divorced white female who lives alone in [place]. She has one son, one daughter, 6 grandchildren 3 great-grandchildren. 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  The patient states that she's had problems with mental illness since her 89s. She was hospitalized at [facility] in [place] twice in the 1980s. During these times she was depressed extremely anxious and having paranoid delusions that she had killed someone and that the police were after her. At one point she was hearing voices telling her to hurt her self. She finally started seeing Dr. Charmayne Sheer private practitioner in [place] who treated her for years. In the past she's been on Prozac, Symbyax, Xanax, Valium, BuSpar and lithium.  After her psychiatrist retired, she began going to the [facility] but got tired of waiting for hours for recheck visit. She most recently she's been going to [facility]. The psychiatrist there has cut down her medication. She used to be on Symbiax 6/50-2 a day and she was cut down to 4-25, once a day. Since this happened she become  extremely anxious and depressed. At the beginning of this year however her insurance would not pay for the medicine so she's been off it entirely and she is getting even worse. The physician also cut her Xanax down from 1 mg 4 times a day to 0.5 mg twice a day and she is exceedingly anxious.  Currently the patient states that she is depressed all the time, very anxious and tearful, irritable and angry. She can't stand to be around people and stays by herself with her dogs. She's trying to spend some time sewing but it's very hard for her to concentrate. Her sleep is poor and her mind races. She is again having the delusion that she may have killed someone at the police may be coming to get her. She has nightmares about this. It's hard for her to sit still. She does not have any direct thoughts of hurting self or others. She constantly picks at her fingernails. She denies any current auditory or visual hallucinations  The patient returns after 3 months. She states that she has become more depressed and she is taking care of her elderly mom. The mother is very demanding and has 10 cats living in the house and crawling all over the kitchen as well as 3 dogs. She states that no matter what she does her mother's never satisfied. She doesn't have any help with her. I suggested she speak to animal control about the situation. I also suggested she get in and out and do  what she needs to do rather than spend all day there. She denies suicidal ideation but deathly feels more depressed and overwhelmed so I agreed to increase her Prozac to 80 mg daily. She's not had any manic symptoms Review of Systems  Constitutional: Positive for activity change, appetite change and unexpected weight change.  HENT: Negative.   Eyes: Negative.   Respiratory: Negative.   Cardiovascular: Negative.   Gastrointestinal: Positive for abdominal pain.  Endocrine: Negative.   Genitourinary: Negative.   Musculoskeletal: Positive for back  pain, arthralgias and neck pain.  Skin: Negative.   Allergic/Immunologic: Negative.   Hematological: Negative.   Psychiatric/Behavioral: Positive for depression, hallucinations, sleep disturbance, dysphoric mood, decreased concentration and agitation. The patient is nervous/anxious.    Physical Exam not done  Depressive Symptoms: depressed mood, anhedonia, insomnia, psychomotor retardation, fatigue, feelings of worthlessness/guilt, difficulty concentrating, hopelessness, anxiety, panic attacks, weight loss, decreased appetite,  (Hypo) Manic Symptoms:   Elevated Mood:  no Irritable Mood:  Yes Grandiosity:  No Distractibility:  Yes Labiality of Mood:  Yes Delusions:  Yes Hallucinations:  No Impulsivity:  No Sexually Inappropriate Behavior:  No Financial Extravagance:  No Flight of Ideas:  No  Anxiety Symptoms: Excessive Worry:  Yes Panic Symptoms:  Yes Agoraphobia:  Yes Obsessive Compulsive: Yes  Symptoms: Picks at nails Specific Phobias:  No Social Anxiety:  Yes  Psychotic Symptoms:  Hallucinations: No None Delusions:  Yes Paranoia:  Yes   Ideas of Reference:  No  PTSD Symptoms: Ever had a traumatic exposure:  No Had a traumatic exposure in the last month:  No Re-experiencing: No None Hypervigilance:  No Hyperarousal: No None Avoidance: No None  Traumatic Brain Injury: Yes Sports Related  Past Psychiatric History: Diagnosis: Bipolar disorder   Hospitalizations: Twice in the 1980s   Outpatient Care: At [facility], mental [place], with private practitioner   Substance Abuse Care: none  Self-Mutilation:none  Suicidal Attempts: none  Violent Behaviors: none   Past Medical History:   Past Medical History  Diagnosis Date  . PONV (postoperative nausea and vomiting)   . Arthritis     chronic back pain  . Diarrhea     incontinent stools  . Nocturia   . Urinary frequency   . Anemia   . Depression   . Psychotic affective disorder      "psychotic delusions" "I cut myself"   . Restless leg syndrome   . Rheumatic fever   . MVP (mitral valve prolapse)   . Bipolar disorder   . Osteoporosis   . Osteoarthritis   . Diverticulitis     treated at least 5 times in past, hospitalized twice  . Cancer Jan 2013    kidney cancer, left, s/p partial nephrectomy  . Heart murmur   . GERD (gastroesophageal reflux disease)     esophageal spasms - takes nitroglycerin for this  . Anxiety   . Neuropathy    History of Loss of Consciousness:  No Seizure History:  No Cardiac History:  No Allergies:   Allergies  Allergen Reactions  . Shellfish Allergy Anaphylaxis  . Neurontin [Gabapentin]     Unable to talk when takes medication  . Vicodin [Hydrocodone-Acetaminophen] Itching  . Codeine Itching     itching  . Haloperidol Lactate Other (See Comments)     muscle spasm  . Hydromorphone Hcl Nausea And Vomiting  . Latex Rash  . Propoxyphene N-Acetaminophen Nausea And Vomiting   Current Medications:  Current Outpatient Prescriptions  Medication Sig Dispense  Refill  . ALPRAZolam (XANAX) 1 MG tablet Take 1 tablet (1 mg total) by mouth 4 (four) times daily. 120 tablet 2  . cholestyramine (QUESTRAN) 4 GM/DOSE powder Take 1 packet (4 g total) by mouth daily. Do not take within 2 hours of other medications 378 g 5  . FLUoxetine (PROZAC) 40 MG capsule Take 1 capsule (40 mg total) by mouth 2 (two) times daily. 60 capsule 2  . mirabegron ER (MYRBETRIQ) 50 MG TB24 tablet Take 50 mg by mouth daily.    . Misc Natural Products (OSTEO BI-FLEX TRIPLE STRENGTH PO) Take 1,000 mg by mouth 2 (two) times daily.    . Multiple Vitamin (MULTIVITAMIN WITH MINERALS) TABS Take 1 tablet by mouth every evening.    . nitroGLYCERIN (NITROSTAT) 0.4 MG SL tablet Place 0.4 mg under the tongue every 5 (five) minutes as needed for chest pain.    Marland Kitchen OLANZapine (ZYPREXA) 10 MG tablet Take 1 tablet (10 mg total) by mouth at bedtime. 180 tablet 2  . rOPINIRole (REQUIP) 0.5 MG  tablet Take 0.5 mg by mouth 3 (three) times daily.      No current facility-administered medications for this visit.    Previous Psychotropic Medications:  Medication Dose   Prozac, Symbyax, Xanax, Valium, BuSpar, lithium                        Substance Abuse History in the last 12 months: Substance Age of 1st Use Last Use Amount Specific Type  Nicotine      Alcohol      Cannabis      Opiates      Cocaine      Methamphetamines      LSD      Ecstasy      Benzodiazepines      Caffeine      Inhalants      Others:                          Medical Consequences of Substance Abuse: none  Legal Consequences of Substance Abuse: none  Family Consequences of Substance Abuse: none  Blackouts:  No DT's:  No Withdrawal Symptoms:  No None  Social History: Current Place of Residence: [place] of Birth: [place] Family Members: 2 children, several grandchildren Marital Status:  Divorced Children:   Sons: 1  Daughters: 1 Relationships: No friends Education:  Dentist Problems/Performance:  Religious Beliefs/Practices: none History of Abuse: none Pensions consultant; Artist, Catering manager History:  None. Legal History: none Hobbies/Interests: TV, sewing  Family History:   Family History  Problem Relation Age of Onset  . Colon cancer Neg Hx   . Bipolar disorder Daughter     Mental Status Examination/Evaluation: Objective:  Appearance: Neat and Well Groomed  Engineer, water::  Fair  Speech:  Pressured  Volume:  Normal  Mood: Anxious   Affect: Stressed and dysphoric   Thought Process:  Goal Directed  Orientation:  Full (Time, Place, and Person)  Thought Content:  normal  Suicidal Thoughts:  No  Homicidal Thoughts:  No  Judgement:  Fair  Insight:  Fair  Psychomotor Activity:  Normal   Akathisia:  No  Handed:  Right  AIMS (if indicated):    Assets:  Communication Skills Desire for  Improvement Resilience Social Support Talents/Skills    Laboratory/X-Ray Psychological Evaluation(s)   Labs reviewed in the chart are unremarkable  Assessment:  Axis I: Bipolar, mixed  AXIS I Bipolar, mixed  AXIS II Deferred  AXIS III Past Medical History  Diagnosis Date  . PONV (postoperative nausea and vomiting)   . Arthritis     chronic back pain  . Diarrhea     incontinent stools  . Nocturia   . Urinary frequency   . Anemia   . Depression   . Psychotic affective disorder     "psychotic delusions" "I cut myself"   . Restless leg syndrome   . Rheumatic fever   . MVP (mitral valve prolapse)   . Bipolar disorder   . Osteoporosis   . Osteoarthritis   . Diverticulitis     treated at least 5 times in past, hospitalized twice  . Cancer Jan 2013    kidney cancer, left, s/p partial nephrectomy  . Heart murmur   . GERD (gastroesophageal reflux disease)     esophageal spasms - takes nitroglycerin for this  . Anxiety   . Neuropathy      AXIS IV other psychosocial or environmental problems  AXIS V 41-50 serious symptoms   Treatment Plan/Recommendations:  Plan of Care: Medication management   Laboratory:  As noted in history of present illness   Psychotherapy: She is seeing Tera Mater here     She will will increase Prozac to 80 mg every morning for depression and continue Zyprexa 10 mg daily at bedtime for her bipolar disorder and delusions. She will continue Xanax 1 mg 4 times a day for anxiety   Routine PRN Medications:  No  Consultations:   Safety Concerns:  She denies thoughts of hurting self or others   Other:  She'll return in 6 weeks     Levonne Spiller, MD 9/26/20163:03 PM

## 2015-06-18 ENCOUNTER — Ambulatory Visit (INDEPENDENT_AMBULATORY_CARE_PROVIDER_SITE_OTHER): Payer: Medicare Other | Admitting: Psychology

## 2015-06-18 ENCOUNTER — Encounter (HOSPITAL_COMMUNITY): Payer: Self-pay | Admitting: Psychology

## 2015-06-18 DIAGNOSIS — F3130 Bipolar disorder, current episode depressed, mild or moderate severity, unspecified: Secondary | ICD-10-CM | POA: Diagnosis not present

## 2015-06-18 NOTE — Progress Notes (Signed)
Patient:  Victoria Mccarthy   DOB: 27-Feb-1946  MR Number: [id number]  Location: [facility] [address] [place] [address] Dept: [id number]  Start: 2 PM End: 3 PM  Provider/Observer:     Edgardo Roys PSYD  Chief Complaint:      Chief Complaint  Patient presents with  . Depression  . Anxiety    Reason For Service:    The patient is a 69 year old divorced Caucasian female. She has been working with Dr. Harrington Challenger for psychiatric care and was referred for psychotherapy. The patient reports that she has a long history of psychiatric illness dating to her 13s. She was hospitalized inpatient on several occasions in the 1980s and was treated by Dr. Dora Sims for some time in [place]. She been seeing another psychiatrist when  Dr. Tamala Julian retired but was not getting good response to the psychiatric interventions that she was being provided. The patient reports that her symptoms deteriorated a great deal and salt other care and thus came to see our office. The patient has a history of psychotic behavior and suicidal ideation. More recently, she reports that she had been depressed all the time, very anxious, and tearful. She reports that is very difficult for her to be around others as well as most of her time isolated with her walks. Hobbies include sewing but has difficulty concentrating.  Interventions Strategy:  Cognitive/behavioral psychotherapeutic interventions  Participation Level:   Active  Participation Quality:  Appropriate      Behavioral Observation:  Well Groomed, Alert, and Appropriate.   Current Psychosocial Factors: The patient reports that she has been more depressed due to issues with mother.  Content of Session:   Reviewed current symptoms and work on therapeutic interventions around anxiety and depression and times of paranoid ideation.  Current Status:   The patient reports  that she has been experiencing more depression over the past month.  She has isolated herself more.  Worked on these issues building coping skills and insite.  Patient Progress:   Stable  Target Goals:   Target goals include reducing intensity, severity, and duration of issues related to anxiety, depression, agitation, and avoiding a recurrence of her past history of paranoid ideation.  Last Reviewed:   06/18/2015  Goals Addressed Today:    Today we worked on Therapist, occupational and strategies.  Impression/Diagnosis:   The patient is a long history of psychiatric illnesses she was in her 31s. She has had times where she becomes psychotic and has had a past diagnosis of bipolar disorder and when she gets manic she will become paranoid and have hallucinations. More recently, she had been managed with medications but attempts at medication changes recently resulted in a significant deterioration. She is now seeing Dr. Harrington Challenger for care with regard to these medications.  Diagnosis:    Axis I: Bipolar I disorder, most recent episode depressed   RODENBOUGH,JOHN R, PsyD 06/18/2015

## 2015-07-03 ENCOUNTER — Other Ambulatory Visit: Payer: Self-pay | Admitting: Neurological Surgery

## 2015-07-03 DIAGNOSIS — M545 Low back pain: Secondary | ICD-10-CM

## 2015-07-10 ENCOUNTER — Ambulatory Visit (HOSPITAL_COMMUNITY): Payer: Self-pay | Admitting: Psychology

## 2015-07-11 ENCOUNTER — Ambulatory Visit (INDEPENDENT_AMBULATORY_CARE_PROVIDER_SITE_OTHER): Payer: Medicare Other | Admitting: Psychology

## 2015-07-11 DIAGNOSIS — F3130 Bipolar disorder, current episode depressed, mild or moderate severity, unspecified: Secondary | ICD-10-CM

## 2015-07-16 ENCOUNTER — Ambulatory Visit
Admission: RE | Admit: 2015-07-16 | Discharge: 2015-07-16 | Disposition: A | Discharge status: Home / Self Care | Payer: Medicare Other | Source: Ambulatory Visit | Attending: Neurological Surgery | Admitting: Neurological Surgery

## 2015-07-16 ENCOUNTER — Other Ambulatory Visit: Payer: Self-pay | Admitting: Neurological Surgery

## 2015-07-16 VITALS — BP 152/83 | HR 73 | Resp 29

## 2015-07-16 DIAGNOSIS — M545 Low back pain: Secondary | ICD-10-CM

## 2015-07-16 DIAGNOSIS — M961 Postlaminectomy syndrome, not elsewhere classified: Secondary | ICD-10-CM

## 2015-07-16 DIAGNOSIS — Z981 Arthrodesis status: Secondary | ICD-10-CM

## 2015-07-16 MED ORDER — SODIUM CHLORIDE 0.9 % IV SOLN
Freq: Once | INTRAVENOUS | Status: AC
  Administered 2015-07-16: 08:00:00 via INTRAVENOUS

## 2015-07-16 MED ORDER — FENTANYL CITRATE (PF) 100 MCG/2ML IJ SOLN
25.0000 ug | INTRAMUSCULAR | Status: DC | PRN
  Administered 2015-07-16: 100 ug via INTRAVENOUS

## 2015-07-16 MED ORDER — KETOROLAC TROMETHAMINE 30 MG/ML IJ SOLN
30.0000 mg | Freq: Once | INTRAMUSCULAR | Status: AC
  Administered 2015-07-16: 30 mg via INTRAVENOUS

## 2015-07-16 MED ORDER — CEFAZOLIN SODIUM-DEXTROSE 2-3 GM-% IV SOLR
2.0000 g | Freq: Once | INTRAVENOUS | Status: AC
  Administered 2015-07-16: 2 g via INTRAVENOUS

## 2015-07-16 MED ORDER — IOHEXOL 180 MG/ML  SOLN: 4.0000 mL | Freq: Once | INTRAMUSCULAR | Status: DC | PRN

## 2015-07-16 MED ORDER — MIDAZOLAM HCL 2 MG/2ML IJ SOLN
1.0000 mg | INTRAMUSCULAR | Status: DC | PRN
  Administered 2015-07-16: 1 mg via INTRAVENOUS

## 2015-07-16 NOTE — Discharge Instructions (Signed)
Discogram Post Procedure Discharge Instructions ° °1. May resume a regular diet and any medications that you routinely take (including pain medications). °2. No driving day of procedure. °3. Upon discharge go home and rest for at least 4 hours.  May use an ice pack as needed to injection sites on back.  Ice to back 30 minutes on and 30 minutes off, all day. °4. May remove bandades later, today. °5. It is not unusual to be sore for several days after this procedure. ° ° ° °Please contact our office at [phone] for the following symptoms: ° °· Fever greater than 100 degrees °· Increased swelling, pain, or redness at injection site. ° ° °Thank you for visiting [facility]. ° ° °

## 2015-07-30 ENCOUNTER — Encounter (HOSPITAL_COMMUNITY): Payer: Self-pay | Admitting: Psychiatry

## 2015-07-30 ENCOUNTER — Ambulatory Visit (INDEPENDENT_AMBULATORY_CARE_PROVIDER_SITE_OTHER): Payer: Medicare Other | Admitting: Psychiatry

## 2015-07-30 VITALS — BP 130/85 | HR 84 | Ht 65.0 in | Wt 193.0 lb

## 2015-07-30 DIAGNOSIS — F3130 Bipolar disorder, current episode depressed, mild or moderate severity, unspecified: Secondary | ICD-10-CM | POA: Diagnosis not present

## 2015-07-30 MED ORDER — FLUOXETINE HCL 40 MG PO CAPS: 40.0000 mg | ORAL_CAPSULE | Freq: Two times a day (BID) | ORAL | Status: DC

## 2015-07-30 MED ORDER — OLANZAPINE 15 MG PO TABS: 15.0000 mg | ORAL_TABLET | Freq: Every day | ORAL | Status: DC

## 2015-07-30 MED ORDER — ALPRAZOLAM 1 MG PO TABS: 1.0000 mg | ORAL_TABLET | Freq: Four times a day (QID) | ORAL | Status: DC

## 2015-07-30 NOTE — Progress Notes (Signed)
Patient ID: KODA DEFRANK, female   DOB: 1945-12-31, 69 y.o.   MRN: [id number] Patient ID: CALEIGHA ZALE, female   DOB: Apr 02, 1946, 69 y.o.   MRN: [id number] Patient ID: LENDORA KEYS, female   DOB: 1946/06/20, 69 y.o.   MRN: [id number] Patient ID: DAVONNE BABY, female   DOB: 1946-05-16, 69 y.o.   MRN: [id number] Patient ID: TAMMEE THIELKE, female   DOB: [birth year], 69 y.o.   MRN: [id number]  Psychiatric Assessment Adult  Patient Identification:  Victoria Mccarthy Date of Evaluation:  07/30/2015 Chief Complaint: "I am tired History of Chief Complaint:   Chief Complaint  Patient presents with  . Depression  . Manic Behavior  . Anxiety  . Follow-up    Depression        Associated symptoms include decreased concentration and appetite change.  Past medical history includes anxiety.   Anxiety Symptoms include decreased concentration and nervous/anxious behavior.     this patient is a 69 year old divorced white female who lives alone in [place]. She has one son, one daughter, 6 grandchildren 3 great-grandchildren. 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  The patient states that she's had problems with mental illness since her 48s. She was hospitalized at [facility] in [facility] twice in the 1980s. During these times she was depressed extremely anxious and having paranoid delusions that she had killed someone and that the police were after her. At one point she was hearing voices telling her to hurt her self. She finally started seeing Dr. Charmayne Sheer private practitioner in [place] who treated her for years. In the past she's been on Prozac, Symbyax, Xanax, Valium, BuSpar and lithium.  After her psychiatrist retired, she began going to the [facility] but got tired of waiting for hours for recheck visit. She most recently she's been going to [place] [facility]. The psychiatrist there has cut down her medication. She used to be on Symbiax 6/50-2 a day  and she was cut down to 4-25, once a day. Since this happened she become extremely anxious and depressed. At the beginning of this year however her insurance would not pay for the medicine so she's been off it entirely and she is getting even worse. The physician also cut her Xanax down from 1 mg 4 times a day to 0.5 mg twice a day and she is exceedingly anxious.  Currently the patient states that she is depressed all the time, very anxious and tearful, irritable and angry. She can't stand to be around people and stays by herself with her dogs. She's trying to spend some time sewing but it's very hard for her to concentrate. Her sleep is poor and her mind races. She is again having the delusion that she may have killed someone at the police may be coming to get her. She has nightmares about this. It's hard for her to sit still. She does not have any direct thoughts of hurting self or others. She constantly picks at her fingernails. She denies any current auditory or visual hallucinations  The patient returns after 2 months. She continues to take care of her elderly mother. The mother is very demanding and has numerous animals to care for so the patient is getting exhausted. She thinks her mother could do a lot more for herself. I strongly encouraged her to take some time off each day and relax. She so worried about all this that she's not sleeping well at  night so I suggested we increase her Zyprexa at bedtime. The increase in Prozac has helped with her depression. I also suggested she ask her mother's primary physician for a home health referral Review of Systems  Constitutional: Positive for activity change, appetite change and unexpected weight change.  HENT: Negative.   Eyes: Negative.   Respiratory: Negative.   Cardiovascular: Negative.   Gastrointestinal: Positive for abdominal pain.  Endocrine: Negative.   Genitourinary: Negative.   Musculoskeletal: Positive for back pain, arthralgias and neck  pain.  Skin: Negative.   Allergic/Immunologic: Negative.   Hematological: Negative.   Psychiatric/Behavioral: Positive for depression, hallucinations, sleep disturbance, dysphoric mood, decreased concentration and agitation. The patient is nervous/anxious.    Physical Exam not done  Depressive Symptoms: depressed mood, anhedonia, insomnia, psychomotor retardation, fatigue, feelings of worthlessness/guilt, difficulty concentrating, hopelessness, anxiety, panic attacks, weight loss, decreased appetite,  (Hypo) Manic Symptoms:   Elevated Mood:  no Irritable Mood:  Yes Grandiosity:  No Distractibility:  Yes Labiality of Mood:  Yes Delusions:  Yes Hallucinations:  No Impulsivity:  No Sexually Inappropriate Behavior:  No Financial Extravagance:  No Flight of Ideas:  No  Anxiety Symptoms: Excessive Worry:  Yes Panic Symptoms:  Yes Agoraphobia:  Yes Obsessive Compulsive: Yes  Symptoms: Picks at nails Specific Phobias:  No Social Anxiety:  Yes  Psychotic Symptoms:  Hallucinations: No None Delusions:  Yes Paranoia:  Yes   Ideas of Reference:  No  PTSD Symptoms: Ever had a traumatic exposure:  No Had a traumatic exposure in the last month:  No Re-experiencing: No None Hypervigilance:  No Hyperarousal: No None Avoidance: No None  Traumatic Brain Injury: Yes Sports Related  Past Psychiatric History: Diagnosis: Bipolar disorder   Hospitalizations: Twice in the 1980s   Outpatient Care: At [facility], mental [place], with private practitioner   Substance Abuse Care: none  Self-Mutilation:none  Suicidal Attempts: none  Violent Behaviors: none   Past Medical History:   Past Medical History  Diagnosis Date  . PONV (postoperative nausea and vomiting)   . Arthritis     chronic back pain  . Diarrhea     incontinent stools  . Nocturia   . Urinary frequency   . Anemia   . Depression   . Psychotic affective disorder ([place])     "psychotic delusions"  "I cut myself"   . Restless leg syndrome   . Rheumatic fever   . MVP (mitral valve prolapse)   . Bipolar disorder ([place])   . Osteoporosis   . Osteoarthritis   . Diverticulitis     treated at least 5 times in past, hospitalized twice  . Cancer [facility]) Jan 2013    kidney cancer, left, s/p partial nephrectomy  . Heart murmur   . GERD (gastroesophageal reflux disease)     esophageal spasms - takes nitroglycerin for this  . Anxiety   . Neuropathy ([facility])    History of Loss of Consciousness:  No Seizure History:  No Cardiac History:  No Allergies:   Allergies  Allergen Reactions  . Shellfish Allergy Anaphylaxis  . Neurontin [Gabapentin]     Unable to talk when takes medication  . Vicodin [Hydrocodone-Acetaminophen] Itching  . Codeine Itching     itching  . Haldol [Haloperidol Lactate] Other (See Comments)    Muscle spasms  . Hydromorphone Hcl Nausea And Vomiting  . Latex Rash  . Propoxyphene N-Acetaminophen Nausea And Vomiting   Current Medications:  Current Outpatient Prescriptions  Medication Sig Dispense Refill  .  ALPRAZolam (XANAX) 1 MG tablet Take 1 tablet (1 mg total) by mouth 4 (four) times daily. 120 tablet 2  . atorvastatin (LIPITOR) 10 MG tablet Take 10 mg by mouth at bedtime.  1  . cholestyramine (QUESTRAN) 4 GM/DOSE powder Take 1 packet (4 g total) by mouth daily. Do not take within 2 hours of other medications 378 g 5  . FLUoxetine (PROZAC) 40 MG capsule Take 1 capsule (40 mg total) by mouth 2 (two) times daily. 60 capsule 2  . methocarbamol (ROBAXIN) 750 MG tablet TAKE 1 TABLET BY ORAL ROUTE EVERY 8 HOURS AS NEEDED  1  . mirabegron ER (MYRBETRIQ) 50 MG TB24 tablet Take 50 mg by mouth daily.    . Misc Natural Products (OSTEO BI-FLEX TRIPLE STRENGTH PO) Take 1,000 mg by mouth 2 (two) times daily.    . Multiple Vitamin (MULTIVITAMIN WITH MINERALS) TABS Take 1 tablet by mouth every evening.    . nitroGLYCERIN (NITROSTAT) 0.4 MG SL tablet Place 0.4 mg under the tongue  every 5 (five) minutes as needed for chest pain.    [place] Kitchen oxyCODONE-acetaminophen (PERCOCET/ROXICET) 5-325 MG tablet Take 1 tablet by mouth every 6 (six) hours as needed.  0  . rOPINIRole (REQUIP) 3 MG tablet TAKE 1 TABLET BY MOUTH 3 TIMES DAILY.  11  . OLANZapine (ZYPREXA) 15 MG tablet Take 1 tablet (15 mg total) by mouth at bedtime. 90 tablet 3   No current facility-administered medications for this visit.    Previous Psychotropic Medications:  Medication Dose   Prozac, Symbyax, Xanax, Valium, BuSpar, lithium                        Substance Abuse History in the last 12 months: Substance Age of 1st Use Last Use Amount Specific Type  Nicotine      Alcohol      Cannabis      Opiates      Cocaine      Methamphetamines      LSD      Ecstasy      Benzodiazepines      Caffeine      Inhalants      Others:                          Medical Consequences of Substance Abuse: none  Legal Consequences of Substance Abuse: none  Family Consequences of Substance Abuse: none  Blackouts:  No DT's:  No Withdrawal Symptoms:  No None  Social History: Current Place of Residence: [place] of Birth: [place] Family Members: 2 children, several grandchildren Marital Status:  Divorced Children:   Sons: 1  Daughters: 1 Relationships: No friends Education:  Dentist Problems/Performance:  Religious Beliefs/Practices: none History of Abuse: none Pensions consultant; Artist, Catering manager History:  None. Legal History: none Hobbies/Interests: TV, sewing  Family History:   Family History  Problem Relation Age of Onset  . Colon cancer Neg Hx   . Bipolar disorder Daughter     Mental Status Examination/Evaluation: Objective:  Appearance: Neat and Well Groomed  Engineer, water::  Fair  Speech:  Pressured  Volume:  Normal  Mood: Anxious   Affect: Tired   Thought Process:  Goal Directed  Orientation:  Full (Time,  Place, and Person)  Thought Content:  normal  Suicidal Thoughts:  No  Homicidal Thoughts:  No  Judgement:  Fair  Insight:  Fair  Psychomotor Activity:  Normal   Akathisia:  No  Handed:  Right  AIMS (if indicated):    Assets:  Communication Skills Desire for Improvement Resilience Social Support Talents/Skills    Laboratory/X-Ray Psychological Evaluation(s)   Labs reviewed in the chart are unremarkable      Assessment:  Axis I: Bipolar, mixed  AXIS I Bipolar, mixed  AXIS II Deferred  AXIS III Past Medical History  Diagnosis Date  . PONV (postoperative nausea and vomiting)   . Arthritis     chronic back pain  . Diarrhea     incontinent stools  . Nocturia   . Urinary frequency   . Anemia   . Depression   . Psychotic affective disorder ([place])     "psychotic delusions" "I cut myself"   . Restless leg syndrome   . Rheumatic fever   . MVP (mitral valve prolapse)   . Bipolar disorder ([place])   . Osteoporosis   . Osteoarthritis   . Diverticulitis     treated at least 5 times in past, hospitalized twice  . Cancer [facility]) Jan 2013    kidney cancer, left, s/p partial nephrectomy  . Heart murmur   . GERD (gastroesophageal reflux disease)     esophageal spasms - takes nitroglycerin for this  . Anxiety   . Neuropathy ([place])      AXIS IV other psychosocial or environmental problems  AXIS V 41-50 serious symptoms   Treatment Plan/Recommendations:  Plan of Care: Medication management   Laboratory:  As noted in history of present illness   Psychotherapy: She is seeing Tera Mater here     She will will continue Prozac 80 mg every morning for depression and continue Zyprexa but increase the dose to 15 mg daily at bedtime for her bipolar disorder and delusions. She will continue Xanax 1 mg 4 times a day for anxiety   Routine PRN Medications:  No  Consultations:   Safety Concerns:  She denies thoughts of hurting self or others   Other:  She'll return in 6 weeks     Levonne Spiller, MD 11/9/20162:57 PM

## 2015-08-01 ENCOUNTER — Encounter (HOSPITAL_COMMUNITY): Payer: Self-pay | Admitting: Psychology

## 2015-08-01 ENCOUNTER — Ambulatory Visit (INDEPENDENT_AMBULATORY_CARE_PROVIDER_SITE_OTHER): Payer: Medicare Other | Admitting: Psychology

## 2015-08-01 DIAGNOSIS — F3130 Bipolar disorder, current episode depressed, mild or moderate severity, unspecified: Secondary | ICD-10-CM

## 2015-08-01 NOTE — Progress Notes (Signed)
Patient:  Victoria Mccarthy   DOB: 04-Jan-1946  MR Number: [id number]  Location: [facility] [address] [place] [address] Dept: [phone]  Start:  3 PM End:  4 PM  Provider/Observer:     Edgardo Roys PSYD  Chief Complaint:      Chief Complaint  Patient presents with  . Agitation  . Anxiety  . Stress  . Depression    Reason For Service:    The patient is a 69 year old divorced Caucasian female. She has been working with Dr. Harrington Challenger for psychiatric care and was referred for psychotherapy. The patient reports that she has a long history of psychiatric illness dating to her 89s. She was hospitalized inpatient on several occasions in the 1980s and was treated by Dr. Dora Sims for some time in [place]. She been seeing another psychiatrist when  Dr. Tamala Julian retired but was not getting good response to the psychiatric interventions that she was being provided. The patient reports that her symptoms deteriorated a great deal and salt other care and thus came to see our office. The patient has a history of psychotic behavior and suicidal ideation. More recently, she reports that she had been depressed all the time, very anxious, and tearful. She reports that is very difficult for her to be around others as well as most of her time isolated with her walks. Hobbies include sewing but has difficulty concentrating.  Interventions Strategy:  Cognitive/behavioral psychotherapeutic interventions  Participation Level:   Active  Participation Quality:  Appropriate      Behavioral Observation:  Well Groomed, Alert, and Appropriate.   Current Psychosocial Factors: The patient reports that  She has decided to go to her high school reunion and has dealt with some of the anxiety and worries that around the the patient reports that she has become very comfortable with the concept of doing this. The patient  reports that she will be going to this on Saturday night. The patient reports that she continues to struggle with the responsibilities of taking care of her mother.  Content of Session:   Reviewed current symptoms and work on therapeutic interventions around anxiety and depression and times of paranoid ideation.  Current Status:   The patient reports that she has  Experienced an improvement in her overall depression anxiety recently has been actively working on coping skills the strategies we have develop..  Patient Progress:   Stable  Target Goals:   Target goals include reducing intensity, severity, and duration of issues related to anxiety, depression, agitation, and avoiding a recurrence of her past history of paranoid ideation.  Last Reviewed:    08/01/2015  Goals Addressed Today:    Today we worked on Therapist, occupational and strategies.  Impression/Diagnosis:   The patient is a long history of psychiatric illnesses she was in her 51s. She has had times where she becomes psychotic and has had a past diagnosis of bipolar disorder and when she gets manic she will become paranoid and have hallucinations. More recently, she had been managed with medications but attempts at medication changes recently resulted in a significant deterioration. She is now seeing Dr. Harrington Challenger for care with regard to these medications.  Diagnosis:    Axis I: Bipolar I disorder, most recent episode depressed ([place])   RODENBOUGH,JOHN R, PsyD 08/01/2015

## 2015-08-11 ENCOUNTER — Encounter (HOSPITAL_COMMUNITY): Payer: Self-pay | Admitting: Psychology

## 2015-08-11 NOTE — Progress Notes (Signed)
Patient:  Victoria Mccarthy   DOB: 1946-08-31  MR Number: [id number]  Location: [facility] [address] [place] [address] Dept: [phone]  Start: 2 PM End: 3 PM  Provider/Observer:     Edgardo Roys PSYD  Chief Complaint:      Chief Complaint  Patient presents with  . Agitation  . Anxiety  . Depression  . Stress    Reason For Service:    The patient is a 69 year old divorced Caucasian female. She has been working with Dr. Harrington Challenger for psychiatric care and was referred for psychotherapy. The patient reports that she has a long history of psychiatric illness dating to her 13s. She was hospitalized inpatient on several occasions in the 1980s and was treated by Dr. Dora Sims for some time in [place]. She been seeing another psychiatrist when  Dr. Tamala Julian retired but was not getting good response to the psychiatric interventions that she was being provided. The patient reports that her symptoms deteriorated a great deal and salt other care and thus came to see our office. The patient has a history of psychotic behavior and suicidal ideation. More recently, she reports that she had been depressed all the time, very anxious, and tearful. She reports that is very difficult for her to be around others as well as most of her time isolated with her walks. Hobbies include sewing but has difficulty concentrating.  Interventions Strategy:  Cognitive/behavioral psychotherapeutic interventions  Participation Level:   Active  Participation Quality:  Appropriate      Behavioral Observation:  Well Groomed, Alert, and Appropriate.   Current Psychosocial Factors: The patient reports that she has been more depressed due to issues with mother.  Content of Session:   Reviewed current symptoms and work on therapeutic interventions around anxiety and depression and times of paranoid ideation.  Current  Status:   The patient reports that she has been experiencing more depression over the past month.  She has isolated herself more.  Worked on these issues building coping skills and insite.  Patient Progress:   Stable  Target Goals:   Target goals include reducing intensity, severity, and duration of issues related to anxiety, depression, agitation, and avoiding a recurrence of her past history of paranoid ideation.  Last Reviewed:   06/03/2015  Goals Addressed Today:    Today we worked on Therapist, occupational and strategies.  Impression/Diagnosis:   The patient is a long history of psychiatric illnesses she was in her 20s. She has had times where she becomes psychotic and has had a past diagnosis of bipolar disorder and when she gets manic she will become paranoid and have hallucinations. More recently, she had been managed with medications but attempts at medication changes recently resulted in a significant deterioration. She is now seeing Dr. Harrington Challenger for care with regard to these medications.  Diagnosis:    Axis I: Bipolar I disorder, most recent episode depressed ([place])   Haasini Patnaude R, PsyD 08/11/2015

## 2015-08-18 ENCOUNTER — Other Ambulatory Visit (HOSPITAL_COMMUNITY): Payer: Self-pay | Admitting: Neurological Surgery

## 2015-08-18 DIAGNOSIS — M4806 Spinal stenosis, lumbar region: Secondary | ICD-10-CM | POA: Diagnosis not present

## 2015-08-18 DIAGNOSIS — R030 Elevated blood-pressure reading, without diagnosis of hypertension: Secondary | ICD-10-CM | POA: Diagnosis not present

## 2015-08-18 DIAGNOSIS — Z6831 Body mass index (BMI) 31.0-31.9, adult: Secondary | ICD-10-CM | POA: Diagnosis not present

## 2015-09-03 ENCOUNTER — Ambulatory Visit (HOSPITAL_COMMUNITY)
Admission: RE | Admit: 2015-09-03 | Discharge: 2015-09-03 | Disposition: A | Discharge status: Home / Self Care | Payer: Medicare Other | Source: Ambulatory Visit | Attending: Neurological Surgery | Admitting: Neurological Surgery

## 2015-09-03 ENCOUNTER — Encounter (HOSPITAL_COMMUNITY)
Admission: RE | Admit: 2015-09-03 | Discharge: 2015-09-03 | Disposition: A | Discharge status: Home / Self Care | Payer: Medicare Other | Source: Ambulatory Visit | Attending: Neurological Surgery | Admitting: Neurological Surgery

## 2015-09-03 ENCOUNTER — Ambulatory Visit (HOSPITAL_COMMUNITY): Payer: Self-pay | Admitting: Psychology

## 2015-09-03 DIAGNOSIS — J984 Other disorders of lung: Secondary | ICD-10-CM | POA: Insufficient documentation

## 2015-09-03 DIAGNOSIS — Z01812 Encounter for preprocedural laboratory examination: Secondary | ICD-10-CM | POA: Diagnosis not present

## 2015-09-03 DIAGNOSIS — M48061 Spinal stenosis, lumbar region without neurogenic claudication: Secondary | ICD-10-CM

## 2015-09-03 DIAGNOSIS — Z01810 Encounter for preprocedural cardiovascular examination: Secondary | ICD-10-CM | POA: Diagnosis not present

## 2015-09-03 DIAGNOSIS — Z01818 Encounter for other preprocedural examination: Secondary | ICD-10-CM | POA: Insufficient documentation

## 2015-09-03 LAB — CBC WITH DIFFERENTIAL/PLATELET
BASOS PCT: 0 %
Basophils Absolute: 0 10*3/uL (ref 0.0–0.1)
EOS ABS: 0 10*3/uL (ref 0.0–0.7)
EOS PCT: 0 %
HCT: 39.5 % (ref 36.0–46.0)
HEMOGLOBIN: 13 g/dL (ref 12.0–15.0)
Lymphocytes Relative: 32 %
Lymphs Abs: 1.5 10*3/uL (ref 0.7–4.0)
MCH: 31.1 pg (ref 26.0–34.0)
MCHC: 32.9 g/dL (ref 30.0–36.0)
MCV: 94.5 fL (ref 78.0–100.0)
Monocytes Absolute: 0.4 10*3/uL (ref 0.1–1.0)
Monocytes Relative: 9 %
NEUTROS PCT: 59 %
Neutro Abs: 2.9 10*3/uL (ref 1.7–7.7)
PLATELETS: 184 10*3/uL (ref 150–400)
RBC: 4.18 MIL/uL (ref 3.87–5.11)
RDW: 12.5 % (ref 11.5–15.5)
WBC: 4.9 10*3/uL (ref 4.0–10.5)

## 2015-09-03 LAB — BASIC METABOLIC PANEL
Anion gap: 8 (ref 5–15)
BUN: 12 mg/dL (ref 6–20)
CALCIUM: 9.4 mg/dL (ref 8.9–10.3)
CO2: 26 mmol/L (ref 22–32)
CREATININE: 0.79 mg/dL (ref 0.44–1.00)
Chloride: 104 mmol/L (ref 101–111)
Glucose, Bld: 175 mg/dL — ABNORMAL HIGH (ref 65–99)
Potassium: 3.9 mmol/L (ref 3.5–5.1)
SODIUM: 138 mmol/L (ref 135–145)

## 2015-09-03 LAB — TYPE AND SCREEN
ABO/RH(D): O POS
ANTIBODY SCREEN: NEGATIVE

## 2015-09-03 LAB — SURGICAL PCR SCREEN
MRSA, PCR: NEGATIVE
Staphylococcus aureus: POSITIVE — AB

## 2015-09-03 LAB — PROTIME-INR
INR: 0.94 (ref 0.00–1.49)
PROTHROMBIN TIME: 12.8 s (ref 11.6–15.2)

## 2015-09-03 NOTE — Progress Notes (Addendum)
Patient has history of MVP d/t rheumatic fever. Had it checked out "yrs ago" "everything is OK" Has not had any chest discomfort, and has not been to see a cardio in yrs. She takes nitro for her esophageal spasms. Nissen Cleopatra Cedar was done d/t her 'bad heartburn".  No changes from last visit in 2014.

## 2015-09-03 NOTE — Pre-Procedure Instructions (Signed)
Nassim Klepper [place]win  09/03/2015      [facility] - [place], [place] - [address] [place]  [address] Phone: [phone] Fax: [phone]  [id number] - [place], [place] [address] [place] [address] Phone: [phone] Fax: [phone]    Your procedure is scheduled on Thursday, Dec. 22nd   Report to [facility] Admitting at 10:45 AM   Call this number if you have problems the morning of surgery:  [phone]   Remember:  Do not eat food or drink liquids after midnight Wednesday.  Take these medicines the morning of surgery with A SIP OF WATER : Xanax, Prozac, Oxycodone, Robaxin   Do not wear jewelry, make-up or nail polish.  Do not wear lotions, powders, or perfumes.  You may NOT wear deodorant the morning of surgery.  Do not shave underarms & legs 48 hours prior to surgery.     Do not bring valuables to the hospital.  [facility] is not responsible for any belongings or valuables.  Contacts, dentures or bridgework may not be worn into surgery.  Leave your suitcase in the car.  After surgery it may be brought to your room.  For patients admitted to the hospital, discharge time will be determined by your treatment team.   Name and phone number of your driver:     Please read over the following fact sheets that you were given. Pain Booklet, Coughing and Deep Breathing, Blood Transfusion Information, MRSA Information and Surgical Site Infection Prevention

## 2015-09-03 NOTE — Progress Notes (Signed)
Mupirocin Ointment Rx called [facility] [place] for positive PCR of Staph. Pt notified and voiced understanding.

## 2015-09-04 NOTE — Progress Notes (Addendum)
Anesthesia Chart Review:  Pt is a 69 year old female scheduled for L4-5, L5-S1 maximum access PLIF, removal of L2-4 hardware on 09/11/2015 with Dr. Ronnald Ramp.   PMH includes:  MVP, rheumatic fever, heart murmur, anemia, bipolar disorder, psychotic affective disorder, renal cancer, anemia, GERD, post-op N/V. Never smoker. BMI 31. S/p maximum access PLIF 07/25/14 and 01/04/13. S/p laparoscopic L nephrectomy 08/23/11.   Medications include: lipitor, cholestyramine.   Preoperative labs reviewed.  Glucose 175  Chest x-ray 09/03/15 reviewed. Streaky basilar scarring changes but no acute pulmonary findings.  EKG 09/03/15: NSR.   Cardiac cath 03/21/01:  1. No significant CAD (ramus intermedius had 50% proximal stenosis, CX 60% distal stenosis).  2. Normal LV systolic function. EF 50-55%  By notes on cath report, pt had an echo around 02/2001 that showed normal LV function with mildly thickened aortic valve.   Reviewed case with Dr. Lissa Hoard. Pt will need cardiac clearance prior to surgery. Notified Jessica in Dr. Ronnald Ramp' office.   Willeen Cass, FNP-BC [facility]/Anesthesiology Phone: [phone] 09/04/2015 4:54 PM  Addendum:  Pt saw Dr. Wynonia Lawman with cardiology 12/19 for pre-op eval. He cleared pt for surgery at average risk.   If no changes, I anticipate pt can proceed with surgery as scheduled.   Willeen Cass, FNP-BC [facility] Stay [facility]/Anesthesiology Phone: [phone] 09/10/2015 11:48 AM

## 2015-09-08 DIAGNOSIS — Z01810 Encounter for preprocedural cardiovascular examination: Secondary | ICD-10-CM | POA: Diagnosis not present

## 2015-09-08 DIAGNOSIS — E668 Other obesity: Secondary | ICD-10-CM | POA: Diagnosis not present

## 2015-09-08 DIAGNOSIS — I341 Nonrheumatic mitral (valve) prolapse: Secondary | ICD-10-CM | POA: Diagnosis not present

## 2015-09-10 MED ORDER — DEXAMETHASONE SODIUM PHOSPHATE 10 MG/ML IJ SOLN
10.0000 mg | INTRAMUSCULAR | Status: AC
  Administered 2015-09-11: 10 mg via INTRAVENOUS
  Filled 2015-09-10: qty 1

## 2015-09-10 MED ORDER — CEFAZOLIN SODIUM-DEXTROSE 2-3 GM-% IV SOLR
2.0000 g | INTRAVENOUS | Status: AC
  Administered 2015-09-11: 2 g via INTRAVENOUS
  Filled 2015-09-10: qty 50

## 2015-09-11 ENCOUNTER — Encounter (HOSPITAL_COMMUNITY)
Admission: AD | Disposition: A | Discharge status: Home / Self Care | Payer: Medicare Other | Source: Ambulatory Visit | Attending: Neurological Surgery

## 2015-09-11 ENCOUNTER — Inpatient Hospital Stay (HOSPITAL_COMMUNITY): Payer: Medicare Other | Admitting: Emergency Medicine

## 2015-09-11 ENCOUNTER — Inpatient Hospital Stay (HOSPITAL_COMMUNITY): Payer: Medicare Other | Admitting: Certified Registered Nurse Anesthetist

## 2015-09-11 ENCOUNTER — Encounter (HOSPITAL_COMMUNITY): Payer: Self-pay | Admitting: Neurological Surgery

## 2015-09-11 ENCOUNTER — Inpatient Hospital Stay (HOSPITAL_COMMUNITY)
Admission: AD | Admit: 2015-09-11 | Discharge: 2015-09-13 | DRG: 460 | Disposition: A | Discharge status: Home / Self Care | Payer: Medicare Other | Source: Ambulatory Visit | Attending: Neurological Surgery | Admitting: Neurological Surgery

## 2015-09-11 ENCOUNTER — Inpatient Hospital Stay (HOSPITAL_COMMUNITY): Payer: Medicare Other

## 2015-09-11 DIAGNOSIS — G2581 Restless legs syndrome: Secondary | ICD-10-CM | POA: Diagnosis present

## 2015-09-11 DIAGNOSIS — M47816 Spondylosis without myelopathy or radiculopathy, lumbar region: Secondary | ICD-10-CM | POA: Diagnosis present

## 2015-09-11 DIAGNOSIS — Z91040 Latex allergy status: Secondary | ICD-10-CM

## 2015-09-11 DIAGNOSIS — F419 Anxiety disorder, unspecified: Secondary | ICD-10-CM | POA: Diagnosis present

## 2015-09-11 DIAGNOSIS — Z885 Allergy status to narcotic agent status: Secondary | ICD-10-CM

## 2015-09-11 DIAGNOSIS — K219 Gastro-esophageal reflux disease without esophagitis: Secondary | ICD-10-CM | POA: Diagnosis present

## 2015-09-11 DIAGNOSIS — M549 Dorsalgia, unspecified: Secondary | ICD-10-CM | POA: Diagnosis not present

## 2015-09-11 DIAGNOSIS — M4326 Fusion of spine, lumbar region: Secondary | ICD-10-CM | POA: Diagnosis not present

## 2015-09-11 DIAGNOSIS — M810 Age-related osteoporosis without current pathological fracture: Secondary | ICD-10-CM | POA: Diagnosis present

## 2015-09-11 DIAGNOSIS — F319 Bipolar disorder, unspecified: Secondary | ICD-10-CM | POA: Diagnosis present

## 2015-09-11 DIAGNOSIS — Z79899 Other long term (current) drug therapy: Secondary | ICD-10-CM

## 2015-09-11 DIAGNOSIS — D649 Anemia, unspecified: Secondary | ICD-10-CM | POA: Diagnosis not present

## 2015-09-11 DIAGNOSIS — Z419 Encounter for procedure for purposes other than remedying health state, unspecified: Secondary | ICD-10-CM

## 2015-09-11 DIAGNOSIS — Z888 Allergy status to other drugs, medicaments and biological substances status: Secondary | ICD-10-CM | POA: Diagnosis not present

## 2015-09-11 DIAGNOSIS — Z91013 Allergy to seafood: Secondary | ICD-10-CM | POA: Diagnosis not present

## 2015-09-11 DIAGNOSIS — Z981 Arthrodesis status: Secondary | ICD-10-CM

## 2015-09-11 DIAGNOSIS — M4806 Spinal stenosis, lumbar region: Secondary | ICD-10-CM | POA: Diagnosis present

## 2015-09-11 DIAGNOSIS — Z90710 Acquired absence of both cervix and uterus: Secondary | ICD-10-CM | POA: Diagnosis not present

## 2015-09-11 HISTORY — PX: MAXIMUM ACCESS (MAS)POSTERIOR LUMBAR INTERBODY FUSION (PLIF) 2 LEVEL: SHX6369

## 2015-09-11 SURGERY — FOR MAXIMUM ACCESS (MAS) POSTERIOR LUMBAR INTERBODY FUSION (PLIF) 2 LEVEL
Anesthesia: General | Site: Back

## 2015-09-11 MED ORDER — MORPHINE SULFATE (PF) 2 MG/ML IV SOLN
INTRAVENOUS | Status: AC
  Filled 2015-09-11: qty 1

## 2015-09-11 MED ORDER — VANCOMYCIN HCL 1000 MG IV SOLR
INTRAVENOUS | Status: AC
  Filled 2015-09-11: qty 1000

## 2015-09-11 MED ORDER — SUCCINYLCHOLINE CHLORIDE 20 MG/ML IJ SOLN
INTRAMUSCULAR | Status: AC
  Filled 2015-09-11: qty 2

## 2015-09-11 MED ORDER — 0.9 % SODIUM CHLORIDE (POUR BTL) OPTIME
TOPICAL | Status: DC | PRN
  Administered 2015-09-11: 1000 mL

## 2015-09-11 MED ORDER — CEFAZOLIN SODIUM 1-5 GM-% IV SOLN
1.0000 g | Freq: Three times a day (TID) | INTRAVENOUS | Status: AC
  Administered 2015-09-11 – 2015-09-12 (×2): 1 g via INTRAVENOUS
  Filled 2015-09-11 (×2): qty 50

## 2015-09-11 MED ORDER — MORPHINE SULFATE (PF) 2 MG/ML IV SOLN
1.0000 mg | INTRAVENOUS | Status: DC | PRN
Start: 2015-09-11 — End: 2015-09-11
  Administered 2015-09-11: 2 mg via INTRAVENOUS

## 2015-09-11 MED ORDER — ARTIFICIAL TEARS OP OINT
TOPICAL_OINTMENT | OPHTHALMIC | Status: AC
  Filled 2015-09-11: qty 3.5

## 2015-09-11 MED ORDER — MIDAZOLAM HCL 5 MG/5ML IJ SOLN
INTRAMUSCULAR | Status: DC | PRN
  Administered 2015-09-11: 2 mg via INTRAVENOUS

## 2015-09-11 MED ORDER — MENTHOL 3 MG MT LOZG: 1.0000 | LOZENGE | OROMUCOSAL | Status: DC | PRN

## 2015-09-11 MED ORDER — METHOCARBAMOL 500 MG PO TABS
ORAL_TABLET | ORAL | Status: AC
  Filled 2015-09-11: qty 1

## 2015-09-11 MED ORDER — DIPHENHYDRAMINE HCL 50 MG/ML IJ SOLN
INTRAMUSCULAR | Status: AC
  Filled 2015-09-11: qty 1

## 2015-09-11 MED ORDER — LIDOCAINE HCL (CARDIAC) 20 MG/ML IV SOLN
INTRAVENOUS | Status: DC | PRN
  Administered 2015-09-11: 100 mg via INTRAVENOUS

## 2015-09-11 MED ORDER — VANCOMYCIN HCL 1000 MG IV SOLR
INTRAVENOUS | Status: DC | PRN
  Administered 2015-09-11: 1000 mg

## 2015-09-11 MED ORDER — PROPOFOL 10 MG/ML IV BOLUS
INTRAVENOUS | Status: AC
  Filled 2015-09-11: qty 40

## 2015-09-11 MED ORDER — DIPHENHYDRAMINE HCL 50 MG/ML IJ SOLN
INTRAMUSCULAR | Status: DC | PRN
  Administered 2015-09-11: 12.5 mg via INTRAVENOUS

## 2015-09-11 MED ORDER — OXYCODONE HCL 5 MG/5ML PO SOLN: 5.0000 mg | Freq: Once | ORAL | Status: AC | PRN

## 2015-09-11 MED ORDER — ONDANSETRON HCL 4 MG/2ML IJ SOLN
INTRAMUSCULAR | Status: AC
  Filled 2015-09-11: qty 2

## 2015-09-11 MED ORDER — FENTANYL CITRATE (PF) 250 MCG/5ML IJ SOLN
INTRAMUSCULAR | Status: AC
  Filled 2015-09-11: qty 5

## 2015-09-11 MED ORDER — ONDANSETRON HCL 4 MG/2ML IJ SOLN
INTRAMUSCULAR | Status: DC | PRN
  Administered 2015-09-11: 4 mg via INTRAVENOUS

## 2015-09-11 MED ORDER — MORPHINE SULFATE (PF) 2 MG/ML IV SOLN: 1.0000 mg | INTRAVENOUS | Status: DC | PRN

## 2015-09-11 MED ORDER — THROMBIN 20000 UNITS EX SOLR
CUTANEOUS | Status: DC | PRN
  Administered 2015-09-11: 12:00:00 via TOPICAL

## 2015-09-11 MED ORDER — ACETAMINOPHEN 325 MG PO TABS: 325.0000 mg | ORAL_TABLET | ORAL | Status: DC | PRN

## 2015-09-11 MED ORDER — OXYCODONE-ACETAMINOPHEN 5-325 MG PO TABS
1.0000 | ORAL_TABLET | ORAL | Status: DC | PRN
  Administered 2015-09-11 – 2015-09-13 (×8): 2 via ORAL
  Filled 2015-09-11 (×8): qty 2

## 2015-09-11 MED ORDER — GLYCOPYRROLATE 0.2 MG/ML IJ SOLN
INTRAMUSCULAR | Status: DC | PRN
  Administered 2015-09-11: 0.2 mg via INTRAVENOUS

## 2015-09-11 MED ORDER — ACETAMINOPHEN 650 MG RE SUPP: 650.0000 mg | RECTAL | Status: DC | PRN

## 2015-09-11 MED ORDER — METHOCARBAMOL 750 MG PO TABS
750.0000 mg | ORAL_TABLET | Freq: Three times a day (TID) | ORAL | Status: DC | PRN
  Administered 2015-09-11 – 2015-09-13 (×4): 750 mg via ORAL
  Filled 2015-09-11 (×3): qty 1

## 2015-09-11 MED ORDER — SODIUM CHLORIDE 0.9 % IJ SOLN
3.0000 mL | Freq: Two times a day (BID) | INTRAMUSCULAR | Status: DC
  Administered 2015-09-11 – 2015-09-13 (×3): 3 mL via INTRAVENOUS

## 2015-09-11 MED ORDER — VANCOMYCIN HCL IN DEXTROSE 1-5 GM/200ML-% IV SOLN
INTRAVENOUS | Status: AC
  Filled 2015-09-11: qty 200

## 2015-09-11 MED ORDER — SCOPOLAMINE 1 MG/3DAYS TD PT72
MEDICATED_PATCH | TRANSDERMAL | Status: AC
  Filled 2015-09-11: qty 1

## 2015-09-11 MED ORDER — LIDOCAINE HCL (CARDIAC) 20 MG/ML IV SOLN
INTRAVENOUS | Status: AC
  Filled 2015-09-11: qty 10

## 2015-09-11 MED ORDER — CELECOXIB 200 MG PO CAPS
200.0000 mg | ORAL_CAPSULE | Freq: Two times a day (BID) | ORAL | Status: DC
  Administered 2015-09-11 – 2015-09-13 (×4): 200 mg via ORAL
  Filled 2015-09-11 (×4): qty 1

## 2015-09-11 MED ORDER — SODIUM CHLORIDE 0.9 % IR SOLN
Status: DC | PRN
  Administered 2015-09-11: 12:00:00

## 2015-09-11 MED ORDER — BUPIVACAINE HCL (PF) 0.25 % IJ SOLN
INTRAMUSCULAR | Status: DC | PRN
  Administered 2015-09-11: 7 mL

## 2015-09-11 MED ORDER — MIRABEGRON ER 25 MG PO TB24
50.0000 mg | ORAL_TABLET | Freq: Every day | ORAL | Status: DC
  Administered 2015-09-12 – 2015-09-13 (×2): 50 mg via ORAL
  Filled 2015-09-11 (×2): qty 2

## 2015-09-11 MED ORDER — SODIUM CHLORIDE 0.9 % IV SOLN: 250.0000 mL | INTRAVENOUS | Status: DC

## 2015-09-11 MED ORDER — MIDAZOLAM HCL 2 MG/2ML IJ SOLN
INTRAMUSCULAR | Status: AC
  Filled 2015-09-11: qty 2

## 2015-09-11 MED ORDER — OXYCODONE HCL 5 MG PO TABS
ORAL_TABLET | ORAL | Status: AC
  Filled 2015-09-11: qty 1

## 2015-09-11 MED ORDER — SODIUM CHLORIDE 0.9 % IJ SOLN: 3.0000 mL | INTRAMUSCULAR | Status: DC | PRN

## 2015-09-11 MED ORDER — THROMBIN 5000 UNITS EX SOLR
OROMUCOSAL | Status: DC | PRN
  Administered 2015-09-11: 12:00:00 via TOPICAL

## 2015-09-11 MED ORDER — OXYCODONE HCL 5 MG PO TABS
5.0000 mg | ORAL_TABLET | Freq: Once | ORAL | Status: AC | PRN
  Administered 2015-09-11: 5 mg via ORAL

## 2015-09-11 MED ORDER — SENNA 8.6 MG PO TABS
1.0000 | ORAL_TABLET | Freq: Two times a day (BID) | ORAL | Status: DC
  Administered 2015-09-11 – 2015-09-13 (×4): 8.6 mg via ORAL
  Filled 2015-09-11 (×4): qty 1

## 2015-09-11 MED ORDER — PROPOFOL 500 MG/50ML IV EMUL
INTRAVENOUS | Status: DC | PRN
  Administered 2015-09-11: 50 ug/kg/min via INTRAVENOUS

## 2015-09-11 MED ORDER — OLANZAPINE 7.5 MG PO TABS
15.0000 mg | ORAL_TABLET | Freq: Every day | ORAL | Status: DC
Start: 2015-09-11 — End: 2015-09-13
  Administered 2015-09-11 – 2015-09-12 (×2): 15 mg via ORAL
  Filled 2015-09-11 (×3): qty 2

## 2015-09-11 MED ORDER — ACETAMINOPHEN 325 MG PO TABS: 650.0000 mg | ORAL_TABLET | ORAL | Status: DC | PRN

## 2015-09-11 MED ORDER — ONDANSETRON HCL 4 MG/2ML IJ SOLN: 4.0000 mg | INTRAMUSCULAR | Status: DC | PRN

## 2015-09-11 MED ORDER — ACETAMINOPHEN 160 MG/5ML PO SOLN: 325.0000 mg | ORAL | Status: DC | PRN

## 2015-09-11 MED ORDER — PHENYLEPHRINE HCL 10 MG/ML IJ SOLN
INTRAMUSCULAR | Status: AC
  Filled 2015-09-11: qty 1

## 2015-09-11 MED ORDER — POTASSIUM CHLORIDE IN NACL 20-0.9 MEQ/L-% IV SOLN
INTRAVENOUS | Status: DC
  Filled 2015-09-11 (×5): qty 1000

## 2015-09-11 MED ORDER — PROPOFOL 10 MG/ML IV BOLUS
INTRAVENOUS | Status: DC | PRN
  Administered 2015-09-11: 150 mg via INTRAVENOUS

## 2015-09-11 MED ORDER — SUCCINYLCHOLINE CHLORIDE 20 MG/ML IJ SOLN
INTRAMUSCULAR | Status: DC | PRN
  Administered 2015-09-11: 60 mg via INTRAVENOUS

## 2015-09-11 MED ORDER — STERILE WATER FOR INJECTION IJ SOLN
INTRAMUSCULAR | Status: AC
  Filled 2015-09-11: qty 10

## 2015-09-11 MED ORDER — PHENYLEPHRINE HCL 10 MG/ML IJ SOLN
10.0000 mg | INTRAVENOUS | Status: DC | PRN
  Administered 2015-09-11: 20 ug/min via INTRAVENOUS

## 2015-09-11 MED ORDER — LACTATED RINGERS IV SOLN
INTRAVENOUS | Status: DC
  Administered 2015-09-11 (×2): via INTRAVENOUS

## 2015-09-11 MED ORDER — PHENYLEPHRINE 40 MCG/ML (10ML) SYRINGE FOR IV PUSH (FOR BLOOD PRESSURE SUPPORT)
PREFILLED_SYRINGE | INTRAVENOUS | Status: AC
  Filled 2015-09-11: qty 10

## 2015-09-11 MED ORDER — EPHEDRINE SULFATE 50 MG/ML IJ SOLN
INTRAMUSCULAR | Status: AC
  Filled 2015-09-11: qty 1

## 2015-09-11 MED ORDER — FENTANYL CITRATE (PF) 100 MCG/2ML IJ SOLN
INTRAMUSCULAR | Status: DC | PRN
  Administered 2015-09-11 (×4): 50 ug via INTRAVENOUS
  Administered 2015-09-11: 100 ug via INTRAVENOUS
  Administered 2015-09-11 (×2): 50 ug via INTRAVENOUS

## 2015-09-11 MED ORDER — ALPRAZOLAM 0.5 MG PO TABS
1.0000 mg | ORAL_TABLET | Freq: Four times a day (QID) | ORAL | Status: DC
Start: 2015-09-11 — End: 2015-09-13
  Administered 2015-09-11 – 2015-09-13 (×7): 1 mg via ORAL
  Filled 2015-09-11 (×7): qty 2

## 2015-09-11 MED ORDER — SCOPOLAMINE 1 MG/3DAYS TD PT72
MEDICATED_PATCH | TRANSDERMAL | Status: DC | PRN
  Administered 2015-09-11: 1 via TRANSDERMAL

## 2015-09-11 MED ORDER — EPHEDRINE SULFATE 50 MG/ML IJ SOLN
INTRAMUSCULAR | Status: DC | PRN
  Administered 2015-09-11 (×2): 10 mg via INTRAVENOUS
  Administered 2015-09-11: 5 mg via INTRAVENOUS
  Administered 2015-09-11: 10 mg via INTRAVENOUS

## 2015-09-11 MED ORDER — PHENOL 1.4 % MT LIQD: 1.0000 | OROMUCOSAL | Status: DC | PRN

## 2015-09-11 SURGICAL SUPPLY — 67 items
APL SKNCLS STERI-STRIP NONHPOA (GAUZE/BANDAGES/DRESSINGS)
BAG DECANTER FOR FLEXI CONT (MISCELLANEOUS) ×2 IMPLANT
BENZOIN TINCTURE PRP APPL 2/3 (GAUZE/BANDAGES/DRESSINGS) ×1 IMPLANT
BIT DRILL PLIF MAS 5.0MM DISP (DRILL) IMPLANT
BLADE CLIPPER SURG (BLADE) IMPLANT
BONE CANC CHIPS 20CC PCAN1/4 (Bone Implant) ×2 IMPLANT
BONE MATRIX OSTEOCEL PRO MED (Bone Implant) ×1 IMPLANT
BUR MATCHSTICK NEURO 3.0 LAGG (BURR) ×2 IMPLANT
CAGE COROENT MP 8X23 (Cage) ×4 IMPLANT
CANISTER SUCT 3000ML PPV (MISCELLANEOUS) ×2 IMPLANT
CHIPS CANC BONE 20CC PCAN1/4 (Bone Implant) ×1 IMPLANT
CLIP NEUROVISION LG (CLIP) ×1 IMPLANT
CONT SPEC 4OZ CLIKSEAL STRL BL (MISCELLANEOUS) ×2 IMPLANT
COVER BACK TABLE 24X17X13 BIG (DRAPES) IMPLANT
COVER BACK TABLE 60X90IN (DRAPES) ×2 IMPLANT
DRAPE C-ARM 42X72 X-RAY (DRAPES) ×3 IMPLANT
DRAPE C-ARMOR (DRAPES) ×1 IMPLANT
DRAPE LAPAROTOMY 100X72X124 (DRAPES) ×2 IMPLANT
DRAPE POUCH INSTRU U-SHP 10X18 (DRAPES) ×2 IMPLANT
DRAPE SURG 17X23 STRL (DRAPES) ×2 IMPLANT
DRILL PLIF MAS 5.0MM DISP (DRILL) ×2
DRSG OPSITE POSTOP 4X10 (GAUZE/BANDAGES/DRESSINGS) ×1 IMPLANT
DURAPREP 26ML APPLICATOR (WOUND CARE) ×2 IMPLANT
ELECT REM PT RETURN 9FT ADLT (ELECTROSURGICAL) ×2
ELECTRODE REM PT RTRN 9FT ADLT (ELECTROSURGICAL) ×1 IMPLANT
EVACUATOR 1/8 PVC DRAIN (DRAIN) ×2 IMPLANT
GAUZE SPONGE 4X4 16PLY XRAY LF (GAUZE/BANDAGES/DRESSINGS) IMPLANT
GLOVE BIO SURGEON STRL SZ8 (GLOVE) ×2 IMPLANT
GLOVE SURG SS PI 7.5 STRL IVOR (GLOVE) ×2 IMPLANT
GLOVE SURG SS PI 8.0 STRL IVOR (GLOVE) ×3 IMPLANT
GOWN STRL REUS W/ TWL LRG LVL3 (GOWN DISPOSABLE) IMPLANT
GOWN STRL REUS W/ TWL XL LVL3 (GOWN DISPOSABLE) ×2 IMPLANT
GOWN STRL REUS W/TWL 2XL LVL3 (GOWN DISPOSABLE) IMPLANT
GOWN STRL REUS W/TWL LRG LVL3 (GOWN DISPOSABLE) ×2
GOWN STRL REUS W/TWL XL LVL3 (GOWN DISPOSABLE) ×4
GRAFT BNE CANC CHIPS 1-8 20CC (Bone Implant) IMPLANT
HEMOSTAT POWDER KIT SURGIFOAM (HEMOSTASIS) ×1 IMPLANT
KIT BASIN OR (CUSTOM PROCEDURE TRAY) ×2 IMPLANT
KIT ROOM TURNOVER OR (KITS) ×2 IMPLANT
MILL MEDIUM DISP (BLADE) IMPLANT
MODULE NVM5 NEXT GEN EMG (NEEDLE) ×1 IMPLANT
NDL HYPO 25X1 1.5 SAFETY (NEEDLE) ×1 IMPLANT
NEEDLE HYPO 25X1 1.5 SAFETY (NEEDLE) ×2 IMPLANT
NS IRRIG 1000ML POUR BTL (IV SOLUTION) ×2 IMPLANT
PACK LAMINECTOMY NEURO (CUSTOM PROCEDURE TRAY) ×2 IMPLANT
PAD ARMBOARD 7.5X6 YLW CONV (MISCELLANEOUS) ×6 IMPLANT
ROD 95MM (Rod) ×2 IMPLANT
SCREW 5.0X30 (Screw) ×1 IMPLANT
SCREW LOCK (Screw) ×16 IMPLANT
SCREW LOCK FXNS SPNE MAS PL (Screw) IMPLANT
SCREW PLIF MAS 5.0X35 (Screw) ×2 IMPLANT
SCREW SHANK 6.5X30 (Screw) ×2 IMPLANT
SCREW SHANK 6.5X65 (Screw) ×2 IMPLANT
SCREW TULIP 5.5 (Screw) ×4 IMPLANT
SPONGE LAP 4X18 X RAY DECT (DISPOSABLE) IMPLANT
SPONGE SURGIFOAM ABS GEL 100 (HEMOSTASIS) ×2 IMPLANT
STRIP CLOSURE SKIN 1/2X4 (GAUZE/BANDAGES/DRESSINGS) ×3 IMPLANT
SUT VIC AB 0 CT1 18XCR BRD8 (SUTURE) ×1 IMPLANT
SUT VIC AB 0 CT1 8-18 (SUTURE) ×4
SUT VIC AB 2-0 CP2 18 (SUTURE) ×2 IMPLANT
SUT VIC AB 3-0 SH 8-18 (SUTURE) ×5 IMPLANT
SYR 3ML LL SCALE MARK (SYRINGE) IMPLANT
TOWEL OR 17X24 6PK STRL BLUE (TOWEL DISPOSABLE) ×2 IMPLANT
TOWEL OR 17X26 10 PK STRL BLUE (TOWEL DISPOSABLE) ×2 IMPLANT
TRAP SPECIMEN MUCOUS 40CC (MISCELLANEOUS) ×1 IMPLANT
TRAY FOLEY BAG SILVER LF 16FR (SET/KITS/TRAYS/PACK) ×1 IMPLANT
WATER STERILE IRR 1000ML POUR (IV SOLUTION) ×2 IMPLANT

## 2015-09-11 NOTE — Anesthesia Procedure Notes (Signed)
Procedure Name: Intubation Date/Time: 09/11/2015 12:05 PM Performed by: [place] Pink Pre-anesthesia Checklist: Patient identified, Emergency Drugs available, Suction available, Patient being monitored and Timeout performed Patient Re-evaluated:Patient Re-evaluated prior to inductionOxygen Delivery Method: Circle system utilized Preoxygenation: Pre-oxygenation with 100% oxygen Intubation Type: IV induction Ventilation: Mask ventilation without difficulty Laryngoscope Size: Mac and 3 Grade View: Grade I Tube type: Oral Tube size: 7.0 mm Number of attempts: 1 Airway Equipment and Method: Stylet Placement Confirmation: ETT inserted through vocal cords under direct vision,  positive ETCO2 and breath sounds checked- equal and bilateral Secured at: 21 cm Tube secured with: Tape Dental Injury: Teeth and Oropharynx as per pre-operative assessment

## 2015-09-11 NOTE — Transfer of Care (Signed)
Immediate Anesthesia Transfer of Care Note  Patient: Victoria Mccarthy  Procedure(s) Performed: Procedure(s): LUMBAR FOUR-FIVE LUMBAR FIVE SACRAL ONE  MAXIMUM ACCESS SURGERY, POSTERIOR LUMBAR INTERBODY FUSION , Removal of LUMBAR TWO TO LUMBAR FOUR  HARDWARE (N/A)  Patient Location: PACU  Anesthesia Type:General  Level of Consciousness: sedated and responds to stimulation  Airway & Oxygen Therapy: Patient Spontanous Breathing and Patient connected to face mask oxygen  Post-op Assessment: Report given to RN and Post -op Vital signs reviewed and stable  Post vital signs: Reviewed and stable  Last Vitals:  Filed Vitals:   09/11/15 1056  BP: 159/65  Pulse: 79  Temp: 36.3 C  Resp: 20    Complications: No apparent anesthesia complications

## 2015-09-11 NOTE — H&P (Signed)
Subjective: Patient is a 69 y.o. female admitted for PLIF. Onset of symptoms was several months ago, gradually worsening since that time.  The pain is rated severe, and is located at the across the lower back and radiates to her legs. The pain is described as aching and occurs all day. The symptoms have been progressive. Symptoms are exacerbated by exercise. MRI or CT showed adjacent level spondylosis at L4-5 and L5-S1. Discography was concordant at L4-5 and L5-S1.   Past Medical History  Diagnosis Date  . PONV (postoperative nausea and vomiting)   . Arthritis     chronic back pain  . Diarrhea     incontinent stools  . Nocturia   . Urinary frequency   . Anemia   . Depression   . Psychotic affective disorder ([place])     "psychotic delusions" "I cut myself"   . Restless leg syndrome   . Rheumatic fever   . MVP (mitral valve prolapse)   . Bipolar disorder ([place])   . Osteoporosis   . Osteoarthritis   . Diverticulitis     treated at least 5 times in past, hospitalized twice  . Cancer [facility]) Jan 2013    kidney cancer, left, s/p partial nephrectomy  . Heart murmur   . GERD (gastroesophageal reflux disease)     esophageal spasms - takes nitroglycerin for this  . Anxiety   . Neuropathy [facility])     Past Surgical History  Procedure Laterality Date  . Tonsillectomy    . Abdominal hysterectomy    . Cholecystectomy    . Carpell tunnell      rt wrist  x2  . Nissen fundoplication    . Rotator cuff repair      right side X 2  . Robot assisted laparoscopic nephrectomy  08/23/2011    Procedure: ROBOTIC ASSISTED LAPAROSCOPIC NEPHRECTOMY;  Surgeon: Dutch Gray, MD;  Location: [facility] ORS;  Service: Urology;  Laterality: Left;  Left Robotic Assisted  Laparoscopic Partial Nephrectomy   . Esophagogastroduodenoscopy  04/06/2010    intact Nissen fundoplication S/P dilation, somewhat baggy atonic appearing esophagus, 58-F dilation  . Esophagogastroduodenoscopy  1/11    nomral esophagus s/p 54-F [place]  dilation, normal/intact Nissen fundoplication, diffuse patchy erythema and erosions likely NSAID/ASA effect with benign biopsy  . Colonoscopy  5/02    pancolonic deverticula, internal hemorrhoids  . Colonoscopy  1/11    single external hemorrhoidal tag and anal papilla otherwise normal rectum, pancolonic diverticula, s/p sigmoid biopsy and stool sampling all unremarkable  . Back surgery    . Kidney surgery for cancer    . Back surgery      2 plates, 8 screws in back  . Eye surgery      right eye cataract  . Maximum access (mas)posterior lumbar interbody fusion (plif) 1 level N/A 07/25/2014    Procedure: FOR MAXIMUM ACCESS (MAS) POSTERIOR LUMBAR INTERBODY FUSION (PLIF) Lumbar two/three, Removal of hardware Lumbar three/four;  Surgeon: Eustace Moore, MD;  Location: [facility];  Service: Neurosurgery;  Laterality: N/A;    Prior to Admission medications   Medication Sig Start Date End Date Taking? Authorizing Provider  ALPRAZolam Duanne Moron) 1 MG tablet Take 1 tablet (1 mg total) by mouth 4 (four) times daily. 07/30/15 07/29/16 Yes Cloria Spring, MD  atorvastatin (LIPITOR) 10 MG tablet Take 10 mg by mouth at bedtime. 05/24/15  Yes Historical Provider, MD  cholestyramine [name]) 4 GM/DOSE powder Take 1 packet (4 g total) by mouth daily. Do not  take within 2 hours of other medications 04/15/15  Yes Mahala Menghini, PA-C  FLUoxetine (PROZAC) 40 MG capsule Take 1 capsule (40 mg total) by mouth 2 (two) times daily. 07/30/15 07/29/16 Yes Cloria Spring, MD  methocarbamol (ROBAXIN) 750 MG tablet TAKE 1 TABLET BY ORAL ROUTE EVERY 8 HOURS AS NEEDED 05/23/15  Yes Historical Provider, MD  mirabegron ER (MYRBETRIQ) 50 MG TB24 tablet Take 50 mg by mouth daily.   Yes Historical Provider, MD  Multiple Vitamin (MULTIVITAMIN WITH MINERALS) TABS Take 1 tablet by mouth every evening.   Yes Historical Provider, MD  nitroGLYCERIN (NITROSTAT) 0.4 MG SL tablet Place 0.4 mg under the tongue every 5 (five) minutes as needed for  chest pain.   Yes Historical Provider, MD  OLANZapine (ZYPREXA) 15 MG tablet Take 1 tablet (15 mg total) by mouth at bedtime. 07/30/15  Yes Cloria Spring, MD  oxyCODONE-acetaminophen (PERCOCET/ROXICET) 5-325 MG tablet Take 1 tablet by mouth every 6 (six) hours as needed. 07/22/15  Yes Historical Provider, MD  rOPINIRole (REQUIP) 3 MG tablet TAKE 1 TABLET BY MOUTH 3 TIMES DAILY. 07/21/15  Yes Historical Provider, MD   Allergies  Allergen Reactions  . Shellfish Allergy Anaphylaxis  . Neurontin [Gabapentin]     Unable to talk when takes medication  . Vicodin [Hydrocodone-Acetaminophen] Itching  . Codeine Itching     itching  . Haldol [Haloperidol Lactate] Other (See Comments)    Muscle spasms  . Hydromorphone Hcl Nausea And Vomiting  . Latex Rash  . Propoxyphene N-Acetaminophen Nausea And Vomiting    Social History  Substance Use Topics  . Smoking status: Never Smoker   . Smokeless tobacco: Not on file  . Alcohol Use: No    Family History  Problem Relation Age of Onset  . Colon cancer Neg Hx   . Bipolar disorder Daughter      Review of Systems  Positive ROS: Negative  All other systems have been reviewed and were otherwise negative with the exception of those mentioned in the HPI and as above.  Objective: Vital signs in last 24 hours:    General Appearance: Alert, cooperative, no distress, appears stated age Head: Normocephalic, without obvious abnormality, atraumatic Eyes: PERRL, conjunctiva/corneas clear, EOM's intact    Neck: Supple, symmetrical, trachea midline Back: Symmetric, no curvature, ROM normal, no CVA tenderness Lungs:  respirations unlabored Heart: Regular rate and rhythm Abdomen: Soft, non-tender Extremities: Extremities normal, atraumatic, no cyanosis or edema Pulses: 2+ and symmetric all extremities Skin: Skin color, texture, turgor normal, no rashes or lesions  NEUROLOGIC:   Mental status: Alert and oriented x4,  no aphasia, good attention span,  fund of knowledge, and memory Motor Exam - grossly normal Sensory Exam - grossly normal Reflexes: 1+ Coordination - grossly normal Gait - grossly normal Balance - grossly normal Cranial Nerves: I: smell Not tested  II: visual acuity  OS: nl    OD: nl  II: visual fields Full to confrontation  II: pupils Equal, round, reactive to light  III,VII: ptosis None  III,IV,VI: extraocular muscles  Full ROM  V: mastication Normal  V: facial light touch sensation  Normal  V,VII: corneal reflex  Present  VII: facial muscle function - upper  Normal  VII: facial muscle function - lower Normal  VIII: hearing Not tested  IX: soft palate elevation  Normal  IX,X: gag reflex Present  XI: trapezius strength  5/5  XI: sternocleidomastoid strength 5/5  XI: neck flexion strength  5/5  XII:  tongue strength  Normal    Data Review Lab Results  Component Value Date   WBC 4.9 09/03/2015   HGB 13.0 09/03/2015   HCT 39.5 09/03/2015   MCV 94.5 09/03/2015   PLT 184 09/03/2015   Lab Results  Component Value Date   NA 138 09/03/2015   K 3.9 09/03/2015   CL 104 09/03/2015   CO2 26 09/03/2015   BUN 12 09/03/2015   CREATININE 0.79 09/03/2015   GLUCOSE 175* 09/03/2015   Lab Results  Component Value Date   INR 0.94 09/03/2015    Assessment/Plan: Patient admitted for PLIF L4-5 L5-S1. Patient has failed a reasonable attempt at conservative therapy.  I explained the condition and procedure to the patient and answered any questions.  Patient wishes to proceed with procedure as planned. Understands risks/ benefits and typical outcomes of procedure.   Elsworth Ledin S 09/11/2015 10:55 AM

## 2015-09-11 NOTE — Anesthesia Preprocedure Evaluation (Signed)
Anesthesia Evaluation  Patient identified by MRN, date of birth, ID band Patient awake    Reviewed: Allergy & Precautions, NPO status , Patient's Chart, lab work & pertinent test results  History of Anesthesia Complications (+) PONV and history of anesthetic complications  Airway Mallampati: III  TM Distance: <3 FB Neck ROM: Full    Dental  (+) Edentulous Upper, Edentulous Lower   Pulmonary neg pulmonary ROS,    breath sounds clear to auscultation       Cardiovascular negative cardio ROS   Rhythm:Regular     Neuro/Psych neg Seizures PSYCHIATRIC DISORDERS Anxiety Depression Bipolar Disorder  Neuromuscular disease    GI/Hepatic Neg liver ROS, GERD  Controlled,  Endo/Other  Morbid obesity  Renal/GU negative Renal ROS     Musculoskeletal  (+) Arthritis ,   Abdominal   Peds  Hematology negative hematology ROS (+)   Anesthesia Other Findings   Reproductive/Obstetrics                             Anesthesia Physical Anesthesia Plan  ASA: II  Anesthesia Plan: General   Post-op Pain Management:    Induction: Intravenous  Airway Management Planned: Oral ETT  Additional Equipment: None  Intra-op Plan:   Post-operative Plan: Extubation in OR  Informed Consent: I have reviewed the patients History and Physical, chart, labs and discussed the procedure including the risks, benefits and alternatives for the proposed anesthesia with the patient or authorized representative who has indicated his/her understanding and acceptance.   Dental advisory given  Plan Discussed with: CRNA and Surgeon  Anesthesia Plan Comments:         Anesthesia Quick Evaluation

## 2015-09-11 NOTE — Progress Notes (Signed)
Pt arrived to unit via stretcher from PACU. Recently had oxy IR and robaxin for pain. States pain level still 5. Welcomed pt to unit and oriented to unit and equipment. No other concerns at this time. Wendee Copp

## 2015-09-11 NOTE — Op Note (Signed)
09/11/2015  4:07 PM  PATIENT:  Victoria Mccarthy  69 y.o. female  PRE-OPERATIVE DIAGNOSIS:  Adjacent level spondylosis and stenosis L4-5 L5-S1  POST-OPERATIVE DIAGNOSIS:  Same  PROCEDURE:   1. Decompressive lumbar laminectomy L4-5 and L5-S1 requiring more work than would be required for a simple exposure of the disk for PLIF in order to adequately decompress the neural elements and address the spinal stenosis 2. Posterior lumbar interbody fusion L4-5 and L5-S1 using PEEK interbody cages packed with morcellized allograft and autograft 3. Posterior fixation L3-S1 inclusive using cortical pedicle screws.  4. Intertransverse arthrodesis L4-5 and L5-S1 left using morcellized autograft and allograft. 5. Exploration of fusion and removal of hardware L2-L4  SURGEON:  Sherley Bounds, MD  ASSISTANTS: Dr. Joya Salm   ANESTHESIA:  General  EBL: 200 ml  Total I/O In: 1000 [I.V.:1000] Out: 500 [Urine:300; Blood:200]  BLOOD ADMINISTERED:none  DRAINS: Hemovac   INDICATION FOR PROCEDURE: This patient underwent a previous L2-L4 instrumented fusion. She presented with severe back pain and leg pain. MRI and CT scan showed solid fusions at L2-3 and L3-4. She had adjacent level spondylosis and stenosis L4-5 and L5-S1. She also had positive discography at L4-5 and L5-S1. I recommended decompression and instrumented fusion at L4-5 and L5-S1. Patient understood the risks, benefits, and alternatives and potential outcomes and wished to proceed.  PROCEDURE DETAILS:  The patient was brought to the operating room. After induction of generalized endotracheal anesthesia the patient was rolled into the prone position on chest rolls and all pressure points were padded. The patient's lumbar region was cleaned and then prepped with DuraPrep and draped in the usual sterile fashion. Anesthesia was injected and then a dorsal midline incision was made and carried down to the lumbosacral fascia. The fascia was opened and the  paraspinous musculature was taken down in a subperiosteal fashion to expose L4-5 and L5-S1 as well as the existing hardware. I removed the locking caps and the rods. I then pulled on the screw. I removed the L2 pedicle screws. I replaced the L3 pedicle screw on the right and the L4 pedicle screws bilaterally. I used 6. 5 x 35 mm pedicle screws at L4 and a 5 5 x 35 mm pedicle screw at L3 on the right.. I then turned my attention to the decompression and lumbar hemilaminectomies, hemi- facetectomies, and foraminotomies were performed at 45 and L5-S1 bilaterally. The patient had significant spinal stenosis and this required more work than would be required for a simple exposure of the disc for posterior lumbar interbody fusion. Much more generous decompression was undertaken in order to adequately decompress the neural elements and address the patient's leg pain. The yellow ligament was removed to expose the underlying dura and nerve roots, and generous foraminotomies were performed to adequately decompress the neural elements. Both the exiting and traversing nerve roots were decompressed on both sides until a coronary dilator passed easily along the nerve roots. Once the decompression was complete, I turned my attention to the posterior lower lumbar interbody fusion. The epidural venous vasculature was coagulated and cut sharply. Disc space was incised and the initial discectomy was performed with pituitary rongeurs. The disc space was distracted with sequential distractors to a height of 8 mm. We then used a series of scrapers and shavers to prepare the endplates for fusion. The midline was prepared with Epstein curettes. Once the complete discectomy was finished, we packed an appropriate sized peek interbody cage with local autograft and morcellized allograft, gently retracted the  nerve root, and tapped the cage into position at L4-5 and L5-S1.  The midline between the cages was packed with morselized autograft and  allograft. We then turned our attention to the placement of the lower pedicle screws. The pedicle screw entry zones were identified utilizing surface landmarks and fluoroscopy. I drilled into each pedicle utilizing the hand drill and EMG monitoring, and tapped each pedicle with the appropriate tap. We palpated with a ball probe to assure no break in the cortex. We then placed 6.5 x 35 mm screws into the pedicles bilaterally at S1 and 5.5 x 35 mm cortical pedicle screws at L5. We then decorticated the transverse processes and laid a mixture of morcellized autograft and allograft out over these to perform intertransverse arthrodesis at `L45 and L5-S1 left. We then placed lordotic rods into the multiaxial screw heads of the pedicle screws and locked these in position with the locking caps and anti-torque device. We then checked our construct with AP and lateral fluoroscopy. Irrigated with copious amounts of bacitracin-containing saline solution. Placed a medium Hemovac drain through separate stab incision. Inspected the nerve roots once again to assure adequate decompression, lined to the dura with Gelfoam, and closed the muscle and the fascia with 0 Vicryl. Closed the subcutaneous tissues with 2-0 Vicryl and subcuticular tissues with 3-0 Vicryl. The skin was closed with benzoin and Steri-Strips. Dressing was then applied, the patient was awakened from general anesthesia and transported to the recovery room in stable condition. At the end of the procedure all sponge, needle and instrument counts were correct.   PLAN OF CARE: Admit to inpatient   PATIENT DISPOSITION:  PACU - hemodynamically stable.   Delay start of Pharmacological VTE agent (>24hrs) due to surgical blood loss or risk of bleeding:  yes

## 2015-09-12 ENCOUNTER — Encounter (HOSPITAL_COMMUNITY): Payer: Self-pay | Admitting: Neurological Surgery

## 2015-09-12 NOTE — Evaluation (Signed)
Occupational Therapy Evaluation Patient Details Name: Victoria Mccarthy MRN: [id number] DOB: 1945/12/22 Today's Date: 09/12/2015    History of Present Illness Pt is a 69 y/o female who presents s/p L4-S1 PLIF on 09/11/15. This is the pt's 4th back sx.    Clinical Impression   Pt is performing ADL and ADL transfers at a set up to supervision level with RW and AE.  Pt has all necessary AE and DME at home and is knowledgeable in back precautions.  No further OT needs. Signing off.    Follow Up Recommendations  No OT follow up    Equipment Recommendations  None recommended by OT    Recommendations for Other Services       Precautions / Restrictions Precautions Precautions: Fall;Back Precaution Booklet Issued: Yes (comment) Precaution Comments: pt is knowledgeable in back precautions from multiple surgeries Required Braces or Orthoses: Spinal Brace Spinal Brace: Lumbar corset;Applied in sitting position Restrictions Weight Bearing Restrictions: No      Mobility Bed Mobility Overal bed mobility: Needs Assistance Bed Mobility: Sit to Sidelying;Rolling Rolling: Supervision      Sit to sidelying: Min assist General bed mobility comments: assisted LEs up into bed  Transfers Overall transfer level: Needs assistance Equipment used: Rolling walker (2 wheeled) Transfers: Sit to/from Stand Sit to Stand: Supervision         General transfer comment: from chair and toilet    Balance Overall balance assessment: Needs assistance Sitting-balance support: Feet supported;No upper extremity supported Sitting balance-Leahy Scale: Fair     Standing balance support: During functional activity;Bilateral upper extremity supported Standing balance-Leahy Scale: Poor                              ADL Overall ADL's : Needs assistance/impaired Eating/Feeding: Independent;Sitting   Grooming: Wash/dry hands;Standing;Supervision/safety   Upper Body Bathing: Set  up;Sitting   Lower Body Bathing: Supervison/ safety;With adaptive equipment;Sit to/from stand   Upper Body Dressing : Set up;Sitting Upper Body Dressing Details (indicate cue type and reason): brace included Lower Body Dressing: Supervision/safety;Sit to/from stand;With adaptive equipment   Toilet Transfer: Supervision/safety;Ambulation;Regular Toilet;Grab bars;RW   Toileting- Water quality scientist and Hygiene: Supervision/safety;Sit to/from stand       Functional mobility during ADLs: Supervision/safety;Rolling walker General ADL Comments: Mother and children can assist with IADL.     Vision     Perception     Praxis      Pertinent Vitals/Pain Pain Assessment: 0-10 Pain Score: 4  Pain Location: back Pain Descriptors / Indicators: Sore Pain Intervention(s): Limited activity within patient's tolerance;Monitored during session;Premedicated before session;Repositioned     Hand Dominance Right   Extremity/Trunk Assessment Upper Extremity Assessment Upper Extremity Assessment: Overall WFL for tasks assessed   Lower Extremity Assessment Lower Extremity Assessment: Defer to PT evaluation   Cervical / Trunk Assessment Cervical / Trunk Assessment: Other exceptions Cervical / Trunk Exceptions: s/p surgery. Forward head and rounded shoulders noted.    Communication Communication Communication: No difficulties   Cognition Arousal/Alertness: Awake/alert Behavior During Therapy: WFL for tasks assessed/performed Overall Cognitive Status: Within Functional Limits for tasks assessed                     General Comments       Exercises       Shoulder Instructions      Home Living Family/patient expects to be discharged to:: Private residence Living Arrangements: Parent Available Help at Discharge: Family;Available 24  hours/day Type of Home: House Home Access: Stairs to enter [employer] of Steps: 2 Entrance Stairs-Rails: Right Home Layout: One  level     Bathroom Shower/Tub: Teacher, early years/pre: Standard Bathroom Accessibility: Yes How Accessible: Accessible via walker Home Equipment: Shower seat;Walker - 2 wheels;Adaptive equipment Adaptive Equipment: Reacher;Sock aid;Long-handled sponge Additional Comments: issued pt a long shoe horn as her's broke      Prior Functioning/Environment Level of Independence: Independent with assistive device(s)             OT Diagnosis: Generalized weakness;Acute pain   OT Problem List:     OT Treatment/Interventions:      OT Goals(Current goals can be found in the care plan section) Acute Rehab OT Goals Patient Stated Goal: Feel better  OT Frequency:     Barriers to D/C:            Co-evaluation              End of Session Equipment Utilized During Treatment: Rolling walker;Back brace;Gait belt  Activity Tolerance: Patient tolerated treatment well Patient left: in bed;with call bell/phone within reach   Time: 1225-1248 OT Time Calculation (min): 23 min Charges:  OT General Charges $OT Visit: 1 Procedure OT Evaluation $Initial OT Evaluation Tier I: 1 Procedure OT Treatments $Self Care/Home Management : 8-22 mins G-Codes:    Malka So 09/12/2015, 12:58 PM  [phone]

## 2015-09-12 NOTE — Evaluation (Signed)
Physical Therapy Evaluation Patient Details Name: Victoria Mccarthy MRN: [id number] DOB: 10/22/1945 Today's Date: 09/12/2015   History of Present Illness  Pt is a 69 y/o female who presents s/p L4-S1 PLIF on 09/11/15.   Clinical Impression  Pt admitted with above diagnosis. Pt currently with functional limitations due to the deficits listed below (see PT Problem List). At the time of PT eval pt was able to perform transfers and ambulation with min guard assist and cues for safety. Pt will benefit from skilled PT to increase their independence and safety with mobility to allow discharge to the venue listed below.       Follow Up Recommendations Outpatient PT    Equipment Recommendations  None recommended by PT    Recommendations for Other Services       Precautions / Restrictions Precautions Precautions: Fall;Back Precaution Booklet Issued: Yes (comment) Precaution Comments: Reviewed handout and discussed back precautions during functional mobility.  Required Braces or Orthoses: Spinal Brace Spinal Brace: Lumbar corset;Applied in sitting position Restrictions Weight Bearing Restrictions: No      Mobility  Bed Mobility Overal bed mobility: Needs Assistance Bed Mobility: Rolling;Sidelying to Sit Rolling: Supervision Sidelying to sit: Supervision;Min guard       General bed mobility comments: Close guard for safety as pt elevated trunk to full sitting position. Increased time and use of bed rails required.   Transfers Overall transfer level: Needs assistance Equipment used: Rolling walker (2 wheeled) Transfers: Sit to/from Stand Sit to Stand: Min guard         General transfer comment: VC's for hand placement on seated surface for safety.   Ambulation/Gait Ambulation/Gait assistance: Min guard;Supervision Ambulation Distance (Feet): 400 Feet Assistive device: Rolling walker (2 wheeled) Gait Pattern/deviations: Step-through pattern;Decreased stride length;Trunk  flexed Gait velocity: Decreased Gait velocity interpretation: Below normal speed for age/gender General Gait Details: Pt was able to ambulate with RW and close guard for safety initially. Progressed to supervision level by end of gait training. VC's for improved posture and walker placement closer to pt's body.   Stairs            Wheelchair Mobility    Modified Rankin (Stroke Patients Only)       Balance Overall balance assessment: Needs assistance Sitting-balance support: Feet supported;No upper extremity supported Sitting balance-Leahy Scale: Fair     Standing balance support: During functional activity;Bilateral upper extremity supported Standing balance-Leahy Scale: Poor                               Pertinent Vitals/Pain Pain Assessment: 0-10 Pain Score: 4  Pain Location: Incision Pain Descriptors / Indicators: Operative site guarding;Discomfort Pain Intervention(s): Limited activity within patient's tolerance;Monitored during session;Repositioned    Home Living Family/patient expects to be discharged to:: Private residence Living Arrangements: Alone Available Help at Discharge: Family;Available 24 hours/day Type of Home: House Home Access: Stairs to enter Entrance Stairs-Rails: Right (grab bar) Entrance Stairs-Number of Steps: 2 Home Layout: One level Home Equipment: Clinical cytogeneticist - 2 wheels      Prior Function Level of Independence: Independent with assistive device(s)               Hand Dominance   Dominant Hand: Right    Extremity/Trunk Assessment   Upper Extremity Assessment: Defer to OT evaluation           Lower Extremity Assessment: Generalized weakness      Cervical / Trunk Assessment:  Other exceptions  Communication   Communication: No difficulties  Cognition Arousal/Alertness: Awake/alert Behavior During Therapy: WFL for tasks assessed/performed Overall Cognitive Status: Within Functional Limits for  tasks assessed                      General Comments      Exercises        Assessment/Plan    PT Assessment Patient needs continued PT services  PT Diagnosis Difficulty walking;Acute pain   PT Problem List Decreased strength;Decreased activity tolerance;Decreased range of motion;Decreased balance;Decreased mobility;Decreased knowledge of use of DME;Decreased safety awareness;Decreased knowledge of precautions;Pain  PT Treatment Interventions DME instruction;Gait training;Stair training;Functional mobility training;Therapeutic activities;Therapeutic exercise;Neuromuscular re-education;Patient/family education   PT Goals (Current goals can be found in the Care Plan section) Acute Rehab PT Goals Patient Stated Goal: Feel better PT Goal Formulation: With patient Time For Goal Achievement: 09/19/15 Potential to Achieve Goals: Good    Frequency Min 5X/week   Barriers to discharge        Co-evaluation               End of Session Equipment Utilized During Treatment: Back brace Activity Tolerance: Patient tolerated treatment well Patient left: in chair;with call bell/phone within reach;Other (comment) (Staff present) Nurse Communication: Mobility status         Time: [phone] PT Time Calculation (min) (ACUTE ONLY): 22 min   Charges:   PT Evaluation $Initial PT Evaluation Tier I: 1 Procedure     PT G Codes:        Rolinda Roan 09-18-2015, 11:12 AM   Rolinda Roan, PT, DPT Acute Rehabilitation Services Pager: [phone]

## 2015-09-12 NOTE — Progress Notes (Signed)
Patient ID: Victoria Mccarthy, female   DOB: 03-21-1946, 69 y.o.   MRN: [id number] Subjective: Patient reports she is doing well. She has appropriate back soreness. Mild aching in the legs when walking. No numbness tingling or weakness.  Objective: Vital signs in last 24 hours: Temp:  [97.6 F (36.4 C)-100.1 F (37.8 C)] 97.6 F (36.4 C) (12/23 1431) Pulse Rate:  [82-114] 82 (12/23 1431) Resp:  [14-25] 18 (12/23 1431) BP: (98-130)/(48-89) 102/49 mmHg (12/23 1431) SpO2:  [91 %-98 %] 96 % (12/23 1431)  Intake/Output from previous day: 12/22 0701 - 12/23 0700 In: 1800 [I.V.:1800] Out: 2720 [Urine:2300; Drains:220; Blood:200] Intake/Output this shift: Total I/O In: 600 [P.O.:600] Out: -   Neurologic: Grossly normal  Lab Results: Lab Results  Component Value Date   WBC 4.9 09/03/2015   HGB 13.0 09/03/2015   HCT 39.5 09/03/2015   MCV 94.5 09/03/2015   PLT 184 09/03/2015   Lab Results  Component Value Date   INR 0.94 09/03/2015   BMET Lab Results  Component Value Date   NA 138 09/03/2015   K 3.9 09/03/2015   CL 104 09/03/2015   CO2 26 09/03/2015   GLUCOSE 175* 09/03/2015   BUN 12 09/03/2015   CREATININE 0.79 09/03/2015   CALCIUM 9.4 09/03/2015    Studies/Results: Dg Lumbar Spine 2-3 Views  09/11/2015  CLINICAL DATA:  Lumbar fusion L4-5 and L5-S1. EXAM: DG C-ARM 61-120 MIN; LUMBAR SPINE - 2-3 VIEW COMPARISON:  CT lumbar 07/16/2015 FINDINGS: AP and lateral views were obtained with C-arm equipment. Bilateral pedicle screw and posterior rod fusion extends from L3 through S1. Interbody spacers are present at L2-3, L3-4, L4-5, and L5-S1. Interval placement of screws at L5 and S1 since the prior study. Interval removal of L2 screws since the prior study. Satisfactory alignment.  No acute abnormality IMPRESSION: Extension of fusion now extending from L3 through S1. Removal of L2 hardware. Electronically Signed   By: Franchot Gallo M.D.   On: 09/11/2015 15:57   Dg C-arm 1-60  Min  09/11/2015  CLINICAL DATA:  Lumbar fusion L4-5 and L5-S1. EXAM: DG C-ARM 61-120 MIN; LUMBAR SPINE - 2-3 VIEW COMPARISON:  CT lumbar 07/16/2015 FINDINGS: AP and lateral views were obtained with C-arm equipment. Bilateral pedicle screw and posterior rod fusion extends from L3 through S1. Interbody spacers are present at L2-3, L3-4, L4-5, and L5-S1. Interval placement of screws at L5 and S1 since the prior study. Interval removal of L2 screws since the prior study. Satisfactory alignment.  No acute abnormality IMPRESSION: Extension of fusion now extending from L3 through S1. Removal of L2 hardware. Electronically Signed   By: Franchot Gallo M.D.   On: 09/11/2015 15:57    Assessment/Plan: Doing very well. Continue to mobilize and hopefully home tomorrow.   LOS: 1 day    Asher Torpey S 09/12/2015, 3:27 PM

## 2015-09-12 NOTE — Care Management Note (Signed)
Case Management Note  Patient Details  Name: NATACHA IDEKER MRN: [id number] Date of Birth: 04/21/1946  Subjective/Objective:                    Action/Plan: Patient was admitted for a PLIF. Lives at home alone. Will follow for discharge needs pending PT/OT evals and physician orders.  Expected Discharge Date:                  Expected Discharge Plan:     In-House Referral:     Discharge planning Services     Post Acute Care Choice:    Choice offered to:     DME Arranged:    DME Agency:     HH Arranged:    HH Agency:     Status of Service:  In process, will continue to follow  Medicare Important Message Given:    Date Medicare IM Given:    Medicare IM give by:    Date Additional Medicare IM Given:    Additional Medicare Important Message give by:     If discussed at [place] of Stay Meetings, dates discussed:    Additional Comments:  Rolm Baptise, RN 09/12/2015, 11:39 AM [phone]

## 2015-09-13 MED ORDER — METHOCARBAMOL 750 MG PO TABS: 750.0000 mg | ORAL_TABLET | Freq: Three times a day (TID) | ORAL | Status: DC | PRN

## 2015-09-13 MED ORDER — OXYCODONE-ACETAMINOPHEN 5-325 MG PO TABS: 1.0000 | ORAL_TABLET | ORAL | Status: DC | PRN

## 2015-09-13 NOTE — Progress Notes (Signed)
Physical Therapy Treatment Patient Details Name: Victoria Mccarthy MRN: [id number] DOB: 1945/12/05 Today's Date: 09/13/2015    History of Present Illness Pt is a 69 y/o female who presents s/p L4-S1 PLIF on 09/11/15.     PT Comments    Pt progressing well with mobility and from a mobility standpoint should be ready for DC today.  I have encouraged the patient to gradually increase activity daily to tolerance.  Answered all questions related to mobility.     Follow Up Recommendations  Outpatient PT (If recommended by Surgeon)     Equipment Recommendations  None recommended by PT    Recommendations for Other Services       Precautions / Restrictions Precautions Precautions: Fall;Back Precaution Booklet Issued: Yes (comment) Precaution Comments: Reviewed handout and discussed back precautions during functional mobility.  Required Braces or Orthoses: Spinal Brace Spinal Brace: Lumbar corset;Applied in sitting position Restrictions Weight Bearing Restrictions: No    Mobility  Bed Mobility Overal bed mobility: Needs Assistance Bed Mobility: Sit to Supine       Sit to supine: Supervision   General bed mobility comments: Used rail. Demonstrates proper log rolling  Transfers Overall transfer level: Needs assistance Equipment used: Rolling walker (2 wheeled) Transfers: Sit to/from Stand Sit to Stand: Supervision         General transfer comment: VC's for hand placement on seated surface for safety.   Ambulation/Gait Ambulation/Gait assistance: Supervision Ambulation Distance (Feet): 400 Feet Assistive device: Rolling walker (2 wheeled) Gait Pattern/deviations: Step-through pattern;Decreased stride length Gait velocity: Decreased   General Gait Details: Demonstrated good technique with RW use   Stairs Stairs: Yes Stairs assistance: Supervision Stair Management: One rail Right;Alternating pattern;Forwards Number of Stairs: 3 General stair comments: no  difficulties  Wheelchair Mobility    Modified Rankin (Stroke Patients Only)       Balance                                    Cognition Arousal/Alertness: Awake/alert Behavior During Therapy: WFL for tasks assessed/performed Overall Cognitive Status: Within Functional Limits for tasks assessed                      Exercises      General Comments General comments (skin integrity, edema, etc.): Pt used regular commode during session. Reports she will have plenty of assistance at home.        Pertinent Vitals/Pain Pain Assessment: 0-10 Pain Score: 5  Pain Location: low back Pain Descriptors / Indicators: Sore Pain Intervention(s): Limited activity within patient's tolerance;Monitored during session    Home Living                      Prior Function            PT Goals (current goals can now be found in the care plan section) Acute Rehab PT Goals Patient Stated Goal: Feel better Progress towards PT goals: Progressing toward goals    Frequency  Min 5X/week    PT Plan Current plan remains appropriate    Co-evaluation             End of Session Equipment Utilized During Treatment: Back brace;Gait belt Activity Tolerance: Patient tolerated treatment well Patient left: with call bell/phone within reach;in bed     Time: 0750-0810 PT Time Calculation (min) (ACUTE ONLY): 20 min  Charges:  $Gait Training:  8-22 mins                    G Codes:      Melvern Banker 13-Oct-2015, 8:17 AM  Lavonia Dana, PT  [phone] 10-13-15

## 2015-09-13 NOTE — Discharge Summary (Signed)
Physician Discharge Summary  Patient ID: Victoria Mccarthy MRN: [id number] DOB/AGE: [birth year] 67 y.o.  Admit date: 09/11/2015 Discharge date: 09/13/2015  Admission Diagnoses:  Lumbar spondylosis and stenosis  Discharge Diagnoses:  Lumbar spondylosis and stenosis Active Problems:   S/P lumbar spinal fusion   Discharged Condition: good  Hospital Course: Patient admitted by Dr. Ronnald Ramp who performed a lumbar decompression and arthrodesis. She is doing well following surgery. She is up and ambulating actively with a rolling walker. She is asking to be discharged to home. Her drain was removed and a small bandage placed over the drain site, which she is to have removed in 2 days. The actual incision is healing nicely. There is no erythema, swelling, or drainage. She's been given instructions regarding wound care and activities following discharge. She is already scheduled follow-up with Dr. Ronnald Ramp about a week and a half.  Discharge Exam: Blood pressure 100/47, pulse 76, temperature 97.9 F (36.6 C), temperature source Oral, resp. rate 18, weight 86.637 kg (191 lb), SpO2 94 %.  Disposition: 01-Home or Self Care     Medication List    TAKE these medications        ALPRAZolam 1 MG tablet  Commonly known as:  XANAX  Take 1 tablet (1 mg total) by mouth 4 (four) times daily.     atorvastatin 10 MG tablet  Commonly known as:  LIPITOR  Take 10 mg by mouth at bedtime.     cholestyramine 4 GM/DOSE powder  Commonly known as:  QUESTRAN  Take 1 packet (4 g total) by mouth daily. Do not take within 2 hours of other medications     FLUoxetine 40 MG capsule  Commonly known as:  PROZAC  Take 1 capsule (40 mg total) by mouth 2 (two) times daily.     methocarbamol 750 MG tablet  Commonly known as:  ROBAXIN  TAKE 1 TABLET BY ORAL ROUTE EVERY 8 HOURS AS NEEDED     methocarbamol 750 MG tablet  Commonly known as:  ROBAXIN  Take 1 tablet (750 mg total) by mouth every 8 (eight) hours as needed for  muscle spasms.     multivitamin with minerals Tabs tablet  Take 1 tablet by mouth every evening.     MYRBETRIQ 50 MG Tb24 tablet  Generic drug:  mirabegron ER  Take 50 mg by mouth daily.     nitroGLYCERIN 0.4 MG SL tablet  Commonly known as:  NITROSTAT  Place 0.4 mg under the tongue every 5 (five) minutes as needed for chest pain.     OLANZapine 15 MG tablet  Commonly known as:  ZYPREXA  Take 1 tablet (15 mg total) by mouth at bedtime.     oxyCODONE-acetaminophen 5-325 MG tablet  Commonly known as:  PERCOCET/ROXICET  Take 1 tablet by mouth every 6 (six) hours as needed.     oxyCODONE-acetaminophen 5-325 MG tablet  Commonly known as:  PERCOCET/ROXICET  Take 1-2 tablets by mouth every 4 (four) hours as needed (pain).     rOPINIRole 3 MG tablet  Commonly known as:  REQUIP  TAKE 1 TABLET BY MOUTH 3 TIMES DAILY.         SignedHosie Spangle 09/13/2015, 12:50 PM

## 2015-09-13 NOTE — Progress Notes (Signed)
Pt discharging at this time taking all personal belongings. Discharge instructions with prescriptions provided with verbal understanding.

## 2015-09-14 NOTE — Anesthesia Postprocedure Evaluation (Signed)
Anesthesia Post Note  Patient: Victoria Mccarthy  Procedure(s) Performed: Procedure(s) (LRB): LUMBAR FOUR-FIVE LUMBAR FIVE SACRAL ONE  MAXIMUM ACCESS SURGERY, POSTERIOR LUMBAR INTERBODY FUSION , Removal of LUMBAR TWO TO LUMBAR FOUR  HARDWARE (N/A)  Patient location during evaluation: PACU Anesthesia Type: General Level of consciousness: awake Pain management: pain level controlled Vital Signs Assessment: post-procedure vital signs reviewed and stable Respiratory status: spontaneous breathing Cardiovascular status: stable Postop Assessment: no signs of nausea or vomiting Anesthetic complications: no    Last Vitals:  Filed Vitals:   09/13/15 0946 09/13/15 1315  BP: 100/47 100/43  Pulse: 76 81  Temp: 36.6 C 36.5 C  Resp: 18 18    Last Pain:  Filed Vitals:   09/13/15 1349  PainSc: 6                  Keaun Schnabel

## 2015-09-16 MED FILL — Sodium Chloride IV Soln 0.9%: INTRAVENOUS | Qty: 1000 | Status: AC

## 2015-09-16 MED FILL — Heparin Sodium (Porcine) Inj 1000 Unit/ML: INTRAMUSCULAR | Qty: 30 | Status: AC

## 2015-09-17 ENCOUNTER — Ambulatory Visit (HOSPITAL_COMMUNITY): Payer: Self-pay | Admitting: Psychiatry

## 2015-09-30 ENCOUNTER — Ambulatory Visit: Payer: Self-pay | Admitting: Cardiovascular Disease

## 2015-09-30 ENCOUNTER — Ambulatory Visit (HOSPITAL_COMMUNITY): Payer: Self-pay | Admitting: Psychiatry

## 2015-10-06 ENCOUNTER — Encounter (HOSPITAL_COMMUNITY): Payer: Self-pay | Admitting: Psychiatry

## 2015-10-06 ENCOUNTER — Ambulatory Visit (INDEPENDENT_AMBULATORY_CARE_PROVIDER_SITE_OTHER): Payer: Medicare Other | Admitting: Psychiatry

## 2015-10-06 VITALS — BP 123/87 | HR 83 | Ht 65.5 in | Wt 190.0 lb

## 2015-10-06 DIAGNOSIS — F3130 Bipolar disorder, current episode depressed, mild or moderate severity, unspecified: Secondary | ICD-10-CM

## 2015-10-06 MED ORDER — FLUOXETINE HCL 40 MG PO CAPS: 40.0000 mg | ORAL_CAPSULE | Freq: Two times a day (BID) | ORAL | Status: DC

## 2015-10-06 MED ORDER — ALPRAZOLAM 1 MG PO TABS
1.0000 mg | ORAL_TABLET | Freq: Four times a day (QID) | ORAL | Status: DC
Start: 2015-10-06 — End: 2015-12-05

## 2015-10-06 MED ORDER — OLANZAPINE 15 MG PO TABS: 15.0000 mg | ORAL_TABLET | Freq: Every day | ORAL | Status: DC

## 2015-10-06 NOTE — Progress Notes (Signed)
Patient ID: ANGELITA PHARRIS, female   DOB: [birth year], 70 y.o.   MRN: [id number] Patient ID: KEIYANA STUFFLEBEAM, female   DOB: 1946-04-21, 70 y.o.   MRN: [id number] Patient ID: BRIANNON HYLER, female   DOB: 1945/10/23, 70 y.o.   MRN: [id number] Patient ID: ARYAL JACOBOWITZ, female   DOB: [birth year], 70 y.o.   MRN: [id number] Patient ID: MYSTICAL STORZ, female   DOB: 1945/09/30, 70 y.o.   MRN: [id number] Patient ID: TANIS BRECKER, female   DOB: [birth year], 70 y.o.   MRN: [id number]  Psychiatric Assessment Adult  Patient Identification:  Victoria Mccarthy Date of Evaluation:  10/06/2015 Chief Complaint: "I am tired History of Chief Complaint:   Chief Complaint  Patient presents with  . Manic Behavior  . Depression  . Anxiety  . Follow-up    Depression        Associated symptoms include decreased concentration and appetite change.  Past medical history includes anxiety.   Anxiety Symptoms include decreased concentration and nervous/anxious behavior.     this patient is a 70 year old divorced white female who lives alone in [place]. She has one son, one daughter, 6 grandchildren 3 great-grandchildren. 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  The patient states that she's had problems with mental illness since her 28s. She was hospitalized at [facility] in [facility] twice in the 1980s. During these times she was depressed extremely anxious and having paranoid delusions that she had killed someone and that the police were after her. At one point she was hearing voices telling her to hurt her self. She finally started seeing Dr. Charmayne Sheer private practitioner in [place] who treated her for years. In the past she's been on Prozac, Symbyax, Xanax, Valium, BuSpar and lithium.  After her psychiatrist retired, she began going to the [facility] but got tired of waiting for hours for recheck visit. She most recently she's been going to [place] [facility]. The  psychiatrist there has cut down her medication. She used to be on Symbiax 6/50-2 a day and she was cut down to 4-25, once a day. Since this happened she become extremely anxious and depressed. At the beginning of this year however her insurance would not pay for the medicine so she's been off it entirely and she is getting even worse. The physician also cut her Xanax down from 1 mg 4 times a day to 0.5 mg twice a day and she is exceedingly anxious.  Currently the patient states that she is depressed all the time, very anxious and tearful, irritable and angry. She can't stand to be around people and stays by herself with her dogs. She's trying to spend some time sewing but it's very hard for her to concentrate. Her sleep is poor and her mind races. She is again having the delusion that she may have killed someone at the police may be coming to get her. She has nightmares about this. It's hard for her to sit still. She does not have any direct thoughts of hurting self or others. She constantly picks at her fingernails. She denies any current auditory or visual hallucinations  The patient returns after 2 months. She had back surgery last month and is still recovering. She states between the anesthesia as Xanax Robaxin and narcotic pain reliever she feels very spaced out and forgetful. She was able to stop the narcotic a few days ago. I told her we would need to  look at cutting back on Xanax when she is recovered from her surgery. Her mood has been stable and she is sleeping fairly well. She denies any manic symptoms or serious depression Review of Systems  Constitutional: Positive for activity change, appetite change and unexpected weight change.  HENT: Negative.   Eyes: Negative.   Respiratory: Negative.   Cardiovascular: Negative.   Gastrointestinal: Positive for abdominal pain.  Endocrine: Negative.   Genitourinary: Negative.   Musculoskeletal: Positive for back pain, arthralgias and neck pain.  Skin:  Negative.   Allergic/Immunologic: Negative.   Hematological: Negative.   Psychiatric/Behavioral: Positive for depression, hallucinations, sleep disturbance, dysphoric mood, decreased concentration and agitation. The patient is nervous/anxious.    Physical Exam not done  Depressive Symptoms: depressed mood, anhedonia, insomnia, psychomotor retardation, fatigue, feelings of worthlessness/guilt, difficulty concentrating, hopelessness, anxiety, panic attacks, weight loss, decreased appetite,  (Hypo) Manic Symptoms:   Elevated Mood:  no Irritable Mood:  Yes Grandiosity:  No Distractibility:  Yes Labiality of Mood:  Yes Delusions:  Yes Hallucinations:  No Impulsivity:  No Sexually Inappropriate Behavior:  No Financial Extravagance:  No Flight of Ideas:  No  Anxiety Symptoms: Excessive Worry:  Yes Panic Symptoms:  Yes Agoraphobia:  Yes Obsessive Compulsive: Yes  Symptoms: Picks at nails Specific Phobias:  No Social Anxiety:  Yes  Psychotic Symptoms:  Hallucinations: No None Delusions:  Yes Paranoia:  Yes   Ideas of Reference:  No  PTSD Symptoms: Ever had a traumatic exposure:  No Had a traumatic exposure in the last month:  No Re-experiencing: No None Hypervigilance:  No Hyperarousal: No None Avoidance: No None  Traumatic Brain Injury: Yes Sports Related  Past Psychiatric History: Diagnosis: Bipolar disorder   Hospitalizations: Twice in the 1980s   Outpatient Care: At [facility], mental [place], with private practitioner   Substance Abuse Care: none  Self-Mutilation:none  Suicidal Attempts: none  Violent Behaviors: none   Past Medical History:   Past Medical History  Diagnosis Date  . PONV (postoperative nausea and vomiting)   . Arthritis     chronic back pain  . Diarrhea     incontinent stools  . Nocturia   . Urinary frequency   . Anemia   . Depression   . Psychotic affective disorder ([place])     "psychotic delusions" "I cut  myself"   . Restless leg syndrome   . Rheumatic fever   . MVP (mitral valve prolapse)   . Bipolar disorder ([place])   . Osteoporosis   . Osteoarthritis   . Diverticulitis     treated at least 5 times in past, hospitalized twice  . Cancer [facility]) Jan 2013    kidney cancer, left, s/p partial nephrectomy  . Heart murmur   . GERD (gastroesophageal reflux disease)     esophageal spasms - takes nitroglycerin for this  . Anxiety   . Neuropathy (H[facility])    History of Loss of Consciousness:  No Seizure History:  No Cardiac History:  No Allergies:   Allergies  Allergen Reactions  . Shellfish Allergy Anaphylaxis  . Neurontin [Gabapentin]     Unable to talk when takes medication  . Vicodin [Hydrocodone-Acetaminophen] Itching  . Codeine Itching     itching  . Haldol [Haloperidol Lactate] Other (See Comments)    Muscle spasms  . Hydromorphone Hcl Nausea And Vomiting  . Latex Rash  . Propoxyphene N-Acetaminophen Nausea And Vomiting   Current Medications:  Current Outpatient Prescriptions  Medication Sig Dispense Refill  .  ALPRAZolam (XANAX) 1 MG tablet Take 1 tablet (1 mg total) by mouth 4 (four) times daily. 120 tablet 2  . atorvastatin (LIPITOR) 10 MG tablet Take 10 mg by mouth at bedtime.  1  . cholestyramine (QUESTRAN) 4 GM/DOSE powder Take 1 packet (4 g total) by mouth daily. Do not take within 2 hours of other medications 378 g 5  . FLUoxetine (PROZAC) 40 MG capsule Take 1 capsule (40 mg total) by mouth 2 (two) times daily. 60 capsule 2  . methocarbamol (ROBAXIN) 750 MG tablet Take 1 tablet (750 mg total) by mouth every 8 (eight) hours as needed for muscle spasms. 90 tablet 0  . mirabegron ER (MYRBETRIQ) 50 MG TB24 tablet Take 50 mg by mouth daily.    . Multiple Vitamin (MULTIVITAMIN WITH MINERALS) TABS Take 1 tablet by mouth every evening.    . nitroGLYCERIN (NITROSTAT) 0.4 MG SL tablet Place 0.4 mg under the tongue every 5 (five) minutes as needed for chest pain.    Marland Kitchen OLANZapine  (ZYPREXA) 15 MG tablet Take 1 tablet (15 mg total) by mouth at bedtime. 90 tablet 3  . oxyCODONE-acetaminophen (PERCOCET/ROXICET) 5-325 MG tablet Take 1-2 tablets by mouth every 4 (four) hours as needed (pain). 80 tablet 0  . rOPINIRole (REQUIP) 3 MG tablet TAKE 1 TABLET BY MOUTH 3 TIMES DAILY.  11  . methocarbamol (ROBAXIN) 750 MG tablet Reported on 10/06/2015  1  . oxyCODONE-acetaminophen (PERCOCET/ROXICET) 5-325 MG tablet Take 1 tablet by mouth every 6 (six) hours as needed. Reported on 10/06/2015  0   No current facility-administered medications for this visit.    Previous Psychotropic Medications:  Medication Dose   Prozac, Symbyax, Xanax, Valium, BuSpar, lithium                        Substance Abuse History in the last 12 months: Substance Age of 1st Use Last Use Amount Specific Type  Nicotine      Alcohol      Cannabis      Opiates      Cocaine      Methamphetamines      LSD      Ecstasy      Benzodiazepines      Caffeine      Inhalants      Others:                          Medical Consequences of Substance Abuse: none  Legal Consequences of Substance Abuse: none  Family Consequences of Substance Abuse: none  Blackouts:  No DT's:  No Withdrawal Symptoms:  No None  Social History: Current Place of Residence: [place] of Birth: [place] Family Members: 2 children, several grandchildren Marital Status:  Divorced Children:   Sons: 1  Daughters: 1 Relationships: No friends Education:  Dentist Problems/Performance:  Religious Beliefs/Practices: none History of Abuse: none Pensions consultant; Artist, Catering manager History:  None. Legal History: none Hobbies/Interests: TV, sewing  Family History:   Family History  Problem Relation Age of Onset  . Colon cancer Neg Hx   . Bipolar disorder Daughter     Mental Status Examination/Evaluation: Objective:  Appearance: Neat and Well  Groomed  Engineer, water::  Fair  Speech:  Slow   Volume:  Normal  Mood: Tired   Affect a bit blunted   Thought Process:  Goal Directed  Orientation:  Full (Time, Place,  and Person)  Thought Content:  normal  Suicidal Thoughts:  No  Homicidal Thoughts:  No  Judgement:  Fair  Insight:  Fair  Psychomotor Activity:  Normal   Akathisia:  No  Handed:  Right  AIMS (if indicated):    Assets:  Communication Skills Desire for Improvement Resilience Social Support Talents/Skills    Laboratory/X-Ray Psychological Evaluation(s)   Labs reviewed in the chart are unremarkable      Assessment:  Axis I: Bipolar, mixed  AXIS I Bipolar, mixed  AXIS II Deferred  AXIS III Past Medical History  Diagnosis Date  . PONV (postoperative nausea and vomiting)   . Arthritis     chronic back pain  . Diarrhea     incontinent stools  . Nocturia   . Urinary frequency   . Anemia   . Depression   . Psychotic affective disorder ([place])     "psychotic delusions" "I cut myself"   . Restless leg syndrome   . Rheumatic fever   . MVP (mitral valve prolapse)   . Bipolar disorder ([place])   . Osteoporosis   . Osteoarthritis   . Diverticulitis     treated at least 5 times in past, hospitalized twice  . Cancer [facility]) Jan 2013    kidney cancer, left, s/p partial nephrectomy  . Heart murmur   . GERD (gastroesophageal reflux disease)     esophageal spasms - takes nitroglycerin for this  . Anxiety   . Neuropathy ([place])      AXIS IV other psychosocial or environmental problems  AXIS V 41-50 serious symptoms   Treatment Plan/Recommendations:  Plan of Care: Medication management   Laboratory:  As noted in history of present illness   Psychotherapy: She is seeing Tera Mater here     She will will continue Prozac 80 mg every morning for depression and continue Zyprexa  15 mg daily at bedtime for her bipolar disorder and delusions. She will continue Xanax 1 mg 4 times a day for anxiety or now   Routine PRN  Medications:  No  Consultations:   Safety Concerns:  She denies thoughts of hurting self or others   Other:  She'll return in 2 months     Levonne Spiller, MD 1/16/20174:25 PM

## 2015-10-23 ENCOUNTER — Ambulatory Visit (HOSPITAL_COMMUNITY): Payer: Self-pay | Admitting: Psychology

## 2015-11-07 IMAGING — RF DG DISKOGRAPHY LUMBAR S+I
8 series · 8 of 8 positions shown · non-contrast
Comparison: none

CLINICAL DATA: Low back pain.  RIGHT hip and leg pain.
TECHNIQUE: Contiguous axial images were obtained from the inferior aspect of L2
through S1. Coronal and sagittal reconstructions of the disc spaces
were created.

[Series 2: discogram · 1 of 1 slices shown (1 of 8)]
[im 1/1]
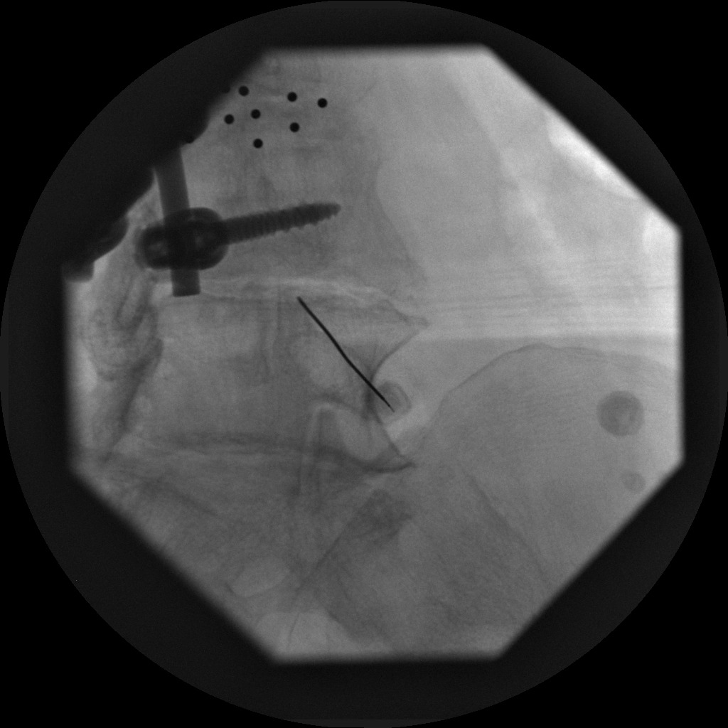

[Series 3: discogram · 1 of 1 slices shown (2 of 8)]
[im 1/1]
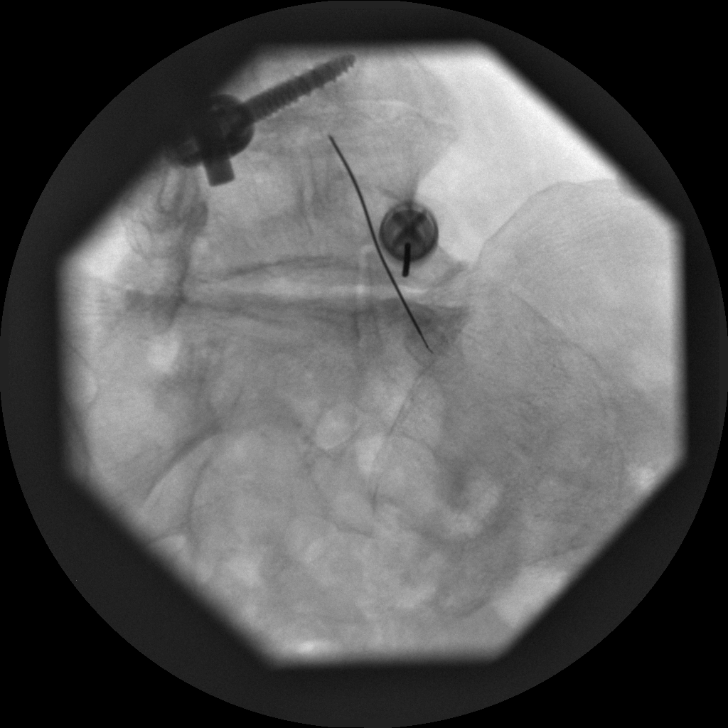

[Series 5: discogram · 1 of 1 slices shown (3 of 8)]
[im 1/1]
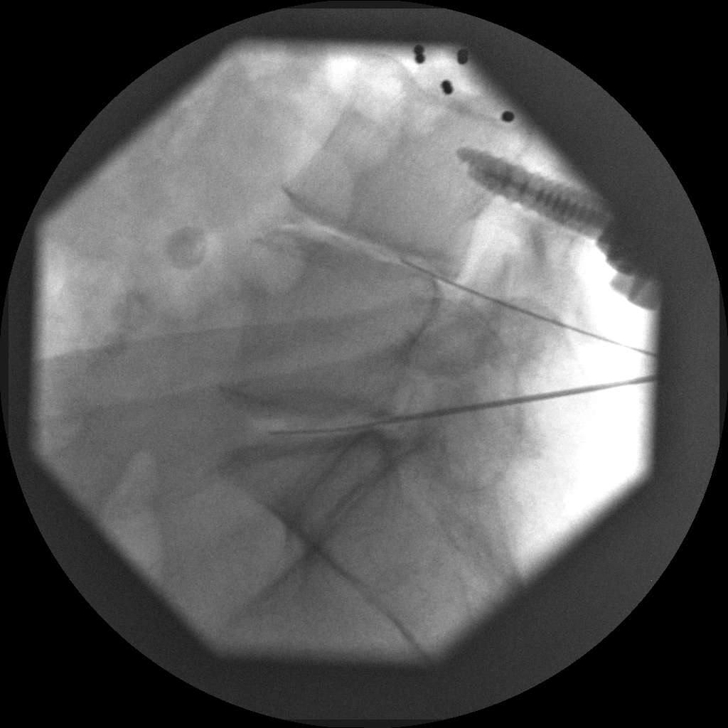

[Series 6: discogram · 1 of 1 slices shown (4 of 8)]
[im 1/1]
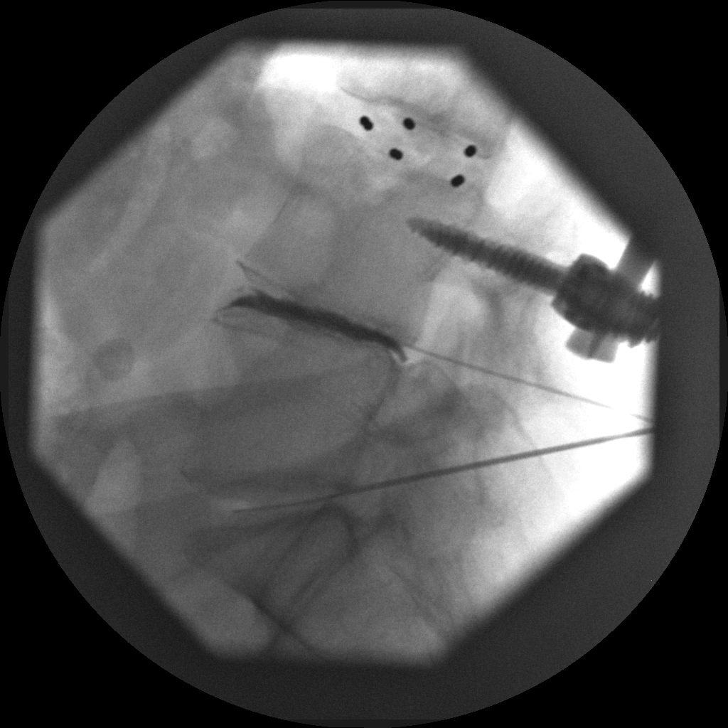

[Series 7: discogram · 1 of 1 slices shown (5 of 8)]
[im 1/1]
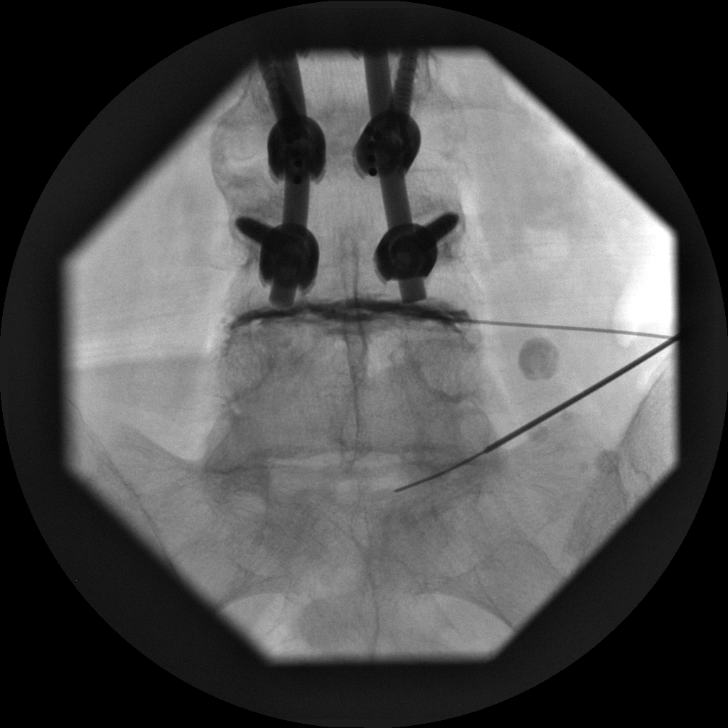

[Series 8: discogram · 1 of 1 slices shown (6 of 8)]
[im 1/1]
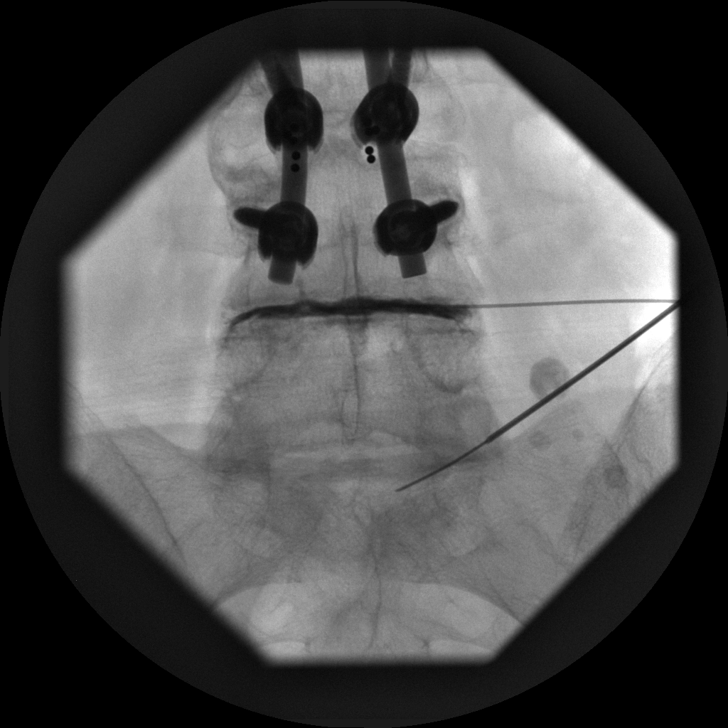

[Series 9: discogram · 1 of 1 slices shown (7 of 8)]
[im 1/1]
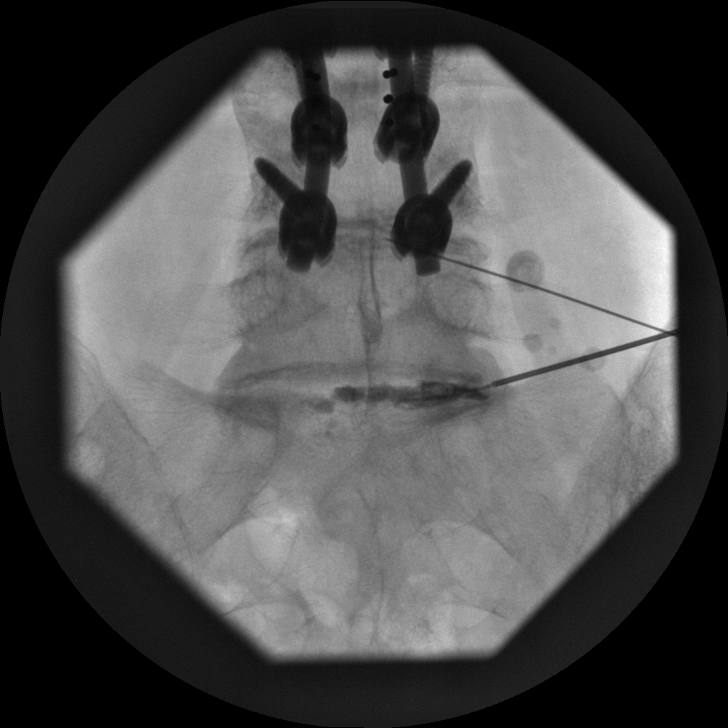

[Series 10: discogram · 1 of 1 slices shown (8 of 8)]
[im 1/1]
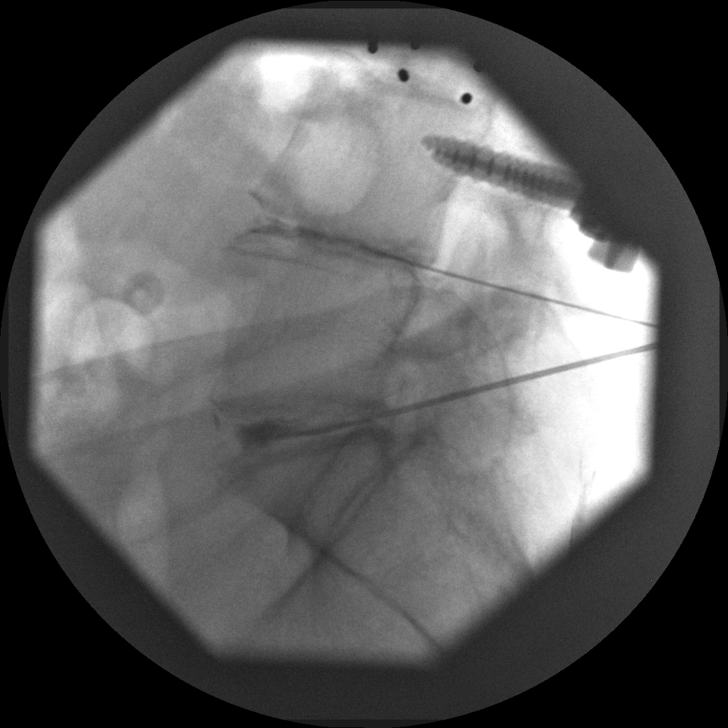

[8 of 8 positions shown; findings below may reference images not displayed]

EXAM:
LUMBAR DISCOGRAM L4-5 and L5-S1.

FLUOROSCOPY TIME:  1 minutes and 40 seconds.  Ten spot films.

PROCEDURE:
After a thorough discussion of risks and benefits of the procedure,
written and oral informed consent was obtained. Specific risks
included discitis, infection of soft tissue and/or bone, and
nontarget injection. Accelerated progression of degenerative disk
disease was also discussed. General risks of the procedure including
bleeding, infection, and injury to nerves, blood vessels, and
adjacent structures. Verbal consent was obtained myself prior to the
procedure.

Discography was performed via a RIGHT posterior oblique approach.
Prior to prep and draped in the usual sterile fashion, the entry
sites were marked using fluoroscopy. Subsequently, using stringent
sterile technique, the back was prepped and draped. 1 gram IV
Cephazolin as an IV antibiotic was completed 30 minutes prior to
needle stick. 1 mL Antibiotic was also mixed with [id number] used
for disc injection.

All elements of maximum barrier sterile technique were employed
(cap, mask, sterile gown and gloves, large sterile sheet, hand
hygeine, betadine scrub or alternative skin antisepsis). After local
anesthesia was provided with 1% lidocaine without epinephrine to the
skin and deeper tissues spinal needles were inserted. Under
meticulous sterile technique, 15, 15, 20 cm 22-gauge needles were
advanced fluoroscopically into the disc spaces of L4-L5 using
intermittent fluoroscopy. A coaxial technique was performed at L5-S1
using 18 gauge spinal needle through which Broughton was placed. Once
satisfactory needle position was achieved, contrast was injected.

L4-L5: Total volume injected: 2.0 mL. Opening pressure was 5 PSI.
Pressure endpoint 10 PSI. Pain reported [DATE] (1 - 10 scale).
Sensation described as severe back and RIGHT leg pain. Pattern of
opacification showed diffuse discal degeneration. Representative
images obtained in AP and lateral projections.

L5-S1: Total volume injected: 2.0 mL. Opening pressure was 10 PSI.
Pressure endpoint 10 PSI. Pain reported [DATE] (1 - 10 scale).
Sensation described as severe low back and RIGHT hip pain.. Pattern
of opacification showed diffuse discal degeneration. Representative
images obtained in AP and lateral projections.

Sedation was administered with 1.0 mg of intravenous Versed, 100 mcg
Fentanyl IV. Toradol 30 mg

CT POST-DISCOGRAM
FINDINGS: Minor atheromatous change. No aneurysmal dilatation of the aorta.
Partial LEFT nephrectomy.

L2-L3: There is loosening of the L2 and L3 screws but there appears
to be developing interbody bridging across the cages. No residual
impingement.

L3-L4: There is loosening of the L3 and L4 screws but there appears
to be developing interbody bridging across the cages. No residual
impingement.

L4-L5: Severe disc space narrowing. New central and rightward
extrusion with vacuum phenomenon. Cephalad and particularly caudad
migration of vacuum phenomenon and associated disc material on the
RIGHT. Mild facet arthropathy. RIGHT subarticular zone and foraminal
zone narrowing is compound of by endplate spurring. RIGHT L4 and
RIGHT L5 nerve root impingement are evident.

L5-S1: Moderate disc space narrowing. Previous LEFT hemilaminotomy.
Advanced endplate spurring is greater to the RIGHT along with
asymmetric RIGHT-sided facet arthropathy. No significant posterior
protrusion. RIGHT L5 nerve root, but not RIGHT S1 nerve root,
impingement is evident.
IMPRESSION: LUMBAR DISCOGRAM IMPRESSION:

Severe concordant reproduction of low back and RIGHT-sided
hip/radicular pain at both L4-5 and L5-S1.

CT POST-DISCOGRAM IMPRESSION:

Severe disc space narrowing at L4-5. Central and rightward extrusion
with RIGHT L4 and RIGHT L5 nerve root impingement.

Moderate disc space narrowing L5-S1. Foraminal narrowing due to
endplate spurring is the dominant finding at this level, affecting
the RIGHT L5 nerve root; subarticular zone narrowing is not
established.

Despite BILATERAL screw loosening at L2, L3, and L4, there appears
to be arthrodesis from L2 through L4 across the interspace.

CONTRAST:  Total of 4 mL intradiscal.

## 2015-11-15 ENCOUNTER — Other Ambulatory Visit: Payer: Self-pay | Admitting: Gastroenterology

## 2015-11-24 DIAGNOSIS — M545 Low back pain: Secondary | ICD-10-CM | POA: Diagnosis not present

## 2015-11-24 DIAGNOSIS — M4806 Spinal stenosis, lumbar region: Secondary | ICD-10-CM | POA: Diagnosis not present

## 2015-12-02 DIAGNOSIS — M25511 Pain in right shoulder: Secondary | ICD-10-CM | POA: Diagnosis not present

## 2015-12-05 ENCOUNTER — Encounter (HOSPITAL_COMMUNITY): Payer: Self-pay | Admitting: Psychiatry

## 2015-12-05 ENCOUNTER — Ambulatory Visit (INDEPENDENT_AMBULATORY_CARE_PROVIDER_SITE_OTHER): Payer: Medicare Other | Admitting: Psychiatry

## 2015-12-05 VITALS — BP 124/84 | HR 76 | Ht 65.5 in | Wt 190.2 lb

## 2015-12-05 DIAGNOSIS — F3130 Bipolar disorder, current episode depressed, mild or moderate severity, unspecified: Secondary | ICD-10-CM

## 2015-12-05 MED ORDER — ALPRAZOLAM 1 MG PO TABS: 1.0000 mg | ORAL_TABLET | Freq: Four times a day (QID) | ORAL | Status: DC

## 2015-12-05 MED ORDER — FLUOXETINE HCL 40 MG PO CAPS: 40.0000 mg | ORAL_CAPSULE | Freq: Two times a day (BID) | ORAL | Status: DC

## 2015-12-05 MED ORDER — OLANZAPINE 15 MG PO TABS: 15.0000 mg | ORAL_TABLET | Freq: Every day | ORAL | Status: DC

## 2015-12-05 NOTE — Progress Notes (Signed)
Patient ID: YERALDINE ALON, female   DOB: 09/20/1946, 70 y.o.   MRN: [id number] Patient ID: DOLLINDA DICELLO, female   DOB: 26-Aug-1946, 70 y.o.   MRN: [id number] Patient ID: GENETA MUDGE, female   DOB: 06-16-1946, 70 y.o.   MRN: [id number] Patient ID: KELISE KOPERA, female   DOB: 1946/06/01, 70 y.o.   MRN: [id number] Patient ID: ZALEYAH ROOKSTOOL, female   DOB: 09-28-1945, 70 y.o.   MRN: [id number] Patient ID: CHARSIE STEIMEL, female   DOB: Mar 05, 1946, 70 y.o.   MRN: [id number] Patient ID: EMMILEE ORSI, female   DOB: 1946-04-08, 70 y.o.   MRN: [id number]  Psychiatric Assessment Adult  Patient Identification:  Victoria Mccarthy Date of Evaluation:  12/05/2015 Chief Complaint: "I am doing much better" History of Chief Complaint:   Chief Complaint  Patient presents with  . Depression  . Manic Behavior  . Anxiety  . Follow-up    Depression        Associated symptoms include decreased concentration and appetite change.  Past medical history includes anxiety.   Anxiety Symptoms include decreased concentration and nervous/anxious behavior.     this patient is a 70 year old divorced white female who lives alone in [place]. She has one son, one daughter, 6 grandchildren 3 great-grandchildren. 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  The patient states that she's had problems with mental illness since her 58s. She was hospitalized at [facility] in [facility] twice in the 1980s. During these times she was depressed extremely anxious and having paranoid delusions that she had killed someone and that the police were after her. At one point she was hearing voices telling her to hurt her self. She finally started seeing Dr. Charmayne Sheer private practitioner in [place] who treated her for years. In the past she's been on Prozac, Symbyax, Xanax, Valium, BuSpar and lithium.  After her psychiatrist retired, she began going to the [facility] but got tired of waiting for  hours for recheck visit. She most recently she's been going to [place] [facility]. The psychiatrist there has cut down her medication. She used to be on Symbiax 6/50-2 a day and she was cut down to 4-25, once a day. Since this happened she become extremely anxious and depressed. At the beginning of this year however her insurance would not pay for the medicine so she's been off it entirely and she is getting even worse. The physician also cut her Xanax down from 1 mg 4 times a day to 0.5 mg twice a day and she is exceedingly anxious.  Currently the patient states that she is depressed all the time, very anxious and tearful, irritable and angry. She can't stand to be around people and stays by herself with her dogs. She's trying to spend some time sewing but it's very hard for her to concentrate. Her sleep is poor and her mind races. She is again having the delusion that she may have killed someone at the police may be coming to get her. She has nightmares about this. It's hard for her to sit still. She does not have any direct thoughts of hurting self or others. She constantly picks at her fingernails. She denies any current auditory or visual hallucinations  The patient returns after 2 months. She is recovering very well from her back surgery and is much less pain. She is no longer taking Robaxin or Percocet. She states that she felt very cloudy  and fuzzy headed on these medicines and is glad she can stop them. Her mood is good and her energy is returning and she is sleeping well at night. She denies either symptoms of mania or depression. She still staying with her mother who is ill Review of Systems  Constitutional: Positive for activity change, appetite change and unexpected weight change.  HENT: Negative.   Eyes: Negative.   Respiratory: Negative.   Cardiovascular: Negative.   Gastrointestinal: Positive for abdominal pain.  Endocrine: Negative.   Genitourinary: Negative.   Musculoskeletal:  Positive for back pain, arthralgias and neck pain.  Skin: Negative.   Allergic/Immunologic: Negative.   Hematological: Negative.   Psychiatric/Behavioral: Positive for depression, hallucinations, sleep disturbance, dysphoric mood, decreased concentration and agitation. The patient is nervous/anxious.    Physical Exam not done  Depressive Symptoms: depressed mood, anhedonia, insomnia, psychomotor retardation, fatigue, feelings of worthlessness/guilt, difficulty concentrating, hopelessness, anxiety, panic attacks, weight loss, decreased appetite,  (Hypo) Manic Symptoms:   Elevated Mood:  no Irritable Mood:  Yes Grandiosity:  No Distractibility:  Yes Labiality of Mood:  Yes Delusions:  Yes Hallucinations:  No Impulsivity:  No Sexually Inappropriate Behavior:  No Financial Extravagance:  No Flight of Ideas:  No  Anxiety Symptoms: Excessive Worry:  Yes Panic Symptoms:  Yes Agoraphobia:  Yes Obsessive Compulsive: Yes  Symptoms: Picks at nails Specific Phobias:  No Social Anxiety:  Yes  Psychotic Symptoms:  Hallucinations: No None Delusions:  Yes Paranoia:  Yes   Ideas of Reference:  No  PTSD Symptoms: Ever had a traumatic exposure:  No Had a traumatic exposure in the last month:  No Re-experiencing: No None Hypervigilance:  No Hyperarousal: No None Avoidance: No None  Traumatic Brain Injury: Yes Sports Related  Past Psychiatric History: Diagnosis: Bipolar disorder   Hospitalizations: Twice in the 1980s   Outpatient Care: At [facility], mental [place], with private practitioner   Substance Abuse Care: none  Self-Mutilation:none  Suicidal Attempts: none  Violent Behaviors: none   Past Medical History:   Past Medical History  Diagnosis Date  . PONV (postoperative nausea and vomiting)   . Arthritis     chronic back pain  . Diarrhea     incontinent stools  . Nocturia   . Urinary frequency   . Anemia   . Depression   . Psychotic  affective disorder ([place])     "psychotic delusions" "I cut myself"   . Restless leg syndrome   . Rheumatic fever   . MVP (mitral valve prolapse)   . Bipolar disorder ([place])   . Osteoporosis   . Osteoarthritis   . Diverticulitis     treated at least 5 times in past, hospitalized twice  . Cancer [facility]) Jan 2013    kidney cancer, left, s/p partial nephrectomy  . Heart murmur   . GERD (gastroesophageal reflux disease)     esophageal spasms - takes nitroglycerin for this  . Anxiety   . Neuropathy ([facility])    History of Loss of Consciousness:  No Seizure History:  No Cardiac History:  No Allergies:   Allergies  Allergen Reactions  . Shellfish Allergy Anaphylaxis  . Neurontin [Gabapentin]     Unable to talk when takes medication  . Vicodin [Hydrocodone-Acetaminophen] Itching  . Codeine Itching     itching  . Haldol [Haloperidol Lactate] Other (See Comments)    Muscle spasms  . Hydromorphone Hcl Nausea And Vomiting  . Latex Rash  . Propoxyphene N-Acetaminophen Nausea And Vomiting  Current Medications:  Current Outpatient Prescriptions  Medication Sig Dispense Refill  . ALPRAZolam (XANAX) 1 MG tablet Take 1 tablet (1 mg total) by mouth 4 (four) times daily. 120 tablet 2  . atorvastatin (LIPITOR) 10 MG tablet Take 10 mg by mouth at bedtime.  1  . cholestyramine (QUESTRAN) 4 GM/DOSE powder TAKE 1 SCOOPFUL (4 G TOTAL) BY MOUTH DAILY. DO NOT TAKE WITHIN 2 HOURS OF OTHER MEDICATIONS 378 g 5  . FLUoxetine (PROZAC) 40 MG capsule Take 1 capsule (40 mg total) by mouth 2 (two) times daily. 60 capsule 2  . mirabegron ER (MYRBETRIQ) 50 MG TB24 tablet Take 50 mg by mouth daily.    . Multiple Vitamin (MULTIVITAMIN WITH MINERALS) TABS Take 1 tablet by mouth every evening.    . nitroGLYCERIN (NITROSTAT) 0.4 MG SL tablet Place 0.4 mg under the tongue every 5 (five) minutes as needed for chest pain.    Marland Kitchen OLANZapine (ZYPREXA) 15 MG tablet Take 1 tablet (15 mg total) by mouth at bedtime. 90 tablet 3  .  rOPINIRole (REQUIP) 3 MG tablet TAKE 1 TABLET BY MOUTH 3 TIMES DAILY.  11   No current facility-administered medications for this visit.    Previous Psychotropic Medications:  Medication Dose   Prozac, Symbyax, Xanax, Valium, BuSpar, lithium                        Substance Abuse History in the last 12 months: Substance Age of 1st Use Last Use Amount Specific Type  Nicotine      Alcohol      Cannabis      Opiates      Cocaine      Methamphetamines      LSD      Ecstasy      Benzodiazepines      Caffeine      Inhalants      Others:                          Medical Consequences of Substance Abuse: none  Legal Consequences of Substance Abuse: none  Family Consequences of Substance Abuse: none  Blackouts:  No DT's:  No Withdrawal Symptoms:  No None  Social History: Current Place of Residence: [place] of Birth: [place] Family Members: 2 children, several grandchildren Marital Status:  Divorced Children:   Sons: 1  Daughters: 1 Relationships: No friends Education:  Dentist Problems/Performance:  Religious Beliefs/Practices: none History of Abuse: none Pensions consultant; Artist, Catering manager History:  None. Legal History: none Hobbies/Interests: TV, sewing  Family History:   Family History  Problem Relation Age of Onset  . Colon cancer Neg Hx   . Bipolar disorder Daughter     Mental Status Examination/Evaluation: Objective:  Appearance: Neat and Well Groomed  Engineer, water::  Fair  Speech:  Clear and coherent   Volume:  Normal  Mood: Good   Affect Bright   Thought Process:  Goal Directed  Orientation:  Full (Time, Place, and Person)  Thought Content:  normal  Suicidal Thoughts:  No  Homicidal Thoughts:  No  Judgement:  Fair  Insight:  Fair  Psychomotor Activity:  Normal   Akathisia:  No  Handed:  Right  AIMS (if indicated):    Assets:  Communication Skills Desire for  Improvement Resilience Social Support Talents/Skills    Laboratory/X-Ray Psychological Evaluation(s)   Labs reviewed in the chart are  unremarkable      Assessment:  Axis I: Bipolar, mixed  AXIS I Bipolar, mixed  AXIS II Deferred  AXIS III Past Medical History  Diagnosis Date  . PONV (postoperative nausea and vomiting)   . Arthritis     chronic back pain  . Diarrhea     incontinent stools  . Nocturia   . Urinary frequency   . Anemia   . Depression   . Psychotic affective disorder ([place])     "psychotic delusions" "I cut myself"   . Restless leg syndrome   . Rheumatic fever   . MVP (mitral valve prolapse)   . Bipolar disorder ([place])   . Osteoporosis   . Osteoarthritis   . Diverticulitis     treated at least 5 times in past, hospitalized twice  . Cancer [facility]) Jan 2013    kidney cancer, left, s/p partial nephrectomy  . Heart murmur   . GERD (gastroesophageal reflux disease)     esophageal spasms - takes nitroglycerin for this  . Anxiety   . Neuropathy ([place])      AXIS IV other psychosocial or environmental problems  AXIS V 41-50 serious symptoms   Treatment Plan/Recommendations:  Plan of Care: Medication management   Laboratory:  As noted in history of present illness   Psychotherapy: She is seeing Tera Mater here     She will will continue Prozac 80 mg every morning for depression and continue Zyprexa  15 mg daily at bedtime for her bipolar disorder and delusions. She will continue Xanax 1 mg 4 times a day for anxiety   Routine PRN Medications:  No  Consultations:   Safety Concerns:  She denies thoughts of hurting self or others   Other:  She'll return in 3 months     Levonne Spiller, MD 3/17/20171:45 PM

## 2015-12-11 DIAGNOSIS — M25511 Pain in right shoulder: Secondary | ICD-10-CM | POA: Diagnosis not present

## 2015-12-11 DIAGNOSIS — G8929 Other chronic pain: Secondary | ICD-10-CM | POA: Diagnosis not present

## 2015-12-16 ENCOUNTER — Encounter: Payer: Self-pay | Admitting: Internal Medicine

## 2015-12-24 DIAGNOSIS — M25511 Pain in right shoulder: Secondary | ICD-10-CM | POA: Diagnosis not present

## 2016-01-28 ENCOUNTER — Encounter: Payer: Self-pay | Admitting: Gastroenterology

## 2016-01-28 ENCOUNTER — Ambulatory Visit (INDEPENDENT_AMBULATORY_CARE_PROVIDER_SITE_OTHER): Payer: Medicare Other | Admitting: Gastroenterology

## 2016-01-28 VITALS — BP 144/84 | HR 88 | Temp 98.8°F | Ht 65.0 in | Wt 193.2 lb

## 2016-01-28 DIAGNOSIS — R143 Flatulence: Secondary | ICD-10-CM | POA: Diagnosis not present

## 2016-01-28 DIAGNOSIS — R635 Abnormal weight gain: Secondary | ICD-10-CM | POA: Diagnosis not present

## 2016-01-28 DIAGNOSIS — R197 Diarrhea, unspecified: Secondary | ICD-10-CM

## 2016-01-28 MED ORDER — CHOLESTYRAMINE 4 GM/DOSE PO POWD: ORAL | Status: DC

## 2016-01-28 NOTE — Progress Notes (Signed)
Primary Care Physician: Collene Mares, PA-C  Primary Gastroenterologist:  Garfield Cornea, MD   Chief Complaint  Patient presents with  . Follow-up    HPI: Victoria Mccarthy is a 70 y.o. female here For two-year follow-up of chronic GERD and diarrhea. History of chronic diarrhea historically well controlled with low-dose Questran. Takes one scoopful at bedtime. Has 1-2 bowel movements daily of formed stool. Denies blood in the stool or melena. Denies abdominal pain. Heartburn well-controlled. Complains of a lot of flatulence. This is been going on for a few months. She reports a 20 pound weight gain in the past 6 months although weights are fairly consistent over the past year or 2 per Epic. No longer on PPI therapy.  Nothing new as far as food intake or medications except added vitamin for hair, skin, and nails.    Current Outpatient Prescriptions  Medication Sig Dispense Refill  . ALPRAZolam (XANAX) 1 MG tablet Take 1 tablet (1 mg total) by mouth 4 (four) times daily. 120 tablet 2  . atorvastatin (LIPITOR) 10 MG tablet Take 10 mg by mouth at bedtime.  1  . cholestyramine (QUESTRAN) 4 GM/DOSE powder TAKE 1 SCOOPFUL (4 G TOTAL) BY MOUTH DAILY. DO NOT TAKE WITHIN 2 HOURS OF OTHER MEDICATIONS 378 g 5  . FLUoxetine (PROZAC) 40 MG capsule Take 1 capsule (40 mg total) by mouth 2 (two) times daily. 60 capsule 2  . mirabegron ER (MYRBETRIQ) 50 MG TB24 tablet Take 50 mg by mouth daily.    . Multiple Vitamin (MULTIVITAMIN WITH MINERALS) TABS Take 1 tablet by mouth every evening.    . nitroGLYCERIN (NITROSTAT) 0.4 MG SL tablet Place 0.4 mg under the tongue every 5 (five) minutes as needed for chest pain.    Marland Kitchen OLANZapine (ZYPREXA) 15 MG tablet Take 1 tablet (15 mg total) by mouth at bedtime. 90 tablet 3  . rOPINIRole (REQUIP) 3 MG tablet TAKE 1 TABLET BY MOUTH 3 TIMES DAILY.  11   No current facility-administered medications for this visit.    Allergies as of 01/28/2016 - Review Complete  01/28/2016  Allergen Reaction Noted  . Shellfish allergy Anaphylaxis 08/11/2011  . Neurontin [gabapentin]  02/28/2013  . Vicodin [hydrocodone-acetaminophen] Itching 07/11/2014  . Codeine Itching 09/26/2009  . Haldol [haloperidol lactate] Other (See Comments) 07/16/2015  . Hydromorphone hcl Nausea And Vomiting   . Latex Rash 08/11/2011  . Propoxyphene n-acetaminophen Nausea And Vomiting 09/26/2009    ROS:  General: Negative for anorexia, weight loss, fever, chills, fatigue, weakness. ENT: Negative for hoarseness, difficulty swallowing , nasal congestion. CV: Negative for chest pain, angina, palpitations, dyspnea on exertion, peripheral edema.  Respiratory: Negative for dyspnea at rest, dyspnea on exertion, cough, sputum, wheezing.  GI: See history of present illness. GU:  Negative for dysuria, hematuria, urinary incontinence, urinary frequency, nocturnal urination.  Endo: Negative for unusual weight change.    Physical Examination:   BP 144/84 mmHg  Pulse 88  Temp(Src) 98.8 F (37.1 C)  Ht 5\' 5"  (1.651 m)  Wt 193 lb 3.2 oz (87.635 kg)  BMI 32.15 kg/m2  General: Well-nourished, well-developed in no acute distress.  Eyes: No icterus. Mouth: Oropharyngeal mucosa moist and pink , no lesions erythema or exudate. Lungs: Clear to auscultation bilaterally.  Heart: Regular rate and rhythm, no murmurs rubs or gallops.  Abdomen: Bowel sounds are normal, nontender, nondistended, no hepatosplenomegaly or masses, no abdominal bruits or hernia , no rebound or guarding.   Extremities: No lower extremity  edema. No clubbing or deformities. Neuro: Alert and oriented x 4   Skin: Warm and dry, no jaundice.   Psych: Alert and cooperative, normal mood and affect.  Labs:  Lab Results  Component Value Date   CREATININE 0.79 09/03/2015   BUN 12 09/03/2015   NA 138 09/03/2015   K 3.9 09/03/2015   CL 104 09/03/2015   CO2 26 09/03/2015   Lab Results  Component Value Date   WBC 4.9 09/03/2015    HGB 13.0 09/03/2015   HCT 39.5 09/03/2015   MCV 94.5 09/03/2015   PLT 184 09/03/2015    Imaging Studies: No results found.

## 2016-01-28 NOTE — Patient Instructions (Signed)
1. Continue questran as before.  2. Please have your labs done. We will contact you with results within 5-7 business days. 3. Begin a probiotic daily for four weeks. Samples of Align provided. You can buy over the counter. Other good options are [employer]. 4. Read over gas bloat information and reduce any foods or activities that may be contributing to your flatulence. Drinking from a straw, sucking on hard candy/chewing gum all add more air into your digestive track.  5. Return to the office in 2 years or sooner if needed.

## 2016-01-28 NOTE — Progress Notes (Signed)
cc'ed to pcp °

## 2016-01-28 NOTE — Assessment & Plan Note (Signed)
Chronic diarrhea for years, well managed on low-dose Questran. Complains of several month history of excessive flatulence. Reports only new medication is vitamin. No change in the consumption. Trial of probiotics. Samples of align provided today. Gas bloat information provided, she should cut out any items that she notes that she eats excessively or activities that she does excessively. She will call in a couple weeks with a progress report as needed. Otherwise we will see her back in 2 years. Ex-  Of note, we will check celiac disease screening and thyroid function.

## 2016-01-29 LAB — T4, FREE: Free T4: 1 ng/dL (ref 0.8–1.8)

## 2016-01-29 LAB — IGA: IGA: 134 mg/dL (ref 69–380)

## 2016-01-29 LAB — TSH: TSH: 2.25 mIU/L

## 2016-01-30 LAB — TISSUE TRANSGLUTAMINASE, IGA: Tissue Transglutaminase Ab, IgA: 1 U/mL (ref <4)

## 2016-02-02 ENCOUNTER — Other Ambulatory Visit (HOSPITAL_COMMUNITY): Payer: Self-pay | Admitting: Psychiatry

## 2016-02-02 NOTE — Progress Notes (Signed)
Quick Note:  Celiac screen negative. Thyroid function normal. Plans as per ov note. ______

## 2016-02-23 DIAGNOSIS — Z6832 Body mass index (BMI) 32.0-32.9, adult: Secondary | ICD-10-CM | POA: Diagnosis not present

## 2016-02-23 DIAGNOSIS — R030 Elevated blood-pressure reading, without diagnosis of hypertension: Secondary | ICD-10-CM | POA: Diagnosis not present

## 2016-02-23 DIAGNOSIS — M545 Low back pain: Secondary | ICD-10-CM | POA: Diagnosis not present

## 2016-02-27 DIAGNOSIS — Z0000 Encounter for general adult medical examination without abnormal findings: Secondary | ICD-10-CM | POA: Diagnosis not present

## 2016-02-27 DIAGNOSIS — Z1389 Encounter for screening for other disorder: Secondary | ICD-10-CM | POA: Diagnosis not present

## 2016-02-27 DIAGNOSIS — Z6841 Body Mass Index (BMI) 40.0 and over, adult: Secondary | ICD-10-CM | POA: Diagnosis not present

## 2016-02-27 DIAGNOSIS — E785 Hyperlipidemia, unspecified: Secondary | ICD-10-CM | POA: Diagnosis not present

## 2016-03-04 ENCOUNTER — Ambulatory Visit (INDEPENDENT_AMBULATORY_CARE_PROVIDER_SITE_OTHER): Payer: Medicare Other | Admitting: Psychiatry

## 2016-03-04 ENCOUNTER — Encounter (HOSPITAL_COMMUNITY): Payer: Self-pay | Admitting: Psychiatry

## 2016-03-04 VITALS — BP 117/79 | HR 90 | Ht 65.0 in | Wt 198.2 lb

## 2016-03-04 DIAGNOSIS — F3130 Bipolar disorder, current episode depressed, mild or moderate severity, unspecified: Secondary | ICD-10-CM | POA: Diagnosis not present

## 2016-03-04 MED ORDER — OLANZAPINE 15 MG PO TABS: 15.0000 mg | ORAL_TABLET | Freq: Every day | ORAL | Status: DC

## 2016-03-04 MED ORDER — ALPRAZOLAM 1 MG PO TABS: 1.0000 mg | ORAL_TABLET | Freq: Four times a day (QID) | ORAL | Status: DC

## 2016-03-04 MED ORDER — FLUOXETINE HCL 40 MG PO CAPS: 40.0000 mg | ORAL_CAPSULE | Freq: Two times a day (BID) | ORAL | Status: DC

## 2016-03-04 NOTE — Progress Notes (Signed)
Patient ID: Victoria Mccarthy, female   DOB: 05/22/1946, 70 y.o.   MRN: [id number] Patient ID: Victoria Mccarthy, female   DOB: 1945/12/09, 70 y.o.   MRN: [id number] Patient ID: Victoria Mccarthy, female   DOB: [birth year], 70 y.o.   MRN: [id number] Patient ID: Victoria Mccarthy, female   DOB: 10-20-1945, 70 y.o.   MRN: [id number] Patient ID: Victoria Mccarthy, female   DOB: 11-15-1945, 69 y.o.   MRN: [id number] Patient ID: Victoria Mccarthy, female   DOB: [birth year], 71 y.o.   MRN: [id number] Patient ID: Victoria Mccarthy, female   DOB: 12/12/1945, 70 y.o.   MRN: [id number] Patient ID: Victoria Mccarthy, female   DOB: 06-Apr-1946, 70 y.o.   MRN: [id number]  Psychiatric Assessment Adult  Patient Identification:  Victoria Mccarthy Date of Evaluation:  03/04/2016 Chief Complaint: "I am doing much better" History of Chief Complaint:   Chief Complaint  Patient presents with  . Depression  . Anxiety  . Manic Behavior  . Follow-up    Depression        Associated symptoms include decreased concentration and appetite change.  Past medical history includes anxiety.   Anxiety Symptoms include decreased concentration and nervous/anxious behavior.     this patient is a 70 year old divorced white female who lives alone in [place]. She has one son, one daughter, 6 grandchildren 3 great-grandchildren. 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  The patient states that she's had problems with mental illness since her 54s. She was hospitalized at [facility] in [facility] twice in the 1980s. During these times she was depressed extremely anxious and having paranoid delusions that she had killed someone and that the police were after her. At one point she was hearing voices telling her to hurt her self. She finally started seeing Dr. Charmayne Sheer private practitioner in [place] who treated her for years. In the past she's been on Prozac, Symbyax, Xanax, Valium, BuSpar and lithium.  After her psychiatrist  retired, she began going to the [facility] but got tired of waiting for hours for recheck visit. She most recently she's been going to [facility]. The psychiatrist there has cut down her medication. She used to be on Symbiax 6/50-2 a day and she was cut down to 4-25, once a day. Since this happened she become extremely anxious and depressed. At the beginning of this year however her insurance would not pay for the medicine so she's been off it entirely and she is getting even worse. The physician also cut her Xanax down from 1 mg 4 times a day to 0.5 mg twice a day and she is exceedingly anxious.  Currently the patient states that she is depressed all the time, very anxious and tearful, irritable and angry. She can't stand to be around people and stays by herself with her dogs. She's trying to spend some time sewing but it's very hard for her to concentrate. Her sleep is poor and her mind races. She is again having the delusion that she may have killed someone at the police may be coming to get her. She has nightmares about this. It's hard for her to sit still. She does not have any direct thoughts of hurting self or others. She constantly picks at her fingernails. She denies any current auditory or visual hallucinations  The patient returns after 2 months. She is doing fairly well. She is recovering from back surgery but is  found out she has to have right shoulder surgery. She is not spending as much time with her mom and forcing her mom to do more for herself. Her mood is good and she is sleeping well. She's no longer depressed and no longer has any delusions or paranoia. She thinks that her medications are working well for her Review of Systems  Constitutional: Positive for activity change, appetite change and unexpected weight change.  HENT: Negative.   Eyes: Negative.   Respiratory: Negative.   Cardiovascular: Negative.   Gastrointestinal: Positive for abdominal pain.  Endocrine:  Negative.   Genitourinary: Negative.   Musculoskeletal: Positive for back pain, arthralgias and neck pain.  Skin: Negative.   Allergic/Immunologic: Negative.   Hematological: Negative.   Psychiatric/Behavioral: Positive for depression, hallucinations, sleep disturbance, dysphoric mood, decreased concentration and agitation. The patient is nervous/anxious.    Physical Exam not done  Depressive Symptoms: depressed mood, anhedonia, insomnia, psychomotor retardation, fatigue, feelings of worthlessness/guilt, difficulty concentrating, hopelessness, anxiety, panic attacks, weight loss, decreased appetite,  (Hypo) Manic Symptoms:   Elevated Mood:  no Irritable Mood:  Yes Grandiosity:  No Distractibility:  Yes Labiality of Mood:  Yes Delusions:  Yes Hallucinations:  No Impulsivity:  No Sexually Inappropriate Behavior:  No Financial Extravagance:  No Flight of Ideas:  No  Anxiety Symptoms: Excessive Worry:  Yes Panic Symptoms:  Yes Agoraphobia:  Yes Obsessive Compulsive: Yes  Symptoms: Picks at nails Specific Phobias:  No Social Anxiety:  Yes  Psychotic Symptoms:  Hallucinations: No None Delusions:  Yes Paranoia:  Yes   Ideas of Reference:  No  PTSD Symptoms: Ever had a traumatic exposure:  No Had a traumatic exposure in the last month:  No Re-experiencing: No None Hypervigilance:  No Hyperarousal: No None Avoidance: No None  Traumatic Brain Injury: Yes Sports Related  Past Psychiatric History: Diagnosis: Bipolar disorder   Hospitalizations: Twice in the 1980s   Outpatient Care: At [facility], [facility] [place], with private practitioner   Substance Abuse Care: none  Self-Mutilation:none  Suicidal Attempts: none  Violent Behaviors: none   Past Medical History:   Past Medical History  Diagnosis Date  . PONV (postoperative nausea and vomiting)   . Arthritis     chronic back pain  . Diarrhea     incontinent stools  . Nocturia   . Urinary  frequency   . Anemia   . Depression   . Psychotic affective disorder ([place])     "psychotic delusions" "I cut myself"   . Restless leg syndrome   . Rheumatic fever   . MVP (mitral valve prolapse)   . Bipolar disorder ([place])   . Osteoporosis   . Osteoarthritis   . Diverticulitis     treated at least 5 times in past, hospitalized twice  . Cancer [facility]) Jan 2013    kidney cancer, left, s/p partial nephrectomy  . Heart murmur   . GERD (gastroesophageal reflux disease)     esophageal spasms - takes nitroglycerin for this  . Anxiety   . Neuropathy (H[facility])    History of Loss of Consciousness:  No Seizure History:  No Cardiac History:  No Allergies:   Allergies  Allergen Reactions  . Shellfish Allergy Anaphylaxis  . Neurontin [Gabapentin]     Unable to talk when takes medication  . Vicodin [Hydrocodone-Acetaminophen] Itching  . Codeine Itching     itching  . Haldol [Haloperidol Lactate] Other (See Comments)    Muscle spasms  . Hydromorphone Hcl Nausea And  Vomiting  . Latex Rash  . Propoxyphene N-Acetaminophen Nausea And Vomiting   Current Medications:  Current Outpatient Prescriptions  Medication Sig Dispense Refill  . ALPRAZolam (XANAX) 1 MG tablet Take 1 tablet (1 mg total) by mouth 4 (four) times daily. 120 tablet 2  . atorvastatin (LIPITOR) 10 MG tablet Take 10 mg by mouth at bedtime.  1  . cholestyramine (QUESTRAN) 4 GM/DOSE powder TAKE 1 SCOOPFUL (4 G TOTAL) BY MOUTH DAILY. DO NOT TAKE WITHIN 2 HOURS OF OTHER MEDICATIONS 378 g 5  . FLUoxetine (PROZAC) 40 MG capsule Take 1 capsule (40 mg total) by mouth 2 (two) times daily. 60 capsule 2  . mirabegron ER (MYRBETRIQ) 50 MG TB24 tablet Take 50 mg by mouth daily.    . Multiple Vitamin (MULTIVITAMIN WITH MINERALS) TABS Take 1 tablet by mouth every evening.    . nitroGLYCERIN (NITROSTAT) 0.4 MG SL tablet Place 0.4 mg under the tongue every 5 (five) minutes as needed for chest pain.    Marland Kitchen OLANZapine (ZYPREXA) 15 MG tablet Take 1  tablet (15 mg total) by mouth at bedtime. 90 tablet 3  . rOPINIRole (REQUIP) 3 MG tablet TAKE 1 TABLET BY MOUTH 3 TIMES DAILY.  11   No current facility-administered medications for this visit.    Previous Psychotropic Medications:  Medication Dose   Prozac, Symbyax, Xanax, Valium, BuSpar, lithium                        Substance Abuse History in the last 12 months: Substance Age of 1st Use Last Use Amount Specific Type  Nicotine      Alcohol      Cannabis      Opiates      Cocaine      Methamphetamines      LSD      Ecstasy      Benzodiazepines      Caffeine      Inhalants      Others:                          Medical Consequences of Substance Abuse: none  Legal Consequences of Substance Abuse: none  Family Consequences of Substance Abuse: none  Blackouts:  No DT's:  No Withdrawal Symptoms:  No None  Social History: Current Place of Residence: [place] of Birth: [place] Family Members: 2 children, several grandchildren Marital Status:  Divorced Children:   Sons: 1  Daughters: 1 Relationships: No friends Education:  Dentist Problems/Performance:  Religious Beliefs/Practices: none History of Abuse: none Pensions consultant; Artist, Catering manager History:  None. Legal History: none Hobbies/Interests: TV, sewing  Family History:   Family History  Problem Relation Age of Onset  . Colon cancer Neg Hx   . Bipolar disorder Daughter     Mental Status Examination/Evaluation: Objective:  Appearance: Neat and Well Groomed  Engineer, water::  Fair  Speech:  Clear and coherent   Volume:  Normal  Mood: Good   Affect Bright   Thought Process:  Goal Directed  Orientation:  Full (Time, Place, and Person)  Thought Content:  normal  Suicidal Thoughts:  No  Homicidal Thoughts:  No  Judgement:  Fair  Insight:  Fair  Psychomotor Activity:  Normal   Akathisia:  No  Handed:  Right  AIMS (if  indicated):    Assets:  Communication Skills Desire for Improvement Resilience Social Support Talents/Skills  Laboratory/X-Ray Psychological Evaluation(s)   Labs reviewed in the chart are unremarkable      Assessment:  Axis I: Bipolar, mixed  AXIS I Bipolar, mixed  AXIS II Deferred  AXIS III Past Medical History  Diagnosis Date  . PONV (postoperative nausea and vomiting)   . Arthritis     chronic back pain  . Diarrhea     incontinent stools  . Nocturia   . Urinary frequency   . Anemia   . Depression   . Psychotic affective disorder ([place])     "psychotic delusions" "I cut myself"   . Restless leg syndrome   . Rheumatic fever   . MVP (mitral valve prolapse)   . Bipolar disorder ([place])   . Osteoporosis   . Osteoarthritis   . Diverticulitis     treated at least 5 times in past, hospitalized twice  . Cancer [facility]) Jan 2013    kidney cancer, left, s/p partial nephrectomy  . Heart murmur   . GERD (gastroesophageal reflux disease)     esophageal spasms - takes nitroglycerin for this  . Anxiety   . Neuropathy ([place])      AXIS IV other psychosocial or environmental problems  AXIS V 41-50 serious symptoms   Treatment Plan/Recommendations:  Plan of Care: Medication management   Laboratory:  As noted in history of present illness   Psychotherapy: She is seeing Tera Mater here     She will will continue Prozac 80 mg every morning for depression and continue Zyprexa  15 mg daily at bedtime for her bipolar disorder and delusions. She will continue Xanax 1 mg 4 times a day for anxiety   Routine PRN Medications:  No  Consultations:   Safety Concerns:  She denies thoughts of hurting self or others   Other:  She'll return in 3 months     Levonne Spiller, MD 6/15/20173:04 PM

## 2016-03-16 ENCOUNTER — Other Ambulatory Visit (HOSPITAL_COMMUNITY): Payer: Self-pay | Admitting: Internal Medicine

## 2016-03-16 DIAGNOSIS — Z1231 Encounter for screening mammogram for malignant neoplasm of breast: Secondary | ICD-10-CM

## 2016-03-25 DIAGNOSIS — M12811 Other specific arthropathies, not elsewhere classified, right shoulder: Secondary | ICD-10-CM | POA: Diagnosis not present

## 2016-03-25 DIAGNOSIS — M25512 Pain in left shoulder: Secondary | ICD-10-CM | POA: Diagnosis not present

## 2016-03-26 ENCOUNTER — Ambulatory Visit (HOSPITAL_COMMUNITY): Payer: Self-pay

## 2016-03-26 ENCOUNTER — Ambulatory Visit (HOSPITAL_COMMUNITY)
Admission: RE | Admit: 2016-03-26 | Discharge: 2016-03-26 | Disposition: A | Discharge status: Home / Self Care | Payer: Medicare Other | Source: Ambulatory Visit | Attending: Internal Medicine | Admitting: Internal Medicine

## 2016-03-26 DIAGNOSIS — Z1231 Encounter for screening mammogram for malignant neoplasm of breast: Secondary | ICD-10-CM | POA: Diagnosis not present

## 2016-03-29 ENCOUNTER — Ambulatory Visit (HOSPITAL_COMMUNITY): Payer: Medicare Other

## 2016-04-22 DIAGNOSIS — M545 Low back pain: Secondary | ICD-10-CM | POA: Diagnosis not present

## 2016-05-11 DIAGNOSIS — K1370 Unspecified lesions of oral mucosa: Secondary | ICD-10-CM | POA: Diagnosis not present

## 2016-05-14 DIAGNOSIS — M7062 Trochanteric bursitis, left hip: Secondary | ICD-10-CM | POA: Diagnosis not present

## 2016-05-14 DIAGNOSIS — Z6832 Body mass index (BMI) 32.0-32.9, adult: Secondary | ICD-10-CM | POA: Diagnosis not present

## 2016-05-14 DIAGNOSIS — R030 Elevated blood-pressure reading, without diagnosis of hypertension: Secondary | ICD-10-CM | POA: Diagnosis not present

## 2016-05-14 NOTE — H&P (Signed)
Victoria Mccarthy is an 70 y.o. female.    Chief Complaint: right shoulder pain  HPI: Pt is a 70 y.o. female complaining of right shoulder pain for multiple years. Pain had continually increased since the beginning. X-rays in the clinic show end-stage arthritic changes of the right shoulder. Pt has tried various conservative treatments which have failed to alleviate their symptoms, including injections and therapy. Various options are discussed with the patient. Risks, benefits and expectations were discussed with the patient. Patient understand the risks, benefits and expectations and wishes to proceed with surgery.   PCP:  Collene Mares, PA-C  D/C Plans: Home  PMH: Past Medical History:  Diagnosis Date  . Anemia   . Anxiety   . Arthritis    chronic back pain  . Bipolar disorder ([place])   . Cancer [facility]) Jan 2013   kidney cancer, left, s/p partial nephrectomy  . Depression   . Diarrhea    incontinent stools  . Diverticulitis    treated at least 5 times in past, hospitalized twice  . GERD (gastroesophageal reflux disease)    esophageal spasms - takes nitroglycerin for this  . Heart murmur   . MVP (mitral valve prolapse)   . Neuropathy ([place])   . Nocturia   . Osteoarthritis   . Osteoporosis   . PONV (postoperative nausea and vomiting)   . Psychotic affective disorder ([place])    "psychotic delusions" "I cut myself"   . Restless leg syndrome   . Rheumatic fever   . Urinary frequency     PSH: Past Surgical History:  Procedure Laterality Date  . ABDOMINAL HYSTERECTOMY    . BACK SURGERY    . BACK SURGERY     2 plates, 8 screws in back  . carpell tunnell     rt wrist  x2  . CHOLECYSTECTOMY    . COLONOSCOPY  5/02   pancolonic deverticula, internal hemorrhoids  . COLONOSCOPY  1/11   single external hemorrhoidal tag and anal papilla otherwise normal rectum, pancolonic diverticula, s/p sigmoid biopsy and stool sampling all unremarkable  . ESOPHAGOGASTRODUODENOSCOPY  04/06/2010     intact Nissen fundoplication S/P dilation, somewhat baggy atonic appearing esophagus, 58-F dilation  . ESOPHAGOGASTRODUODENOSCOPY  1/11   nomral esophagus s/p 54-F Maloney dilation, normal/intact Nissen fundoplication, diffuse patchy erythema and erosions likely NSAID/ASA effect with benign biopsy  . EYE SURGERY     right eye cataract  . kidney surgery for cancer    . MAXIMUM ACCESS (MAS)POSTERIOR LUMBAR INTERBODY FUSION (PLIF) 1 LEVEL N/A 07/25/2014   Procedure: FOR MAXIMUM ACCESS (MAS) POSTERIOR LUMBAR INTERBODY FUSION (PLIF) Lumbar two/three, Removal of hardware Lumbar three/four;  Surgeon: Eustace Moore, MD;  Location: [place] [facility];  Service: Neurosurgery;  Laterality: N/A;  . MAXIMUM ACCESS (MAS)POSTERIOR LUMBAR INTERBODY FUSION (PLIF) 2 LEVEL N/A 09/11/2015   Procedure: LUMBAR FOUR-FIVE LUMBAR FIVE SACRAL ONE  MAXIMUM ACCESS SURGERY, POSTERIOR LUMBAR INTERBODY FUSION , Removal of LUMBAR TWO TO LUMBAR FOUR  HARDWARE;  Surgeon: Eustace Moore, MD;  Location: [place] [facility];  Service: Neurosurgery;  Laterality: N/A;  . NISSEN FUNDOPLICATION    . ROBOT ASSISTED LAPAROSCOPIC NEPHRECTOMY  08/23/2011   Procedure: ROBOTIC ASSISTED LAPAROSCOPIC NEPHRECTOMY;  Surgeon: Dutch Gray, MD;  Location: [facility];  Service: Urology;  Laterality: Left;  Left Robotic Assisted  Laparoscopic Partial Nephrectomy   . ROTATOR CUFF REPAIR     right side X 2  . TONSILLECTOMY      Social History:  reports that  she has never smoked. She does not have any smokeless tobacco history on file. She reports that she does not drink alcohol or use drugs.  Allergies:  Allergies  Allergen Reactions  . Shellfish Allergy Anaphylaxis  . Neurontin [Gabapentin]     Unable to talk when takes medication  . Vicodin [Hydrocodone-Acetaminophen] Itching  . Codeine Itching     itching  . Haldol [Haloperidol Lactate] Other (See Comments)    Muscle spasms  . Hydromorphone Hcl Nausea And Vomiting  . Latex Rash  . Propoxyphene  N-Acetaminophen Nausea And Vomiting    Medications: No current facility-administered medications for this encounter.    Current Outpatient Prescriptions  Medication Sig Dispense Refill  . ALPRAZolam (XANAX) 1 MG tablet Take 1 tablet (1 mg total) by mouth 4 (four) times daily. 120 tablet 2  . atorvastatin (LIPITOR) 10 MG tablet Take 10 mg by mouth at bedtime.  1  . cholestyramine (QUESTRAN) 4 GM/DOSE powder TAKE 1 SCOOPFUL (4 G TOTAL) BY MOUTH DAILY. DO NOT TAKE WITHIN 2 HOURS OF OTHER MEDICATIONS 378 g 5  . FLUoxetine (PROZAC) 40 MG capsule Take 1 capsule (40 mg total) by mouth 2 (two) times daily. 60 capsule 2  . mirabegron ER (MYRBETRIQ) 50 MG TB24 tablet Take 50 mg by mouth daily.    . Multiple Vitamin (MULTIVITAMIN WITH MINERALS) TABS Take 1 tablet by mouth every evening.    . nitroGLYCERIN (NITROSTAT) 0.4 MG SL tablet Place 0.4 mg under the tongue every 5 (five) minutes as needed for chest pain.    Marland Kitchen OLANZapine (ZYPREXA) 15 MG tablet Take 1 tablet (15 mg total) by mouth at bedtime. 90 tablet 3  . rOPINIRole (REQUIP) 3 MG tablet TAKE 1 TABLET BY MOUTH 3 TIMES DAILY.  11    No results found for this or any previous visit (from the past 48 hour(s)). No results found.  ROS: Pain with rom of the right upper extremity  Physical Exam:  Alert and oriented 70 y.o. female in no acute distress Cranial nerves 2-12 intact Cervical spine: full rom with no tenderness, nv intact distally Chest: active breath sounds bilaterally, no wheeze rhonchi or rales Heart: regular rate and rhythm, no murmur Abd: non tender non distended with active bowel sounds Hip is stable with rom  Right shoulder with limited rom and strength nv intact distally No rashes   Assessment/Plan Assessment: right rotator cuff arthropathy  Plan: Patient will undergo a right reverse total shoulder by Dr. Veverly Fells at [facility]. Risks benefits and expectations were discussed with the patient. Patient understand risks,  benefits and expectations and wishes to proceed.

## 2016-05-20 ENCOUNTER — Encounter (HOSPITAL_COMMUNITY): Payer: Self-pay

## 2016-05-20 ENCOUNTER — Encounter (HOSPITAL_COMMUNITY)
Admission: RE | Admit: 2016-05-20 | Discharge: 2016-05-20 | Disposition: A | Discharge status: Home / Self Care | Payer: Medicare Other | Source: Ambulatory Visit | Attending: Orthopedic Surgery | Admitting: Orthopedic Surgery

## 2016-05-20 DIAGNOSIS — K219 Gastro-esophageal reflux disease without esophagitis: Secondary | ICD-10-CM | POA: Diagnosis not present

## 2016-05-20 DIAGNOSIS — Z01812 Encounter for preprocedural laboratory examination: Secondary | ICD-10-CM | POA: Diagnosis not present

## 2016-05-20 DIAGNOSIS — I341 Nonrheumatic mitral (valve) prolapse: Secondary | ICD-10-CM | POA: Insufficient documentation

## 2016-05-20 DIAGNOSIS — Z905 Acquired absence of kidney: Secondary | ICD-10-CM | POA: Diagnosis not present

## 2016-05-20 DIAGNOSIS — Z01818 Encounter for other preprocedural examination: Secondary | ICD-10-CM | POA: Diagnosis not present

## 2016-05-20 DIAGNOSIS — Z85528 Personal history of other malignant neoplasm of kidney: Secondary | ICD-10-CM | POA: Insufficient documentation

## 2016-05-20 DIAGNOSIS — Z79899 Other long term (current) drug therapy: Secondary | ICD-10-CM | POA: Insufficient documentation

## 2016-05-20 DIAGNOSIS — Z6832 Body mass index (BMI) 32.0-32.9, adult: Secondary | ICD-10-CM | POA: Diagnosis not present

## 2016-05-20 DIAGNOSIS — R030 Elevated blood-pressure reading, without diagnosis of hypertension: Secondary | ICD-10-CM | POA: Diagnosis not present

## 2016-05-20 DIAGNOSIS — M7062 Trochanteric bursitis, left hip: Secondary | ICD-10-CM | POA: Diagnosis not present

## 2016-05-20 LAB — BASIC METABOLIC PANEL
Anion gap: 9 (ref 5–15)
BUN: 8 mg/dL (ref 6–20)
CHLORIDE: 105 mmol/L (ref 101–111)
CO2: 24 mmol/L (ref 22–32)
CREATININE: 0.87 mg/dL (ref 0.44–1.00)
Calcium: 9.9 mg/dL (ref 8.9–10.3)
GFR calc non Af Amer: >60 mL/min (ref >60)
Glucose, Bld: 121 mg/dL — ABNORMAL HIGH (ref 65–99)
POTASSIUM: 4.2 mmol/L (ref 3.5–5.1)
SODIUM: 138 mmol/L (ref 135–145)

## 2016-05-20 LAB — CBC
HEMATOCRIT: 40.2 % (ref 36.0–46.0)
HEMOGLOBIN: 13.1 g/dL (ref 12.0–15.0)
MCH: 31 pg (ref 26.0–34.0)
MCHC: 32.6 g/dL (ref 30.0–36.0)
MCV: 95.3 fL (ref 78.0–100.0)
Platelets: 219 10*3/uL (ref 150–400)
RBC: 4.22 MIL/uL (ref 3.87–5.11)
RDW: 12.9 % (ref 11.5–15.5)
WBC: 6.2 10*3/uL (ref 4.0–10.5)

## 2016-05-20 LAB — SURGICAL PCR SCREEN
MRSA, PCR: NEGATIVE
STAPHYLOCOCCUS AUREUS: NEGATIVE

## 2016-05-20 NOTE — Pre-Procedure Instructions (Signed)
    Rossi Beechler Polus  05/20/2016      [facility] - [place], [place] [address] Phone: [phone] Fax: [phone]  [id number] - [place], [place] [address] [place] [address] Phone: [phone] Fax: [phone]    Your procedure is scheduled on Fri. Sept. 8  Report to [facility] Admitting at 5:30 A.M.  Call this number if you have problems the morning of surgery:  [phone]   Remember:  Do not eat food or drink liquids after midnight on Thurs. Sept. 7   Take these medicines the morning of surgery with A SIP OF WATER : xanax, prozac, robaxin if needed, myrbetriq, nitroglycerine if needed,onycodone if needed, requip             Stop vitamins/herbal medicines and NSAIDS: advil, motrin, ibuprofen,aleve,BC Powders, Goody's as of Sept. 1   Do not wear jewelry, make-up or nail polish.  Do not wear lotions, powders, or perfumes, or deoderant.  Do not shave 48 hours prior to surgery.  Men may shave face and neck.  Do not bring valuables to the hospital.  [facility] is not responsible for any belongings or valuables.  Contacts, dentures or bridgework may not be worn into surgery.  Leave your suitcase in the car.  After surgery it may be brought to your room.  For patients admitted to the hospital, discharge time will be determined by your treatment team.  Patients discharged the day of surgery will not be allowed to drive home.   Name and phone number of your driver:   Special instructions:  Review preparing for surgery  Please read over the following fact sheets that you were given. Coughing and Deep Breathing

## 2016-05-20 NOTE — Progress Notes (Signed)
PCP: DR. Gerarda Fraction @ [facility]  Pt. Doesn't see cardiologist.   Pt. Reports h/o esophageal spasms that is relieved by nitroglycerine. It's been over 1 year since last spasm.

## 2016-05-21 NOTE — Progress Notes (Signed)
Anesthesia Chart Review:  Pt is a 70 year old female scheduled for R reverse shoulder arthroplasty on 05/28/2016 with Netta Cedars, MD.   PMH includes:  MVP, rheumatic fever, heart murmur, anemia, bipolar disorder, psychotic affective disorder, renal cancer, anemia, GERD, post-op N/V. Never smoker. BMI 33. S/p maximum access PLIF 09/11/15, 07/25/14 and 01/04/13. S/p laparoscopic L nephrectomy 08/23/11.   Medications include: lipitor, cholestyramine.   Preoperative labs reviewed.   Chest x-ray 09/03/15 reviewed. Streaky basilar scarring changes but no acute pulmonary findings.  EKG 09/03/15: NSR.   Cardiac cath 03/21/01:  1. No significant CAD (ramus intermedius had 50% proximal stenosis, CX 60% distal stenosis).  2. Normal LV systolic function. EF 50-55%  By notes on cath report, pt had an echo around 02/2001 that showed normal LV function with mildly thickened aortic valve.   Pt saw Dr. Wynonia Lawman with cardiology 12/19 for pre-op eval before 09/11/15 PLIF and he cleared pt for surgery at average risk without further testing. F/u prn was recommended.   Pt tolerated PLIF last December without issue. If no changes, I anticipate pt can proceed with surgery as scheduled.   Willeen Cass, FNP-BC [facility]/Anesthesiology Phone: [phone] 05/21/2016 1:27 PM

## 2016-05-26 DIAGNOSIS — H2512 Age-related nuclear cataract, left eye: Secondary | ICD-10-CM | POA: Diagnosis not present

## 2016-05-26 DIAGNOSIS — H538 Other visual disturbances: Secondary | ICD-10-CM | POA: Diagnosis not present

## 2016-05-27 MED ORDER — CEFAZOLIN SODIUM-DEXTROSE 2-4 GM/100ML-% IV SOLN
2.0000 g | INTRAVENOUS | Status: AC
  Administered 2016-05-28: 2 g via INTRAVENOUS
  Filled 2016-05-27: qty 100

## 2016-05-27 NOTE — Anesthesia Preprocedure Evaluation (Signed)
Anesthesia Evaluation  Patient identified by MRN, date of birth, ID band Patient awake    Reviewed: Allergy & Precautions, NPO status , Patient's Chart, lab work & pertinent test results  History of Anesthesia Complications (+) PONV and history of anesthetic complications  Airway Mallampati: III  TM Distance: <3 FB Neck ROM: Full    Dental  (+) Edentulous Upper, Edentulous Lower   Pulmonary neg pulmonary ROS,    breath sounds clear to auscultation       Cardiovascular negative cardio ROS   Rhythm:Regular     Neuro/Psych neg Seizures PSYCHIATRIC DISORDERS Anxiety Depression Bipolar Disorder  Neuromuscular disease    GI/Hepatic Neg liver ROS, GERD  Controlled,  Endo/Other  Morbid obesity  Renal/GU negative Renal ROS     Musculoskeletal  (+) Arthritis ,   Abdominal   Peds  Hematology negative hematology ROS (+)   Anesthesia Other Findings   Reproductive/Obstetrics                             Anesthesia Physical  Anesthesia Plan  ASA: II  Anesthesia Plan: General and Regional   Post-op Pain Management:  Regional for Post-op pain   Induction: Intravenous  Airway Management Planned: Oral ETT  Additional Equipment: None  Intra-op Plan:   Post-operative Plan: Extubation in OR  Informed Consent: I have reviewed the patients History and Physical, chart, labs and discussed the procedure including the risks, benefits and alternatives for the proposed anesthesia with the patient or authorized representative who has indicated his/her understanding and acceptance.   Dental advisory given  Plan Discussed with: CRNA and Surgeon  Anesthesia Plan Comments:         Anesthesia Quick Evaluation

## 2016-05-28 ENCOUNTER — Encounter (HOSPITAL_COMMUNITY): Payer: Self-pay | Admitting: Urology

## 2016-05-28 ENCOUNTER — Inpatient Hospital Stay (HOSPITAL_COMMUNITY)
Admission: RE | Admit: 2016-05-28 | Discharge: 2016-05-29 | DRG: 483 | Disposition: A | Discharge status: Home / Self Care | Payer: Medicare Other | Source: Ambulatory Visit | Attending: Orthopedic Surgery | Admitting: Orthopedic Surgery

## 2016-05-28 ENCOUNTER — Encounter (HOSPITAL_COMMUNITY)
Admission: RE | Disposition: A | Discharge status: Home / Self Care | Payer: Self-pay | Source: Ambulatory Visit | Attending: Orthopedic Surgery

## 2016-05-28 ENCOUNTER — Inpatient Hospital Stay (HOSPITAL_COMMUNITY): Payer: Medicare Other | Admitting: Anesthesiology

## 2016-05-28 ENCOUNTER — Inpatient Hospital Stay (HOSPITAL_COMMUNITY): Payer: Medicare Other

## 2016-05-28 ENCOUNTER — Inpatient Hospital Stay (HOSPITAL_COMMUNITY): Payer: Medicare Other | Admitting: Emergency Medicine

## 2016-05-28 DIAGNOSIS — M810 Age-related osteoporosis without current pathological fracture: Secondary | ICD-10-CM | POA: Diagnosis present

## 2016-05-28 DIAGNOSIS — Z905 Acquired absence of kidney: Secondary | ICD-10-CM | POA: Diagnosis not present

## 2016-05-28 DIAGNOSIS — F319 Bipolar disorder, unspecified: Secondary | ICD-10-CM | POA: Diagnosis present

## 2016-05-28 DIAGNOSIS — K219 Gastro-esophageal reflux disease without esophagitis: Secondary | ICD-10-CM | POA: Diagnosis present

## 2016-05-28 DIAGNOSIS — Z981 Arthrodesis status: Secondary | ICD-10-CM

## 2016-05-28 DIAGNOSIS — F419 Anxiety disorder, unspecified: Secondary | ICD-10-CM | POA: Diagnosis present

## 2016-05-28 DIAGNOSIS — G8918 Other acute postprocedural pain: Secondary | ICD-10-CM | POA: Diagnosis not present

## 2016-05-28 DIAGNOSIS — G2581 Restless legs syndrome: Secondary | ICD-10-CM | POA: Diagnosis present

## 2016-05-28 DIAGNOSIS — M19011 Primary osteoarthritis, right shoulder: Secondary | ICD-10-CM | POA: Diagnosis not present

## 2016-05-28 DIAGNOSIS — M549 Dorsalgia, unspecified: Secondary | ICD-10-CM | POA: Diagnosis present

## 2016-05-28 DIAGNOSIS — Z23 Encounter for immunization: Secondary | ICD-10-CM

## 2016-05-28 DIAGNOSIS — Z85528 Personal history of other malignant neoplasm of kidney: Secondary | ICD-10-CM | POA: Diagnosis not present

## 2016-05-28 DIAGNOSIS — M129 Arthropathy, unspecified: Secondary | ICD-10-CM | POA: Diagnosis present

## 2016-05-28 DIAGNOSIS — I341 Nonrheumatic mitral (valve) prolapse: Secondary | ICD-10-CM | POA: Diagnosis present

## 2016-05-28 DIAGNOSIS — M75101 Unspecified rotator cuff tear or rupture of right shoulder, not specified as traumatic: Secondary | ICD-10-CM | POA: Diagnosis present

## 2016-05-28 DIAGNOSIS — Z96611 Presence of right artificial shoulder joint: Secondary | ICD-10-CM | POA: Diagnosis not present

## 2016-05-28 DIAGNOSIS — Z96619 Presence of unspecified artificial shoulder joint: Secondary | ICD-10-CM

## 2016-05-28 DIAGNOSIS — G8929 Other chronic pain: Secondary | ICD-10-CM | POA: Diagnosis present

## 2016-05-28 DIAGNOSIS — Z471 Aftercare following joint replacement surgery: Secondary | ICD-10-CM | POA: Diagnosis not present

## 2016-05-28 DIAGNOSIS — M25511 Pain in right shoulder: Secondary | ICD-10-CM | POA: Diagnosis not present

## 2016-05-28 HISTORY — PX: REVERSE SHOULDER ARTHROPLASTY: SHX5054

## 2016-05-28 SURGERY — ARTHROPLASTY, SHOULDER, TOTAL, REVERSE
Anesthesia: Regional | Site: Shoulder | Laterality: Right

## 2016-05-28 MED ORDER — SODIUM CHLORIDE 0.9 % IV SOLN
INTRAVENOUS | Status: DC
  Administered 2016-05-28: 14:00:00 via INTRAVENOUS

## 2016-05-28 MED ORDER — OLANZAPINE 7.5 MG PO TABS
15.0000 mg | ORAL_TABLET | Freq: Every day | ORAL | Status: DC
  Administered 2016-05-28: 15 mg via ORAL
  Filled 2016-05-28: qty 2

## 2016-05-28 MED ORDER — EPHEDRINE SULFATE-NACL 50-0.9 MG/10ML-% IV SOSY
PREFILLED_SYRINGE | INTRAVENOUS | Status: DC | PRN
  Administered 2016-05-28 (×2): 5 mg via INTRAVENOUS

## 2016-05-28 MED ORDER — DEXAMETHASONE SODIUM PHOSPHATE 10 MG/ML IJ SOLN
INTRAMUSCULAR | Status: AC
  Filled 2016-05-28: qty 1

## 2016-05-28 MED ORDER — OXYCODONE-ACETAMINOPHEN 5-325 MG PO TABS: 1.0000 | ORAL_TABLET | ORAL | 0 refills | Status: DC | PRN

## 2016-05-28 MED ORDER — FLUOXETINE HCL 20 MG PO CAPS
40.0000 mg | ORAL_CAPSULE | Freq: Two times a day (BID) | ORAL | Status: DC
  Administered 2016-05-28 – 2016-05-29 (×2): 40 mg via ORAL
  Filled 2016-05-28 (×3): qty 2

## 2016-05-28 MED ORDER — CEFAZOLIN SODIUM-DEXTROSE 2-4 GM/100ML-% IV SOLN
2.0000 g | Freq: Four times a day (QID) | INTRAVENOUS | Status: AC
  Administered 2016-05-28 – 2016-05-29 (×3): 2 g via INTRAVENOUS
  Filled 2016-05-28 (×4): qty 100

## 2016-05-28 MED ORDER — BUPIVACAINE-EPINEPHRINE 0.25% -1:200000 IJ SOLN
INTRAMUSCULAR | Status: DC | PRN
  Administered 2016-05-28: 5 mL
  Administered 2016-05-28: 7 mL

## 2016-05-28 MED ORDER — PROPOFOL 10 MG/ML IV BOLUS
INTRAVENOUS | Status: AC
  Filled 2016-05-28: qty 20

## 2016-05-28 MED ORDER — METOCLOPRAMIDE HCL 5 MG/ML IJ SOLN: 5.0000 mg | Freq: Three times a day (TID) | INTRAMUSCULAR | Status: DC | PRN

## 2016-05-28 MED ORDER — FENTANYL CITRATE (PF) 100 MCG/2ML IJ SOLN
INTRAMUSCULAR | Status: AC
  Filled 2016-05-28: qty 2

## 2016-05-28 MED ORDER — MIRABEGRON ER 25 MG PO TB24
50.0000 mg | ORAL_TABLET | Freq: Every day | ORAL | Status: DC
  Administered 2016-05-28 – 2016-05-29 (×2): 50 mg via ORAL
  Filled 2016-05-28 (×2): qty 2

## 2016-05-28 MED ORDER — CHLORHEXIDINE GLUCONATE 4 % EX LIQD: 60.0000 mL | Freq: Once | CUTANEOUS | Status: DC

## 2016-05-28 MED ORDER — POLYETHYLENE GLYCOL 3350 17 G PO PACK: 17.0000 g | PACK | Freq: Every day | ORAL | Status: DC | PRN

## 2016-05-28 MED ORDER — MENTHOL 3 MG MT LOZG: 1.0000 | LOZENGE | OROMUCOSAL | Status: DC | PRN

## 2016-05-28 MED ORDER — LACTATED RINGERS IV SOLN
INTRAVENOUS | Status: DC | PRN
  Administered 2016-05-28 (×2): via INTRAVENOUS

## 2016-05-28 MED ORDER — MIDAZOLAM HCL 2 MG/2ML IJ SOLN
INTRAMUSCULAR | Status: AC
  Filled 2016-05-28: qty 2

## 2016-05-28 MED ORDER — ALPRAZOLAM 0.5 MG PO TABS
1.0000 mg | ORAL_TABLET | Freq: Four times a day (QID) | ORAL | Status: DC
  Administered 2016-05-28 – 2016-05-29 (×5): 1 mg via ORAL
  Filled 2016-05-28 (×5): qty 2

## 2016-05-28 MED ORDER — ONDANSETRON HCL 4 MG/2ML IJ SOLN: 4.0000 mg | Freq: Four times a day (QID) | INTRAMUSCULAR | Status: DC | PRN

## 2016-05-28 MED ORDER — ACETAMINOPHEN 650 MG RE SUPP: 650.0000 mg | Freq: Four times a day (QID) | RECTAL | Status: DC | PRN

## 2016-05-28 MED ORDER — ROPINIROLE HCL 1 MG PO TABS
3.0000 mg | ORAL_TABLET | Freq: Three times a day (TID) | ORAL | Status: DC
  Administered 2016-05-28 – 2016-05-29 (×2): 3 mg via ORAL
  Filled 2016-05-28: qty 3
  Filled 2016-05-28: qty 6
  Filled 2016-05-28: qty 3

## 2016-05-28 MED ORDER — BUPIVACAINE-EPINEPHRINE (PF) 0.5% -1:200000 IJ SOLN
INTRAMUSCULAR | Status: DC | PRN
  Administered 2016-05-28: 25 mL

## 2016-05-28 MED ORDER — EPHEDRINE 5 MG/ML INJ
INTRAVENOUS | Status: AC
  Filled 2016-05-28: qty 10

## 2016-05-28 MED ORDER — SCOPOLAMINE 1 MG/3DAYS TD PT72
MEDICATED_PATCH | TRANSDERMAL | Status: AC
  Filled 2016-05-28: qty 1

## 2016-05-28 MED ORDER — MIDAZOLAM HCL 5 MG/5ML IJ SOLN
INTRAMUSCULAR | Status: DC | PRN
  Administered 2016-05-28: 2 mg via INTRAVENOUS

## 2016-05-28 MED ORDER — METOCLOPRAMIDE HCL 5 MG PO TABS: 5.0000 mg | ORAL_TABLET | Freq: Three times a day (TID) | ORAL | Status: DC | PRN

## 2016-05-28 MED ORDER — TRIAMCINOLONE ACETONIDE 40 MG/ML IJ SUSP
INTRAMUSCULAR | Status: DC | PRN
  Administered 2016-05-28: 40 mg

## 2016-05-28 MED ORDER — DOCUSATE SODIUM 100 MG PO CAPS
100.0000 mg | ORAL_CAPSULE | Freq: Two times a day (BID) | ORAL | Status: DC
  Administered 2016-05-28 – 2016-05-29 (×3): 100 mg via ORAL
  Filled 2016-05-28 (×3): qty 1

## 2016-05-28 MED ORDER — LIDOCAINE 2% (20 MG/ML) 5 ML SYRINGE
INTRAMUSCULAR | Status: DC | PRN
  Administered 2016-05-28: 80 mg via INTRAVENOUS
  Administered 2016-05-28: 60 mg via INTRAVENOUS

## 2016-05-28 MED ORDER — PHENOL 1.4 % MT LIQD
1.0000 | OROMUCOSAL | Status: DC | PRN
Start: 2016-05-28 — End: 2016-05-29

## 2016-05-28 MED ORDER — ONDANSETRON HCL 4 MG PO TABS: 4.0000 mg | ORAL_TABLET | Freq: Four times a day (QID) | ORAL | Status: DC | PRN

## 2016-05-28 MED ORDER — PNEUMOCOCCAL VAC POLYVALENT 25 MCG/0.5ML IJ INJ
0.5000 mL | INJECTION | INTRAMUSCULAR | Status: AC
  Administered 2016-05-29: 0.5 mL via INTRAMUSCULAR

## 2016-05-28 MED ORDER — SUCCINYLCHOLINE CHLORIDE 200 MG/10ML IV SOSY
PREFILLED_SYRINGE | INTRAVENOUS | Status: DC | PRN
  Administered 2016-05-28: 100 mg via INTRAVENOUS

## 2016-05-28 MED ORDER — SCOPOLAMINE 1 MG/3DAYS TD PT72
MEDICATED_PATCH | TRANSDERMAL | Status: DC | PRN
  Administered 2016-05-28: 1 via TRANSDERMAL

## 2016-05-28 MED ORDER — PHENYLEPHRINE 40 MCG/ML (10ML) SYRINGE FOR IV PUSH (FOR BLOOD PRESSURE SUPPORT)
PREFILLED_SYRINGE | INTRAVENOUS | Status: DC | PRN
  Administered 2016-05-28: 40 ug via INTRAVENOUS

## 2016-05-28 MED ORDER — ACETAMINOPHEN 325 MG PO TABS: 650.0000 mg | ORAL_TABLET | Freq: Four times a day (QID) | ORAL | Status: DC | PRN

## 2016-05-28 MED ORDER — MORPHINE SULFATE (PF) 2 MG/ML IV SOLN
2.0000 mg | INTRAVENOUS | Status: DC | PRN
  Administered 2016-05-29: 2 mg via INTRAVENOUS
  Filled 2016-05-28: qty 1

## 2016-05-28 MED ORDER — TRIAMCINOLONE ACETONIDE 40 MG/ML IJ SUSP
INTRAMUSCULAR | Status: AC
  Filled 2016-05-28: qty 5

## 2016-05-28 MED ORDER — CHOLESTYRAMINE 4 G PO PACK
4.0000 g | PACK | Freq: Every day | ORAL | Status: DC
  Administered 2016-05-28 – 2016-05-29 (×2): 4 g via ORAL
  Filled 2016-05-28 (×2): qty 1

## 2016-05-28 MED ORDER — 0.9 % SODIUM CHLORIDE (POUR BTL) OPTIME
TOPICAL | Status: DC | PRN
  Administered 2016-05-28: 1000 mL

## 2016-05-28 MED ORDER — FENTANYL CITRATE (PF) 100 MCG/2ML IJ SOLN: 25.0000 ug | INTRAMUSCULAR | Status: DC | PRN

## 2016-05-28 MED ORDER — ONDANSETRON HCL 4 MG/2ML IJ SOLN
INTRAMUSCULAR | Status: AC
  Filled 2016-05-28: qty 4

## 2016-05-28 MED ORDER — ATORVASTATIN CALCIUM 10 MG PO TABS
10.0000 mg | ORAL_TABLET | Freq: Every day | ORAL | Status: DC
  Administered 2016-05-28: 10 mg via ORAL
  Filled 2016-05-28: qty 1

## 2016-05-28 MED ORDER — PROPOFOL 10 MG/ML IV BOLUS
INTRAVENOUS | Status: DC | PRN
  Administered 2016-05-28: 160 mg via INTRAVENOUS

## 2016-05-28 MED ORDER — DEXAMETHASONE SODIUM PHOSPHATE 10 MG/ML IJ SOLN
INTRAMUSCULAR | Status: DC | PRN
  Administered 2016-05-28: 10 mg via INTRAVENOUS

## 2016-05-28 MED ORDER — DIPHENHYDRAMINE HCL 50 MG/ML IJ SOLN
INTRAMUSCULAR | Status: AC
Start: 2016-05-28 — End: 2016-05-28
  Filled 2016-05-28: qty 1

## 2016-05-28 MED ORDER — FENTANYL CITRATE (PF) 100 MCG/2ML IJ SOLN
INTRAMUSCULAR | Status: DC | PRN
  Administered 2016-05-28 (×2): 50 ug via INTRAVENOUS

## 2016-05-28 MED ORDER — METHOCARBAMOL 500 MG PO TABS: 750.0000 mg | ORAL_TABLET | Freq: Four times a day (QID) | ORAL | 1 refills | Status: DC | PRN

## 2016-05-28 MED ORDER — BUPIVACAINE-EPINEPHRINE (PF) 0.25% -1:200000 IJ SOLN
INTRAMUSCULAR | Status: AC
  Filled 2016-05-28: qty 30

## 2016-05-28 MED ORDER — DEXTROSE 5 % IV SOLN
INTRAVENOUS | Status: DC | PRN
  Administered 2016-05-28: 25 ug/min via INTRAVENOUS

## 2016-05-28 MED ORDER — METHOCARBAMOL 750 MG PO TABS
750.0000 mg | ORAL_TABLET | Freq: Every day | ORAL | Status: DC | PRN
  Administered 2016-05-28 – 2016-05-29 (×2): 750 mg via ORAL
  Filled 2016-05-28 (×2): qty 1

## 2016-05-28 MED ORDER — ONDANSETRON HCL 4 MG/2ML IJ SOLN
INTRAMUSCULAR | Status: DC | PRN
  Administered 2016-05-28: 4 mg via INTRAVENOUS

## 2016-05-28 MED ORDER — DIPHENHYDRAMINE HCL 50 MG/ML IJ SOLN
INTRAMUSCULAR | Status: DC | PRN
  Administered 2016-05-28: 12.5 mg via INTRAVENOUS

## 2016-05-28 MED ORDER — NITROGLYCERIN 0.4 MG SL SUBL: 0.4000 mg | SUBLINGUAL_TABLET | SUBLINGUAL | Status: DC | PRN

## 2016-05-28 MED ORDER — ADULT MULTIVITAMIN W/MINERALS CH
1.0000 | ORAL_TABLET | Freq: Every evening | ORAL | Status: DC
  Administered 2016-05-28: 1 via ORAL
  Filled 2016-05-28: qty 1

## 2016-05-28 MED ORDER — OXYCODONE-ACETAMINOPHEN 5-325 MG PO TABS
1.0000 | ORAL_TABLET | ORAL | Status: DC | PRN
  Administered 2016-05-28 – 2016-05-29 (×4): 1 via ORAL
  Filled 2016-05-28 (×4): qty 1

## 2016-05-28 MED ORDER — PHENYLEPHRINE 40 MCG/ML (10ML) SYRINGE FOR IV PUSH (FOR BLOOD PRESSURE SUPPORT)
PREFILLED_SYRINGE | INTRAVENOUS | Status: AC
  Filled 2016-05-28: qty 10

## 2016-05-28 MED ORDER — PROMETHAZINE HCL 25 MG/ML IJ SOLN: 6.2500 mg | INTRAMUSCULAR | Status: DC | PRN

## 2016-05-28 SURGICAL SUPPLY — 69 items
BIT DRILL 170X2.5X (BIT) IMPLANT
BIT DRILL 5/64X5 DISP (BIT) ×1 IMPLANT
BIT DRL 170X2.5X (BIT) ×1
BLADE SAG 18X100X1.27 (BLADE) ×2 IMPLANT
CAPT SHLDR REVTOTAL 1 ×1 IMPLANT
COVER SURGICAL LIGHT HANDLE (MISCELLANEOUS) ×2 IMPLANT
DRAPE IMP U-DRAPE 54X76 (DRAPES) ×4 IMPLANT
DRAPE INCISE IOBAN 66X45 STRL (DRAPES) ×2 IMPLANT
DRAPE ORTHO SPLIT 77X108 STRL (DRAPES) ×4
DRAPE SURG ORHT 6 SPLT 77X108 (DRAPES) ×2 IMPLANT
DRAPE U-SHAPE 47X51 STRL (DRAPES) ×2 IMPLANT
DRAPE X-RAY CASS 24X20 (DRAPES) IMPLANT
DRILL 2.5 (BIT) ×2
DRSG ADAPTIC 3X8 NADH LF (GAUZE/BANDAGES/DRESSINGS) ×2 IMPLANT
DRSG PAD ABDOMINAL 8X10 ST (GAUZE/BANDAGES/DRESSINGS) ×2 IMPLANT
DURAPREP 26ML APPLICATOR (WOUND CARE) ×2 IMPLANT
ELECT BLADE 4.0 EZ CLEAN MEGAD (MISCELLANEOUS) ×2
ELECT NDL TIP 2.8 STRL (NEEDLE) ×1 IMPLANT
ELECT NEEDLE TIP 2.8 STRL (NEEDLE) ×2 IMPLANT
ELECT REM PT RETURN 9FT ADLT (ELECTROSURGICAL) ×2
ELECTRODE BLDE 4.0 EZ CLN MEGD (MISCELLANEOUS) ×1 IMPLANT
ELECTRODE REM PT RTRN 9FT ADLT (ELECTROSURGICAL) ×1 IMPLANT
GAUZE SPONGE 4X4 12PLY STRL (GAUZE/BANDAGES/DRESSINGS) ×2 IMPLANT
GLOVE BIOGEL PI ORTHO PRO 7.5 (GLOVE) ×1
GLOVE BIOGEL PI ORTHO PRO SZ8 (GLOVE) ×1
GLOVE ORTHO TXT STRL SZ7.5 (GLOVE) ×2 IMPLANT
GLOVE PI ORTHO PRO STRL 7.5 (GLOVE) ×1 IMPLANT
GLOVE PI ORTHO PRO STRL SZ8 (GLOVE) ×1 IMPLANT
GLOVE SURG ORTHO 8.5 STRL (GLOVE) ×2 IMPLANT
GOWN STRL REUS W/ TWL LRG LVL3 (GOWN DISPOSABLE) ×1 IMPLANT
GOWN STRL REUS W/ TWL XL LVL3 (GOWN DISPOSABLE) ×2 IMPLANT
GOWN STRL REUS W/TWL LRG LVL3 (GOWN DISPOSABLE) ×2
GOWN STRL REUS W/TWL XL LVL3 (GOWN DISPOSABLE) ×4
HANDPIECE INTERPULSE COAX TIP (DISPOSABLE)
KIT BASIN OR (CUSTOM PROCEDURE TRAY) ×2 IMPLANT
KIT ROOM TURNOVER OR (KITS) ×2 IMPLANT
MANIFOLD NEPTUNE II (INSTRUMENTS) ×2 IMPLANT
NDL 1/2 CIR MAYO (NEEDLE) ×1 IMPLANT
NDL HYPO 25GX1X1/2 BEV (NEEDLE) ×1 IMPLANT
NEEDLE 1/2 CIR MAYO (NEEDLE) ×2 IMPLANT
NEEDLE HYPO 25GX1X1/2 BEV (NEEDLE) ×2 IMPLANT
NS IRRIG 1000ML POUR BTL (IV SOLUTION) ×2 IMPLANT
PACK SHOULDER (CUSTOM PROCEDURE TRAY) ×2 IMPLANT
PAD ARMBOARD 7.5X6 YLW CONV (MISCELLANEOUS) ×4 IMPLANT
PIN GUIDE 1.2 (PIN) ×1 IMPLANT
PIN GUIDE GLENOPHERE 1.5MX300M (PIN) ×1 IMPLANT
PIN METAGLENE 2.5 (PIN) ×1 IMPLANT
SET HNDPC FAN SPRY TIP SCT (DISPOSABLE) IMPLANT
SLING ARM LRG ADULT FOAM STRAP (SOFTGOODS) ×1 IMPLANT
SLING ARM MED ADULT FOAM STRAP (SOFTGOODS) IMPLANT
SPONGE LAP 18X18 X RAY DECT (DISPOSABLE) IMPLANT
SPONGE LAP 4X18 X RAY DECT (DISPOSABLE) ×2 IMPLANT
STRIP CLOSURE SKIN 1/2X4 (GAUZE/BANDAGES/DRESSINGS) ×2 IMPLANT
SUCTION FRAZIER HANDLE 10FR (MISCELLANEOUS) ×1
SUCTION TUBE FRAZIER 10FR DISP (MISCELLANEOUS) ×1 IMPLANT
SUT FIBERWIRE #2 38 T-5 BLUE (SUTURE) ×4
SUT MNCRL AB 4-0 PS2 18 (SUTURE) ×2 IMPLANT
SUT VIC AB 0 CT2 27 (SUTURE) ×1 IMPLANT
SUT VIC AB 2-0 CT1 27 (SUTURE) ×2
SUT VIC AB 2-0 CT1 TAPERPNT 27 (SUTURE) ×1 IMPLANT
SUT VICRYL 0 CT 1 36IN (SUTURE) ×2 IMPLANT
SUTURE FIBERWR #2 38 T-5 BLUE (SUTURE) ×2 IMPLANT
SYR CONTROL 10ML LL (SYRINGE) ×2 IMPLANT
TOWEL OR 17X24 6PK STRL BLUE (TOWEL DISPOSABLE) ×2 IMPLANT
TOWEL OR 17X26 10 PK STRL BLUE (TOWEL DISPOSABLE) ×2 IMPLANT
TOWER CARTRIDGE SMART MIX (DISPOSABLE) IMPLANT
TRAY FOLEY CATH 16FRSI W/METER (SET/KITS/TRAYS/PACK) IMPLANT
WATER STERILE IRR 1000ML POUR (IV SOLUTION) ×2 IMPLANT
YANKAUER SUCT BULB TIP NO VENT (SUCTIONS) ×2 IMPLANT

## 2016-05-28 NOTE — Anesthesia Procedure Notes (Signed)
Procedure Name: Intubation Date/Time: 05/28/2016 7:47 AM Performed by: Freddie Breech Pre-anesthesia Checklist: Patient identified, Emergency Drugs available, Suction available and Patient being monitored Patient Re-evaluated:Patient Re-evaluated prior to inductionOxygen Delivery Method: Circle System Utilized Preoxygenation: Pre-oxygenation with 100% oxygen Intubation Type: IV induction Ventilation: Mask ventilation without difficulty Laryngoscope Size: Mac and 3 Grade View: Grade I Tube type: Oral Tube size: 7.0 mm Number of attempts: 1 Airway Equipment and Method: Stylet and Oral airway Placement Confirmation: ETT inserted through vocal cords under direct vision,  positive ETCO2 and breath sounds checked- equal and bilateral Secured at: 21 cm Tube secured with: Tape Dental Injury: Teeth and Oropharynx as per pre-operative assessment

## 2016-05-28 NOTE — Brief Op Note (Signed)
05/28/2016  9:34 AM  PATIENT:  Victoria Mccarthy  70 y.o. female  PRE-OPERATIVE DIAGNOSIS:  RIGHT SHOULDER ROTATOR CUFF ARTHROPATHY  POST-OPERATIVE DIAGNOSIS:  RIGHT SHOULDER ROTATOR CUFF ARTHROPATHY  PROCEDURE:  Procedure(s): RIGHT REVERSE SHOULDER ARTHROPLASTY (Right) DePuy Delta Xtend  SURGEON:  Surgeon(s) and Role:    * Netta Cedars, MD - Primary  PHYSICIAN ASSISTANT:   ASSISTANTS: Ventura Bruns, PA-C   ANESTHESIA:   regional and general  EBL:  Total I/O In: 1000 [I.V.:1000] Out: 150 [Blood:150]  BLOOD ADMINISTERED:none  DRAINS: none   LOCAL MEDICATIONS USED:  MARCAINE     SPECIMEN:  No Specimen  DISPOSITION OF SPECIMEN:  N/A  COUNTS:  YES  TOURNIQUET:  * No tourniquets in log *  DICTATION: .Other Dictation: Dictation Number [id number]  PLAN OF CARE: Admit to inpatient   PATIENT DISPOSITION:  PACU - hemodynamically stable.   Delay start of Pharmacological VTE agent (>24hrs) due to surgical blood loss or risk of bleeding: not applicable

## 2016-05-28 NOTE — Anesthesia Procedure Notes (Signed)
Anesthesia Regional Block:  Interscalene brachial plexus block  Pre-Anesthetic Checklist: ,, timeout performed, Correct Patient, Correct Site, Correct Laterality, Correct Procedure, Correct Position, site marked, Risks and benefits discussed,  Surgical consent,  Pre-op evaluation,  At surgeon's request and post-op pain management  Laterality: Right  Prep: chloraprep       Needles:  Injection technique: Single-shot  Needle Type: Echogenic Needle     Needle Length: 5cm 5 cm Needle Gauge: 22 and 22 G    Additional Needles:  Procedures: ultrasound guided (picture in chart) Interscalene brachial plexus block Narrative:  Start time: 05/28/2016 7:10 AM End time: 05/28/2016 7:17 AM Injection made incrementally with aspirations every 25 mL.  Performed by: Personally  Anesthesiologist: [name]

## 2016-05-28 NOTE — Interval H&P Note (Signed)
History and Physical Interval Note:  05/28/2016 7:26 AM  Victoria Mccarthy  has presented today for surgery, with the diagnosis of RIGHT SHOULDER ROTATOR CUFF ARTHROPATHY  The various methods of treatment have been discussed with the patient and family. After consideration of risks, benefits and other options for treatment, the patient has consented to  Procedure(s): RIGHT REVERSE SHOULDER ARTHROPLASTY (Right) as a surgical intervention .  The patient's history has been reviewed, patient examined, no change in status, stable for surgery.  I have reviewed the patient's chart and labs.  Questions were answered to the patient's satisfaction.     Samaya Boardley,STEVEN R

## 2016-05-28 NOTE — Transfer of Care (Signed)
Immediate Anesthesia Transfer of Care Note  Patient: Victoria Mccarthy  Procedure(s) Performed: Procedure(s): RIGHT REVERSE SHOULDER ARTHROPLASTY (Right)  Patient Location: PACU  Anesthesia Type:General  Level of Consciousness:  sedated, patient cooperative and responds to stimulation  Airway & Oxygen Therapy:Patient Spontanous Breathing and Patient connected to face mask oxgen  Post-op Assessment:  Report given to PACU RN and Post -op Vital signs reviewed and stable  Post vital signs:  Reviewed and stable  Last Vitals:  Vitals:   05/28/16 0612 05/28/16 0940  BP: (!) 110/59   Pulse: 80   Resp: 18   Temp: 37 C (P) 123456 C    Complications: No apparent anesthesia complications

## 2016-05-28 NOTE — Discharge Instructions (Signed)
Ice to the shoulder as much as possible.  May remove the sling and use the arm for light activity.  Do NOT push out of a chair with the right arm.  Keep the incision covered and clean and dry for one week, then ok to get it wet in the shower.  Follow up with Dr Veverly Fells in the office in two weeks  [phone]

## 2016-05-29 ENCOUNTER — Encounter (HOSPITAL_COMMUNITY): Payer: Self-pay

## 2016-05-29 DIAGNOSIS — M75101 Unspecified rotator cuff tear or rupture of right shoulder, not specified as traumatic: Secondary | ICD-10-CM | POA: Diagnosis not present

## 2016-05-29 LAB — BASIC METABOLIC PANEL
Anion gap: 10 (ref 5–15)
BUN: 11 mg/dL (ref 6–20)
CALCIUM: 9.2 mg/dL (ref 8.9–10.3)
CO2: 25 mmol/L (ref 22–32)
Chloride: 103 mmol/L (ref 101–111)
Creatinine, Ser: 0.79 mg/dL (ref 0.44–1.00)
GFR calc Af Amer: >60 mL/min (ref >60)
GLUCOSE: 148 mg/dL — AB (ref 65–99)
POTASSIUM: 4.2 mmol/L (ref 3.5–5.1)
SODIUM: 138 mmol/L (ref 135–145)

## 2016-05-29 LAB — HEMOGLOBIN AND HEMATOCRIT, BLOOD
HEMATOCRIT: 36 % (ref 36.0–46.0)
HEMOGLOBIN: 11.2 g/dL — AB (ref 12.0–15.0)

## 2016-05-29 NOTE — Progress Notes (Signed)
Discharge instructions and prescriptions provided to patient.  Dressing changed per MD order.  Additional dressing provided to patient to change in 3 days per MD order.  Pain medication provided for pain relief.  No questions at time of discharge.

## 2016-05-29 NOTE — Progress Notes (Addendum)
     Subjective: 1 Day Post-Op Procedure(s) (LRB): RIGHT REVERSE SHOULDER ARTHROPLASTY (Right)   Patient reports pain as mild, pain control. Patient has had previous shoulder surgery and understands feeling of the block. Patient states that she still is experiencing some of the block with some numbness in her thumb. Has good sensation in the for other fingers. Patient understands there of any increase in pain after the block wears off as this is previously occurred with previous surgeries. Patient feels that they're ready to go home, feels they have a good care system at home.  Objective:   VITALS:   Vitals:   05/29/16 0016 05/29/16 0423  BP: 129/60 (!) 121/51  Pulse: 87 81  Resp: 18 18  Temp: 97.5 F (36.4 C) 97.9 F (36.6 C)    Sensation intact distally Intact pulses distally Incision: no drainage No cellulitis present Compartment soft  LABS  Recent Labs  05/29/16 0310  HGB 11.2*  HCT 36.0     Recent Labs  05/29/16 0310  NA 138  K 4.2  BUN 11  CREATININE 0.79  GLUCOSE 148*     Assessment/Plan: 1 Day Post-Op Procedure(s) (LRB): RIGHT REVERSE SHOULDER ARTHROPLASTY (Right)  Up with therapy Discharge home with home health  Follow up in 2 weeks at [facility]. Follow up with Dr Veverly Fells in 2 weeks.  Contact information:  [facility] [address] [place]   PAC  05/29/2016, 9:51 AM  Agree with above.  Pain controlled. Plan discharge to home after therapy.  Follow up in the office in two weeks.  Gel bandage prior to D/C

## 2016-05-29 NOTE — Op Note (Signed)
NAMEEVOLETT, KOTLARZ               ACCOUNT NO.:  [id number]  MEDICAL RECORD NO.:  [id number]  LOCATION:  5N05C                        FACILITY:  [facility] [facility]  PHYSICIAN:  Doran Heater. Veverly Fells, M.D. DATE OF BIRTH:  10/31/1945  DATE OF PROCEDURE:  05/28/2016 DATE OF DISCHARGE:                              OPERATIVE REPORT   PREOPERATIVE DIAGNOSIS:  Right shoulder rotator cuff tear arthropathy.  POSTOPERATIVE DIAGNOSIS:  Right shoulder rotator cuff tear arthropathy.  PROCEDURE PERFORMED:  Right shoulder reverse total shoulder arthroplasty using DePuy Delta Xtend prosthesis.  ATTENDING SURGEON:  Doran Heater. Veverly Fells, M.D.  ASSISTANT:  Abbott Pao. Dixon, PA-C, who scrubbed the entire procedure and was necessary for satisfactory completion of surgery.  ANESTHESIA:  General anesthesia was used plus interscalene block.  ESTIMATED BLOOD LOSS:  Less than 100 mL.  FLUID REPLACEMENT:  1200 mL crystalloid.  INSTRUMENT COUNTS:  Correct.  COMPLICATIONS:  There were no complications.  ANTIBIOTICS:  Perioperative antibiotics were given.  INDICATIONS:  The patient is a 70 year old female with worsening right shoulder pain.  She has a history of prior right shoulder rotator cuff surgery.  She presents now with pseudoparalysis and evidence of rotator cuff tear arthropathy on x-ray and MRI.  The patient has had progressive pain despite conservative management, desires operative treatment to restore function and eliminate pain, informed consent obtained.  DESCRIPTION OF PROCEDURE:  After an adequate level of anesthesia achieved, the patient was positioned in modified beach-chair position. Right shoulder was correctly identified and sterilely prepped and draped in usual manner.  Time-out was called.  We entered the shoulder using standard deltopectoral incision started at the coracoid process, extending down to the anterior humerus.  Dissection carried out down through the subcutaneous tissues using Bovie  electrocautery.  Cephalic vein identified, taken laterally with the deltoid, pectoralis was taken medially.  Conjoint tendon identified and retracted medially.  We went ahead and released what was left of the subscapularis, which was poor tissue quality and tagged that for retraction.  We then did a release of the inferior capsule off the neck of the humerus, progressively externally rotating.  We then extended the shoulder, delivered the humerus out of the wound.  There was a little bit of posterior aspect of the infraspinatus, that was shredded and some suture, we removed that. Teres minor was also seemed to be repaired.  We removed all suture that we could see and left what we felt like might be functional teres minor on the back, freed it up with my finger on the bursal surface and also on the joint side.  Then, we went ahead and entered the proximal humerus using the 6-mm reamer and then reamed up to a size 10.  We then placed our 10-mm intramedullary guide for our head resection.  We resected 10 degrees of retroversion with the oscillating saw.  Next, we went ahead and removed excess osteophytes off the inferior medial humerus and then did our metaphyseal reaming for the epi-1 right metaphyseal component. We then introduced the trial stem with the metaphysis, the epi-1 right set on the 0 setting and placed in 10 degrees of retroversion and impacted that position.  We left the trial  implant in place to help protect the bone.  We retracted the humerus posteriorly and then did a 360-degree glenoid labrum removal and capsular release and removal.  We protected the axillary nerve.  Once we had good visualization of the glenoid with our deep retractors, we removed the remaining cartilage off the glenoid surface with a Cobb elevator.  There was not a whole lot left, found our center point with a guidepin, drilled that.  We then reamed for the metaglene and then drilled our central peg hole.   We impacted the metaglene in position, had good bony support for it.  We were able to put a 30 screw inferiorly and the 36 superiorly, both locked and then 18 nonlocked posteriorly, we could only get three screws, but good support and very secure.  We then placed a 38 standard glenosphere in position, impacted that and screwed in home.  We then trialed with a 38+ 3 initially and then 38+ 6 poly, felt like we had good stability.  We then removed the trial components on the humeral side, did the final irrigation of the shoulder and inspection and then used impaction grafting technique with available bone graft from the humeral head and impacted the HA-coated 10 stem with the epi-1 right metaphysis into position with 10 degrees of retroversion and again bone grafted the medial portion of it, impacting into place.  We had excellent security and stability.  We then trialed a 38+ 6 and felt like we could probably get 38+ 9 for little bit better stability, so we selected the real 38+ 9 poly, impacted that in position, reduced the shoulder, had no gapping with inferior pole, external rotation or extension.  We had a nice solid, very secure shoulder.  We irrigated thoroughly, resected the remaining subscap remnant and then closed the deltopectoral interval after more irrigation with 0 Vicryl suture followed by 2-0 Vicryl for subcutaneous closure and 4-0 Monocryl for skin.  Steri-Strips applied followed by a sterile dressing.  The patient tolerated the surgery well.     Doran Heater. Veverly Fells, M.D.     SRN/MEDQ  D:  05/28/2016  T:  05/29/2016  Job:  [id number]

## 2016-05-29 NOTE — Evaluation (Signed)
Occupational Therapy Evaluation Patient Details Name: Victoria Mccarthy MRN: [id number] DOB: Dec 26, 1945 Today's Date: 05/29/2016    History of Present Illness s/p RIGHT REVERSE SHOULDER ARTHROPLASTY. PMH includes bipolar, neuropathy, MVP (mitral valve prolapse), rotator cuff repairs 2x, back surgery in Dec 2016.   Clinical Impression   Pt admitted with the above diagnoses and presents with below problem list. Pt will benefit from continued acute OT to address the below listed deficits and maximize independence with basic ADLs. PTA pt was independent with ADLs. Pt is currently min A with most ADLs, supervision for mobility.       Follow Up Recommendations  Supervision/Assistance - 24 hour    Equipment Recommendations  None recommended by OT    Recommendations for Other Services       Precautions / Restrictions Precautions Precautions: Shoulder Type of Shoulder Precautions: active protocol Shoulder Interventions: Shoulder sling/immobilizer;For comfort (for sleep) Precaution Booklet Issued: Yes (comment) Required Braces or Orthoses: Sling Restrictions Weight Bearing Restrictions: Yes RUE Weight Bearing: Non weight bearing      Mobility Bed Mobility Overal bed mobility: Needs Assistance Bed Mobility: Supine to Sit;Sit to Supine     Supine to sit: Min assist;HOB elevated Sit to supine: Min assist   General bed mobility comments: bed rail used. pt plans to sleep in recliner at home.  Transfers Overall transfer level: Needs assistance Equipment used: None Transfers: Sit to/from Stand Sit to Stand: Supervision              Balance Overall balance assessment: Needs assistance         Standing balance support: No upper extremity supported Standing balance-Leahy Scale: Good                              ADL Overall ADL's : Needs assistance/impaired Eating/Feeding: Set up;Sitting   Grooming: Minimal assistance;Sitting;Standing   Upper Body  Bathing: Moderate assistance;Sitting   Lower Body Bathing: Minimal assistance;Sit to/from stand   Upper Body Dressing : Minimal assistance;Sitting   Lower Body Dressing: Minimal assistance;Sit to/from stand   Toilet Transfer: Min guard;Ambulation   Toileting- Clothing Manipulation and Hygiene: Min guard;Sit to/from stand   Tub/ Shower Transfer: Min guard   Functional mobility during ADLs: Min guard General ADL Comments: Pt completed in-room mobility, toilet transfer as detailed above. ADLs and exercises reviewed.  Handouts given.      Vision     Perception     Praxis      Pertinent Vitals/Pain Pain Assessment: 0-10 Pain Score: 5  Pain Location: right shoulder Pain Descriptors / Indicators: Aching;Sore Pain Intervention(s): Limited activity within patient's tolerance;Monitored during session;Repositioned;Patient requesting pain meds-RN notified;Ice applied     Hand Dominance Right   Extremity/Trunk Assessment Upper Extremity Assessment Upper Extremity Assessment: RUE deficits/detail RUE Deficits / Details: s/p RIGHT REVERSE SHOULDER ARTHROPLASTY RUE: Unable to fully assess due to pain   Lower Extremity Assessment Lower Extremity Assessment: Overall WFL for tasks assessed       Communication Communication Communication: No difficulties   Cognition Arousal/Alertness: Awake/alert Behavior During Therapy: WFL for tasks assessed/performed Overall Cognitive Status: Within Functional Limits for tasks assessed                     General Comments       Exercises Exercises: Shoulder;Other exercises     Shoulder Instructions Shoulder Instructions ROM for elbow, wrist and digits of operated UE: Supervision/safety Sling wearing schedule (on  at all times/off for ADL's): Modified independent Proper positioning of operated UE when showering: Modified independent Positioning of UE while sleeping: Leon expects to be  discharged to:: Private residence Living Arrangements: Parent (mother) Available Help at Discharge: Family;Available 24 hours/day Type of Home: House Home Access: Stairs to enter [employer] of Steps: 2 Entrance Stairs-Rails: Right Home Layout: One level     Bathroom Shower/Tub: Tub/shower unit Shower/tub characteristics: Door Biochemist, clinical: Standard     Home Equipment: Clinical cytogeneticist - 2 wheels;Adaptive equipment Adaptive Equipment: Reacher;Sock aid;Long-handled sponge        Prior Functioning/Environment Level of Independence: Independent             OT Diagnosis: Acute pain   OT Problem List: Impaired balance (sitting and/or standing);Decreased knowledge of use of DME or AE;Decreased knowledge of precautions;Pain;Impaired UE functional use   OT Treatment/Interventions: Self-care/ADL training;Therapeutic exercise;DME and/or AE instruction;Therapeutic activities;Patient/family education;Balance training    OT Goals(Current goals can be found in the care plan section) Acute Rehab OT Goals Patient Stated Goal: not stated OT Goal Formulation: With patient Time For Goal Achievement: 06/05/16 Potential to Achieve Goals: Good ADL Goals Pt Will Perform Upper Body Bathing: with modified independence;sitting Pt Will Perform Lower Body Bathing: with supervision;sit to/from stand Pt Will Perform Upper Body Dressing: with modified independence;sitting Pt Will Perform Lower Body Dressing: with supervision;sit to/from stand Pt Will Transfer to Toilet: with modified independence;ambulating Pt Will Perform Toileting - Clothing Manipulation and hygiene: with modified independence;sitting/lateral leans;sit to/from stand Pt Will Perform Tub/Shower Transfer: with supervision;ambulating;shower seat Pt/caregiver will Perform Home Exercise Program: Right Upper extremity;With written HEP provided  OT Frequency: Min 3X/week   Barriers to D/C:            Co-evaluation               End of Session Equipment Utilized During Treatment: Gait belt;Other (comment) (sling) Nurse Communication: Patient requests pain meds  Activity Tolerance: Patient tolerated treatment well;Patient limited by pain Patient left: in bed;with call bell/phone within reach   Time: 1037-1110 OT Time Calculation (min): 33 min Charges:  OT General Charges $OT Visit: 1 Procedure OT Evaluation $OT Eval Low Complexity: 1 Procedure OT Treatments $Self Care/Home Management : 8-22 mins G-Codes:    Hortencia Pilar 05/31/2016, 1:20 PM

## 2016-05-29 NOTE — Discharge Summary (Signed)
Physician Discharge Summary   Patient ID: [name] MRN: [id number] DOB/AGE: [birth year] 70 y.o.  Admit date: 05/28/2016 Discharge date: 05/29/2016  Admission Diagnoses:  Active Problems:   S/P shoulder replacement   Discharge Diagnoses:  Same   Surgeries: Procedure(s): RIGHT REVERSE SHOULDER ARTHROPLASTY on 05/28/2016   Consultants: OT  Discharged Condition: Stable  Hospital Course: Victoria Mccarthy is an 70 y.o. female who was admitted 05/28/2016 with a chief complaint of right shoulder pain, and found to have a diagnosis of right shoulder rotator cuff tear arthropathy.  They were brought to the operating room on 05/28/2016 and underwent the above named procedures.    The patient had an uncomplicated hospital course and was stable for discharge.  Recent vital signs:  Vitals:   05/29/16 0016 05/29/16 0423  BP: 129/60 (!) 121/51  Pulse: 87 81  Resp: 18 18  Temp: 97.5 F (36.4 C) 97.9 F (36.6 C)    Recent laboratory studies:  Results for orders placed or performed during the hospital encounter of 05/28/16  Hemoglobin and hematocrit, blood  Result Value Ref Range   Hemoglobin 11.2 (L) 12.0 - 15.0 g/dL   HCT 36.0 36.0 - [id number] %  Basic metabolic panel  Result Value Ref Range   Sodium 138 135 - 145 mmol/L   Potassium 4.2 3.5 - 5.1 mmol/L   Chloride 103 101 - 111 mmol/L   CO2 25 22 - 32 mmol/L   Glucose, Bld 148 (H) 65 - 99 mg/dL   BUN 11 6 - 20 mg/dL   Creatinine, Ser 0.79 0.44 - 1.00 mg/dL   Calcium 9.2 8.9 - 10.3 mg/dL   GFR calc non Af Amer >60 >60 mL/min   GFR calc Af Amer >60 >60 mL/min   Anion gap 10 5 - 15    Discharge Medications:     Medication List    TAKE these medications   ALPRAZolam 1 MG tablet Commonly known as:  XANAX Take 1 tablet (1 mg total) by mouth 4 (four) times daily.   atorvastatin 10 MG tablet Commonly known as:  LIPITOR Take 10 mg by mouth at bedtime.   cholestyramine 4 GM/DOSE powder Commonly known as:  QUESTRAN TAKE 1  SCOOPFUL (4 G TOTAL) BY MOUTH DAILY. DO NOT TAKE WITHIN 2 HOURS OF OTHER MEDICATIONS   FLUoxetine 40 MG capsule Commonly known as:  PROZAC Take 1 capsule (40 mg total) by mouth 2 (two) times daily.   methocarbamol 750 MG tablet Commonly known as:  ROBAXIN Take 750 mg by mouth daily as needed for muscle spasms. What changed:  Another medication with the same name was added. Make sure you understand how and when to take each.   methocarbamol 500 MG tablet Commonly known as:  ROBAXIN Take 1.5 tablets (750 mg total) by mouth every 6 (six) hours as needed. What changed:  You were already taking a medication with the same name, and this prescription was added. Make sure you understand how and when to take each.   multivitamin with minerals Tabs tablet Take 1 tablet by mouth every evening.   MYRBETRIQ 50 MG Tb24 tablet Generic drug:  mirabegron ER Take 50 mg by mouth daily.   nitroGLYCERIN 0.4 MG SL tablet Commonly known as:  NITROSTAT Place 0.4 mg under the tongue every 5 (five) minutes as needed for chest pain.   OLANZapine 15 MG tablet Commonly known as:  ZYPREXA Take 1 tablet (15 mg total) by mouth at bedtime.   oxyCODONE-acetaminophen 5-325  MG tablet Commonly known as:  PERCOCET/ROXICET Take 1 tablet by mouth every 8 (eight) hours as needed for pain. What changed:  Another medication with the same name was added. Make sure you understand how and when to take each.   oxyCODONE-acetaminophen 5-325 MG tablet Commonly known as:  ROXICET Take 1-2 tablets by mouth every 4 (four) hours as needed for severe pain. What changed:  You were already taking a medication with the same name, and this prescription was added. Make sure you understand how and when to take each.   rOPINIRole 3 MG tablet Commonly known as:  REQUIP TAKE 1 TABLET BY MOUTH 3 TIMES DAILY.       Diagnostic Studies: Dg Shoulder Right Port  Result Date: 05/28/2016 CLINICAL DATA:  Postop from right shoulder  replacement. EXAM: PORTABLE RIGHT SHOULDER - 1 VIEW COMPARISON:  None. FINDINGS: Right shoulder prosthesis seen in expected position. No evidence of fracture or dislocation. IMPRESSION: Expected postoperative appearance of right shoulder prosthesis. Electronically Signed   By: Earle Gell M.D.   On: 05/28/2016 10:43    Disposition: 01-Home or Self Care    Follow-up Information    [name],STEVEN R, MD. Call in 2 week(s).   Specialty:  Orthopedic Surgery Why:  [phone] Contact information: [address] [place] [address] [phone]            Signed: [name] 05/29/2016, 10:36 AM

## 2016-05-31 ENCOUNTER — Encounter (HOSPITAL_COMMUNITY): Payer: Self-pay | Admitting: Orthopedic Surgery

## 2016-05-31 NOTE — Anesthesia Postprocedure Evaluation (Signed)
Anesthesia Post Note  Patient: Victoria Mccarthy  Procedure(s) Performed: Procedure(s) (LRB): RIGHT REVERSE SHOULDER ARTHROPLASTY (Right)  Patient location during evaluation: PACU Anesthesia Type: General and Regional Level of consciousness: awake and alert Pain management: pain level controlled Vital Signs Assessment: post-procedure vital signs reviewed and stable Respiratory status: spontaneous breathing, nonlabored ventilation, respiratory function stable and patient connected to nasal cannula oxygen Cardiovascular status: blood pressure returned to baseline and stable Postop Assessment: no signs of nausea or vomiting Anesthetic complications: no     Last Vitals:  Vitals:   05/29/16 0016 05/29/16 0423  BP: 129/60 (!) 121/51  Pulse: 87 81  Resp: 18 18  Temp: 36.4 C 36.6 C    Last Pain:  Vitals:   05/29/16 1230  TempSrc:   PainSc: 3    Pain Goal:                 [name]

## 2016-06-02 ENCOUNTER — Ambulatory Visit (HOSPITAL_COMMUNITY): Payer: Medicare Other | Admitting: Psychiatry

## 2016-06-02 ENCOUNTER — Encounter (HOSPITAL_COMMUNITY): Payer: Self-pay

## 2016-06-04 DIAGNOSIS — Z23 Encounter for immunization: Secondary | ICD-10-CM | POA: Diagnosis not present

## 2016-06-09 DIAGNOSIS — Z96611 Presence of right artificial shoulder joint: Secondary | ICD-10-CM | POA: Diagnosis not present

## 2016-06-09 DIAGNOSIS — Z471 Aftercare following joint replacement surgery: Secondary | ICD-10-CM | POA: Diagnosis not present

## 2016-06-09 NOTE — Patient Instructions (Signed)
Your procedure is scheduled on: 06/14/2016  Report to [facility] at  88   AM.  Call this number if you have problems the morning of surgery: [phone]   Do not eat food or drink liquids :After Midnight.      Take these medicines the morning of surgery with A SIP OF WATER: xanax, prozac,robaxin, myrbetriq, oxycodone.   Do not wear jewelry, make-up or nail polish.  Do not wear lotions, powders, or perfumes. You may wear deodorant.  Do not shave 48 hours prior to surgery.  Do not bring valuables to the hospital.  Contacts, dentures or bridgework may not be worn into surgery.  Leave suitcase in the car. After surgery it may be brought to your room.  For patients admitted to the hospital, checkout time is 11:00 AM the day of discharge.   Patients discharged the day of surgery will not be allowed to drive home.  :     Please read over the following fact sheets that you were given: Coughing and Deep Breathing, Surgical Site Infection Prevention, Anesthesia Post-op Instructions and Care and Recovery After Surgery    Cataract A cataract is a clouding of the lens of the eye. When a lens becomes cloudy, vision is reduced based on the degree and nature of the clouding. Many cataracts reduce vision to some degree. Some cataracts make people more near-sighted as they develop. Other cataracts increase glare. Cataracts that are ignored and become worse can sometimes look white. The white color can be seen through the pupil. CAUSES   Aging. However, cataracts may occur at any age, even in newborns.   Certain drugs.   Trauma to the eye.   Certain diseases such as diabetes.   Specific eye diseases such as chronic inflammation inside the eye or a sudden attack of a rare form of glaucoma.   Inherited or acquired medical problems.  SYMPTOMS   Gradual, progressive drop in vision in the affected eye.   Severe, rapid visual loss. This most often happens when trauma is the cause.  DIAGNOSIS  To  detect a cataract, an eye doctor examines the lens. Cataracts are best diagnosed with an exam of the eyes with the pupils enlarged (dilated) by drops.  TREATMENT  For an early cataract, vision may improve by using different eyeglasses or stronger lighting. If that does not help your vision, surgery is the only effective treatment. A cataract needs to be surgically removed when vision loss interferes with your everyday activities, such as driving, reading, or watching TV. A cataract may also have to be removed if it prevents examination or treatment of another eye problem. Surgery removes the cloudy lens and usually replaces it with a substitute lens (intraocular lens, IOL).  At a time when both you and your doctor agree, the cataract will be surgically removed. If you have cataracts in both eyes, only one is usually removed at a time. This allows the operated eye to heal and be out of danger from any possible problems after surgery (such as infection or poor wound healing). In rare cases, a cataract may be doing damage to your eye. In these cases, your caregiver may advise surgical removal right away. The vast majority of people who have cataract surgery have better vision afterward. HOME CARE INSTRUCTIONS  If you are not planning surgery, you may be asked to do the following:  Use different eyeglasses.   Use stronger or brighter lighting.   Ask your eye doctor about reducing  your medicine dose or changing medicines if it is thought that a medicine caused your cataract. Changing medicines does not make the cataract go away on its own.   Become familiar with your surroundings. Poor vision can lead to injury. Avoid bumping into things on the affected side. You are at a higher risk for tripping or falling.   Exercise extreme care when driving or operating machinery.   Wear sunglasses if you are sensitive to bright light or experiencing problems with glare.  SEEK IMMEDIATE MEDICAL CARE IF:   You have  a worsening or sudden vision loss.   You notice redness, swelling, or increasing pain in the eye.   You have a fever.  Document Released: 09/06/2005 Document Revised: 08/26/2011 Document Reviewed: 04/30/2011 [facility] Patient Information 2012 [place].PATIENT INSTRUCTIONS POST-ANESTHESIA  IMMEDIATELY FOLLOWING SURGERY:  Do not drive or operate machinery for the first twenty four hours after surgery.  Do not make any important decisions for twenty four hours after surgery or while taking narcotic pain medications or sedatives.  If you develop intractable nausea and vomiting or a severe headache please notify your doctor immediately.  FOLLOW-UP:  Please make an appointment with your surgeon as instructed. You do not need to follow up with anesthesia unless specifically instructed to do so.  WOUND CARE INSTRUCTIONS (if applicable):  Keep a dry clean dressing on the anesthesia/puncture wound site if there is drainage.  Once the wound has quit draining you may leave it open to air.  Generally you should leave the bandage intact for twenty four hours unless there is drainage.  If the epidural site drains for more than 36-48 hours please call the anesthesia department.  QUESTIONS?:  Please feel free to call your physician or the hospital operator if you have any questions, and they will be happy to assist you.

## 2016-06-10 ENCOUNTER — Encounter (HOSPITAL_COMMUNITY)
Admission: RE | Admit: 2016-06-10 | Discharge: 2016-06-10 | Disposition: A | Discharge status: Home / Self Care | Payer: Medicare Other | Source: Ambulatory Visit | Attending: Ophthalmology | Admitting: Ophthalmology

## 2016-06-10 ENCOUNTER — Encounter (HOSPITAL_COMMUNITY): Payer: Self-pay

## 2016-06-14 ENCOUNTER — Ambulatory Visit (HOSPITAL_COMMUNITY): Payer: Medicare Other | Admitting: Anesthesiology

## 2016-06-14 ENCOUNTER — Encounter (HOSPITAL_COMMUNITY)
Admission: RE | Disposition: A | Discharge status: Home / Self Care | Payer: Self-pay | Source: Ambulatory Visit | Attending: Ophthalmology

## 2016-06-14 ENCOUNTER — Ambulatory Visit (HOSPITAL_COMMUNITY)
Admission: RE | Admit: 2016-06-14 | Discharge: 2016-06-14 | Disposition: A | Discharge status: Home / Self Care | Payer: Medicare Other | Source: Ambulatory Visit | Attending: Ophthalmology | Admitting: Ophthalmology

## 2016-06-14 DIAGNOSIS — H269 Unspecified cataract: Secondary | ICD-10-CM | POA: Insufficient documentation

## 2016-06-14 DIAGNOSIS — M1990 Unspecified osteoarthritis, unspecified site: Secondary | ICD-10-CM | POA: Diagnosis not present

## 2016-06-14 DIAGNOSIS — H2512 Age-related nuclear cataract, left eye: Secondary | ICD-10-CM | POA: Diagnosis not present

## 2016-06-14 DIAGNOSIS — H538 Other visual disturbances: Secondary | ICD-10-CM | POA: Diagnosis not present

## 2016-06-14 HISTORY — PX: CATARACT EXTRACTION W/PHACO: SHX586

## 2016-06-14 SURGERY — PHACOEMULSIFICATION, CATARACT, WITH IOL INSERTION
Anesthesia: Monitor Anesthesia Care | Site: Eye | Laterality: Left

## 2016-06-14 MED ORDER — MIDAZOLAM HCL 2 MG/2ML IJ SOLN
INTRAMUSCULAR | Status: DC | PRN
  Administered 2016-06-14 (×2): 1 mg via INTRAVENOUS

## 2016-06-14 MED ORDER — MIDAZOLAM HCL 2 MG/2ML IJ SOLN
INTRAMUSCULAR | Status: AC
  Filled 2016-06-14: qty 2

## 2016-06-14 MED ORDER — BSS IO SOLN
INTRAOCULAR | Status: DC | PRN
  Administered 2016-06-14: 15 mL via INTRAOCULAR

## 2016-06-14 MED ORDER — LIDOCAINE HCL (PF) 1 % IJ SOLN
INTRAMUSCULAR | Status: AC
  Filled 2016-06-14: qty 2

## 2016-06-14 MED ORDER — MIDAZOLAM HCL 2 MG/2ML IJ SOLN
1.0000 mg | INTRAMUSCULAR | Status: DC | PRN
  Administered 2016-06-14: 2 mg via INTRAVENOUS

## 2016-06-14 MED ORDER — CYCLOPENTOLATE-PHENYLEPHRINE 0.2-1 % OP SOLN
1.0000 [drp] | OPHTHALMIC | Status: AC
  Administered 2016-06-14 (×3): 1 [drp] via OPHTHALMIC
  Filled 2016-06-14: qty 2

## 2016-06-14 MED ORDER — TETRACAINE HCL 0.5 % OP SOLN
1.0000 [drp] | OPHTHALMIC | Status: AC
  Administered 2016-06-14 (×3): 1 [drp] via OPHTHALMIC
  Filled 2016-06-14: qty 4

## 2016-06-14 MED ORDER — LIDOCAINE HCL 3.5 % OP GEL
1.0000 "application " | Freq: Once | OPHTHALMIC | Status: DC
  Filled 2016-06-14: qty 1

## 2016-06-14 MED ORDER — LACTATED RINGERS IV SOLN
INTRAVENOUS | Status: DC
  Administered 2016-06-14: 07:00:00 via INTRAVENOUS

## 2016-06-14 MED ORDER — LIDOCAINE HCL 3.5 % OP GEL
OPHTHALMIC | Status: DC | PRN
  Administered 2016-06-14: 1 via OPHTHALMIC

## 2016-06-14 MED ORDER — FENTANYL CITRATE (PF) 100 MCG/2ML IJ SOLN
INTRAMUSCULAR | Status: AC
  Filled 2016-06-14: qty 2

## 2016-06-14 MED ORDER — PHENYLEPHRINE-KETOROLAC 1-0.3 % IO SOLN
INTRAOCULAR | Status: DC | PRN
  Administered 2016-06-14: 500 mL via OPHTHALMIC

## 2016-06-14 MED ORDER — TETRACAINE 0.5 % OP SOLN OPTIME - NO CHARGE
OPHTHALMIC | Status: DC | PRN
  Administered 2016-06-14: 1 [drp] via OPHTHALMIC

## 2016-06-14 MED ORDER — POVIDONE-IODINE 5 % OP SOLN
OPHTHALMIC | Status: DC | PRN
  Administered 2016-06-14: 1 via OPHTHALMIC

## 2016-06-14 MED ORDER — NA HYALUR & NA CHOND-NA HYALUR 0.55-0.5 ML IO KIT
PACK | INTRAOCULAR | Status: DC | PRN
  Administered 2016-06-14: 1 via OPHTHALMIC

## 2016-06-14 MED ORDER — BSS IO SOLN
INTRAOCULAR | Status: AC
  Filled 2016-06-14: qty 4

## 2016-06-14 MED ORDER — FENTANYL CITRATE (PF) 100 MCG/2ML IJ SOLN
25.0000 ug | INTRAMUSCULAR | Status: AC | PRN
  Administered 2016-06-14 (×2): 25 ug via INTRAVENOUS

## 2016-06-14 SURGICAL SUPPLY — 22 items
CAPSULAR TENSION RING-AMO (OPHTHALMIC RELATED) IMPLANT
CLOTH BEACON ORANGE TIMEOUT ST (SAFETY) ×1 IMPLANT
GLOVE BIOGEL PI IND STRL 7.0 (GLOVE) IMPLANT
GLOVE BIOGEL PI IND STRL 7.5 (GLOVE) IMPLANT
GLOVE BIOGEL PI INDICATOR 7.0 (GLOVE) ×1
GLOVE BIOGEL PI INDICATOR 7.5 (GLOVE) ×1
GLOVE EXAM NITRILE LRG STRL (GLOVE) IMPLANT
GLOVE EXAM NITRILE MD LF STRL (GLOVE) ×1 IMPLANT
GLOVE SKINSENSE NS SZ6.5 (GLOVE)
GLOVE SKINSENSE STRL SZ6.5 (GLOVE) IMPLANT
INST SET CATARACT ~~LOC~~ (KITS) ×2 IMPLANT
KIT VITRECTOMY (OPHTHALMIC RELATED) IMPLANT
LENS ALC ACRYL/TECN (Ophthalmic Related) ×2 IMPLANT
PAD ARMBOARD 7.5X6 YLW CONV (MISCELLANEOUS) ×1 IMPLANT
PROC W NO LENS (INTRAOCULAR LENS)
PROC W SPEC LENS (INTRAOCULAR LENS)
PROCESS W NO LENS (INTRAOCULAR LENS) IMPLANT
PROCESS W SPEC LENS (INTRAOCULAR LENS) IMPLANT
RETRACTOR IRIS SIGHTPATH (OPHTHALMIC RELATED) IMPLANT
RING MALYGIN (MISCELLANEOUS) IMPLANT
VISCOELASTIC ADDITIONAL (OPHTHALMIC RELATED) IMPLANT
WATER STERILE IRR 250ML POUR (IV SOLUTION) ×1 IMPLANT

## 2016-06-14 NOTE — Anesthesia Procedure Notes (Signed)
Procedure Name: MAC Date/Time: 06/14/2016 7:38 AM Performed by: Vista Deck Pre-anesthesia Checklist: Patient identified, Emergency Drugs available, Suction available, Timeout performed and Patient being monitored Patient Re-evaluated:Patient Re-evaluated prior to inductionOxygen Delivery Method: Nasal Cannula

## 2016-06-14 NOTE — H&P (Signed)
I have reviewed the pre printed H&P, the patient was re-examined, and I have identified no significant interval changes in the patient's medical condition.  There is no change in the plan of care since the history and physical of record. 

## 2016-06-14 NOTE — Brief Op Note (Signed)
06/14/2016  8:40 AM  PATIENT:  Victoria Mccarthy  70 y.o. female  PRE-OPERATIVE DIAGNOSIS:  nuclear cataract left eye  POST-OPERATIVE DIAGNOSIS:  nuclear cataract left eye  PROCEDURE:  Procedure(s): CATARACT EXTRACTION PHACO AND INTRAOCULAR LENS PLACEMENT; CDE:  3.92  SURGEON:  Surgeon(s): Williams Che, MD  ASSISTANTS:   Bonney Roussel, CST  ANESTHESIA STAFF: Anesthesiologist: Lerry Liner, MD CRNA: Vista Deck, CRNA  ANESTHESIA:   topical and MAC  REQUESTED LENS POWER: 19.5  LENS IMPLANT INFORMATION:  Alcon SN60WF s/n [id number]  Exp 06/20251  CUMULATIVE DISSIPATED ENERGY:3.92  INDICATIONS:see scanned office H&P  OP FINDINGS:mod dense NS  COMPLICATIONS:None  DICTATION #: none  PLAN OF CARE: as above  PATIENT DISPOSITION:  Short Stay

## 2016-06-14 NOTE — Anesthesia Preprocedure Evaluation (Signed)
Anesthesia Evaluation  Patient identified by MRN, date of birth, ID band Patient awake    Reviewed: Allergy & Precautions, NPO status , Patient's Chart, lab work & pertinent test results  History of Anesthesia Complications (+) PONV and history of anesthetic complications  Airway Mallampati: III  TM Distance: <3 FB Neck ROM: Full    Dental  (+) Edentulous Upper, Edentulous Lower   Pulmonary neg pulmonary ROS,    breath sounds clear to auscultation       Cardiovascular + Valvular Problems/Murmurs MVP  Rhythm:Regular     Neuro/Psych neg Seizures PSYCHIATRIC DISORDERS Anxiety Depression Bipolar Disorder  Neuromuscular disease    GI/Hepatic Neg liver ROS, GERD  Controlled,  Endo/Other  Morbid obesity  Renal/GU negative Renal ROS     Musculoskeletal  (+) Arthritis ,   Abdominal   Peds  Hematology negative hematology ROS (+)   Anesthesia Other Findings   Reproductive/Obstetrics                             Anesthesia Physical Anesthesia Plan  ASA: III  Anesthesia Plan: MAC   Post-op Pain Management:    Induction: Intravenous  Airway Management Planned: Nasal Cannula  Additional Equipment:   Intra-op Plan:   Post-operative Plan:   Informed Consent: I have reviewed the patients History and Physical, chart, labs and discussed the procedure including the risks, benefits and alternatives for the proposed anesthesia with the patient or authorized representative who has indicated his/her understanding and acceptance.     Plan Discussed with:   Anesthesia Plan Comments:         Anesthesia Quick Evaluation

## 2016-06-14 NOTE — Discharge Instructions (Signed)
Victoria Mccarthy 06/14/2016 Dr. Iona Hansen Post operative Instructions for Cataract Patients  These instructions are for Victoria Mccarthy and pertain to the operative eye.  1.  Resume your normal diet and previous oral medicines.  2. Your Follow-up appointment is at Dr. Iona Hansen' office in [place] on 06/16/16 @ 1:45pm  3. You may leave the hospital when your driver is present and your nurse releases you.  4. Begin Pred Forte (prednisolone acetate 1%), Acular LS (ketorolac tromethamine .4%) and Gatifloxacin 0.5% eye drops; 1 drop each 4 times daily to operative eye. Begin 3 hours after discharge from Short Stay Unit.  Moxifloxacin 0.5% may be substituted for Gatifloxacin using the same instructions.  33. Page Dr. Iona Hansen via beeper [phone] for significant pain in or around operative eye that is not relieved by Tylenol.  6. If you took Plavix before surgery, restart it at the usual dose on the evening of surgery.  7. Wear dark glasses as necessary for excessive light sensitivity.  8. Do no forcefully rub you your operative eye.  9. Keep your operative eye dry for 1 week. You may gently clean your eyelids with a damp washcloth.  10. You may resume normal occupational activities in one week and resume driving as tolerated after the first post operative visit.  11. It is normal to have blurred vision and a scratchy sensation following surgery.  Dr. Iona HansenJI:[id number]  PATIENT INSTRUCTIONS POST-ANESTHESIA  IMMEDIATELY FOLLOWING SURGERY:  Do not drive or operate machinery for the first twenty four hours after surgery.  Do not make any important decisions for twenty four hours after surgery or while taking narcotic pain medications or sedatives.  If you develop intractable nausea and vomiting or a severe headache please notify your doctor immediately.  FOLLOW-UP:  Please make an appointment with your surgeon as instructed. You do not need to follow up with anesthesia unless specifically instructed to do  so.  WOUND CARE INSTRUCTIONS (if applicable):  Keep a dry clean dressing on the anesthesia/puncture wound site if there is drainage.  Once the wound has quit draining you may leave it open to air.  Generally you should leave the bandage intact for twenty four hours unless there is drainage.  If the epidural site drains for more than 36-48 hours please call the anesthesia department.  QUESTIONS?:  Please feel free to call your physician or the hospital operator if you have any questions, and they will be happy to assist you.

## 2016-06-14 NOTE — Transfer of Care (Signed)
Immediate Anesthesia Transfer of Care Note  Patient: Victoria Mccarthy  Procedure(s) Performed: Procedure(s): CATARACT EXTRACTION PHACO AND INTRAOCULAR LENS PLACEMENT; CDE:  3.92 (Left)  Patient Location: Short Stay  Anesthesia Type:MAC  Level of Consciousness: awake and alert   Airway & Oxygen Therapy: Patient Spontanous Breathing  Post-op Assessment: Report given to RN  Post vital signs: Reviewed  Last Vitals:  Vitals:   06/14/16 0715 06/14/16 0720  BP: 122/64 120/61  Resp: (!) 22 14  Temp:      Last Pain:  Vitals:   06/14/16 0639  TempSrc: Oral      Patients Stated Pain Goal: 8 ([id number])  Complications: No apparent anesthesia complications

## 2016-06-14 NOTE — Anesthesia Postprocedure Evaluation (Signed)
Anesthesia Post Note  Patient: Victoria Mccarthy  Procedure(s) Performed: Procedure(s) (LRB): CATARACT EXTRACTION PHACO AND INTRAOCULAR LENS PLACEMENT; CDE:  3.92 (Left)  Anesthesia Post Evaluation  Anesthesia Post-op Note  Patient: Victoria Mccarthy  Procedure(s) Performed: Procedure(s) (LRB): CATARACT EXTRACTION PHACO AND INTRAOCULAR LENS PLACEMENT; CDE:  3.92 (Left)  Patient Location:  Short Stay  Anesthesia Type: MAC  Level of Consciousness: awake  Airway and Oxygen Therapy: Patient Spontanous Breathing  Post-op Pain: none  Post-op Assessment: Post-op Vital signs reviewed, Patient's Cardiovascular Status Stable, Respiratory Function Stable, Patent Airway, No signs of Nausea or vomiting and Pain level controlled  Post-op Vital Signs: Reviewed and stable  Complications: No apparent anesthesia complications Last Vitals:  Vitals:   06/14/16 0715 06/14/16 0720  BP: 122/64 120/61  Resp: (!) 22 14  Temp:      Last Pain:  Vitals:   06/14/16 0639  TempSrc: Oral                 Derico Mitton

## 2016-06-14 NOTE — Op Note (Signed)
49  8:40 AM  PATIENT:  Victoria Mccarthy  70 y.o. female  PRE-OPERATIVE DIAGNOSIS:  nuclear cataract left eye  POST-OPERATIVE DIAGNOSIS:  nuclear cataract left eye  PROCEDURE:  Procedure(s): CATARACT EXTRACTION PHACO AND INTRAOCULAR LENS PLACEMENT; CDE:  3.92  SURGEON:  Surgeon(s): Williams Che, MD  ASSISTANTS:   Bonney Roussel, CST  ANESTHESIA STAFF: Anesthesiologist: Lerry Liner, MD CRNA: Vista Deck, CRNA  ANESTHESIA:   topical and MAC  REQUESTED LENS POWER: 19.5  LENS IMPLANT INFORMATION:  Alcon SN60WF s/n [id number]  Exp 06/20251  CUMULATIVE DISSIPATED ENERGY:3.92  INDICATIONS:see scanned office H&P  OP FINDINGS:mod dense NS  PROCEDURE:  The patient was brought to the operating room in good condition.  The operative eye was prepped and draped in the usual fashion for intraocular surgery.  Lidocaine gel was dropped onto the eye.  A 2.4 mm 10 O'clock near clear corneal stepped incision and a 12 O'clock stab incision were created.  Viscoat was instilled into the anterior chamber.  The 5 mm anterior capsulorhexis was performed with a bent needle cystotome and Utrata forceps.  The lens was hydrodissected and hydrodelineated with a cannula and balanced salt solution and rotated with a Kuglen hook.  Phacoemulsification was perfomed in the divide and conquer technique.  The remaining cortex was removed with I&A and the capsular surfaces polished as necessary.  Provisc was placed into the capsular bag and the lens inserted with the Alcon inserter.  The viscoelastic was removed with I&A and the lens "rocked" into position.  The wounds were hydrated and te anterior chamber was refilled with balanced salt solution.  The wounds were checked for leakage and rehydrated as necessary.  The lid speculum and drapes were removed and the patient was transported to short stay in good condition.  PLAN OF CARE: as above

## 2016-06-17 ENCOUNTER — Encounter (HOSPITAL_COMMUNITY): Payer: Self-pay | Admitting: Ophthalmology

## 2016-06-24 ENCOUNTER — Ambulatory Visit (INDEPENDENT_AMBULATORY_CARE_PROVIDER_SITE_OTHER): Payer: Medicare Other | Admitting: Psychiatry

## 2016-06-24 ENCOUNTER — Encounter (HOSPITAL_COMMUNITY): Payer: Self-pay | Admitting: Psychiatry

## 2016-06-24 VITALS — BP 134/65 | HR 82 | Ht 65.0 in | Wt 200.6 lb

## 2016-06-24 DIAGNOSIS — F3130 Bipolar disorder, current episode depressed, mild or moderate severity, unspecified: Secondary | ICD-10-CM | POA: Diagnosis not present

## 2016-06-24 MED ORDER — ALPRAZOLAM 1 MG PO TABS: 1.0000 mg | ORAL_TABLET | Freq: Four times a day (QID) | ORAL | 2 refills | Status: DC

## 2016-06-24 MED ORDER — OLANZAPINE 15 MG PO TABS: 15.0000 mg | ORAL_TABLET | Freq: Every day | ORAL | 3 refills | Status: DC

## 2016-06-24 MED ORDER — FLUOXETINE HCL 40 MG PO CAPS: 40.0000 mg | ORAL_CAPSULE | Freq: Two times a day (BID) | ORAL | 2 refills | Status: DC

## 2016-06-24 NOTE — Progress Notes (Signed)
Patient ID: CONSTANT KRONINGER, female   DOB: 1946/03/31, 70 y.o.   MRN: [id number] Patient ID: MIKALIA HEM, female   DOB: 08-23-1946, 70 y.o.   MRN: [id number] Patient ID: BREALE WILKENS, female   DOB: 1945-12-12, 70 y.o.   MRN: [id number] Patient ID: BOYCE MURDOUGH, female   DOB: 10/21/1945, 70 y.o.   MRN: [id number] Patient ID: PATRISIA EMMANUEL, female   DOB: 05-22-1946, 70 y.o.   MRN: [id number] Patient ID: KEUNA JANNEY, female   DOB: 05/21/1946, 70 y.o.   MRN: [id number] Patient ID: LAYCEE NUMBERS, female   DOB: 24-Jun-1946, 70 y.o.   MRN: [id number] Patient ID: JOHNIKA BRIGNOLA, female   DOB: [birth year], 70 y.o.   MRN: [id number]  Psychiatric Assessment Adult  Patient Identification:  Victoria Mccarthy Date of Evaluation:  06/24/2016 Chief Complaint: "I am doing much better" History of Chief Complaint:   Chief Complaint  Patient presents with  . Depression  . Manic Behavior  . Anxiety  . Follow-up    Depression         Associated symptoms include decreased concentration and appetite change.  Past medical history includes anxiety.   Anxiety  Symptoms include decreased concentration and nervous/anxious behavior.     this patient is a 70 year old divorced white female who lives alone in [place]. She has one son, one daughter, 6 grandchildren 3 great-grandchildren. 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  The patient states that she's had problems with mental illness since her 70s. She was hospitalized at [facility] in [facility] twice in the 1980s. During these times she was depressed extremely anxious and having paranoid delusions that she had killed someone and that the police were after her. At one point she was hearing voices telling her to hurt her self. She finally started seeing Dr. Charmayne Sheer private practitioner in [place] who treated her for years. In the past she's been on Prozac, Symbyax, Xanax, Valium, BuSpar and lithium.  After her psychiatrist  retired, she began going to the [facility] but got tired of waiting for hours for recheck visit. She most recently she's been going to [place] [facility]. The psychiatrist there has cut down her medication. She used to be on Symbiax 6/50-2 a day and she was cut down to 4-25, once a day. Since this happened she become extremely anxious and depressed. At the beginning of this year however her insurance would not pay for the medicine so she's been off it entirely and she is getting even worse. The physician also cut her Xanax down from 1 mg 4 times a day to 0.5 mg twice a day and she is exceedingly anxious.  Currently the patient states that she is depressed all the time, very anxious and tearful, irritable and angry. She can't stand to be around people and stays by herself with her dogs. She's trying to spend some time sewing but it's very hard for her to concentrate. Her sleep is poor and her mind races. She is again having the delusion that she may have killed someone at the police may be coming to get her. She has nightmares about this. It's hard for her to sit still. She does not have any direct thoughts of hurting self or others. She constantly picks at her fingernails. She denies any current auditory or visual hallucinations  The patient returns after 2 months. She is overwhelmed. She had right shoulder surgery and cataract surgery  in the last month. Also she is become the main care taker for her 46 year old mother. The mother is very demanding and constantly wakes her up at night and the patient is getting exhausted. She is the only child and her own children don't have time to help much. I strongly encouraged her to call her mother's primary care physician to get a referral for home health aide so she can get some rest. It sounds as if the mother would need assisted care but she is reluctant. I'm very concerned that the patient may get extremely depressed or anxious of the situation doesn't  improve. She states the Xanax is still helping her anxiety and she's not seriously depressed but just tired and stressed Review of Systems  Constitutional: Positive for activity change, appetite change and unexpected weight change.  HENT: Negative.   Eyes: Negative.   Respiratory: Negative.   Cardiovascular: Negative.   Gastrointestinal: Positive for abdominal pain.  Endocrine: Negative.   Genitourinary: Negative.   Musculoskeletal: Positive for arthralgias, back pain and neck pain.  Skin: Negative.   Allergic/Immunologic: Negative.   Hematological: Negative.   Psychiatric/Behavioral: Positive for agitation, decreased concentration, depression, dysphoric mood, hallucinations and sleep disturbance. The patient is nervous/anxious.    Physical Exam not done  Depressive Symptoms: depressed mood, anhedonia, insomnia, psychomotor retardation, fatigue, feelings of worthlessness/guilt, difficulty concentrating, hopelessness, anxiety, panic attacks, weight loss, decreased appetite,  (Hypo) Manic Symptoms:   Elevated Mood:  no Irritable Mood:  Yes Grandiosity:  No Distractibility:  Yes Labiality of Mood:  Yes Delusions:  Yes Hallucinations:  No Impulsivity:  No Sexually Inappropriate Behavior:  No Financial Extravagance:  No Flight of Ideas:  No  Anxiety Symptoms: Excessive Worry:  Yes Panic Symptoms:  Yes Agoraphobia:  Yes Obsessive Compulsive: Yes  Symptoms: Picks at nails Specific Phobias:  No Social Anxiety:  Yes  Psychotic Symptoms:  Hallucinations: No None Delusions:  Yes Paranoia:  Yes   Ideas of Reference:  No  PTSD Symptoms: Ever had a traumatic exposure:  No Had a traumatic exposure in the last month:  No Re-experiencing: No None Hypervigilance:  No Hyperarousal: No None Avoidance: No None  Traumatic Brain Injury: Yes Sports Related  Past Psychiatric History: Diagnosis: Bipolar disorder   Hospitalizations: Twice in the 1980s   Outpatient Care:  At [facility] and families, mental [place], with private practitioner   Substance Abuse Care: none  Self-Mutilation:none  Suicidal Attempts: none  Violent Behaviors: none   Past Medical History:   Past Medical History:  Diagnosis Date  . Anemia   . Anxiety   . Arthritis    chronic back pain  . Bipolar disorder ([place])   . Cancer [facility]) Jan 2013   kidney cancer, left, s/p partial nephrectomy  . Depression   . Diarrhea    incontinent stools  . Diverticulitis    treated at least 5 times in past, hospitalized twice  . Heart murmur   . MVP (mitral valve prolapse)   . Neuropathy ([place])   . Nocturia   . Osteoarthritis   . Osteoporosis   . PONV (postoperative nausea and vomiting)   . Psychotic affective disorder ([place])    "psychotic delusions" "I cut myself"   . Restless leg syndrome   . Rheumatic fever   . Urinary frequency    History of Loss of Consciousness:  No Seizure History:  No Cardiac History:  No Allergies:   Allergies  Allergen Reactions  . Shellfish Allergy Anaphylaxis  . Neurontin [Gabapentin]  Other (See Comments)    Unable to talk when takes medication  . Vicodin [Hydrocodone-Acetaminophen] Itching  . Codeine Itching     itching  . Haldol [Haloperidol Lactate] Other (See Comments)    Muscle spasms  . Hydromorphone Hcl Nausea And Vomiting  . Latex Rash  . Propoxyphene N-Acetaminophen Nausea And Vomiting   Current Medications:  Current Outpatient Prescriptions  Medication Sig Dispense Refill  . ALPRAZolam (XANAX) 1 MG tablet Take 1 tablet (1 mg total) by mouth 4 (four) times daily. 120 tablet 2  . atorvastatin (LIPITOR) 10 MG tablet Take 10 mg by mouth at bedtime.  1  . cholestyramine (QUESTRAN) 4 GM/DOSE powder TAKE 1 SCOOPFUL (4 G TOTAL) BY MOUTH DAILY. DO NOT TAKE WITHIN 2 HOURS OF OTHER MEDICATIONS 378 g 5  . FLUoxetine (PROZAC) 40 MG capsule Take 1 capsule (40 mg total) by mouth 2 (two) times daily. 60 capsule 2  . methocarbamol (ROBAXIN) 500 MG tablet  Take 1.5 tablets (750 mg total) by mouth every 6 (six) hours as needed. 60 tablet 1  . mirabegron ER (MYRBETRIQ) 50 MG TB24 tablet Take 50 mg by mouth daily.    . Multiple Vitamin (MULTIVITAMIN WITH MINERALS) TABS Take 1 tablet by mouth every evening.    . nitroGLYCERIN (NITROSTAT) 0.4 MG SL tablet Place 0.4 mg under the tongue every 5 (five) minutes as needed for chest pain.    . NON FORMULARY Vitamin for Hair, Nails and Skin    . OLANZapine (ZYPREXA) 15 MG tablet Take 1 tablet (15 mg total) by mouth at bedtime. 90 tablet 3  . oxyCODONE-acetaminophen (PERCOCET/ROXICET) 5-325 MG tablet Take 1 tablet by mouth every 8 (eight) hours as needed for pain.  0  . rOPINIRole (REQUIP) 3 MG tablet TAKE 1 TABLET BY MOUTH 3 TIMES DAILY.  11  . methocarbamol (ROBAXIN) 750 MG tablet Take 750 mg by mouth daily as needed for muscle spasms.  0  . oxyCODONE-acetaminophen (ROXICET) 5-325 MG tablet Take 1-2 tablets by mouth every 4 (four) hours as needed for severe pain. (Patient not taking: Reported on 06/24/2016) 60 tablet 0   No current facility-administered medications for this visit.     Previous Psychotropic Medications:  Medication Dose   Prozac, Symbyax, Xanax, Valium, BuSpar, lithium                        Substance Abuse History in the last 12 months: Substance Age of 1st Use Last Use Amount Specific Type  Nicotine      Alcohol      Cannabis      Opiates      Cocaine      Methamphetamines      LSD      Ecstasy      Benzodiazepines      Caffeine      Inhalants      Others:                          Medical Consequences of Substance Abuse: none  Legal Consequences of Substance Abuse: none  Family Consequences of Substance Abuse: none  Blackouts:  No DT's:  No Withdrawal Symptoms:  No None  Social History: Current Place of Residence: [place] of Birth: [place] Family Members: 2 children, several grandchildren Marital Status:   Divorced Children:   Sons: 1  Daughters: 1 Relationships: No friends Education:  Dentist Problems/Performance:  Religious Beliefs/Practices: none History of Abuse: none Occupational Experiences; Artist, Catering manager History:  None. Legal History: none Hobbies/Interests: TV, sewing  Family History:   Family History  Problem Relation Age of Onset  . Bipolar disorder Daughter   . Colon cancer Neg Hx     Mental Status Examination/Evaluation: Objective:  Appearance: Neat and Well Groomed  Engineer, water::  Fair  Speech:  Clear and coherent   Volume:  Normal  Mood: Anxious   Affect Tired   Thought Process:  Goal Directed  Orientation:  Full (Time, Place, and Person)  Thought Content:  normal  Suicidal Thoughts:  No  Homicidal Thoughts:  No  Judgement:  Fair  Insight:  Fair  Psychomotor Activity:  Normal   Akathisia:  No  Handed:  Right  AIMS (if indicated):    Assets:  Communication Skills Desire for Improvement Resilience Social Support Talents/Skills    Laboratory/X-Ray Psychological Evaluation(s)   Labs reviewed in the chart are unremarkable      Assessment:  Axis I: Bipolar, mixed  AXIS I Bipolar, mixed  AXIS II Deferred  AXIS III Past Medical History:  Diagnosis Date  . Anemia   . Anxiety   . Arthritis    chronic back pain  . Bipolar disorder ([place])   . Cancer [facility]) Jan 2013   kidney cancer, left, s/p partial nephrectomy  . Depression   . Diarrhea    incontinent stools  . Diverticulitis    treated at least 5 times in past, hospitalized twice  . Heart murmur   . MVP (mitral valve prolapse)   . Neuropathy ([place])   . Nocturia   . Osteoarthritis   . Osteoporosis   . PONV (postoperative nausea and vomiting)   . Psychotic affective disorder ([place])    "psychotic delusions" "I cut myself"   . Restless leg syndrome   . Rheumatic fever   . Urinary frequency      AXIS IV other psychosocial or environmental problems  AXIS V  41-50 serious symptoms   Treatment Plan/Recommendations:  Plan of Care: Medication management   Laboratory:  As noted in history of present illness   Psychotherapy: She is seeing Tera Mater here     She will will continue Prozac 80 mg every morning for depression and continue Zyprexa  15 mg daily at bedtime for her bipolar disorder and delusions. She will continue Xanax 1 mg 4 times a day for anxiety   Routine PRN Medications:  No  Consultations: I have told her she will need to call her mother's PCP as soon as possible to arrange for home health assistance for her mother   Safety Concerns:  She denies thoughts of hurting self or others   Other:  She'll return in 2 months     Adama Ferber, Neoma Laming, MD 10/5/20172:16 PM

## 2016-07-07 DIAGNOSIS — Z471 Aftercare following joint replacement surgery: Secondary | ICD-10-CM | POA: Diagnosis not present

## 2016-07-07 DIAGNOSIS — Z96611 Presence of right artificial shoulder joint: Secondary | ICD-10-CM | POA: Diagnosis not present

## 2016-08-04 DIAGNOSIS — Z471 Aftercare following joint replacement surgery: Secondary | ICD-10-CM | POA: Diagnosis not present

## 2016-08-04 DIAGNOSIS — Z96611 Presence of right artificial shoulder joint: Secondary | ICD-10-CM | POA: Diagnosis not present

## 2016-08-24 ENCOUNTER — Ambulatory Visit (INDEPENDENT_AMBULATORY_CARE_PROVIDER_SITE_OTHER): Payer: Medicare Other | Admitting: Psychiatry

## 2016-08-24 ENCOUNTER — Encounter (HOSPITAL_COMMUNITY): Payer: Self-pay | Admitting: Psychiatry

## 2016-08-24 VITALS — BP 136/78 | HR 89 | Ht 65.0 in | Wt 193.6 lb

## 2016-08-24 DIAGNOSIS — Z91013 Allergy to seafood: Secondary | ICD-10-CM | POA: Diagnosis not present

## 2016-08-24 DIAGNOSIS — Z888 Allergy status to other drugs, medicaments and biological substances status: Secondary | ICD-10-CM | POA: Diagnosis not present

## 2016-08-24 DIAGNOSIS — F3130 Bipolar disorder, current episode depressed, mild or moderate severity, unspecified: Secondary | ICD-10-CM

## 2016-08-24 DIAGNOSIS — Z818 Family history of other mental and behavioral disorders: Secondary | ICD-10-CM | POA: Diagnosis not present

## 2016-08-24 DIAGNOSIS — Z79899 Other long term (current) drug therapy: Secondary | ICD-10-CM

## 2016-08-24 DIAGNOSIS — Z91040 Latex allergy status: Secondary | ICD-10-CM

## 2016-08-24 MED ORDER — ALPRAZOLAM 1 MG PO TABS: 1.0000 mg | ORAL_TABLET | Freq: Four times a day (QID) | ORAL | 2 refills | Status: DC

## 2016-08-24 MED ORDER — OLANZAPINE 15 MG PO TABS: 15.0000 mg | ORAL_TABLET | Freq: Every day | ORAL | 3 refills | Status: DC

## 2016-08-24 MED ORDER — FLUOXETINE HCL 40 MG PO CAPS: 40.0000 mg | ORAL_CAPSULE | Freq: Two times a day (BID) | ORAL | 2 refills | Status: DC

## 2016-08-24 NOTE — Progress Notes (Signed)
Patient ID: Victoria Mccarthy, female   DOB: 01/12/1946, 70 y.o.   MRN: [id number] Patient ID: Victoria Mccarthy, female   DOB: 07/26/1946, 70 y.o.   MRN: [id number] Patient ID: Victoria Mccarthy, female   DOB: Jun 23, 1946, 70 y.o.   MRN: [id number] Patient ID: Victoria Mccarthy, female   DOB: [birth year], 70 y.o.   MRN: [id number] Patient ID: Victoria Mccarthy, female   DOB: [birth year], 70 y.o.   MRN: [id number] Patient ID: Victoria Mccarthy, female   DOB: 12-Nov-1945, 70 y.o.   MRN: [id number] Patient ID: Victoria Mccarthy, female   DOB: 12/04/1945, 70 y.o.   MRN: [id number] Patient ID: Victoria Mccarthy, female   DOB: [birth year], 70 y.o.   MRN: [id number]  Psychiatric Assessment Adult  Patient Identification:  Victoria Mccarthy Date of Evaluation:  08/24/2016 Chief Complaint: "I am doing much better" History of Chief Complaint:   Chief Complaint  Patient presents with  . Manic Behavior  . Depression  . Follow-up    Depression         Associated symptoms include decreased concentration and appetite change.  Past medical history includes anxiety.   Anxiety  Symptoms include decreased concentration and nervous/anxious behavior.     this patient is a 70 year old divorced white female who lives alone in [place]. She has one son, one daughter, 6 grandchildren 3 great-grandchildren. 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  The patient states that she's had problems with mental illness since her 23s. She was hospitalized at [facility] twice in the 1980s. During these times she was depressed extremely anxious and having paranoid delusions that she had killed someone and that the police were after her. At one point she was hearing voices telling her to hurt her self. She finally started seeing Dr. Charmayne Sheer private practitioner in [place] who treated her for years. In the past she's been on Prozac, Symbyax, Xanax, Valium, BuSpar and lithium.  After her psychiatrist retired, she  began going to the [facility] but got tired of waiting for hours for recheck visit. She most recently she's been going to [place] [facility] families. The psychiatrist there has cut down her medication. She used to be on Symbiax 6/50-2 a day and she was cut down to 4-25, once a day. Since this happened she become extremely anxious and depressed. At the beginning of this year however her insurance would not pay for the medicine so she's been off it entirely and she is getting even worse. The physician also cut her Xanax down from 1 mg 4 times a day to 0.5 mg twice a day and she is exceedingly anxious.  Currently the patient states that she is depressed all the time, very anxious and tearful, irritable and angry. She can't stand to be around people and stays by herself with her dogs. She's trying to spend some time sewing but it's very hard for her to concentrate. Her sleep is poor and her mind races. She is again having the delusion that she may have killed someone at the police may be coming to get her. She has nightmares about this. It's hard for her to sit still. She does not have any direct thoughts of hurting self or others. She constantly picks at her fingernails. She denies any current auditory or visual hallucinations  The patient returns after 3 months. She recently had to put her mother in a nursing home after her mother  took a bad fall at home and hit her head. She spending all day in evening at the nursing home and I explained that this is why the staff is there. She needs to go home and take care of herself and visit her mother on a scheduled basis. She is still very exhausted from trying to be there all the time. She promises that she will cut back on the visiting hours. Her mood has been stable but she does appear tired Review of Systems  Constitutional: Positive for activity change, appetite change and unexpected weight change.  HENT: Negative.   Eyes: Negative.   Respiratory: Negative.    Cardiovascular: Negative.   Gastrointestinal: Positive for abdominal pain.  Endocrine: Negative.   Genitourinary: Negative.   Musculoskeletal: Positive for arthralgias, back pain and neck pain.  Skin: Negative.   Allergic/Immunologic: Negative.   Hematological: Negative.   Psychiatric/Behavioral: Positive for agitation, decreased concentration, depression, dysphoric mood, hallucinations and sleep disturbance. The patient is nervous/anxious.    Physical Exam not done  Depressive Symptoms: depressed mood, anhedonia, insomnia, psychomotor retardation, fatigue, feelings of worthlessness/guilt, difficulty concentrating, hopelessness, anxiety, panic attacks, weight loss, decreased appetite,  (Hypo) Manic Symptoms:   Elevated Mood:  no Irritable Mood:  Yes Grandiosity:  No Distractibility:  Yes Labiality of Mood:  Yes Delusions:  Yes Hallucinations:  No Impulsivity:  No Sexually Inappropriate Behavior:  No Financial Extravagance:  No Flight of Ideas:  No  Anxiety Symptoms: Excessive Worry:  Yes Panic Symptoms:  Yes Agoraphobia:  Yes Obsessive Compulsive: Yes  Symptoms: Picks at nails Specific Phobias:  No Social Anxiety:  Yes  Psychotic Symptoms:  Hallucinations: No None Delusions:  Yes Paranoia:  Yes   Ideas of Reference:  No  PTSD Symptoms: Ever had a traumatic exposure:  No Had a traumatic exposure in the last month:  No Re-experiencing: No None Hypervigilance:  No Hyperarousal: No None Avoidance: No None  Traumatic Brain Injury: Yes Sports Related  Past Psychiatric History: Diagnosis: Bipolar disorder   Hospitalizations: Twice in the 1980s   Outpatient Care: At [facility] families, mental [place], with private practitioner   Substance Abuse Care: none  Self-Mutilation:none  Suicidal Attempts: none  Violent Behaviors: none   Past Medical History:   Past Medical History:  Diagnosis Date  . Anemia   . Anxiety   . Arthritis    chronic back  pain  . Bipolar disorder ([place])   . Cancer [facility]) Jan 2013   kidney cancer, left, s/p partial nephrectomy  . Depression   . Diarrhea    incontinent stools  . Diverticulitis    treated at least 5 times in past, hospitalized twice  . Heart murmur   . MVP (mitral valve prolapse)   . Neuropathy ([place])   . Nocturia   . Osteoarthritis   . Osteoporosis   . PONV (postoperative nausea and vomiting)   . Psychotic affective disorder ([place])    "psychotic delusions" "I cut myself"   . Restless leg syndrome   . Rheumatic fever   . Urinary frequency    History of Loss of Consciousness:  No Seizure History:  No Cardiac History:  No Allergies:   Allergies  Allergen Reactions  . Shellfish Allergy Anaphylaxis  . Neurontin [Gabapentin] Other (See Comments)    Unable to talk when takes medication  . Vicodin [Hydrocodone-Acetaminophen] Itching  . Codeine Itching     itching  . Haldol [Haloperidol Lactate] Other (See Comments)    Muscle spasms  .  Hydromorphone Hcl Nausea And Vomiting  . Latex Rash  . Propoxyphene N-Acetaminophen Nausea And Vomiting   Current Medications:  Current Outpatient Prescriptions  Medication Sig Dispense Refill  . ALPRAZolam (XANAX) 1 MG tablet Take 1 tablet (1 mg total) by mouth 4 (four) times daily. 120 tablet 2  . atorvastatin (LIPITOR) 10 MG tablet Take 10 mg by mouth at bedtime.  1  . cholestyramine (QUESTRAN) 4 GM/DOSE powder TAKE 1 SCOOPFUL (4 G TOTAL) BY MOUTH DAILY. DO NOT TAKE WITHIN 2 HOURS OF OTHER MEDICATIONS 378 g 5  . FLUoxetine (PROZAC) 40 MG capsule Take 1 capsule (40 mg total) by mouth 2 (two) times daily. 60 capsule 2  . methocarbamol (ROBAXIN) 500 MG tablet Take 1.5 tablets (750 mg total) by mouth every 6 (six) hours as needed. 60 tablet 1  . methocarbamol (ROBAXIN) 750 MG tablet Take 750 mg by mouth daily as needed for muscle spasms.  0  . mirabegron ER (MYRBETRIQ) 50 MG TB24 tablet Take 50 mg by mouth daily.    . Multiple Vitamin (MULTIVITAMIN  WITH MINERALS) TABS Take 1 tablet by mouth every evening.    . nitroGLYCERIN (NITROSTAT) 0.4 MG SL tablet Place 0.4 mg under the tongue every 5 (five) minutes as needed for chest pain.    . NON FORMULARY Vitamin for Hair, Nails and Skin    . OLANZapine (ZYPREXA) 15 MG tablet Take 1 tablet (15 mg total) by mouth at bedtime. 90 tablet 3  . rOPINIRole (REQUIP) 3 MG tablet TAKE 1 TABLET BY MOUTH 3 TIMES DAILY.  11   No current facility-administered medications for this visit.     Previous Psychotropic Medications:  Medication Dose   Prozac, Symbyax, Xanax, Valium, BuSpar, lithium                        Substance Abuse History in the last 12 months: Substance Age of 1st Use Last Use Amount Specific Type  Nicotine      Alcohol      Cannabis      Opiates      Cocaine      Methamphetamines      LSD      Ecstasy      Benzodiazepines      Caffeine      Inhalants      Others:                          Medical Consequences of Substance Abuse: none  Legal Consequences of Substance Abuse: none  Family Consequences of Substance Abuse: none  Blackouts:  No DT's:  No Withdrawal Symptoms:  No None  Social History: Current Place of Residence: [place] of Birth: [place] Family Members: 2 children, several grandchildren Marital Status:  Divorced Children:   Sons: 1  Daughters: 1 Relationships: No friends Education:  Dentist Problems/Performance:  Religious Beliefs/Practices: none History of Abuse: none Pensions consultant; Artist, Catering manager History:  None. Legal History: none Hobbies/Interests: TV, sewing  Family History:   Family History  Problem Relation Age of Onset  . Bipolar disorder Daughter   . Colon cancer Neg Hx     Mental Status Examination/Evaluation: Objective:  Appearance: Neat and Well Groomed  Engineer, water::  Fair  Speech:  Clear and coherent   Volume:  Normal  Mood: Anxious    Affect Tired   Thought Process:  Goal Directed  Orientation:  Full (Time, Place, and Person)  Thought Content:  normal  Suicidal Thoughts:  No  Homicidal Thoughts:  No  Judgement:  Fair  Insight:  Fair  Psychomotor Activity:  Normal   Akathisia:  No  Handed:  Right  AIMS (if indicated):    Assets:  Communication Skills Desire for Improvement Resilience Social Support Talents/Skills    Laboratory/X-Ray Psychological Evaluation(s)   Labs reviewed in the chart are unremarkable      Assessment:  Axis I: Bipolar, mixed  AXIS I Bipolar, mixed  AXIS II Deferred  AXIS III Past Medical History:  Diagnosis Date  . Anemia   . Anxiety   . Arthritis    chronic back pain  . Bipolar disorder ([place])   . Cancer [facility]) Jan 2013   kidney cancer, left, s/p partial nephrectomy  . Depression   . Diarrhea    incontinent stools  . Diverticulitis    treated at least 5 times in past, hospitalized twice  . Heart murmur   . MVP (mitral valve prolapse)   . Neuropathy ([place])   . Nocturia   . Osteoarthritis   . Osteoporosis   . PONV (postoperative nausea and vomiting)   . Psychotic affective disorder ([place])    "psychotic delusions" "I cut myself"   . Restless leg syndrome   . Rheumatic fever   . Urinary frequency      AXIS IV other psychosocial or environmental problems  AXIS V 41-50 serious symptoms   Treatment Plan/Recommendations:  Plan of Care: Medication management   Laboratory:  As noted in history of present illness   Psychotherapy: She is seeing Tera Mater here     She will will continue Prozac 80 mg every morning for depression and continue Zyprexa  15 mg daily at bedtime for her bipolar disorder and delusions. She will continue Xanax 1 mg 4 times a day for anxiety   Routine PRN Medications:  No  Consultations:  Safety Concerns:  She denies thoughts of hurting self or others   Other:  She'll return in 3 months     Levonne Spiller, MD 12/5/20173:08 PM

## 2016-09-27 ENCOUNTER — Ambulatory Visit (INDEPENDENT_AMBULATORY_CARE_PROVIDER_SITE_OTHER): Payer: Medicare Other | Admitting: Psychology

## 2016-09-27 DIAGNOSIS — F3130 Bipolar disorder, current episode depressed, mild or moderate severity, unspecified: Secondary | ICD-10-CM | POA: Diagnosis not present

## 2016-09-27 NOTE — Progress Notes (Signed)
Patient:  Victoria Mccarthy   DOB: 1946-07-31  MR Number: [id number]  Location: [facility]- [address] [place] [address] Dept: [phone]  Start:  4 PM End:  5:10 PM  Provider/Observer:     Edgardo Roys PSYD  Chief Complaint:      Chief Complaint  Patient presents with  . Depression    Reason For Service:    The patient is a 71 year old divorced Caucasian female. She has been working with Dr. Harrington Challenger for psychiatric care and was referred for psychotherapy. The patient reports that she has a long history of psychiatric illness dating to her 30s. She was hospitalized inpatient on several occasions in the 1980s and was treated by Dr. Dora Sims for some time in [place]. She been seeing another psychiatrist when  Dr. Tamala Julian retired but was not getting good response to the psychiatric interventions that she was being provided. The patient reports that her symptoms deteriorated a great deal and salt other care and thus came to see our office. The patient has a history of psychotic behavior and suicidal ideation. More recently, she reports that she had been depressed all the time, very anxious, and tearful. She reports that is very difficult for her to be around others as well as most of her time isolated with her walks. Hobbies include sewing but has difficulty concentrating.  Interventions Strategy:  Cognitive/behavioral psychotherapeutic interventions  Participation Level:   Active  Participation Quality:  Appropriate      Behavioral Observation:  Well Groomed, Alert, and Appropriate.   Current Psychosocial Factors: The patient reports that her mother passed away this past 2023/01/14.  Her mother was in nursing home and dealing with medical issues.  Mother fell and struck head, leading in part to the mother dying.  THe patient reprots that she is overwhelmed by these events and fears she is not  going to be able to handle them.  The patient reports that she has had SI and had vague plan of taking medication to die due to fears that she will die like her mother and pt does not want that.  She came in on emergiency basis today.  Content of Session:   Reviewed current symptoms and work on therapeutic interventions around anxiety and depression and times of paranoid ideation.  Current Status:   The patient reports that she has had sudden loss (mother death) and has not been doing well.  She reports SI, Daugher brings her in today.  Have not seen pt for more than year.  Pt contracts for safety and meet with PT and Daughter to develop a plan of what to due if patient feels suicidal again and feels like she may act on it.  Pt and daughter agree to play and that daughter will keep close eye on patient.  Pt to call if things worsen or present at ED if they deteriorate.    Patient Progress:   Sudden loss of Mother  Target Goals:   Target goals include reducing intensity, severity, and duration of issues related to anxiety, depression, agitation, and avoiding a recurrence of her past history of paranoid ideation.  Last Reviewed:    09/28/2015  Goals Addressed Today:    Today we worked on Therapist, occupational and strategies.  Impression/Diagnosis:   The patient is a long history of psychiatric illnesses she was in her 87s. She has had times where she becomes psychotic and has had  a past diagnosis of bipolar disorder and when she gets manic she will become paranoid and have hallucinations. More recently, she had been managed with medications but attempts at medication changes recently resulted in a significant deterioration. She is now seeing Dr. Harrington Challenger for care with regard to these medications.  Diagnosis:    Axis I: Bipolar I disorder, most recent episode depressed ([place])   RODENBOUGH,JOHN R, PsyD 09/27/2016

## 2016-10-27 ENCOUNTER — Other Ambulatory Visit: Payer: Self-pay | Admitting: Gastroenterology

## 2016-11-19 ENCOUNTER — Encounter (HOSPITAL_COMMUNITY): Payer: Self-pay | Admitting: Psychiatry

## 2016-11-19 ENCOUNTER — Ambulatory Visit (INDEPENDENT_AMBULATORY_CARE_PROVIDER_SITE_OTHER): Payer: Medicare Other | Admitting: Psychiatry

## 2016-11-19 VITALS — BP 146/80 | HR 92 | Ht 65.0 in | Wt 185.4 lb

## 2016-11-19 DIAGNOSIS — Z79899 Other long term (current) drug therapy: Secondary | ICD-10-CM | POA: Diagnosis not present

## 2016-11-19 DIAGNOSIS — Z818 Family history of other mental and behavioral disorders: Secondary | ICD-10-CM

## 2016-11-19 DIAGNOSIS — Z91040 Latex allergy status: Secondary | ICD-10-CM

## 2016-11-19 DIAGNOSIS — Z888 Allergy status to other drugs, medicaments and biological substances status: Secondary | ICD-10-CM | POA: Diagnosis not present

## 2016-11-19 DIAGNOSIS — F3130 Bipolar disorder, current episode depressed, mild or moderate severity, unspecified: Secondary | ICD-10-CM

## 2016-11-19 DIAGNOSIS — Z91013 Allergy to seafood: Secondary | ICD-10-CM | POA: Diagnosis not present

## 2016-11-19 MED ORDER — ALPRAZOLAM 1 MG PO TABS: 1.0000 mg | ORAL_TABLET | Freq: Four times a day (QID) | ORAL | 2 refills | Status: DC

## 2016-11-19 MED ORDER — FLUOXETINE HCL 40 MG PO CAPS: 40.0000 mg | ORAL_CAPSULE | Freq: Two times a day (BID) | ORAL | 2 refills | Status: DC

## 2016-11-19 MED ORDER — OLANZAPINE 20 MG PO TABS: 20.0000 mg | ORAL_TABLET | Freq: Every day | ORAL | 2 refills | Status: DC

## 2016-11-19 NOTE — Progress Notes (Signed)
Patient ID: ROSHELL ACEITUNO, female   DOB: 03/07/1946, 71 y.o.   MRN: [id number] Patient ID: NDEA MOSKO, female   DOB: 1945-10-09, 71 y.o.   MRN: [id number] Patient ID: AARYN TRESSEL, female   DOB: [birth year], 71 y.o.   MRN: [id number] Patient ID: JAILEEN MALKIEWICZ, female   DOB: April 02, 1946, 71 y.o.   MRN: [id number] Patient ID: MADELENE RISBERG, female   DOB: 1946-08-25, 71 y.o.   MRN: [id number] Patient ID: ANAYANCI WINKELMANN, female   DOB: 05/21/1946, 71 y.o.   MRN: [id number] Patient ID: MYKAH HYSELL, female   DOB: June 13, 1946, 71 y.o.   MRN: [id number] Patient ID: VYOLA MANDOZA, female   DOB: July 18, 1946, 71 y.o.   MRN: [id number]  Psychiatric Assessment Adult  Patient Identification:  Ceretha Lenth Vaile Date of Evaluation:  11/19/2016 Chief Complaint: "I am doing much better" History of Chief Complaint:   Chief Complaint  Patient presents with  . Depression  . Manic Behavior  . Anxiety  . Follow-up    Depression         Associated symptoms include decreased concentration and appetite change.  Past medical history includes anxiety.   Anxiety  Symptoms include decreased concentration and nervous/anxious behavior.     this patient is a 71 year old divorced white female who lives alone in [place]. She has one son, one daughter, 6 grandchildren 3 great-grandchildren. 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  The patient states that she's had problems with mental illness since her 51s. She was hospitalized at [facility] in [facility] twice in the 1980s. During these times she was depressed extremely anxious and having paranoid delusions that she had killed someone and that the police were after her. At one point she was hearing voices telling her to hurt her self. She finally started seeing Dr. Charmayne Sheer private practitioner in [place] who treated her for years. In the past she's been on Prozac, Symbyax, Xanax, Valium, BuSpar and lithium.  After her psychiatrist  retired, she began going to the [facility] but got tired of waiting for hours for recheck visit. She most recently she's been going to [facility]. The psychiatrist there has cut down her medication. She used to be on Symbiax 6/50-2 a day and she was cut down to 4-25, once a day. Since this happened she become extremely anxious and depressed. At the beginning of this year however her insurance would not pay for the medicine so she's been off it entirely and she is getting even worse. The physician also cut her Xanax down from 1 mg 4 times a day to 0.5 mg twice a day and she is exceedingly anxious.  Currently the patient states that she is depressed all the time, very anxious and tearful, irritable and angry. She can't stand to be around people and stays by herself with her dogs. She's trying to spend some time sewing but it's very hard for her to concentrate. Her sleep is poor and her mind races. She is again having the delusion that she may have killed someone at the police may be coming to get her. She has nightmares about this. It's hard for her to sit still. She does not have any direct thoughts of hurting self or others. She constantly picks at her fingernails. She denies any current auditory or visual hallucinations  The patient returns after 3 months. She states that her mother died on 2022-10-03 in the  nursing home. She states that since then she has felt very depressed and anxious. She was an only child and is inherited the property in the farm and the houses. She has not moved anything into her name yet but sits in her mother's house and "talks to her picture." I explained that this would not be healthy for anybody and that she needs to spend some time grieving but also needs to go on with her own life. Her children are very busy. I found some grief support groups near her. She is already on a high dose of Xanax and Prozac but I will increase her Abilify to help with the sleep and  anxiety problems. She denies any thoughts of self-harm Review of Systems  Constitutional: Positive for activity change, appetite change and unexpected weight change.  HENT: Negative.   Eyes: Negative.   Respiratory: Negative.   Cardiovascular: Negative.   Gastrointestinal: Positive for abdominal pain.  Endocrine: Negative.   Genitourinary: Negative.   Musculoskeletal: Positive for arthralgias, back pain and neck pain.  Skin: Negative.   Allergic/Immunologic: Negative.   Hematological: Negative.   Psychiatric/Behavioral: Positive for agitation, decreased concentration, depression, dysphoric mood, hallucinations and sleep disturbance. The patient is nervous/anxious.    Physical Exam not done  Depressive Symptoms: depressed mood, anhedonia, insomnia, psychomotor retardation, fatigue, feelings of worthlessness/guilt, difficulty concentrating, hopelessness, anxiety, panic attacks, weight loss, decreased appetite,  (Hypo) Manic Symptoms:   Elevated Mood:  no Irritable Mood:  Yes Grandiosity:  No Distractibility:  Yes Labiality of Mood:  Yes Delusions:  Yes Hallucinations:  No Impulsivity:  No Sexually Inappropriate Behavior:  No Financial Extravagance:  No Flight of Ideas:  No  Anxiety Symptoms: Excessive Worry:  Yes Panic Symptoms:  Yes Agoraphobia:  Yes Obsessive Compulsive: Yes  Symptoms: Picks at nails Specific Phobias:  No Social Anxiety:  Yes  Psychotic Symptoms:  Hallucinations: No None Delusions:  Yes Paranoia:  Yes   Ideas of Reference:  No  PTSD Symptoms: Ever had a traumatic exposure:  No Had a traumatic exposure in the last month:  No Re-experiencing: No None Hypervigilance:  No Hyperarousal: No None Avoidance: No None  Traumatic Brain Injury: Yes Sports Related  Past Psychiatric History: Diagnosis: Bipolar disorder   Hospitalizations: Twice in the 1980s   Outpatient Care: At [facility], mental [facility], with private  practitioner   Substance Abuse Care: none  Self-Mutilation:none  Suicidal Attempts: none  Violent Behaviors: none   Past Medical History:   Past Medical History:  Diagnosis Date  . Anemia   . Anxiety   . Arthritis    chronic back pain  . Bipolar disorder ([place])   . Cancer [facility]) Jan 2013   kidney cancer, left, s/p partial nephrectomy  . Depression   . Diarrhea    incontinent stools  . Diverticulitis    treated at least 5 times in past, hospitalized twice  . Heart murmur   . MVP (mitral valve prolapse)   . Neuropathy ([place])   . Nocturia   . Osteoarthritis   . Osteoporosis   . PONV (postoperative nausea and vomiting)   . Psychotic affective disorder ([place])    "psychotic delusions" "I cut myself"   . Restless leg syndrome   . Rheumatic fever   . Urinary frequency    History of Loss of Consciousness:  No Seizure History:  No Cardiac History:  No Allergies:   Allergies  Allergen Reactions  . Shellfish Allergy Anaphylaxis  . Neurontin [Gabapentin] Other (  See Comments)    Unable to talk when takes medication  . Vicodin [Hydrocodone-Acetaminophen] Itching  . Codeine Itching     itching  . Haldol [Haloperidol Lactate] Other (See Comments)    Muscle spasms  . Hydromorphone Hcl Nausea And Vomiting  . Latex Rash  . Propoxyphene N-Acetaminophen Nausea And Vomiting   Current Medications:  Current Outpatient Prescriptions  Medication Sig Dispense Refill  . ALPRAZolam (XANAX) 1 MG tablet Take 1 tablet (1 mg total) by mouth 4 (four) times daily. 120 tablet 2  . atorvastatin (LIPITOR) 10 MG tablet Take 10 mg by mouth at bedtime.  1  . cholestyramine (QUESTRAN) 4 GM/DOSE powder TAKE 1 SCOOPFUL (4 G TOTAL) BY MOUTH DAILY. DO NOT TAKE WITHIN 2 HOURS OF OTHER MEDICATIONS 378 g 5  . FLUoxetine (PROZAC) 40 MG capsule Take 1 capsule (40 mg total) by mouth 2 (two) times daily. 60 capsule 2  . methocarbamol (ROBAXIN) 500 MG tablet Take 1.5 tablets (750 mg total) by mouth every 6 (six)  hours as needed. 60 tablet 1  . mirabegron ER (MYRBETRIQ) 50 MG TB24 tablet Take 50 mg by mouth daily.    . Multiple Vitamin (MULTIVITAMIN WITH MINERALS) TABS Take 1 tablet by mouth every evening.    . nitroGLYCERIN (NITROSTAT) 0.4 MG SL tablet Place 0.4 mg under the tongue every 5 (five) minutes as needed for chest pain.    . NON FORMULARY Vitamin for Hair, Nails and Skin    . rOPINIRole (REQUIP) 3 MG tablet TAKE 1 TABLET BY MOUTH 3 TIMES DAILY.  11  . methocarbamol (ROBAXIN) 750 MG tablet Take 750 mg by mouth daily as needed for muscle spasms.  0  . OLANZapine (ZYPREXA) 20 MG tablet Take 1 tablet (20 mg total) by mouth at bedtime. 30 tablet 2   No current facility-administered medications for this visit.     Previous Psychotropic Medications:  Medication Dose   Prozac, Symbyax, Xanax, Valium, BuSpar, lithium                        Substance Abuse History in the last 12 months: Substance Age of 1st Use Last Use Amount Specific Type  Nicotine      Alcohol      Cannabis      Opiates      Cocaine      Methamphetamines      LSD      Ecstasy      Benzodiazepines      Caffeine      Inhalants      Others:                          Medical Consequences of Substance Abuse: none  Legal Consequences of Substance Abuse: none  Family Consequences of Substance Abuse: none  Blackouts:  No DT's:  No Withdrawal Symptoms:  No None  Social History: Current Place of Residence: [place] of Birth: [place] Family Members: 2 children, several grandchildren Marital Status:  Divorced Children:   Sons: 1  Daughters: 1 Relationships: No friends Education:  Dentist Problems/Performance:  Religious Beliefs/Practices: none History of Abuse: none Pensions consultant; Artist, Catering manager History:  None. Legal History: none Hobbies/Interests: TV, sewing  Family History:   Family History  Problem Relation Age of  Onset  . Bipolar disorder Daughter   . Colon cancer Neg Hx     Mental Status  Examination/Evaluation: Objective:  Appearance: Neat and Well Groomed  Engineer, water::  Fair  Speech:  Clear and coherent   Volume:  Normal  Mood: Anxious   Affect Tired,Dysphoric   Thought Process:  Goal Directed  Orientation:  Full (Time, Place, and Person)  Thought Content:  normal  Suicidal Thoughts:  No  Homicidal Thoughts:  No  Judgement:  Fair  Insight:  Fair  Psychomotor Activity:  Normal   Akathisia:  No  Handed:  Right  AIMS (if indicated):    Assets:  Communication Skills Desire for Improvement Resilience Social Support Talents/Skills    Laboratory/X-Ray Psychological Evaluation(s)   Labs reviewed in the chart are unremarkable      Assessment:  Axis I: Bipolar, mixed  AXIS I Bipolar, mixed  AXIS II Deferred  AXIS III Past Medical History:  Diagnosis Date  . Anemia   . Anxiety   . Arthritis    chronic back pain  . Bipolar disorder ([place])   . Cancer [facility]) Jan 2013   kidney cancer, left, s/p partial nephrectomy  . Depression   . Diarrhea    incontinent stools  . Diverticulitis    treated at least 5 times in past, hospitalized twice  . Heart murmur   . MVP (mitral valve prolapse)   . Neuropathy ([place])   . Nocturia   . Osteoarthritis   . Osteoporosis   . PONV (postoperative nausea and vomiting)   . Psychotic affective disorder ([place])    "psychotic delusions" "I cut myself"   . Restless leg syndrome   . Rheumatic fever   . Urinary frequency      AXIS IV other psychosocial or environmental problems  AXIS V 41-50 serious symptoms   Treatment Plan/Recommendations:  Plan of Care: Medication management   Laboratory:  As noted in history of present illness   Psychotherapy:     She will will continue Prozac 80 mg every morning for depression and continue Zyprexa  And increase the dosage to 20 mg daily at bedtime for her bipolar disorder and delusions. She will continue Xanax  1 mg 4 times a day for anxiety   Routine PRN Medications:  No  Consultations:  Safety Concerns:  She denies thoughts of hurting self or others   Other:  She'll return in 4 weeks     Levonne Spiller, MD 3/2/201811:32 AM

## 2016-12-08 ENCOUNTER — Telehealth (HOSPITAL_COMMUNITY): Payer: Self-pay | Admitting: *Deleted

## 2016-12-08 NOTE — Telephone Encounter (Signed)
left voice message, provider out of office 12/17/16.

## 2016-12-15 ENCOUNTER — Telehealth (HOSPITAL_COMMUNITY): Payer: Self-pay | Admitting: *Deleted

## 2016-12-15 NOTE — Telephone Encounter (Signed)
left voice message, provider out of office 12/17/16.

## 2016-12-16 ENCOUNTER — Ambulatory Visit (INDEPENDENT_AMBULATORY_CARE_PROVIDER_SITE_OTHER): Payer: Medicare Other | Admitting: Psychiatry

## 2016-12-16 ENCOUNTER — Encounter (HOSPITAL_COMMUNITY): Payer: Self-pay | Admitting: Psychiatry

## 2016-12-16 VITALS — BP 122/71 | HR 96 | Wt 185.2 lb

## 2016-12-16 DIAGNOSIS — Z79899 Other long term (current) drug therapy: Secondary | ICD-10-CM | POA: Diagnosis not present

## 2016-12-16 DIAGNOSIS — F3130 Bipolar disorder, current episode depressed, mild or moderate severity, unspecified: Secondary | ICD-10-CM

## 2016-12-16 DIAGNOSIS — Z818 Family history of other mental and behavioral disorders: Secondary | ICD-10-CM

## 2016-12-16 NOTE — Progress Notes (Signed)
Patient ID: Victoria Mccarthy, female   DOB: 1946/03/06, 71 y.o.   MRN: [id number] Patient ID: Victoria Mccarthy, female   DOB: October 13, 1945, 71 y.o.   MRN: [id number] Patient ID: Victoria Mccarthy, female   DOB: 08-Apr-1946, 71 y.o.   MRN: [id number] Patient ID: Victoria Mccarthy, female   DOB: [birth year], 71 y.o.   MRN: [id number] Patient ID: Victoria Mccarthy, female   DOB: [birth year], 71 y.o.   MRN: [id number] Patient ID: Victoria Mccarthy, female   DOB: 16-Nov-1945, 71 y.o.   MRN: [id number] Patient ID: Victoria Mccarthy, female   DOB: Jan 20, 1946, 71 y.o.   MRN: [id number] Patient ID: Victoria Mccarthy, female   DOB: 1946/05/25, 71 y.o.   MRN: [id number]  Psychiatric Assessment Adult  Patient Identification:  Victoria Mccarthy Date of Evaluation:  12/16/2016 Chief Complaint: "I am still sad" History of Chief Complaint:   Chief Complaint  Patient presents with  . Manic Behavior  . Depression  . Follow-up    Depression         Associated symptoms include decreased concentration and appetite change.  Past medical history includes anxiety.   Anxiety  Symptoms include decreased concentration and nervous/anxious behavior.     this patient is a 71 year old divorced white female who lives alone in [place]. She has one son, one daughter, 6 grandchildren 3 great-grandchildren. 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  The patient states that she's had problems with mental illness since her 89s. She was hospitalized at [facility] in [facility] twice in the 1980s. During these times she was depressed extremely anxious and having paranoid delusions that she had killed someone and that the police were after her. At one point she was hearing voices telling her to hurt her self. She finally started seeing Dr. Charmayne Sheer private practitioner in [place] who treated her for years. In the past she's been on Prozac, Symbyax, Xanax, Valium, BuSpar and lithium.  After her psychiatrist retired, she began  going to the [facility] but got tired of waiting for hours for recheck visit. She most recently she's been going to [place] [facility]. The psychiatrist there has cut down her medication. She used to be on Symbiax 6/50-2 a day and she was cut down to 4-25, once a day. Since this happened she become extremely anxious and depressed. At the beginning of this year however her insurance would not pay for the medicine so she's been off it entirely and she is getting even worse. The physician also cut her Xanax down from 1 mg 4 times a day to 0.5 mg twice a day and she is exceedingly anxious.  Currently the patient states that she is depressed all the time, very anxious and tearful, irritable and angry. She can't stand to be around people and stays by herself with her dogs. She's trying to spend some time sewing but it's very hard for her to concentrate. Her sleep is poor and her mind races. She is again having the delusion that she may have killed someone at the police may be coming to get her. She has nightmares about this. It's hard for her to sit still. She does not have any direct thoughts of hurting self or others. She constantly picks at her fingernails. She denies any current auditory or visual hallucinations  The patient returns after 4 weeks. Last time she was very depressed since her mom died in a nursing home on  January 6. Last time I increased her olanzapine but she's not sure it's helped much. She still stays in her mother's house and ruminates. On the positive side she has made plans with her daughter to teach her daughter to sew and to make some summer clothes. She is going to try to visit a friend in [place]. She states this is "just something I need to go through right now" she is very nicely dressed today and denies being suicidal. She would rather not change her medications at the moment. I suggested a grief support group and she claims "I'm not ready for that." Review of Systems   Constitutional: Positive for activity change, appetite change and unexpected weight change.  HENT: Negative.   Eyes: Negative.   Respiratory: Negative.   Cardiovascular: Negative.   Gastrointestinal: Positive for abdominal pain.  Endocrine: Negative.   Genitourinary: Negative.   Musculoskeletal: Positive for arthralgias, back pain and neck pain.  Skin: Negative.   Allergic/Immunologic: Negative.   Hematological: Negative.   Psychiatric/Behavioral: Positive for agitation, decreased concentration, depression, dysphoric mood, hallucinations and sleep disturbance. The patient is nervous/anxious.    Physical Exam not done  Depressive Symptoms: depressed mood, anhedonia, insomnia, psychomotor retardation, fatigue, feelings of worthlessness/guilt, difficulty concentrating, hopelessness, anxiety, panic attacks, weight loss, decreased appetite,  (Hypo) Manic Symptoms:   Elevated Mood:  no Irritable Mood:  Yes Grandiosity:  No Distractibility:  Yes Labiality of Mood:  Yes Delusions:  Yes Hallucinations:  No Impulsivity:  No Sexually Inappropriate Behavior:  No Financial Extravagance:  No Flight of Ideas:  No  Anxiety Symptoms: Excessive Worry:  Yes Panic Symptoms:  Yes Agoraphobia:  Yes Obsessive Compulsive: Yes  Symptoms: Picks at nails Specific Phobias:  No Social Anxiety:  Yes  Psychotic Symptoms:  Hallucinations: No None Delusions:  Yes Paranoia:  Yes   Ideas of Reference:  No  PTSD Symptoms: Ever had a traumatic exposure:  No Had a traumatic exposure in the last month:  No Re-experiencing: No None Hypervigilance:  No Hyperarousal: No None Avoidance: No None  Traumatic Brain Injury: Yes Sports Related  Past Psychiatric History: Diagnosis: Bipolar disorder   Hospitalizations: Twice in the 1980s   Outpatient Care: At [facility] families, mental [place], with private practitioner   Substance Abuse Care: none  Self-Mutilation:none  Suicidal  Attempts: none  Violent Behaviors: none   Past Medical History:   Past Medical History:  Diagnosis Date  . Anemia   . Anxiety   . Arthritis    chronic back pain  . Bipolar disorder ([place])   . Cancer [facility]) Jan 2013   kidney cancer, left, s/p partial nephrectomy  . Depression   . Diarrhea    incontinent stools  . Diverticulitis    treated at least 5 times in past, hospitalized twice  . Heart murmur   . MVP (mitral valve prolapse)   . Neuropathy ([place])   . Nocturia   . Osteoarthritis   . Osteoporosis   . PONV (postoperative nausea and vomiting)   . Psychotic affective disorder ([place])    "psychotic delusions" "I cut myself"   . Restless leg syndrome   . Rheumatic fever   . Urinary frequency    History of Loss of Consciousness:  No Seizure History:  No Cardiac History:  No Allergies:   Allergies  Allergen Reactions  . Shellfish Allergy Anaphylaxis  . Neurontin [Gabapentin] Other (See Comments)    Unable to talk when takes medication  . Vicodin [Hydrocodone-Acetaminophen] Itching  .  Codeine Itching     itching  . Haldol [Haloperidol Lactate] Other (See Comments)    Muscle spasms  . Hydromorphone Hcl Nausea And Vomiting  . Latex Rash  . Propoxyphene N-Acetaminophen Nausea And Vomiting   Current Medications:  Current Outpatient Prescriptions  Medication Sig Dispense Refill  . ALPRAZolam (XANAX) 1 MG tablet Take 1 tablet (1 mg total) by mouth 4 (four) times daily. 120 tablet 2  . atorvastatin (LIPITOR) 10 MG tablet Take 10 mg by mouth at bedtime.  1  . cholestyramine (QUESTRAN) 4 GM/DOSE powder TAKE 1 SCOOPFUL (4 G TOTAL) BY MOUTH DAILY. DO NOT TAKE WITHIN 2 HOURS OF OTHER MEDICATIONS 378 g 5  . FLUoxetine (PROZAC) 40 MG capsule Take 1 capsule (40 mg total) by mouth 2 (two) times daily. 60 capsule 2  . methocarbamol (ROBAXIN) 500 MG tablet Take 1.5 tablets (750 mg total) by mouth every 6 (six) hours as needed. 60 tablet 1  . methocarbamol (ROBAXIN) 750 MG tablet Take 750  mg by mouth daily as needed for muscle spasms.  0  . mirabegron ER (MYRBETRIQ) 50 MG TB24 tablet Take 50 mg by mouth daily.    . Multiple Vitamin (MULTIVITAMIN WITH MINERALS) TABS Take 1 tablet by mouth every evening.    . nitroGLYCERIN (NITROSTAT) 0.4 MG SL tablet Place 0.4 mg under the tongue every 5 (five) minutes as needed for chest pain.    . NON FORMULARY Vitamin for Hair, Nails and Skin    . OLANZapine (ZYPREXA) 20 MG tablet Take 1 tablet (20 mg total) by mouth at bedtime. 30 tablet 2  . rOPINIRole (REQUIP) 3 MG tablet TAKE 1 TABLET BY MOUTH 3 TIMES DAILY.  11   No current facility-administered medications for this visit.     Previous Psychotropic Medications:  Medication Dose   Prozac, Symbyax, Xanax, Valium, BuSpar, lithium                        Substance Abuse History in the last 12 months: Substance Age of 1st Use Last Use Amount Specific Type  Nicotine      Alcohol      Cannabis      Opiates      Cocaine      Methamphetamines      LSD      Ecstasy      Benzodiazepines      Caffeine      Inhalants      Others:                          Medical Consequences of Substance Abuse: none  Legal Consequences of Substance Abuse: none  Family Consequences of Substance Abuse: none  Blackouts:  No DT's:  No Withdrawal Symptoms:  No None  Social History: Current Place of Residence: [place] of Birth: [place] Family Members: 2 children, several grandchildren Marital Status:  Divorced Children:   Sons: 1  Daughters: 1 Relationships: No friends Education:  Dentist Problems/Performance:  Religious Beliefs/Practices: none History of Abuse: none Pensions consultant; Artist, Catering manager History:  None. Legal History: none Hobbies/Interests: TV, sewing  Family History:   Family History  Problem Relation Age of Onset  . Bipolar disorder Daughter   . Colon cancer Neg Hx     Mental  Status Examination/Evaluation: Objective:  Appearance: Neat and Well Groomed  Eye Contact::  Fair  Speech:  Clear and  coherent   Volume:  Normal  Mood: Depressed   Affect Tired,Dysphoric   Thought Process:  Goal Directed  Orientation:  Full (Time, Place, and Person)  Thought Content:  normal  Suicidal Thoughts:  No  Homicidal Thoughts:  No  Judgement:  Fair  Insight:  Fair  Psychomotor Activity:  Normal   Akathisia:  No  Handed:  Right  AIMS (if indicated):    Assets:  Communication Skills Desire for Improvement Resilience Social Support Talents/Skills    Laboratory/X-Ray Psychological Evaluation(s)   Labs reviewed in the chart are unremarkable      Assessment:  Axis I: Bipolar, mixed  AXIS I Bipolar, mixed  AXIS II Deferred  AXIS III Past Medical History:  Diagnosis Date  . Anemia   . Anxiety   . Arthritis    chronic back pain  . Bipolar disorder ([place])   . Cancer [facility]) Jan 2013   kidney cancer, left, s/p partial nephrectomy  . Depression   . Diarrhea    incontinent stools  . Diverticulitis    treated at least 5 times in past, hospitalized twice  . Heart murmur   . MVP (mitral valve prolapse)   . Neuropathy ([place])   . Nocturia   . Osteoarthritis   . Osteoporosis   . PONV (postoperative nausea and vomiting)   . Psychotic affective disorder ([place])    "psychotic delusions" "I cut myself"   . Restless leg syndrome   . Rheumatic fever   . Urinary frequency      AXIS IV other psychosocial or environmental problems  AXIS V 41-50 serious symptoms   Treatment Plan/Recommendations:  Plan of Care: Medication management   Laboratory:  As noted in history of present illness   Psychotherapy:     She will will continue Prozac 80 mg every morning for depression and continue Zyprexa 20 mg daily at bedtime for her bipolar disorder and delusions. She will continue Xanax 1 mg 4 times a day for anxiety   Routine PRN Medications:  No  Consultations:  Safety Concerns:   She denies thoughts of hurting self or others   Other:  She'll return in 6 weeks     Victoria Spiller, MD 3/29/201811:23 AM

## 2016-12-17 ENCOUNTER — Ambulatory Visit (HOSPITAL_COMMUNITY): Payer: Self-pay | Admitting: Psychiatry

## 2016-12-28 ENCOUNTER — Ambulatory Visit (INDEPENDENT_AMBULATORY_CARE_PROVIDER_SITE_OTHER): Payer: Medicare Other | Admitting: Urology

## 2016-12-28 ENCOUNTER — Other Ambulatory Visit (HOSPITAL_COMMUNITY)
Admission: AD | Admit: 2016-12-28 | Discharge: 2016-12-28 | Disposition: A | Discharge status: Home / Self Care | Payer: Medicare Other | Source: Skilled Nursing Facility | Attending: Urology | Admitting: Urology

## 2016-12-28 DIAGNOSIS — C642 Malignant neoplasm of left kidney, except renal pelvis: Secondary | ICD-10-CM

## 2016-12-28 DIAGNOSIS — N3281 Overactive bladder: Secondary | ICD-10-CM | POA: Diagnosis not present

## 2016-12-28 DIAGNOSIS — R8271 Bacteriuria: Secondary | ICD-10-CM | POA: Diagnosis not present

## 2016-12-31 LAB — URINE CULTURE

## 2017-01-18 DIAGNOSIS — R202 Paresthesia of skin: Secondary | ICD-10-CM | POA: Diagnosis not present

## 2017-01-18 DIAGNOSIS — Z1389 Encounter for screening for other disorder: Secondary | ICD-10-CM | POA: Diagnosis not present

## 2017-01-18 DIAGNOSIS — M545 Low back pain: Secondary | ICD-10-CM | POA: Diagnosis not present

## 2017-01-18 DIAGNOSIS — Z6832 Body mass index (BMI) 32.0-32.9, adult: Secondary | ICD-10-CM | POA: Diagnosis not present

## 2017-01-18 DIAGNOSIS — G2581 Restless legs syndrome: Secondary | ICD-10-CM | POA: Diagnosis not present

## 2017-01-18 DIAGNOSIS — E782 Mixed hyperlipidemia: Secondary | ICD-10-CM | POA: Diagnosis not present

## 2017-01-18 DIAGNOSIS — E559 Vitamin D deficiency, unspecified: Secondary | ICD-10-CM | POA: Diagnosis not present

## 2017-01-26 ENCOUNTER — Ambulatory Visit (HOSPITAL_COMMUNITY): Payer: Self-pay | Admitting: Psychiatry

## 2017-01-27 ENCOUNTER — Ambulatory Visit (HOSPITAL_COMMUNITY): Payer: Self-pay | Admitting: Psychiatry

## 2017-02-02 ENCOUNTER — Ambulatory Visit (INDEPENDENT_AMBULATORY_CARE_PROVIDER_SITE_OTHER): Payer: Medicare Other | Admitting: Psychiatry

## 2017-02-02 ENCOUNTER — Encounter (HOSPITAL_COMMUNITY): Payer: Self-pay | Admitting: Psychiatry

## 2017-02-02 VITALS — BP 133/86 | HR 93 | Ht 65.0 in | Wt 188.0 lb

## 2017-02-02 DIAGNOSIS — Z79899 Other long term (current) drug therapy: Secondary | ICD-10-CM | POA: Diagnosis not present

## 2017-02-02 DIAGNOSIS — F3130 Bipolar disorder, current episode depressed, mild or moderate severity, unspecified: Secondary | ICD-10-CM | POA: Diagnosis not present

## 2017-02-02 DIAGNOSIS — Z818 Family history of other mental and behavioral disorders: Secondary | ICD-10-CM

## 2017-02-02 MED ORDER — OLANZAPINE 20 MG PO TABS
20.0000 mg | ORAL_TABLET | Freq: Every day | ORAL | 2 refills | Status: DC
Start: 2017-02-02 — End: 2017-03-10

## 2017-02-02 MED ORDER — ALPRAZOLAM 1 MG PO TABS: 1.0000 mg | ORAL_TABLET | Freq: Four times a day (QID) | ORAL | 2 refills | Status: DC

## 2017-02-02 MED ORDER — FLUOXETINE HCL 40 MG PO CAPS
40.0000 mg | ORAL_CAPSULE | Freq: Two times a day (BID) | ORAL | 2 refills | Status: DC
Start: 2017-02-02 — End: 2017-03-10

## 2017-02-02 MED ORDER — TRAZODONE HCL 50 MG PO TABS: ORAL_TABLET | ORAL | 2 refills | Status: DC

## 2017-02-02 NOTE — Progress Notes (Signed)
Patient ID: Victoria Mccarthy, female   DOB: Oct 20, 1945, 71 y.o.   MRN: [id number] Patient ID: Victoria Mccarthy, female   DOB: [birth year], 71 y.o.   MRN: [id number] Patient ID: Victoria Mccarthy, female   DOB: 11-30-1945, 71 y.o.   MRN: [id number] Patient ID: Victoria Mccarthy, female   DOB: 1946-07-31, 71 y.o.   MRN: [id number] Patient ID: Victoria Mccarthy, female   DOB: 1946/01/01, 71 y.o.   MRN: [id number] Patient ID: Victoria Mccarthy, female   DOB: [birth year], 71 y.o.   MRN: [id number] Patient ID: Victoria Mccarthy, female   DOB: 09-01-1946, 71 y.o.   MRN: [id number] Patient ID: Victoria Mccarthy, female   DOB: 1946-03-03, 71 y.o.   MRN: [id number]  Psychiatric Assessment Adult  Patient Identification:  Victoria Mccarthy Date of Evaluation:  02/02/2017 Chief Complaint: "I am still sad" History of Chief Complaint:   Chief Complaint  Patient presents with  . Depression  . Anxiety  . Manic Behavior  . Follow-up    Depression         Associated symptoms include decreased concentration and appetite change.  Past medical history includes anxiety.   Anxiety  Symptoms include decreased concentration and nervous/anxious behavior.     this patient is a 71 year old divorced white female who lives alone in [place]. She has one son, one daughter, 6 grandchildren 3 great-grandchildren. 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  The patient states that she's had problems with mental illness since her 45s. She was hospitalized at [facility] in [facility] twice in the 1980s. During these times she was depressed extremely anxious and having paranoid delusions that she had killed someone and that the police were after her. At one point she was hearing voices telling her to hurt her self. She finally started seeing Dr. Charmayne Sheer private practitioner in [place] who treated her for years. In the past she's been on Prozac, Symbyax, Xanax, Valium, BuSpar and lithium.  After her psychiatrist retired,  she began going to the [facility] but got tired of waiting for hours for recheck visit. She most recently she's been going to [place] [facility]. The psychiatrist there has cut down her medication. She used to be on Symbiax 6/50-2 a day and she was cut down to 4-25, once a day. Since this happened she become extremely anxious and depressed. At the beginning of this year however her insurance would not pay for the medicine so she's been off it entirely and she is getting even worse. The physician also cut her Xanax down from 1 mg 4 times a day to 0.5 mg twice a day and she is exceedingly anxious.  Currently the patient states that she is depressed all the time, very anxious and tearful, irritable and angry. She can't stand to be around people and stays by herself with her dogs. She's trying to spend some time sewing but it's very hard for her to concentrate. Her sleep is poor and her mind races. She is again having the delusion that she may have killed someone at the police may be coming to get her. She has nightmares about this. It's hard for her to sit still. She does not have any direct thoughts of hurting self or others. She constantly picks at her fingernails. She denies any current auditory or visual hallucinations  The patient returns after 2 months. She states that she still depressed primarily about her mother's death in  January. She is doing a little bit more and has started sewing again. She can't sleep at night and feels tired during the day. She's still focusing on the date of her mom's death and the fact that she put her in a nursing home and she feels guilty about it. She is willing to come to therapy now and we will get her scheduled with her new therapist as soon as we can. I suggested we add trazodone to help her sleep. She denies suicidal ideation " Review of Systems  Constitutional: Positive for activity change, appetite change and unexpected weight change.  HENT: Negative.    Eyes: Negative.   Respiratory: Negative.   Cardiovascular: Negative.   Gastrointestinal: Positive for abdominal pain.  Endocrine: Negative.   Genitourinary: Negative.   Musculoskeletal: Positive for arthralgias, back pain and neck pain.  Skin: Negative.   Allergic/Immunologic: Negative.   Hematological: Negative.   Psychiatric/Behavioral: Positive for agitation, decreased concentration, depression, dysphoric mood, hallucinations and sleep disturbance. The patient is nervous/anxious.    Physical Exam not done  Depressive Symptoms: depressed mood, anhedonia, insomnia, psychomotor retardation, fatigue, feelings of worthlessness/guilt, difficulty concentrating, hopelessness, anxiety, panic attacks, weight loss, decreased appetite,  (Hypo) Manic Symptoms:   Elevated Mood:  no Irritable Mood:  Yes Grandiosity:  No Distractibility:  Yes Labiality of Mood:  Yes Delusions:  Yes Hallucinations:  No Impulsivity:  No Sexually Inappropriate Behavior:  No Financial Extravagance:  No Flight of Ideas:  No  Anxiety Symptoms: Excessive Worry:  Yes Panic Symptoms:  Yes Agoraphobia:  Yes Obsessive Compulsive: Yes  Symptoms: Picks at nails Specific Phobias:  No Social Anxiety:  Yes  Psychotic Symptoms:  Hallucinations: No None Delusions:  Yes Paranoia:  Yes   Ideas of Reference:  No  PTSD Symptoms: Ever had a traumatic exposure:  No Had a traumatic exposure in the last month:  No Re-experiencing: No None Hypervigilance:  No Hyperarousal: No None Avoidance: No None  Traumatic Brain Injury: Yes Sports Related  Past Psychiatric History: Diagnosis: Bipolar disorder   Hospitalizations: Twice in the 1980s   Outpatient Care: At [facility] and families, mental [place], with private practitioner   Substance Abuse Care: none  Self-Mutilation:none  Suicidal Attempts: none  Violent Behaviors: none   Past Medical History:   Past Medical History:  Diagnosis Date  . Anemia    . Anxiety   . Arthritis    chronic back pain  . Bipolar disorder ([place])   . Cancer [facility]) Jan 2013   kidney cancer, left, s/p partial nephrectomy  . Depression   . Diarrhea    incontinent stools  . Diverticulitis    treated at least 5 times in past, hospitalized twice  . Heart murmur   . MVP (mitral valve prolapse)   . Neuropathy   . Nocturia   . Osteoarthritis   . Osteoporosis   . PONV (postoperative nausea and vomiting)   . Psychotic affective disorder ([place])    "psychotic delusions" "I cut myself"   . Restless leg syndrome   . Rheumatic fever   . Urinary frequency    History of Loss of Consciousness:  No Seizure History:  No Cardiac History:  No Allergies:   Allergies  Allergen Reactions  . Shellfish Allergy Anaphylaxis  . Neurontin [Gabapentin] Other (See Comments)    Unable to talk when takes medication  . Vicodin [Hydrocodone-Acetaminophen] Itching  . Codeine Itching     itching  . Haldol [Haloperidol Lactate] Other (See Comments)  Muscle spasms  . Hydromorphone Hcl Nausea And Vomiting  . Latex Rash  . Propoxyphene N-Acetaminophen Nausea And Vomiting   Current Medications:  Current Outpatient Prescriptions  Medication Sig Dispense Refill  . ALPRAZolam (XANAX) 1 MG tablet Take 1 tablet (1 mg total) by mouth 4 (four) times daily. 120 tablet 2  . atorvastatin (LIPITOR) 10 MG tablet Take 10 mg by mouth at bedtime.  1  . Cholecalciferol (VITAMIN D3) 50000 units CAPS Take 1 capsule by mouth once a week.  0  . cholestyramine (QUESTRAN) 4 GM/DOSE powder TAKE 1 SCOOPFUL (4 G TOTAL) BY MOUTH DAILY. DO NOT TAKE WITHIN 2 HOURS OF OTHER MEDICATIONS 378 g 5  . FLUoxetine (PROZAC) 40 MG capsule Take 1 capsule (40 mg total) by mouth 2 (two) times daily. 60 capsule 2  . methocarbamol (ROBAXIN) 500 MG tablet Take 1.5 tablets (750 mg total) by mouth every 6 (six) hours as needed. 60 tablet 1  . methocarbamol (ROBAXIN) 750 MG tablet Take 750 mg by mouth daily as needed for  muscle spasms.  0  . Multiple Vitamin (MULTIVITAMIN WITH MINERALS) TABS Take 1 tablet by mouth every evening.    . nitroGLYCERIN (NITROSTAT) 0.4 MG SL tablet Place 0.4 mg under the tongue every 5 (five) minutes as needed for chest pain.    . NON FORMULARY Vitamin for Hair, Nails and Skin    . OLANZapine (ZYPREXA) 20 MG tablet Take 1 tablet (20 mg total) by mouth at bedtime. 30 tablet 2  . oxybutynin (DITROPAN XL) 15 MG 24 hr tablet Take 15 mg by mouth daily.  11  . rOPINIRole (REQUIP) 3 MG tablet TAKE 1 TABLET BY MOUTH 3 TIMES DAILY.  11  . traZODone (DESYREL) 50 MG tablet Take one or 2 at bedtime 60 tablet 2   No current facility-administered medications for this visit.     Previous Psychotropic Medications:  Medication Dose   Prozac, Symbyax, Xanax, Valium, BuSpar, lithium                        Substance Abuse History in the last 12 months: Substance Age of 1st Use Last Use Amount Specific Type  Nicotine      Alcohol      Cannabis      Opiates      Cocaine      Methamphetamines      LSD      Ecstasy      Benzodiazepines      Caffeine      Inhalants      Others:                          Medical Consequences of Substance Abuse: none  Legal Consequences of Substance Abuse: none  Family Consequences of Substance Abuse: none  Blackouts:  No DT's:  No Withdrawal Symptoms:  No None  Social History: Current Place of Residence: [place] of Birth: [place] Family Members: 2 children, several grandchildren Marital Status:  Divorced Children:   Sons: 1  Daughters: 1 Relationships: No friends Education:  Dentist Problems/Performance:  Religious Beliefs/Practices: none History of Abuse: none Pensions consultant; Artist, Catering manager History:  None. Legal History: none Hobbies/Interests: TV, sewing  Family History:   Family History  Problem Relation Age of Onset  . Bipolar disorder Daughter    . Colon cancer Neg Hx     Mental Status Examination/Evaluation:  Objective:  Appearance: Neat and Well Groomed  Engineer, water::  Fair  Speech:  Clear and coherent   Volume:  Normal  Mood:Somewhat depressed   Affect Tired,Dysphoric   Thought Process:  Goal Directed  Orientation:  Full (Time, Place, and Person)  Thought Content:  normal  Suicidal Thoughts:  No  Homicidal Thoughts:  No  Judgement:  Fair  Insight:  Fair  Psychomotor Activity:  Normal   Akathisia:  No  Handed:  Right  AIMS (if indicated):    Assets:  Communication Skills Desire for Improvement Resilience Social Support Talents/Skills    Laboratory/X-Ray Psychological Evaluation(s)   Labs reviewed in the chart are unremarkable      Assessment:  Axis I: Bipolar, mixed  AXIS I Bipolar, mixed  AXIS II Deferred  AXIS III Past Medical History:  Diagnosis Date  . Anemia   . Anxiety   . Arthritis    chronic back pain  . Bipolar disorder ([place])   . Cancer [facility]) Jan 2013   kidney cancer, left, s/p partial nephrectomy  . Depression   . Diarrhea    incontinent stools  . Diverticulitis    treated at least 5 times in past, hospitalized twice  . Heart murmur   . MVP (mitral valve prolapse)   . Neuropathy   . Nocturia   . Osteoarthritis   . Osteoporosis   . PONV (postoperative nausea and vomiting)   . Psychotic affective disorder ([place])    "psychotic delusions" "I cut myself"   . Restless leg syndrome   . Rheumatic fever   . Urinary frequency      AXIS IV other psychosocial or environmental problems  AXIS V 41-50 serious symptoms   Treatment Plan/Recommendations:  Plan of Care: Medication management   Laboratory:  As noted in history of present illness   Psychotherapy:     She will will continue Prozac 80 mg every morning for depression and continue Zyprexa 20 mg daily at bedtime for her bipolar disorder and delusions. She will continue Xanax 1 mg 4 times a day for anxiety.We will add trazodone 50-100  mg at bedtime for sleep   Routine PRN Medications:  No  Consultations:  Safety Concerns:  She denies thoughts of hurting self or others   Other:  She'll return in 6 weeks     Levonne Spiller, MD 5/16/201811:49 AM

## 2017-02-07 DIAGNOSIS — G5711 Meralgia paresthetica, right lower limb: Secondary | ICD-10-CM | POA: Diagnosis not present

## 2017-03-01 DIAGNOSIS — G5711 Meralgia paresthetica, right lower limb: Secondary | ICD-10-CM | POA: Diagnosis not present

## 2017-03-10 ENCOUNTER — Ambulatory Visit (INDEPENDENT_AMBULATORY_CARE_PROVIDER_SITE_OTHER): Payer: Medicare Other | Admitting: Psychiatry

## 2017-03-10 ENCOUNTER — Encounter (HOSPITAL_COMMUNITY): Payer: Self-pay | Admitting: Psychiatry

## 2017-03-10 VITALS — BP 102/70 | HR 109 | Ht 65.0 in | Wt 182.0 lb

## 2017-03-10 DIAGNOSIS — Z818 Family history of other mental and behavioral disorders: Secondary | ICD-10-CM

## 2017-03-10 DIAGNOSIS — F3130 Bipolar disorder, current episode depressed, mild or moderate severity, unspecified: Secondary | ICD-10-CM | POA: Diagnosis not present

## 2017-03-10 MED ORDER — FLUOXETINE HCL 40 MG PO CAPS: 40.0000 mg | ORAL_CAPSULE | Freq: Two times a day (BID) | ORAL | 2 refills | Status: DC

## 2017-03-10 MED ORDER — OLANZAPINE 20 MG PO TABS: 20.0000 mg | ORAL_TABLET | Freq: Every day | ORAL | 2 refills | Status: DC

## 2017-03-10 MED ORDER — TRAZODONE HCL 50 MG PO TABS: ORAL_TABLET | ORAL | 2 refills | Status: DC

## 2017-03-10 MED ORDER — BUPROPION HCL 75 MG PO TABS: 75.0000 mg | ORAL_TABLET | ORAL | 2 refills | Status: DC

## 2017-03-10 MED ORDER — ALPRAZOLAM 1 MG PO TABS: 1.0000 mg | ORAL_TABLET | Freq: Four times a day (QID) | ORAL | 2 refills | Status: DC

## 2017-03-10 NOTE — Progress Notes (Signed)
Patient ID: Victoria Mccarthy, female   DOB: 1946-02-09, 71 y.o.   MRN: [id number] Patient ID: Victoria Mccarthy, female   DOB: 05/12/1946, 71 y.o.   MRN: [id number] Patient ID: Victoria Mccarthy, female   DOB: 08-07-1946, 71 y.o.   MRN: [id number] Patient ID: Victoria Mccarthy, female   DOB: 06/10/1946, 71 y.o.   MRN: [id number] Patient ID: Victoria Mccarthy, female   DOB: November 30, 1945, 71 y.o.   MRN: [id number] Patient ID: Victoria Mccarthy, female   DOB: [birth year], 71 y.o.   MRN: [id number] Patient ID: Victoria Mccarthy, female   DOB: 1946/05/23, 71 y.o.   MRN: [id number] Patient ID: Victoria Mccarthy, female   DOB: 06/29/1946, 71 y.o.   MRN: [id number]  Psychiatric Assessment Adult  Patient Identification:  Victoria Mccarthy Date of Evaluation:  03/10/2017 Chief Complaint: "I am still sad" History of Chief Complaint:   Chief Complaint  Patient presents with  . Depression  . Anxiety  . Manic Behavior  . Follow-up    Depression         Associated symptoms include decreased concentration and appetite change.  Past medical history includes anxiety.   Anxiety  Symptoms include decreased concentration and nervous/anxious behavior.     this patient is a 71 year old divorced white female who lives alone in [place]. She has one son, one daughter, 6 grandchildren 3 great-grandchildren. 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  The patient states that she's had problems with mental illness since her 71s. She was hospitalized at [facility] twice in the 1980s. During these times she was depressed extremely anxious and having paranoid delusions that she had killed someone and that the police were after her. At one point she was hearing voices telling her to hurt her self. She finally started seeing Dr. Charmayne Sheer private practitioner in [place] who treated her for years. In the past she's been on Prozac, Symbyax, Xanax, Valium, BuSpar and lithium.  After her psychiatrist retired,  she began going to the [facility] but got tired of waiting for hours for recheck visit. She most recently she's been going to [place]. The psychiatrist there has cut down her medication. She used to be on Symbiax 6/50-2 a day and she was cut down to 4-25, once a day. Since this happened she become extremely anxious and depressed. At the beginning of this year however her insurance would not pay for the medicine so she's been off it entirely and she is getting even worse. The physician also cut her Xanax down from 1 mg 4 times a day to 0.5 mg twice a day and she is exceedingly anxious.  Currently the patient states that she is depressed all the time, very anxious and tearful, irritable and angry. She can't stand to be around people and stays by herself with her dogs. She's trying to spend some time sewing but it's very hard for her to concentrate. Her sleep is poor and her mind races. She is again having the delusion that she may have killed someone at the police may be coming to get her. She has nightmares about this. It's hard for her to sit still. She does not have any direct thoughts of hurting self or others. She constantly picks at her fingernails. She denies any current auditory or visual hallucinations  The patient returns after 6 weeks. She states that she still has no energy to do anything. She  had a complete physical and lab workup that her primary physician and nothing showed up as abnormal. Last time she states she is doing a bit better but she claims now she just sits in her house and has no motivation to do her sewing or even to clean. She is already on a high dose of Prozac and Zyprexa so I suggested we add a little bit of Wellbutrin. She is sleeping better with the trazodone She denies auditory or visual hallucinations but she is really having a hard time getting through her mother's death in 17-Nov-2022. We will have her scheduled with her new therapist. She denies any suicidal  ideations Review of Systems  Constitutional: Positive for activity change, appetite change and unexpected weight change.  HENT: Negative.   Eyes: Negative.   Respiratory: Negative.   Cardiovascular: Negative.   Gastrointestinal: Positive for abdominal pain.  Endocrine: Negative.   Genitourinary: Negative.   Musculoskeletal: Positive for arthralgias, back pain and neck pain.  Skin: Negative.   Allergic/Immunologic: Negative.   Hematological: Negative.   Psychiatric/Behavioral: Positive for agitation, decreased concentration, depression, dysphoric mood, hallucinations and sleep disturbance. The patient is nervous/anxious.    Physical Exam not done  Depressive Symptoms: depressed mood, anhedonia, insomnia, psychomotor retardation, fatigue, feelings of worthlessness/guilt, difficulty concentrating, hopelessness, anxiety, panic attacks, weight loss, decreased appetite,  (Hypo) Manic Symptoms:   Elevated Mood:  no Irritable Mood:  Yes Grandiosity:  No Distractibility:  Yes Labiality of Mood:  Yes Delusions:  Yes Hallucinations:  No Impulsivity:  No Sexually Inappropriate Behavior:  No Financial Extravagance:  No Flight of Ideas:  No  Anxiety Symptoms: Excessive Worry:  Yes Panic Symptoms:  Yes Agoraphobia:  Yes Obsessive Compulsive: Yes  Symptoms: Picks at nails Specific Phobias:  No Social Anxiety:  Yes  Psychotic Symptoms:  Hallucinations: No None Delusions:  Yes Paranoia:  Yes   Ideas of Reference:  No  PTSD Symptoms: Ever had a traumatic exposure:  No Had a traumatic exposure in the last month:  No Re-experiencing: No None Hypervigilance:  No Hyperarousal: No None Avoidance: No None  Traumatic Brain Injury: Yes Sports Related  Past Psychiatric History: Diagnosis: Bipolar disorder   Hospitalizations: Twice in the 1980s   Outpatient Care: At [facility], mental [place], with private practitioner   Substance Abuse Care: none   Self-Mutilation:none  Suicidal Attempts: none  Violent Behaviors: none   Past Medical History:   Past Medical History:  Diagnosis Date  . Anemia   . Anxiety   . Arthritis    chronic back pain  . Bipolar disorder ([place])   . Cancer [facility]) 11-18-2011   kidney cancer, left, s/p partial nephrectomy  . Depression   . Diarrhea    incontinent stools  . Diverticulitis    treated at least 5 times in past, hospitalized twice  . Heart murmur   . MVP (mitral valve prolapse)   . Neuropathy   . Nocturia   . Osteoarthritis   . Osteoporosis   . PONV (postoperative nausea and vomiting)   . Psychotic affective disorder ([place])    "psychotic delusions" "I cut myself"   . Restless leg syndrome   . Rheumatic fever   . Urinary frequency    History of Loss of Consciousness:  No Seizure History:  No Cardiac History:  No Allergies:   Allergies  Allergen Reactions  . Shellfish Allergy Anaphylaxis  . Neurontin [Gabapentin] Other (See Comments)    Unable to talk when takes medication  .  Vicodin [Hydrocodone-Acetaminophen] Itching  . Codeine Itching     itching  . Haldol [Haloperidol Lactate] Other (See Comments)    Muscle spasms  . Hydromorphone Hcl Nausea And Vomiting  . Latex Rash  . Propoxyphene N-Acetaminophen Nausea And Vomiting   Current Medications:  Current Outpatient Prescriptions  Medication Sig Dispense Refill  . ALPRAZolam (XANAX) 1 MG tablet Take 1 tablet (1 mg total) by mouth 4 (four) times daily. 120 tablet 2  . atorvastatin (LIPITOR) 10 MG tablet Take 10 mg by mouth at bedtime.  1  . Cholecalciferol (VITAMIN D3) 50000 units CAPS Take 1 capsule by mouth once a week.  0  . cholestyramine (QUESTRAN) 4 GM/DOSE powder TAKE 1 SCOOPFUL (4 G TOTAL) BY MOUTH DAILY. DO NOT TAKE WITHIN 2 HOURS OF OTHER MEDICATIONS 378 g 5  . FLUoxetine (PROZAC) 40 MG capsule Take 1 capsule (40 mg total) by mouth 2 (two) times daily. 60 capsule 2  . methocarbamol (ROBAXIN) 500 MG tablet Take 1.5 tablets  (750 mg total) by mouth every 6 (six) hours as needed. 60 tablet 1  . methocarbamol (ROBAXIN) 750 MG tablet Take 750 mg by mouth daily as needed for muscle spasms.  0  . Multiple Vitamin (MULTIVITAMIN WITH MINERALS) TABS Take 1 tablet by mouth every evening.    . nitroGLYCERIN (NITROSTAT) 0.4 MG SL tablet Place 0.4 mg under the tongue every 5 (five) minutes as needed for chest pain.    . NON FORMULARY Vitamin for Hair, Nails and Skin    . OLANZapine (ZYPREXA) 20 MG tablet Take 1 tablet (20 mg total) by mouth at bedtime. 30 tablet 2  . oxybutynin (DITROPAN XL) 15 MG 24 hr tablet Take 15 mg by mouth daily.  11  . rOPINIRole (REQUIP) 3 MG tablet TAKE 1 TABLET BY MOUTH 3 TIMES DAILY.  11  . traZODone (DESYREL) 50 MG tablet Take one or 2 at bedtime 60 tablet 2  . buPROPion (WELLBUTRIN) 75 MG tablet Take 1 tablet (75 mg total) by mouth every morning. 30 tablet 2   No current facility-administered medications for this visit.     Previous Psychotropic Medications:  Medication Dose   Prozac, Symbyax, Xanax, Valium, BuSpar, lithium                        Substance Abuse History in the last 12 months: Substance Age of 1st Use Last Use Amount Specific Type  Nicotine      Alcohol      Cannabis      Opiates      Cocaine      Methamphetamines      LSD      Ecstasy      Benzodiazepines      Caffeine      Inhalants      Others:                          Medical Consequences of Substance Abuse: none  Legal Consequences of Substance Abuse: none  Family Consequences of Substance Abuse: none  Blackouts:  No DT's:  No Withdrawal Symptoms:  No None  Social History: Current Place of Residence: [place] of Birth: [place] Family Members: 2 children, several grandchildren Marital Status:  Divorced Children:   Sons: 1  Daughters: 1 Relationships: No friends Education:  Dentist Problems/Performance:  Religious Beliefs/Practices:  none History of Abuse: none Pensions consultant; Artist, portrait  Hospital doctor History:  None. Legal History: none Hobbies/Interests: TV, sewing  Family History:   Family History  Problem Relation Age of Onset  . Bipolar disorder Daughter   . Colon cancer Neg Hx     Mental Status Examination/Evaluation: Objective:  Appearance: Neat and Well Groomed wearing lots of makeup and jewelry   Eye Contact::  Fair  Speech:  Clear and coherent   Volume:  Normal  Mood: depressed   Affect Tired,Dysphoric   Thought Process:  Goal Directed  Orientation:  Full (Time, Place, and Person)  Thought Content:  normal  Suicidal Thoughts:  No  Homicidal Thoughts:  No  Judgement:  Fair  Insight:  Fair  Psychomotor Activity:  Normal   Akathisia:  No  Handed:  Right  AIMS (if indicated):    Assets:  Communication Skills Desire for Improvement Resilience Social Support Talents/Skills    Laboratory/X-Ray Psychological Evaluation(s)   Labs reviewed in the chart are unremarkable      Assessment:  Axis I: Bipolar, mixed  AXIS I Bipolar, mixed  AXIS II Deferred  AXIS III Past Medical History:  Diagnosis Date  . Anemia   . Anxiety   . Arthritis    chronic back pain  . Bipolar disorder ([place])   . Cancer [facility]) Jan 2013   kidney cancer, left, s/p partial nephrectomy  . Depression   . Diarrhea    incontinent stools  . Diverticulitis    treated at least 5 times in past, hospitalized twice  . Heart murmur   . MVP (mitral valve prolapse)   . Neuropathy   . Nocturia   . Osteoarthritis   . Osteoporosis   . PONV (postoperative nausea and vomiting)   . Psychotic affective disorder ([place])    "psychotic delusions" "I cut myself"   . Restless leg syndrome   . Rheumatic fever   . Urinary frequency      AXIS IV other psychosocial or environmental problems  AXIS V 41-50 serious symptoms   Treatment Plan/Recommendations:  Plan of Care: Medication management   Laboratory:  As  noted in history of present illness   Psychotherapy:  He'll be scheduled for therapy today    She will will continue Prozac 80 mg every morning for depression and continue Zyprexa 20 mg daily at bedtime for her bipolar disorder and delusions. She will continue Xanax 1 mg 4 times a day for anxiety.We will Continue trazodone 50-100 mg at bedtime for sleep and add Wellbutrin 75 mg every morning for depression   Routine PRN Medications:  No  Consultations:  Safety Concerns:  She denies thoughts of hurting self or others   Other:  She'll return in 6 weeks     Levonne Spiller, MD 6/21/20181:39 PM

## 2017-03-17 ENCOUNTER — Ambulatory Visit (HOSPITAL_COMMUNITY): Payer: Self-pay | Admitting: Licensed Clinical Social Worker

## 2017-03-28 ENCOUNTER — Encounter (HOSPITAL_COMMUNITY): Payer: Self-pay | Admitting: Licensed Clinical Social Worker

## 2017-03-28 ENCOUNTER — Ambulatory Visit (INDEPENDENT_AMBULATORY_CARE_PROVIDER_SITE_OTHER): Payer: Medicare Other | Admitting: Licensed Clinical Social Worker

## 2017-03-28 DIAGNOSIS — F3130 Bipolar disorder, current episode depressed, mild or moderate severity, unspecified: Secondary | ICD-10-CM
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
## 2017-04-05 ENCOUNTER — Ambulatory Visit (INDEPENDENT_AMBULATORY_CARE_PROVIDER_SITE_OTHER): Payer: Medicare Other | Admitting: Urology

## 2017-04-05 DIAGNOSIS — N3281 Overactive bladder: Secondary | ICD-10-CM | POA: Diagnosis not present

## 2017-04-05 DIAGNOSIS — N3944 Nocturnal enuresis: Secondary | ICD-10-CM

## 2017-04-12 ENCOUNTER — Ambulatory Visit (INDEPENDENT_AMBULATORY_CARE_PROVIDER_SITE_OTHER): Payer: Medicare Other | Admitting: Licensed Clinical Social Worker

## 2017-04-12 ENCOUNTER — Encounter (HOSPITAL_COMMUNITY): Payer: Self-pay | Admitting: Licensed Clinical Social Worker

## 2017-04-12 DIAGNOSIS — F3130 Bipolar disorder, current episode depressed, mild or moderate severity, unspecified: Secondary | ICD-10-CM | POA: Diagnosis not present

## 2017-04-12 NOTE — Progress Notes (Signed)
   THERAPIST PROGRESS NOTE  Session Time: 1:05 pm- 1:50 pm  Participation Level: Active  Behavioral Response: CasualAlertDepressed  Type of Therapy: Individual Therapy  Treatment Goals addressed: Coping  Interventions: CBT and Solution Focused  Summary: Victoria Mccarthy is a 71 y.o. female who presents oriented x5 (person, place, situation, time and object), distraced, depressed mood, neatly dressed, well groomed, and cooperative to address depression. Patient has a history of health issues that include shoulder injury, and lumbar spine fusion. She has a history of mental health treatment that includes outpatient therapy, medication management, and hospitalization. Patient denies symptoms of mania but reports her last manic episode to be 2 or 3 years prior. Patient denies current suicidal and homicidal thoughts. She denies psychosis including auditory and visual hallucinations but admits to experiencing delusions in the past when not medicated including thinking she had murdered someone and got away with it. Patient denies substance use. Patient is at low risk for lethality.   Patient had an average score of 2.25 out of 10 on the Outcome Rating Scale. Patient could not identify anything positive that has happened since the assessment. Patient shared her feelings about her mother, and her inability to grieve and cry over her death. Patient described several people in her life that she lost including her father, an uncle and an ex husband. She reported that she has not really cried since her ex husband passed away 15 years ago. Patient also noted that her home is very unclean and she has neglected it due to her lack of energy as well as lack of motivation. Patient identified a small task she could complete is washing the dishes. She also identified a small task related to cleaning her mother's room is removing her walker and her bedside toilet from her room. Patient identified this as a way she can  take "ownership" of the home (it was her mother's home). Patient committed to wash the dishes and remove two items (walker, bedside toilet) from her mother's room. Patient rated the session 10 out of 10 on the Session Rating Scale.  Patient engaged in session. She responded well to interventions. Patient continues to meet criteria for Bipolar I disorder, most recent episode depressed. Patient will continue in outpatient therapy due to being the least restrictive service to meet her needs. Patient made no progress on her goal at this time.  Suicidal/Homicidal: Negativewithout intent/plan  Therapist Response: Therapist reviewed patient's recent thoughts and behaviors. Therapist utilized CBT to address mood and depression. Therapist processed patient's feelings to identify trigger for mood. Therapist had patient identify small changes she could make to start getting her home in order and deal with her mother's passing. Therapist committed patient to wash the dishes and remove two items from her mother's room. Therapist administered the Outcome Rating Scale and the Session Rating Scale.   Plan: Return again in 3 weeks.   Therapist will review patient goals on or before 10.09.2018  Diagnosis: Axis I: Bipolar, Depressed    Axis II: No diagnosis    Glori Bickers, LCSW 04/12/2017

## 2017-04-18 ENCOUNTER — Encounter (HOSPITAL_COMMUNITY): Payer: Self-pay | Admitting: Psychiatry

## 2017-04-18 ENCOUNTER — Ambulatory Visit (INDEPENDENT_AMBULATORY_CARE_PROVIDER_SITE_OTHER): Payer: Medicare Other | Admitting: Psychiatry

## 2017-04-18 VITALS — BP 130/83 | HR 90 | Ht 65.0 in | Wt 184.0 lb

## 2017-04-18 DIAGNOSIS — F3130 Bipolar disorder, current episode depressed, mild or moderate severity, unspecified: Secondary | ICD-10-CM | POA: Diagnosis not present

## 2017-04-18 DIAGNOSIS — Z818 Family history of other mental and behavioral disorders: Secondary | ICD-10-CM | POA: Diagnosis not present

## 2017-04-18 MED ORDER — ALPRAZOLAM 1 MG PO TABS: 1.0000 mg | ORAL_TABLET | Freq: Four times a day (QID) | ORAL | 2 refills | Status: DC

## 2017-04-18 MED ORDER — BUPROPION HCL 75 MG PO TABS: 75.0000 mg | ORAL_TABLET | ORAL | 2 refills | Status: DC

## 2017-04-18 MED ORDER — FLUOXETINE HCL 40 MG PO CAPS: 40.0000 mg | ORAL_CAPSULE | Freq: Two times a day (BID) | ORAL | 2 refills | Status: DC

## 2017-04-18 MED ORDER — OLANZAPINE 20 MG PO TABS: 20.0000 mg | ORAL_TABLET | Freq: Every day | ORAL | 2 refills | Status: DC

## 2017-04-18 NOTE — Progress Notes (Signed)
Patient ID: Victoria Mccarthy, female   DOB: 1946-04-25, 71 y.o.   MRN: [id number] Patient ID: Victoria Mccarthy, female   DOB: 1945/10/01, 71 y.o.   MRN: [id number] Patient ID: Victoria Mccarthy, female   DOB: [birth year], 71 y.o.   MRN: [id number] Patient ID: Victoria Mccarthy, female   DOB: [birth year], 71 y.o.   MRN: [id number] Patient ID: Victoria Mccarthy, female   DOB: 06/18/1946, 71 y.o.   MRN: [id number] Patient ID: Victoria Mccarthy, female   DOB: [birth year], 71 y.o.   MRN: [id number] Patient ID: Victoria Mccarthy, female   DOB: 1945-10-15, 71 y.o.   MRN: [id number] Patient ID: Victoria Mccarthy, female   DOB: 10-Mar-1946, 71 y.o.   MRN: [id number]  Psychiatric Assessment Adult  Patient Identification:  Victoria Mccarthy Date of Evaluation:  04/18/2017 Chief Complaint: "I am still sad" History of Chief Complaint:   Chief Complaint  Patient presents with  . Depression  . Anxiety  . Manic Behavior  . Follow-up    Depression         Associated symptoms include decreased concentration and appetite change.  Past medical history includes anxiety.   Anxiety  Symptoms include decreased concentration and nervous/anxious behavior.     this patient is a 71 year old divorced white female who lives alone in [place]. She has one son, one daughter, 6 grandchildren 3 great-grandchildren. 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  The patient states that she's had problems with mental illness since her 71s. She was hospitalized at [facility] in [facility] twice in the 1980s. During these times she was depressed extremely anxious and having paranoid delusions that she had killed someone and that the police were after her. At one point she was hearing voices telling her to hurt her self. She finally started seeing Dr. Charmayne Sheer private practitioner in [place] who treated her for years. In the past she's been on Prozac, Symbyax, Xanax, Valium, BuSpar and lithium.  After her psychiatrist retired,  she began going to the [facility] but got tired of waiting for hours for recheck visit. She most recently she's been going to [place] [facility]. The psychiatrist there has cut down her medication. She used to be on Symbiax 6/50-2 a day and she was cut down to 4-25, once a day. Since this happened she become extremely anxious and depressed. At the beginning of this year however her insurance would not pay for the medicine so she's been off it entirely and she is getting even worse. The physician also cut her Xanax down from 1 mg 4 times a day to 0.5 mg twice a day and she is exceedingly anxious.  Currently the patient states that she is depressed all the time, very anxious and tearful, irritable and angry. She can't stand to be around people and stays by herself with her dogs. She's trying to spend some time sewing but it's very hard for her to concentrate. Her sleep is poor and her mind races. She is again having the delusion that she may have killed someone at the police may be coming to get her. She has nightmares about this. It's hard for her to sit still. She does not have any direct thoughts of hurting self or others. She constantly picks at her fingernails. She denies any current auditory or visual hallucinations  The patient returns after 4 weeks. She's doing slightly better. She is working with a Transport planner in our  office to set small goals to help start cleaning out her house and getting more active. She thinks adding the small bit of Wellbutrin has helped as well. She still is grieving her mom and is very focused on her mom and wondering why "she doesn't try to connect with me after death." I suggested she focus on living right now. She denies any thoughts of suicide or self-harm. She is sleeping well.  Review of Systems  Constitutional: Positive for activity change, appetite change and unexpected weight change.  HENT: Negative.   Eyes: Negative.   Respiratory: Negative.    Cardiovascular: Negative.   Gastrointestinal: Positive for abdominal pain.  Endocrine: Negative.   Genitourinary: Negative.   Musculoskeletal: Positive for arthralgias, back pain and neck pain.  Skin: Negative.   Allergic/Immunologic: Negative.   Hematological: Negative.   Psychiatric/Behavioral: Positive for agitation, decreased concentration, depression, dysphoric mood, hallucinations and sleep disturbance. The patient is nervous/anxious.    Physical Exam not done  Depressive Symptoms: depressed mood, anhedonia, insomnia, psychomotor retardation, fatigue, feelings of worthlessness/guilt, difficulty concentrating, hopelessness, anxiety, panic attacks, weight loss, decreased appetite,  (Hypo) Manic Symptoms:   Elevated Mood:  no Irritable Mood:  Yes Grandiosity:  No Distractibility:  Yes Labiality of Mood:  Yes Delusions:  Yes Hallucinations:  No Impulsivity:  No Sexually Inappropriate Behavior:  No Financial Extravagance:  No Flight of Ideas:  No  Anxiety Symptoms: Excessive Worry:  Yes Panic Symptoms:  Yes Agoraphobia:  Yes Obsessive Compulsive: Yes  Symptoms: Picks at nails Specific Phobias:  No Social Anxiety:  Yes  Psychotic Symptoms:  Hallucinations: No None Delusions:  Yes Paranoia:  Yes   Ideas of Reference:  No  PTSD Symptoms: Ever had a traumatic exposure:  No Had a traumatic exposure in the last month:  No Re-experiencing: No None Hypervigilance:  No Hyperarousal: No None Avoidance: No None  Traumatic Brain Injury: Yes Sports Related  Past Psychiatric History: Diagnosis: Bipolar disorder   Hospitalizations: Twice in the 1980s   Outpatient Care: At [facility], mental [place], with private practitioner   Substance Abuse Care: none  Self-Mutilation:none  Suicidal Attempts: none  Violent Behaviors: none   Past Medical History:   Past Medical History:  Diagnosis Date  . Anemia   . Anxiety   . Arthritis    chronic back  pain  . Bipolar disorder ([place])   . Cancer [facility]) Jan 2013   kidney cancer, left, s/p partial nephrectomy  . Depression   . Diarrhea    incontinent stools  . Diverticulitis    treated at least 5 times in past, hospitalized twice  . Heart murmur   . MVP (mitral valve prolapse)   . Neuropathy   . Nocturia   . Osteoarthritis   . Osteoporosis   . PONV (postoperative nausea and vomiting)   . Psychotic affective disorder ([place])    "psychotic delusions" "I cut myself"   . Restless leg syndrome   . Rheumatic fever   . Urinary frequency    History of Loss of Consciousness:  No Seizure History:  No Cardiac History:  No Allergies:   Allergies  Allergen Reactions  . Shellfish Allergy Anaphylaxis  . Neurontin [Gabapentin] Other (See Comments)    Unable to talk when takes medication  . Vicodin [Hydrocodone-Acetaminophen] Itching  . Codeine Itching     itching  . Haldol [Haloperidol Lactate] Other (See Comments)    Muscle spasms  . Hydromorphone Hcl Nausea And Vomiting  . Latex Rash  .  Propoxyphene N-Acetaminophen Nausea And Vomiting   Current Medications:  Current Outpatient Prescriptions  Medication Sig Dispense Refill  . ALPRAZolam (XANAX) 1 MG tablet Take 1 tablet (1 mg total) by mouth 4 (four) times daily. 120 tablet 2  . atorvastatin (LIPITOR) 10 MG tablet Take 10 mg by mouth at bedtime.  1  . buPROPion (WELLBUTRIN) 75 MG tablet Take 1 tablet (75 mg total) by mouth every morning. 30 tablet 2  . Cholecalciferol (VITAMIN D3) 50000 units CAPS Take 1 capsule by mouth once a week.  0  . cholestyramine (QUESTRAN) 4 GM/DOSE powder TAKE 1 SCOOPFUL (4 G TOTAL) BY MOUTH DAILY. DO NOT TAKE WITHIN 2 HOURS OF OTHER MEDICATIONS 378 g 5  . FLUoxetine (PROZAC) 40 MG capsule Take 1 capsule (40 mg total) by mouth 2 (two) times daily. 60 capsule 2  . methocarbamol (ROBAXIN) 500 MG tablet Take 1.5 tablets (750 mg total) by mouth every 6 (six) hours as needed. 60 tablet 1  . methocarbamol (ROBAXIN)  750 MG tablet Take 750 mg by mouth daily as needed for muscle spasms.  0  . Multiple Vitamin (MULTIVITAMIN WITH MINERALS) TABS Take 1 tablet by mouth every evening.    . nitroGLYCERIN (NITROSTAT) 0.4 MG SL tablet Place 0.4 mg under the tongue every 5 (five) minutes as needed for chest pain.    . NON FORMULARY Vitamin for Hair, Nails and Skin    . OLANZapine (ZYPREXA) 20 MG tablet Take 1 tablet (20 mg total) by mouth at bedtime. 30 tablet 2  . oxybutynin (DITROPAN XL) 15 MG 24 hr tablet Take 15 mg by mouth daily.  11  . rOPINIRole (REQUIP) 3 MG tablet TAKE 1 TABLET BY MOUTH 3 TIMES DAILY.  11  . traZODone (DESYREL) 50 MG tablet Take one or 2 at bedtime 60 tablet 2   No current facility-administered medications for this visit.     Previous Psychotropic Medications:  Medication Dose   Prozac, Symbyax, Xanax, Valium, BuSpar, lithium                        Substance Abuse History in the last 12 months: Substance Age of 1st Use Last Use Amount Specific Type  Nicotine      Alcohol      Cannabis      Opiates      Cocaine      Methamphetamines      LSD      Ecstasy      Benzodiazepines      Caffeine      Inhalants      Others:                          Medical Consequences of Substance Abuse: none  Legal Consequences of Substance Abuse: none  Family Consequences of Substance Abuse: none  Blackouts:  No DT's:  No Withdrawal Symptoms:  No None  Social History: Current Place of Residence: [place] of Birth: [place] Family Members: 2 children, several grandchildren Marital Status:  Divorced Children:   Sons: 1  Daughters: 1 Relationships: No friends Education:  Dentist Problems/Performance:  Religious Beliefs/Practices: none History of Abuse: none Pensions consultant; Artist, Catering manager History:  None. Legal History: none Hobbies/Interests: TV, sewing  Family History:   Family History   Problem Relation Age of Onset  . Bipolar disorder Daughter   . Colon cancer Neg Hx  Mental Status Examination/Evaluation: Objective:  Appearance: Neat and Well Groomed wearing lots of makeup and jewelry   Eye Contact::  Fair  Speech:  Clear and coherent   Volume:  Normal  Mood: Dysphoric   Affect A little brighter   Thought Process:  Goal Directed  Orientation:  Full (Time, Place, and Person)  Thought Content:  normal  Suicidal Thoughts:  No  Homicidal Thoughts:  No  Judgement:  Fair  Insight:  Fair  Psychomotor Activity:  Normal   Akathisia:  No  Handed:  Right  AIMS (if indicated):    Assets:  Communication Skills Desire for Improvement Resilience Social Support Talents/Skills    Laboratory/X-Ray Psychological Evaluation(s)   Labs reviewed in the chart are unremarkable      Assessment:  Axis I: Bipolar, mixed  AXIS I Bipolar, mixed  AXIS II Deferred  AXIS III Past Medical History:  Diagnosis Date  . Anemia   . Anxiety   . Arthritis    chronic back pain  . Bipolar disorder ([place])   . Cancer [facility]) Jan 2013   kidney cancer, left, s/p partial nephrectomy  . Depression   . Diarrhea    incontinent stools  . Diverticulitis    treated at least 5 times in past, hospitalized twice  . Heart murmur   . MVP (mitral valve prolapse)   . Neuropathy   . Nocturia   . Osteoarthritis   . Osteoporosis   . PONV (postoperative nausea and vomiting)   . Psychotic affective disorder ([place])    "psychotic delusions" "I cut myself"   . Restless leg syndrome   . Rheumatic fever   . Urinary frequency      AXIS IV other psychosocial or environmental problems  AXIS V 41-50 serious symptoms   Treatment Plan/Recommendations:  Plan of Care: Medication management   Laboratory:  As noted in history of present illness   Psychotherapy: She will continue therapy here    She will will continue Prozac 80 mg every morning for depression and continue Zyprexa 20 mg daily at bedtime  for her bipolar disorder and delusions. She will continue Xanax 1 mg 4 times a day for anxiety.We will Continue trazodone 50-100 mg at bedtime for sleep and  Wellbutrin 75 mg every morning for depression   Routine PRN Medications:  No  Consultations:  Safety Concerns:  She denies thoughts of hurting self or others   Other:  She'll return in 6 weeks     Levonne Spiller, MD 7/30/20182:04 PM

## 2017-05-03 ENCOUNTER — Ambulatory Visit (INDEPENDENT_AMBULATORY_CARE_PROVIDER_SITE_OTHER): Payer: Medicare Other | Admitting: Licensed Clinical Social Worker

## 2017-05-03 DIAGNOSIS — F3130 Bipolar disorder, current episode depressed, mild or moderate severity, unspecified: Secondary | ICD-10-CM | POA: Diagnosis not present

## 2017-05-03 NOTE — Progress Notes (Signed)
   THERAPIST PROGRESS NOTE  Session Time: 9:00 am- 10:00 am  Participation Level: Active  Behavioral Response: CasualAlertDepressed  Type of Therapy: Individual Therapy  Treatment Goals addressed: Coping  Interventions: CBT and Solution Focused  Summary: Victoria Mccarthy is a 71 y.o. female who presents oriented x5 (person, place, situation, time and object), distraced, depressed mood, neatly dressed, well groomed, and cooperative to address depression. Patient has a history of health issues that include shoulder injury, and lumbar spine fusion. She has a history of mental health treatment that includes outpatient therapy, medication management, and hospitalization. Patient denies symptoms of mania but reports her last manic episode to be 2 or 3 years prior. Patient denies current suicidal and homicidal thoughts. She denies psychosis including auditory and visual hallucinations but admits to experiencing delusions in the past when not medicated including thinking she had murdered someone and got away with it. Patient denies substance use. Patient is at low risk for lethality.   Patient had an average score of 2.75 out of 10 on the Outcome Rating Scale which is an increase of .50. Patient could not identify anything positive that has happened since the last session. Patient reported that she has completed some of her homework including washing the dishes and cleaning up additional areas of the home but didn't clean up any of her mother's room. Patient reported that she is having flooding and racing of thoughts. Patient continues to struggle with her mother's passing. Patient expressed her concern about her mother who is deceased not coming back to visit her as she promised. Patient feels like her mother is angry at her due to not taking care of the home and the dogs passing away. Patient noted that of the concerns she has, she could continue to take ownership of the home she is living in by cleaning  it up and moving furniture into the home. Patient committed to continue to clean the home and clean the floors up as well as take ownership of the home by moving furniture/items into the home. Patient rated the session 10 out of 10 on the Session Rating Scale.  Patient engaged in session. She responded well to interventions. Patient continues to meet criteria for Bipolar I disorder, most recent episode depressed. Patient will continue in outpatient therapy due to being the least restrictive service to meet her needs. Patient made minimal  progress on her goal at this time.   Suicidal/Homicidal: Negativewithout intent/plan  Therapist Response: Therapist reviewed patient's recent thoughts and behaviors. Therapist utilized CBT to address mood and depression. Therapist followed up on patient's homework. Therapist discussed patient's mood related to her mothers passing and the promise her mother made to her. Therapist had patient identify what she could control and make progress on. Therapist committed patient to take ownership of the home she is living in by cleaning the home including the floor, and bringing items into the home from her other home. Therapist administered the Outcome Rating Scale and the Session Rating Scale.   Plan: Return again in 1-2 weeks.   Therapist will review patient goals on or before 10.09.2018  Diagnosis: Axis I: Bipolar, Depressed    Axis II: No diagnosis    Glori Bickers, LCSW 05/03/2017

## 2017-05-06 ENCOUNTER — Other Ambulatory Visit: Payer: Self-pay | Admitting: Gastroenterology

## 2017-05-10 ENCOUNTER — Ambulatory Visit (INDEPENDENT_AMBULATORY_CARE_PROVIDER_SITE_OTHER): Payer: Medicare Other | Admitting: Licensed Clinical Social Worker

## 2017-05-10 DIAGNOSIS — F3130 Bipolar disorder, current episode depressed, mild or moderate severity, unspecified: Secondary | ICD-10-CM

## 2017-05-10 NOTE — Progress Notes (Signed)
THERAPIST PROGRESS NOTE  Session Time: 9:00 am- 10:00 am  Participation Level: Active  Behavioral Response: CasualAlertDepressed  Type of Therapy: Individual Therapy  Treatment Goals addressed: Coping  Interventions: CBT and Solution Focused  Summary: Victoria Mccarthy is a 71 y.o. female who presents oriented x5 (person, place, situation, time and object), distraced, depressed mood, neatly dressed, well groomed, and cooperative to address depression. Patient has a history of health issues that include shoulder injury, and lumbar spine fusion. She has a history of mental health treatment that includes outpatient therapy, medication management, and hospitalization. Patient denies symptoms of mania but reports her last manic episode to be 2 or 3 years prior. Patient denies current suicidal and homicidal thoughts. She denies psychosis including auditory and visual hallucinations but admits to experiencing delusions in the past when not medicated including thinking she had murdered someone and got away with it. Patient denies substance use. Patient is at low risk for lethality.   Patient had an average score of 2.75 out of 10 on the Outcome Rating Scale which is the same as the previous session. Patient explained that she didn't do her homework and did not have a productive week. She explained that she sat all day and then went to bed. Patient explained that she got notification that she was going to start to have to pay her insurance monthly which would be about $200 which would make it difficult for her. Patient explained the several financial strains she has as well as the things she needs to accomplish such as settling her mother's estate, getting land surveyed, getting a test on the land, etc. Patient felt overwhelmed by what she needs to accomplish which makes her feel bad, and because she feels bad she can't make progress on what she needs to accomplish which then in turn causes anxiety. After  discussion, patient remembered that she set a meeting up with the lawyer to find out what she needs to do to settle her mother's estate. After discussion, patient understood that is progress, she took a step to resolve one of her stressors. Patient understood that she has to address the immediate issues in order to get to some of the deeper issues such a grief. Patient committed to do one thing to take ownership of her home and continue to take small steps to resolve her stressors (finacial, etc). Patient rated the session 10 out of 10 on the Session Rating Scale.  Patient engaged in session. She responded well to interventions. Patient continues to meet criteria for Bipolar I disorder, most recent episode depressed. Patient will continue in outpatient therapy due to being the least restrictive service to meet her needs. Patient made minimal  progress on her goal at this time.   Suicidal/Homicidal: Negativewithout intent/plan  Therapist Response: Therapist reviewed patient's recent thoughts and behaviors. Therapist utilized CBT to address mood and depression. Therapist followed up on patient's homework. Therapist processed patient's feelings to identify triggers for mood. Therapist identified pattern that patient is stuck in. Therapist assisted patient in identifying steps to take to make progress on addressing her stressors. Therapist committed patient to take small steps (call to find how much it would be to get land surveyed, etc) to resolve her stressors and do one thing to take ownership of her home.Therapist administered the Outcome Rating Scale and the Session Rating Scale.   Plan: Return again in 1-2 weeks.   Therapist will review patient goals on or before 10.09.2018  Diagnosis: Axis I: Bipolar,  Depressed    Axis II: No diagnosis    Glori Bickers, LCSW 05/10/2017

## 2017-05-13 DIAGNOSIS — Z23 Encounter for immunization: Secondary | ICD-10-CM | POA: Diagnosis not present

## 2017-05-17 ENCOUNTER — Ambulatory Visit (HOSPITAL_COMMUNITY): Payer: Medicare Other | Admitting: Licensed Clinical Social Worker

## 2017-05-24 ENCOUNTER — Ambulatory Visit (INDEPENDENT_AMBULATORY_CARE_PROVIDER_SITE_OTHER): Payer: Medicare Other | Admitting: Licensed Clinical Social Worker

## 2017-05-24 DIAGNOSIS — F3130 Bipolar disorder, current episode depressed, mild or moderate severity, unspecified: Secondary | ICD-10-CM

## 2017-05-24 NOTE — Progress Notes (Signed)
   THERAPIST PROGRESS NOTE  Session Time: 2:00 pm- 2:40 pm  Participation Level: Active  Behavioral Response: CasualAlertDepressed  Type of Therapy: Individual Therapy  Treatment Goals addressed: Coping  Interventions: CBT and Solution Focused  Summary: Victoria Mccarthy is a 71 y.o. female who presents oriented x5 (person, place, situation, time and object), distraced, depressed mood, neatly dressed, well groomed, and cooperative to address depression. Patient has a history of health issues that include shoulder injury, and lumbar spine fusion. She has a history of mental health treatment that includes outpatient therapy, medication management, and hospitalization. Patient denies symptoms of mania but reports her last manic episode to be 2 or 3 years prior. Patient denies current suicidal and homicidal thoughts. She denies psychosis including auditory and visual hallucinations but admits to experiencing delusions in the past when not medicated including thinking she had murdered someone and got away with it. Patient denies substance use. Patient is at low risk for lethality.   Patient had an average score of 2.25 out of 10 on the Outcome Rating Scale which is a reduction of .50. Patient reported that she met with a lawyer and felt more hopeful about selling land and the land in her mother's estate. Patient discussed her plan when the land sells (repair her home including the foundation, roof and put down tile, pay off her car, pay off her insurance, and make repairs to her mother's home so she can rent it). After discussion, patient understood that she can only control what she can control. Patient reported that she has not done anything to clean her mother's home up due to lack of energy and lack of sleep. Patient reported that she fell the other week and bumped her head and neck. She reports feeling fuzzy. Patient reported that her mind doesn't stop when she tries to go to sleep due to worry.  Patient committed to acknowledge the progress she is making even thought it may be small, and continue to take small steps to resolve her stressors (finacial, home, etc). Patient rated the session 10 out of 10 on the Session Rating Scale.  Patient engaged in session. She responded well to interventions. Patient continues to meet criteria for Bipolar I disorder, most recent episode depressed. Patient will continue in outpatient therapy due to being the least restrictive service to meet her needs. Patient made minimal  progress on her goal at this time.   Suicidal/Homicidal: Negativewithout intent/plan  Therapist Response: Therapist reviewed patient's recent thoughts and behaviors. Therapist utilized CBT to address mood and depression. Therapist followed up on patient's homework. Therapist discussed control and how much control patient has in her life currently. Therapist encouraged patient to get checked out medically from her fall. Therapist committed patient to acknowledge the progress she is making even thought it may be small, and continue to take small steps to resolve her stressors (finacial, home, etc).Therapist administered the Outcome Rating Scale and the Session Rating Scale.   Plan: Return again in 1-2 weeks.   Therapist will review patient goals on or before 10.09.2018  Diagnosis: Axis I: Bipolar, Depressed    Axis II: No diagnosis    Glori Bickers, LCSW 05/24/2017

## 2017-05-25 ENCOUNTER — Ambulatory Visit (INDEPENDENT_AMBULATORY_CARE_PROVIDER_SITE_OTHER): Payer: Medicare Other | Admitting: Psychiatry

## 2017-05-25 ENCOUNTER — Encounter (HOSPITAL_COMMUNITY): Payer: Self-pay | Admitting: Psychiatry

## 2017-05-25 ENCOUNTER — Ambulatory Visit (HOSPITAL_COMMUNITY): Payer: Self-pay | Admitting: Psychiatry

## 2017-05-25 VITALS — BP 126/63 | HR 90 | Ht 65.0 in | Wt 181.2 lb

## 2017-05-25 DIAGNOSIS — R443 Hallucinations, unspecified: Secondary | ICD-10-CM | POA: Diagnosis not present

## 2017-05-25 DIAGNOSIS — M2550 Pain in unspecified joint: Secondary | ICD-10-CM

## 2017-05-25 DIAGNOSIS — R109 Unspecified abdominal pain: Secondary | ICD-10-CM

## 2017-05-25 DIAGNOSIS — M542 Cervicalgia: Secondary | ICD-10-CM | POA: Diagnosis not present

## 2017-05-25 DIAGNOSIS — Z818 Family history of other mental and behavioral disorders: Secondary | ICD-10-CM | POA: Diagnosis not present

## 2017-05-25 DIAGNOSIS — F3130 Bipolar disorder, current episode depressed, mild or moderate severity, unspecified: Secondary | ICD-10-CM

## 2017-05-25 DIAGNOSIS — M549 Dorsalgia, unspecified: Secondary | ICD-10-CM

## 2017-05-25 DIAGNOSIS — R451 Restlessness and agitation: Secondary | ICD-10-CM

## 2017-05-25 MED ORDER — BUPROPION HCL ER (XL) 150 MG PO TB24: 150.0000 mg | ORAL_TABLET | ORAL | 2 refills | Status: DC

## 2017-05-25 MED ORDER — OLANZAPINE 20 MG PO TABS: 20.0000 mg | ORAL_TABLET | Freq: Every day | ORAL | 2 refills | Status: DC

## 2017-05-25 MED ORDER — FLUOXETINE HCL 40 MG PO CAPS: 40.0000 mg | ORAL_CAPSULE | Freq: Two times a day (BID) | ORAL | 2 refills | Status: DC

## 2017-05-25 MED ORDER — TRAZODONE HCL 100 MG PO TABS: ORAL_TABLET | ORAL | 2 refills | Status: DC

## 2017-05-25 MED ORDER — ALPRAZOLAM 1 MG PO TABS: 1.0000 mg | ORAL_TABLET | Freq: Four times a day (QID) | ORAL | 2 refills | Status: DC

## 2017-05-25 NOTE — Progress Notes (Signed)
Patient ID: NATALEIGH GRIFFIN, female   DOB: 01/08/1946, 71 y.o.   MRN: [id number] Patient ID: SHENG PRITZ, female   DOB: 10/12/1945, 71 y.o.   MRN: [id number] Patient ID: SYNAI PRETTYMAN, female   DOB: 02/26/1946, 71 y.o.   MRN: [id number] Patient ID: ADESUWA OSGOOD, female   DOB: 1945/10/14, 71 y.o.   MRN: [id number] Patient ID: ARTAVIA JEANLOUIS, female   DOB: 11/14/1945, 71 y.o.   MRN: [id number] Patient ID: ARACELIE ADDIS, female   DOB: Mar 06, 1946, 71 y.o.   MRN: [id number] Patient ID: HERMAN MELL, female   DOB: 1946/05/23, 71 y.o.   MRN: [id number] Patient ID: BUFFEY ZABINSKI, female   DOB: 09-Apr-1946, 71 y.o.   MRN: [id number]  Psychiatric Assessment Adult  Patient Identification:  Victoria Mccarthy Chief Complaint: "I am still sad" History of Chief Complaint:   Chief Complaint  Patient presents with  . Depression  . Anxiety  . Follow-up  . Manic Behavior    Depression         Associated symptoms include decreased concentration and appetite change.  Past medical history includes anxiety.   Anxiety  Symptoms include decreased concentration and nervous/anxious behavior.     this patient is a 71 year old divorced white female who lives alone in [place]. She has one son, one daughter, 6 grandchildren 3 great-grandchildren. 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  The patient states that she's had problems with mental illness since her 12s. She was hospitalized at [facility] in [facility] twice in the 1980s. During these times she was depressed extremely anxious and having paranoid delusions that she had killed someone and that the police were after her. At one point she was hearing voices telling her to hurt her self. She finally started seeing Dr. Charmayne Sheer private practitioner in [place] who treated her for years. In the past she's been on Prozac, Symbyax, Xanax, Valium, BuSpar and lithium.  After her psychiatrist retired,  she began going to the [facility] but got tired of waiting for hours for recheck visit. She most recently she's been going to [place] [facility]. The psychiatrist there has cut down her medication. She used to be on Symbiax 6/50-2 a day and she was cut down to 4-25, once a day. Since this happened she become extremely anxious and depressed. At the beginning of this year however her insurance would not pay for the medicine so she's been off it entirely and she is getting even worse. The physician also cut her Xanax down from 1 mg 4 times a day to 0.5 mg twice a day and she is exceedingly anxious.  Currently the patient states that she is depressed all the time, very anxious and tearful, irritable and angry. She can't stand to be around people and stays by herself with her dogs. She's trying to spend some time sewing but it's very hard for her to concentrate. Her sleep is poor and her mind races. She is again having the delusion that she may have killed someone at the police may be coming to get her. She has nightmares about this. It's hard for her to sit still. She does not have any direct thoughts of hurting self or others. She constantly picks at her fingernails. She denies any current auditory or visual hallucinations  The patient returns after 4 weeks. She's doing slightly better. She is working with a Transport planner in our  office to set small goals . She has met with an attorney to open her mother's estate and get the ball rolling on selling some of the property. She still only sleeping about 5 hours a night. I suggested we again increase her trazodone 250 or 200 mg so she can get some rest. She's tired all the time and still feels down. we can also increase her Wellbutrin. She still not interacting much with people even her own family members. I suggested she work on this at least a little bit. She denies suicidal ideation Review of Systems  Constitutional: Positive for activity change, appetite  change and unexpected weight change.  HENT: Negative.   Eyes: Negative.   Respiratory: Negative.   Cardiovascular: Negative.   Gastrointestinal: Positive for abdominal pain.  Endocrine: Negative.   Genitourinary: Negative.   Musculoskeletal: Positive for arthralgias, back pain and neck pain.  Skin: Negative.   Allergic/Immunologic: Negative.   Hematological: Negative.   Psychiatric/Behavioral: Positive for agitation, decreased concentration, depression, dysphoric mood, hallucinations and sleep disturbance. The patient is nervous/anxious.    Physical Exam not done  Depressive Symptoms: depressed mood, anhedonia, insomnia, psychomotor retardation, fatigue, feelings of worthlessness/guilt, difficulty concentrating, hopelessness, anxiety, panic attacks, weight loss, decreased appetite,  (Hypo) Manic Symptoms:   Elevated Mood:  no Irritable Mood:  Yes Grandiosity:  No Distractibility:  Yes Labiality of Mood:  Yes Delusions:  Yes Hallucinations:  No Impulsivity:  No Sexually Inappropriate Behavior:  No Financial Extravagance:  No Flight of Ideas:  No  Anxiety Symptoms: Excessive Worry:  Yes Panic Symptoms:  Yes Agoraphobia:  Yes Obsessive Compulsive: Yes  Symptoms: Picks at nails Specific Phobias:  No Social Anxiety:  Yes  Psychotic Symptoms:  Hallucinations: No None Delusions:  Yes Paranoia:  Yes   Ideas of Reference:  No  PTSD Symptoms: Ever had a traumatic exposure:  No Had a traumatic exposure in the last month:  No Re-experiencing: No None Hypervigilance:  No Hyperarousal: No None Avoidance: No None  Traumatic Brain Injury: Yes Sports Related  Past Psychiatric History: Diagnosis: Bipolar disorder   Hospitalizations: Twice in the 1980s   Outpatient Care: At [facility], mental [place], with private practitioner   Substance Abuse Care: none  Self-Mutilation:none  Suicidal Attempts: none  Violent Behaviors: none   Past Medical  History:   Past Medical History:  Diagnosis Date  . Anemia   . Anxiety   . Arthritis    chronic back pain  . Bipolar disorder ([place])   . Cancer [facility]) Jan 2013   kidney cancer, left, s/p partial nephrectomy  . Depression   . Diarrhea    incontinent stools  . Diverticulitis    treated at least 5 times in past, hospitalized twice  . Heart murmur   . MVP (mitral valve prolapse)   . Neuropathy   . Nocturia   . Osteoarthritis   . Osteoporosis   . PONV (postoperative nausea and vomiting)   . Psychotic affective disorder ([place])    "psychotic delusions" "I cut myself"   . Restless leg syndrome   . Rheumatic fever   . Urinary frequency    History of Loss of Consciousness:  No Seizure History:  No Cardiac History:  No Allergies:   Allergies  Allergen Reactions  . Shellfish Allergy Anaphylaxis  . Neurontin [Gabapentin] Other (See Comments)    Unable to talk when takes medication  . Vicodin [Hydrocodone-Acetaminophen] Itching  . Codeine Itching     itching  . Haldol [  Haloperidol Lactate] Other (See Comments)    Muscle spasms  . Hydromorphone Hcl Nausea And Vomiting  . Latex Rash  . Propoxyphene N-Acetaminophen Nausea And Vomiting   Current Medications:  Current Outpatient Prescriptions  Medication Sig Dispense Refill  . ALPRAZolam (XANAX) 1 MG tablet Take 1 tablet (1 mg total) by mouth 4 (four) times daily. 120 tablet 2  . atorvastatin (LIPITOR) 10 MG tablet Take 10 mg by mouth at bedtime.  1  . Cholecalciferol (VITAMIN D3) 50000 units CAPS Take 1 capsule by mouth once a week.  0  . cholestyramine (QUESTRAN) 4 GM/DOSE powder TAKE 1 SCOOPFUL (4 G TOTAL) BY MOUTH DAILY. DO NOT TAKE WITHIN 2 HOURS OF OTHER MEDICATIONS 378 g 5  . FLUoxetine (PROZAC) 40 MG capsule Take 1 capsule (40 mg total) by mouth 2 (two) times daily. 60 capsule 2  . methocarbamol (ROBAXIN) 500 MG tablet Take 1.5 tablets (750 mg total) by mouth every 6 (six) hours as needed. 60 tablet 1  . methocarbamol  (ROBAXIN) 750 MG tablet Take 750 mg by mouth daily as needed for muscle spasms.  0  . Multiple Vitamin (MULTIVITAMIN WITH MINERALS) TABS Take 1 tablet by mouth every evening.    . nitroGLYCERIN (NITROSTAT) 0.4 MG SL tablet Place 0.4 mg under the tongue every 5 (five) minutes as needed for chest pain.    . NON FORMULARY Vitamin for Hair, Nails and Skin    . OLANZapine (ZYPREXA) 20 MG tablet Take 1 tablet (20 mg total) by mouth at bedtime. 30 tablet 2  . oxybutynin (DITROPAN XL) 15 MG 24 hr tablet Take 15 mg by mouth daily.  11  . rOPINIRole (REQUIP) 3 MG tablet TAKE 1 TABLET BY MOUTH 3 TIMES DAILY.  11  . buPROPion (WELLBUTRIN XL) 150 MG 24 hr tablet Take 1 tablet (150 mg total) by mouth every morning. 30 tablet 2  . traZODone (DESYREL) 100 MG tablet Take one or two at bedtime 60 tablet 2   No current facility-administered medications for this visit.     Previous Psychotropic Medications:  Medication Dose   Prozac, Symbyax, Xanax, Valium, BuSpar, lithium                        Substance Abuse History in the last 12 months: Substance Age of 1st Use Last Use Amount Specific Type  Nicotine      Alcohol      Cannabis      Opiates      Cocaine      Methamphetamines      LSD      Ecstasy      Benzodiazepines      Caffeine      Inhalants      Others:                          Medical Consequences of Substance Abuse: none  Legal Consequences of Substance Abuse: none  Family Consequences of Substance Abuse: none  Blackouts:  No DT's:  No Withdrawal Symptoms:  No None  Social History: Current Place of Residence: [place] of Birth: [place] Family Members: 2 children, several grandchildren Marital Status:  Divorced Children:   Sons: 1  Daughters: 1 Relationships: No friends Education:  Dentist Problems/Performance:  Religious Beliefs/Practices: none History of Abuse: none Pensions consultant; Artist, Corporate treasurer History:  None. Legal History: none Hobbies/Interests: TV, sewing  Family History:   Family History  Problem Relation Age of Onset  . Bipolar disorder Daughter   . Colon cancer Neg Hx     Mental Status Examination/Evaluation: Objective:  Appearance: Neat and Well Groomed wearing lots of makeup and jewelry   Eye Contact::  Fair  Speech:  Clear and coherent   Volume:  Normal  Mood: Dysphoric   Affect Tired   Thought Process:  Goal Directed  Orientation:  Full (Time, Place, and Person)  Thought Content:  normal  Suicidal Thoughts:  No  Homicidal Thoughts:  No  Judgement:  Fair  Insight:  Fair  Psychomotor Activity:  Normal   Akathisia:  No  Handed:  Right  AIMS (if indicated):    Assets:  Communication Skills Desire for Improvement Resilience Social Support Talents/Skills    Laboratory/X-Ray Psychological Evaluation(s)   Labs reviewed in the chart are unremarkable      Assessment:  Axis I: Bipolar, mixed  AXIS I Bipolar, mixed  AXIS II Deferred  AXIS III Past Medical History:  Diagnosis Date  . Anemia   . Anxiety   . Arthritis    chronic back pain  . Bipolar disorder ([place])   . Cancer [facility]) Jan 2013   kidney cancer, left, s/p partial nephrectomy  . Depression   . Diarrhea    incontinent stools  . Diverticulitis    treated at least 5 times in past, hospitalized twice  . Heart murmur   . MVP (mitral valve prolapse)   . Neuropathy   . Nocturia   . Osteoarthritis   . Osteoporosis   . PONV (postoperative nausea and vomiting)   . Psychotic affective disorder ([place])    "psychotic delusions" "I cut myself"   . Restless leg syndrome   . Rheumatic fever   . Urinary frequency      AXIS IV other psychosocial or environmental problems  AXIS V 41-50 serious symptoms   Treatment Plan/Recommendations:  Plan of Care: Medication management   Laboratory:  As noted in history of present illness   Psychotherapy: She will continue therapy here    Medications: She'll continue Prozac and Zyprexa and Wellbutrin for depression. She'll increase Wellbutrin to the Wellbutrin XL 150 mg every morning. He will increase trazodone to 150-200 mg at bedtime. She will continue Xanax 1 mg 4 times a day for anxiety     Consultations:  Safety Concerns:  She denies thoughts of hurting self or others   Other:  She'll return in 4 weeks     ROSS, Neoma Laming, MD 9/5/20182:50 PM

## 2017-05-31 ENCOUNTER — Ambulatory Visit (HOSPITAL_COMMUNITY): Payer: Medicare Other | Admitting: Licensed Clinical Social Worker

## 2017-06-01 DIAGNOSIS — G8929 Other chronic pain: Secondary | ICD-10-CM | POA: Diagnosis not present

## 2017-06-01 DIAGNOSIS — M25511 Pain in right shoulder: Secondary | ICD-10-CM | POA: Diagnosis not present

## 2017-06-01 DIAGNOSIS — M12811 Other specific arthropathies, not elsewhere classified, right shoulder: Secondary | ICD-10-CM | POA: Diagnosis not present

## 2017-06-01 DIAGNOSIS — M75101 Unspecified rotator cuff tear or rupture of right shoulder, not specified as traumatic: Secondary | ICD-10-CM | POA: Diagnosis not present

## 2017-06-01 DIAGNOSIS — Z96611 Presence of right artificial shoulder joint: Secondary | ICD-10-CM | POA: Diagnosis not present

## 2017-06-01 DIAGNOSIS — Z471 Aftercare following joint replacement surgery: Secondary | ICD-10-CM | POA: Diagnosis not present

## 2017-06-07 ENCOUNTER — Ambulatory Visit (INDEPENDENT_AMBULATORY_CARE_PROVIDER_SITE_OTHER): Payer: Medicare Other | Admitting: Licensed Clinical Social Worker

## 2017-06-07 DIAGNOSIS — F3130 Bipolar disorder, current episode depressed, mild or moderate severity, unspecified: Secondary | ICD-10-CM | POA: Diagnosis not present

## 2017-06-07 NOTE — Progress Notes (Signed)
   THERAPIST PROGRESS NOTE  Session Time: 2:00 pm- 2:40 pm  Participation Level: Active  Behavioral Response: CasualAlertDepressed  Type of Therapy: Individual Therapy  Treatment Goals addressed: Coping  Interventions: CBT and Solution Focused  Summary: Victoria Mccarthy is a 71 y.o. female who presents oriented x5 (person, place, situation, time and object), distraced, depressed mood, neatly dressed, well groomed, and cooperative to address depression. Patient has a history of health issues that include shoulder injury, and lumbar spine fusion. She has a history of mental health treatment that includes outpatient therapy, medication management, and hospitalization. Patient denies symptoms of mania but reports her last manic episode to be 2 or 3 years prior. Patient denies current suicidal and homicidal thoughts. She denies psychosis including auditory and visual hallucinations but admits to experiencing delusions in the past when not medicated including thinking she had murdered someone and got away with it. Patient denies substance use. Patient is at low risk for lethality.   Patient had an average score of 2.50 out of 10 on the Outcome Rating Scale which is an increase of .25. Patient continues to take small steps to resolve her stressors including talking to a lawyer to become the executor of her mother's estate so she can sell it and seeking medical treatment for physical pain. Patient noted that her balance has been off and she is going to the doctor about her back. She also noted that she is going to have surgery on her shoulder in Oct to reduce the pain. Patient has not been able to clean her home due to her physical pain. After discussion, patient expressed some hope for the future. She understands that the process of selling her land will take time but that it will happen and relieve a lot of her stressors. Patient committed to continue to manage her mood appropriately and take small steps  to move forward (sell the land, house, get help for after her surgery) . Patient rated the session 10 out of 10 on the Session Rating Scale.  Patient engaged in session. She responded well to interventions. Patient continues to meet criteria for Bipolar I disorder, most recent episode depressed. Patient will continue in outpatient therapy due to being the least restrictive service to meet her needs. Patient made minimal  progress on her goal at this time.   Suicidal/Homicidal: Negativewithout intent/plan  Therapist Response: Therapist reviewed patient's recent thoughts and behaviors. Therapist utilized CBT to address mood and depression. Therapist followed up on patient's homework. Therapist processed patient's feelings to identify triggers and identify steps to resolve them. Therapist discussed with patient managing her mood during this "limbo" time period she is currently in. Therapist committed patient to continue to manage her mood appropriately and take small steps to move forward (sell the land, house, get help for after her surgery)   .Therapist administered the Outcome Rating Scale and the Session Rating Scale.   Plan: Return again in 1-2 weeks.   Therapist will review patient goals on or before 10.09.2018  Diagnosis: Axis I: Bipolar, Depressed    Axis II: No diagnosis    Glori Bickers, LCSW 06/07/2017

## 2017-06-10 DIAGNOSIS — S0990XA Unspecified injury of head, initial encounter: Secondary | ICD-10-CM | POA: Diagnosis not present

## 2017-06-10 DIAGNOSIS — M542 Cervicalgia: Secondary | ICD-10-CM | POA: Diagnosis not present

## 2017-06-13 ENCOUNTER — Other Ambulatory Visit: Payer: Self-pay | Admitting: Neurological Surgery

## 2017-06-13 DIAGNOSIS — S0990XA Unspecified injury of head, initial encounter: Secondary | ICD-10-CM

## 2017-06-13 DIAGNOSIS — M542 Cervicalgia: Secondary | ICD-10-CM

## 2017-06-14 ENCOUNTER — Ambulatory Visit (INDEPENDENT_AMBULATORY_CARE_PROVIDER_SITE_OTHER): Payer: Medicare Other | Admitting: Licensed Clinical Social Worker

## 2017-06-14 DIAGNOSIS — F3130 Bipolar disorder, current episode depressed, mild or moderate severity, unspecified: Secondary | ICD-10-CM | POA: Diagnosis not present

## 2017-06-14 NOTE — Progress Notes (Signed)
   THERAPIST PROGRESS NOTE  Session Time: 2:00 pm- 2:40 pm  Participation Level: Active  Behavioral Response: CasualAlertDepressed  Type of Therapy: Individual Therapy  Treatment Goals addressed: Coping  Interventions: CBT and Solution Focused  Summary: Victoria Mccarthy is a 71 y.o. female who presents oriented x5 (person, place, situation, time and object), distraced, depressed mood, neatly dressed, well groomed, and cooperative to address depression. Patient has a history of health issues that include shoulder injury, and lumbar spine fusion. She has a history of mental health treatment that includes outpatient therapy, medication management, and hospitalization. Patient denies symptoms of mania but reports her last manic episode to be 2 or 3 years prior. Patient denies current suicidal and homicidal thoughts. She denies psychosis including auditory and visual hallucinations but admits to experiencing delusions in the past when not medicated including thinking she had murdered someone and got away with it. Patient denies substance use. Patient is at low risk for lethality.   Patient had an average score of 2.50 out of 10 on the Outcome Rating Scale which is the same as the last session. Patient reported that she is feeling more hopeful and thinking more about the future. Patient reported that she has taken more steps to work on getting the land sold and she has gone to the doctor to get her neck looked at then she is going to have her shoulder surgery. After discussion, patient understood that she is using her problem solving skills more than she has in the last few months rather than feel overwhelmed by decisions and not making any. Patient committed to continue to be hopeful, use future oriented thinking and problem solve to reduce symptoms of depression. Patient rated the session 10 out of 10 on the Session Rating Scale.  Patient engaged in session. She responded well to interventions. Patient  continues to meet criteria for Bipolar I disorder, most recent episode depressed. Patient will continue in outpatient therapy due to being the least restrictive service to meet her needs. Patient made minimal  progress on her goal at this time.   Suicidal/Homicidal: Negativewithout intent/plan  Therapist Response: Therapist reviewed patient's recent thoughts and behaviors. Therapist utilized CBT to address mood and depression. Therapist discussed patient's progress. Therapist processed patient's feelings to identify triggers for mood. Therapist discussed patient's ability to think clearer to solve problems much better than before. Therapist committed patient to continue to be hopeful, use future oriented thinking and problem solve to reduce symptoms of depression. Therapist administered the Outcome Rating Scale and the Session Rating Scale.   Plan: Return again in 1-2 weeks.   Therapist will review patient goals on or before 10.09.2018  Diagnosis: Axis I: Bipolar, Depressed    Axis II: No diagnosis    Glori Bickers, LCSW 06/14/2017

## 2017-06-17 ENCOUNTER — Ambulatory Visit
Admission: RE | Admit: 2017-06-17 | Discharge: 2017-06-17 | Disposition: A | Discharge status: Home / Self Care | Payer: Medicare Other | Source: Ambulatory Visit | Attending: Neurological Surgery | Admitting: Neurological Surgery

## 2017-06-17 DIAGNOSIS — S0990XA Unspecified injury of head, initial encounter: Secondary | ICD-10-CM

## 2017-06-17 DIAGNOSIS — M542 Cervicalgia: Secondary | ICD-10-CM | POA: Diagnosis not present

## 2017-06-17 DIAGNOSIS — R42 Dizziness and giddiness: Secondary | ICD-10-CM | POA: Diagnosis not present

## 2017-06-22 ENCOUNTER — Ambulatory Visit (HOSPITAL_COMMUNITY): Payer: Self-pay | Admitting: Psychiatry

## 2017-06-28 DIAGNOSIS — M542 Cervicalgia: Secondary | ICD-10-CM | POA: Diagnosis not present

## 2017-06-28 DIAGNOSIS — Z6829 Body mass index (BMI) 29.0-29.9, adult: Secondary | ICD-10-CM | POA: Diagnosis not present

## 2017-06-28 DIAGNOSIS — I10 Essential (primary) hypertension: Secondary | ICD-10-CM | POA: Diagnosis not present

## 2017-07-04 ENCOUNTER — Ambulatory Visit (HOSPITAL_COMMUNITY): Payer: Self-pay | Admitting: Licensed Clinical Social Worker

## 2017-07-05 ENCOUNTER — Ambulatory Visit (HOSPITAL_COMMUNITY): Payer: Self-pay | Admitting: Psychiatry

## 2017-07-06 ENCOUNTER — Ambulatory Visit (HOSPITAL_COMMUNITY): Payer: Medicare Other | Attending: Neurological Surgery | Admitting: Physical Therapy

## 2017-07-06 DIAGNOSIS — M25612 Stiffness of left shoulder, not elsewhere classified: Secondary | ICD-10-CM | POA: Insufficient documentation

## 2017-07-06 DIAGNOSIS — M5412 Radiculopathy, cervical region: Secondary | ICD-10-CM | POA: Diagnosis not present

## 2017-07-06 NOTE — Therapy (Signed)
[facility] [address] [place], [place], [address] Phone: [phone]   Fax:  [phone]  Physical Therapy Evaluation  Patient Details  Name: Victoria Mccarthy MRN: [id number] Date of Birth: [birth year] Referring Provider: Sherley Mccarthy  Encounter Date: 07/06/2017      PT End of Session - 07/06/17 1406    Visit Number 1   Number of Visits 12   Date for PT Re-Evaluation 08/05/17   Authorization Type medicare/ medicaid   Authorization - Visit Number 1   Authorization - Number of Visits 12   PT Start Time 1300   PT Stop Time 1350   PT Time Calculation (min) 50 min   Activity Tolerance Patient tolerated treatment well   Behavior During Therapy [facility] for tasks assessed/performed      Past Medical History:  Diagnosis Date  . Anemia   . Anxiety   . Arthritis    chronic back pain  . Bipolar disorder ([place])   . Cancer [facility]) Jan 2013   kidney cancer, left, s/p partial nephrectomy  . Depression   . Diarrhea    incontinent stools  . Diverticulitis    treated at least 5 times in past, hospitalized twice  . Heart murmur   . MVP (mitral valve prolapse)   . Neuropathy   . Nocturia   . Osteoarthritis   . Osteoporosis   . PONV (postoperative nausea and vomiting)   . Psychotic affective disorder ([place])    "psychotic delusions" "I cut myself"   . Restless leg syndrome   . Rheumatic fever   . Urinary frequency     Past Surgical History:  Procedure Laterality Date  . ABDOMINAL HYSTERECTOMY    . BACK SURGERY    . BACK SURGERY     2 plates, 8 screws in back  . carpell tunnell     rt wrist  x2  . CATARACT EXTRACTION W/PHACO Left 06/14/2016   Procedure: CATARACT EXTRACTION PHACO AND INTRAOCULAR LENS PLACEMENT; CDE:  3.92;  Surgeon: Williams Che, MD;  Location: [facility];  Service: Ophthalmology;  Laterality: Left;  . CHOLECYSTECTOMY    . COLONOSCOPY  5/02   pancolonic deverticula, internal hemorrhoids  . COLONOSCOPY  1/11   single  external hemorrhoidal tag and anal papilla otherwise normal rectum, pancolonic diverticula, s/p sigmoid biopsy and stool sampling all unremarkable  . ESOPHAGOGASTRODUODENOSCOPY  04/06/2010   intact Nissen fundoplication S/P dilation, somewhat baggy atonic appearing esophagus, 58-F dilation  . ESOPHAGOGASTRODUODENOSCOPY  1/11   nomral esophagus s/p 54-F Maloney dilation, normal/intact Nissen fundoplication, diffuse patchy erythema and erosions likely NSAID/ASA effect with benign biopsy  . EYE SURGERY     right eye cataract  . kidney surgery for cancer    . MAXIMUM ACCESS (MAS)POSTERIOR LUMBAR INTERBODY FUSION (PLIF) 1 LEVEL N/A 07/25/2014   Procedure: FOR MAXIMUM ACCESS (MAS) POSTERIOR LUMBAR INTERBODY FUSION (PLIF) Lumbar two/three, Removal of hardware Lumbar three/four;  Surgeon: Eustace Moore, MD;  Location: [facility];  Service: Neurosurgery;  Laterality: N/A;  . MAXIMUM ACCESS (MAS)POSTERIOR LUMBAR INTERBODY FUSION (PLIF) 2 LEVEL N/A 09/11/2015   Procedure: LUMBAR FOUR-FIVE LUMBAR FIVE SACRAL ONE  MAXIMUM ACCESS SURGERY, POSTERIOR LUMBAR INTERBODY FUSION , Removal of LUMBAR TWO TO LUMBAR FOUR  HARDWARE;  Surgeon: Eustace Moore, MD;  Location: [facility];  Service: Neurosurgery;  Laterality: N/A;  . NISSEN FUNDOPLICATION    . REVERSE SHOULDER ARTHROPLASTY Right 05/28/2016   Procedure: RIGHT REVERSE SHOULDER ARTHROPLASTY;  Surgeon: Richardson Landry  Veverly Fells, MD;  Location: [facility];  Service: Orthopedics;  Laterality: Right;  . ROBOT ASSISTED LAPAROSCOPIC NEPHRECTOMY  08/23/2011   Procedure: ROBOTIC ASSISTED LAPAROSCOPIC NEPHRECTOMY;  Surgeon: Dutch Gray, MD;  Location: [facility];  Service: Urology;  Laterality: Left;  Left Robotic Assisted  Laparoscopic Partial Nephrectomy   . ROTATOR CUFF REPAIR     right side X 2  . TONSILLECTOMY      There were no vitals filed for this visit.       Subjective Assessment - 07/06/17 1302    Subjective [name] states that she slipped in some water about a month ago and  hit the back of her neck.  She is now having pain in the lower neck which goes into her shoulder.   She has fallen twice since the initial fall and feels that her sense of balance has been off since her fall.  Her MD placed her on an antiinflammatory and therapy.  She has been using the medication but does not see any benefit thus far.     Pertinent History four back surgeries; 4 surgeries on her shoulder    How long can you sit comfortably? no change   How long can you stand comfortably? no change   How long can you walk comfortably? no change   Patient Stated Goals less pain; to be able to turn her head easier.  To complete housework without pain    Currently in Pain? Yes   Pain Score 5   goes as high as a 8-9/10    Pain Location Neck   Pain Orientation Left;Lower   Pain Descriptors / Indicators Aching   Pain Type Chronic pain   Pain Radiating Towards Lt shoulder   Pain Onset More than a month ago   Pain Frequency Constant   Aggravating Factors  turning her head    Pain Relieving Factors not sure    Effect of Pain on Daily Activities driving             [facility] PT Assessment - 07/06/17 0001      Assessment   Medical Diagnosis cervical pain   Referring Provider Victoria Mccarthy   Onset Date/Surgical Date 06/05/17   Next MD Visit not scheduled   Prior Therapy none     Precautions   Precautions None     Restrictions   Weight Bearing Restrictions No     Balance Screen   Has the patient fallen in the past 6 months Yes   How many times? 3   Has the patient had a decrease in activity level because of a fear of falling?  Yes   Is the patient reluctant to leave their home because of a fear of falling?  No     Prior Function   Level of Independence Independent   Vocation Retired   Leisure sew      Cognition   Overall Cognitive Status Within Functional Limits for tasks assessed     Observation/Other Assessments   Focus on Therapeutic Outcomes (FOTO)  35     Posture/Postural  Control   Posture/Postural Control Postural limitations   Postural Limitations Rounded Shoulders;Forward head;Increased thoracic kyphosis     ROM / Strength   AROM / PROM / Strength AROM;Strength     AROM   AROM Assessment Site Cervical;Shoulder   Right/Left Shoulder Left   Left Shoulder Flexion 85 Degrees   Left Shoulder ABduction 90 Degrees   Left Shoulder Internal Rotation 70 Degrees  Left Shoulder External Rotation 60 Degrees   Cervical Flexion 40   Cervical Extension 30  reps causes radicular symptoms.    Cervical - Right Side Bend 13   Cervical - Left Side Bend 12  reps increase pain    Cervical - Right Rotation 40   Cervical - Left Rotation 25     Strength   Strength Assessment Site Cervical;Shoulder   Right/Left Shoulder Left   Left Shoulder Flexion 3-/5   Left Shoulder ABduction 3-/5   Left Shoulder Internal Rotation 3-/5   Left Shoulder External Rotation 3-/5   Cervical Extension 3-/5   Cervical - Right Side Bend 3/5   Cervical - Left Side Bend 3-/5            Objective measurements completed on examination: See above findings.          [facility] Adult PT Treatment/Exercise - 07/06/17 0001      Exercises   Exercises Shoulder     Shoulder Exercises: Supine   External Rotation Weight (lbs) 10   Internal Rotation 10 reps   Flexion Left;5 reps   ABduction Left;5 reps   Other Supine Exercises cervical and scapular retraction x 5 each; cervical rotation x 5 each                 PT Education - 07/06/17 1343    Education provided Yes   Education Details good posture    Person(s) Educated Patient   Methods Explanation   Comprehension Verbalized understanding          PT Short Term Goals - 07/06/17 1421      PT SHORT TERM GOAL #1   Title Pt cervical rotation to increase 10 degrees both to the right and left for improved safety of driving    Time 3   Period Weeks   Status New     PT SHORT TERM GOAL #2   Title Pt to be able to  single leg stance on both LE for at least 10 seconds to reduce risk of falls    Time 3   Period Weeks   Status New     PT SHORT TERM GOAL #3   Title Pt pain to greater than a 5/10 to allow pt to complete personal care activites with greater ease.    Time 3   Period Weeks   Status New     PT SHORT TERM GOAL #4   Title Pt to be able to lift 2 pounds using her left arm  from thigh to shoulder height.    Time 3   Period Weeks           PT Long Term Goals - 07/06/17 1418      PT LONG TERM GOAL #1   Title Pt to increase cervical rotation by 20 degrees in both direction to be able to see her blind spot   Time 6   Period Weeks   Status New   Target Date 08/17/17     PT LONG TERM GOAL #2   Title Pt to be able to single leg stance for 20 seconds so that pt is able to state that she has not fallen in the past three weeks   Time 6   Period Weeks   Status New     PT LONG TERM GOAL #3   Title Pt ROM and strength in her left arm to be increased to be able to lift 4 pounds from waist to  shoulder height to be able to put items away in higher cabinets.    Time 6   Period Weeks   Status New     PT LONG TERM GOAL #4   Title Pt pain in her neck to be no greater than a 2/10 to allow pt to complete 30 mintues of continuous housework without increased pain.    Time 6   Period Weeks   Status New                Plan - 07/29/2017 1407    Clinical Impression Statement Ms. Corona is a 71 yo female who fell backward and hit her head.  She has been having increased cervical and left shoulder pain as well as decreased balance ever since.  She is currently being referred to skilled physical therapy.  Examination demonstrates decreased cervical and shoulder ROM and strength , increased pain and decrease balance.  Ms. Stallman will benefit from skilled physcical therapy to address these issues and maximize her functional ability.    History and Personal Factors relevant to plan of care: 4 back  surgeries; 4 Rt shoulder surgeries,    Clinical Presentation Stable   Clinical Decision Making Moderate   Rehab Potential Good   PT Frequency 2x / week   PT Duration 6 weeks   PT Treatment/Interventions ADLs/Self Care Home Management;Therapeutic exercise;Balance training;Neuromuscular re-education;Patient/family education;Manual techniques   PT Next Visit Plan Begin single leg stance; isometric for cervical SB, extension and Lt shoulder, manual for cervical area.  Progress shoulder and cervical ROM and strengthening.    PT Home Exercise Plan eval:  Supine:  cervical and scapular retraction, Lt shoulder ROM; cervical rotation    Consulted and Agree with Plan of Care Patient      Patient will benefit from skilled therapeutic intervention in order to improve the following deficits and impairments:  Decreased activity tolerance, Decreased balance  Visit Diagnosis: Radiculopathy, cervical region  Stiffness of left shoulder, not elsewhere classified      G-Codes - 07/29/17 1425    Functional Assessment Tool Used (Outpatient Only) foto   Functional Limitation Carrying, moving and handling objects   Carrying, Moving and Handling Objects Current Status (S2831) At least 60 percent but less than 80 percent impaired, limited or restricted   Carrying, Moving and Handling Objects Goal Status (D1761) At least 40 percent but less than 60 percent impaired, limited or restricted       Problem List Patient Active Problem List   Diagnosis Date Noted  . S/P shoulder replacement 05/28/2016  . Weight gain 01/28/2016  . Flatulence 01/28/2016  . S/P lumbar spinal fusion 07/25/2014  . Pain in joint, shoulder region 11/22/2013  . Decreased range of motion of right shoulder 11/22/2013  . Muscle tightness 11/22/2013  . Muscle weakness (generalized) 11/22/2013  . Low back pain 04/28/2011  . Post laminectomy syndrome 04/28/2011  . DYSPHAGIA 09/26/2009  . DIARRHEA 09/26/2009  . FULL INCONTINENCE OF  FECES 09/26/2009    Rayetta Humphrey, PT CLT [phone] July 29, 2017, 2:28 PM  [facility] [address] [place], [place], [address] Phone: [phone]   Fax:  [phone]  Name: Victoria Mccarthy MRN: [id number] Date of Birth: 03/01/1946

## 2017-07-06 NOTE — Patient Instructions (Addendum)
ROM: Flexion (Standing)   Lying on your bed.   raise your left arm as high as possible without pain. Keep thumb leading the way Repeat 5-10____ times per set. Do _1___ sets per session. Do _2___ sessions per day.  http://orth.exer.us/908   Copyright  VHI. All rights reserved.  ROM: Abduction (Standing)    Bring left  arms straight out from sides and raise as high as possible without pain. Repeat 5-10___ times per set. Do __1__ sets per session. Do ___3_ sessions per day.  http://orth.exer.us/910   Copyright  VHI. All rights reserved.  ROM: External / Internal Rotation - in Abduction (Standing)    With upper arms and elbows bent at , gently rotate arm back toward the bed then back towards your hip. Repeat 5-10____ times per set. Do 1____ sets per session. Do _3-4___ sessions per day.  http://orth.exer.us/912   Copyright  VHI. All rights reserved.  AROM: Neck Rotation    Turn head slowly to look over one shoulder, then the other. Hold each position __3-5__ seconds. Repeat _10___ times per set. Do __1__ sets per session. Do _2___ sessions per day.  http://orth.exer.us/294   Copyright  VHI. All rights reserved.

## 2017-07-12 ENCOUNTER — Ambulatory Visit (HOSPITAL_COMMUNITY): Payer: Medicare Other | Admitting: Physical Therapy

## 2017-07-12 ENCOUNTER — Telehealth (HOSPITAL_COMMUNITY): Payer: Self-pay | Admitting: Physical Therapy

## 2017-07-12 NOTE — Telephone Encounter (Signed)
She has car trouble and can not get here

## 2017-07-14 ENCOUNTER — Ambulatory Visit (HOSPITAL_COMMUNITY): Payer: Medicare Other

## 2017-07-14 ENCOUNTER — Encounter (HOSPITAL_COMMUNITY): Payer: Self-pay

## 2017-07-14 ENCOUNTER — Ambulatory Visit (HOSPITAL_COMMUNITY): Payer: Medicare Other | Admitting: Physical Therapy

## 2017-07-14 ENCOUNTER — Telehealth (HOSPITAL_COMMUNITY): Payer: Self-pay | Admitting: Physical Therapy

## 2017-07-14 DIAGNOSIS — M5412 Radiculopathy, cervical region: Secondary | ICD-10-CM | POA: Diagnosis not present

## 2017-07-14 DIAGNOSIS — M25612 Stiffness of left shoulder, not elsewhere classified: Secondary | ICD-10-CM

## 2017-07-14 NOTE — Telephone Encounter (Signed)
Pt called re missed appointment.  No answer.  Left message re: next appointment is at 1:45 pm on the 30th.    Rayetta Humphrey, [place] CLT [phone]

## 2017-07-14 NOTE — Therapy (Signed)
[facility] [place], [place], [address] Phone: [phone]   Fax:  [phone]  Physical Therapy Treatment  Patient Details  Name: Victoria Mccarthy MRN: [id number] Date of Birth: [birth year] Referring Provider: Sherley Bounds  Encounter Date: 07/14/2017      PT End of Session - 07/14/17 1525    Visit Number 2   Number of Visits 12   Date for PT Re-Evaluation 08/05/17   Authorization Type medicare/caid   Authorization - Visit Number 2   Authorization - Number of Visits 10   PT Start Time 4196   PT Stop Time 2229   PT Time Calculation (min) 41 min   Activity Tolerance Patient tolerated treatment well;No increased pain   Behavior During Therapy WFL for tasks assessed/performed      Past Medical History:  Diagnosis Date  . Anemia   . Anxiety   . Arthritis    chronic back pain  . Bipolar disorder ([place])   . Cancer [facility]) Jan 2013   kidney cancer, left, s/p partial nephrectomy  . Depression   . Diarrhea    incontinent stools  . Diverticulitis    treated at least 5 times in past, hospitalized twice  . Heart murmur   . MVP (mitral valve prolapse)   . Neuropathy   . Nocturia   . Osteoarthritis   . Osteoporosis   . PONV (postoperative nausea and vomiting)   . Psychotic affective disorder ([place])    "psychotic delusions" "I cut myself"   . Restless leg syndrome   . Rheumatic fever   . Urinary frequency     Past Surgical History:  Procedure Laterality Date  . ABDOMINAL HYSTERECTOMY    . BACK SURGERY    . BACK SURGERY     2 plates, 8 screws in back  . carpell tunnell     rt wrist  x2  . CATARACT EXTRACTION W/PHACO Left 06/14/2016   Procedure: CATARACT EXTRACTION PHACO AND INTRAOCULAR LENS PLACEMENT; CDE:  3.92;  Surgeon: Williams Che, MD;  Location: [facility];  Service: Ophthalmology;  Laterality: Left;  . CHOLECYSTECTOMY    . COLONOSCOPY  5/02   pancolonic deverticula, internal hemorrhoids  . COLONOSCOPY  1/11    single external hemorrhoidal tag and anal papilla otherwise normal rectum, pancolonic diverticula, s/p sigmoid biopsy and stool sampling all unremarkable  . ESOPHAGOGASTRODUODENOSCOPY  04/06/2010   intact Nissen fundoplication S/P dilation, somewhat baggy atonic appearing esophagus, 58-F dilation  . ESOPHAGOGASTRODUODENOSCOPY  1/11   nomral esophagus s/p 54-F Maloney dilation, normal/intact Nissen fundoplication, diffuse patchy erythema and erosions likely NSAID/ASA effect with benign biopsy  . EYE SURGERY     right eye cataract  . kidney surgery for cancer    . MAXIMUM ACCESS (MAS)POSTERIOR LUMBAR INTERBODY FUSION (PLIF) 1 LEVEL N/A 07/25/2014   Procedure: FOR MAXIMUM ACCESS (MAS) POSTERIOR LUMBAR INTERBODY FUSION (PLIF) Lumbar two/three, Removal of hardware Lumbar three/four;  Surgeon: Eustace Moore, MD;  Location: [facility];  Service: Neurosurgery;  Laterality: N/A;  . MAXIMUM ACCESS (MAS)POSTERIOR LUMBAR INTERBODY FUSION (PLIF) 2 LEVEL N/A 09/11/2015   Procedure: LUMBAR FOUR-FIVE LUMBAR FIVE SACRAL ONE  MAXIMUM ACCESS SURGERY, POSTERIOR LUMBAR INTERBODY FUSION , Removal of LUMBAR TWO TO LUMBAR FOUR  HARDWARE;  Surgeon: Eustace Moore, MD;  Location: [facility];  Service: Neurosurgery;  Laterality: N/A;  . NISSEN FUNDOPLICATION    . REVERSE SHOULDER ARTHROPLASTY Right 05/28/2016   Procedure: RIGHT REVERSE SHOULDER ARTHROPLASTY;  Surgeon:  Netta Cedars, MD;  Location: [facility];  Service: Orthopedics;  Laterality: Right;  . ROBOT ASSISTED LAPAROSCOPIC NEPHRECTOMY  08/23/2011   Procedure: ROBOTIC ASSISTED LAPAROSCOPIC NEPHRECTOMY;  Surgeon: Dutch Gray, MD;  [facility];  Service: Urology;  Laterality: Left;  Left Robotic Assisted  Laparoscopic Partial Nephrectomy   . ROTATOR CUFF REPAIR     right side X 2  . TONSILLECTOMY      There were no vitals filed for this visit.      Subjective Assessment - 07/14/17 1524    Subjective Pt stated her LOB is off today, no reports of recent falls.   Reports neck pain scale 6/10 Lt side.   Pertinent History four back surgeries; 4 surgeries on her shoulder    Patient Stated Goals less pain; to be able to turn her head easier.  To complete housework without pain    Currently in Pain? Yes   Pain Score 6    Pain Location Neck   Pain Orientation Left   Pain Descriptors / Indicators Aching;Sharp  sharp pain with rotation to Rt   Pain Type Chronic pain   Pain Onset More than a month ago   Pain Frequency Constant   Aggravating Factors  turning her head   Pain Relieving Factors not sure   Effect of Pain on Daily Activities driving                         [facility] Adult PT Treatment/Exercise - 07/14/17 0001      Bed Mobility   Bed Mobility Sit to Sidelying Left   Sit to Sidelying Left 5: Supervision   Sit to Sidelying Left Details (indicate cue type and reason) Reviewed proper bed mechanics, instructed log roll     Posture/Postural Control   Posture/Postural Control Postural limitations   Postural Limitations Rounded Shoulders;Forward head;Increased thoracic kyphosis     Shoulder Exercises: Supine   External Rotation 10 reps   Internal Rotation 10 reps   Flexion 10 reps   ABduction Left;5 reps   Other Supine Exercises cervical and scapular retraction x 10 each; cervical rotation x 10 each      Shoulder Exercises: Seated   Other Seated Exercises isometric for extension, rotation and SB 5x 3"     Manual Therapy   Manual Therapy Soft tissue mobilization   Manual therapy comments Manual complete separate rest of tx   Soft tissue mobilization supine position with LE elevated, massage to upper trap, levator, scalenes, reports of relief with manual traction 2x 1'             Balance Exercises - 07/14/17 1604      Balance Exercises: Standing   Tandem Stance Eyes open;Foam/compliant surface;2 reps;30 secs   SLS 3 reps  Lt 11", Rt 14" max of 3           PT Education - 07/14/17 1616    Education provided  Yes   Education Details Reviewed goals, assured compliance/technique with HEP, copy of eval given to pt.  Reviewed importance of posture and safety mechanics with AD for balance   Person(s) Educated Patient   Methods Explanation;Demonstration;Handout   Comprehension Verbalized understanding;Returned demonstration;Need further instruction          PT Short Term Goals - 07/06/17 1421      PT SHORT TERM GOAL #1   Title Pt cervical rotation to increase 10 degrees both to the right and left for improved safety of  driving    Time 3   Period Weeks   Status New     PT SHORT TERM GOAL #2   Title Pt to be able to single leg stance on both LE for at least 10 seconds to reduce risk of falls    Time 3   Period Weeks   Status New     PT SHORT TERM GOAL #3   Title Pt pain to greater than a 5/10 to allow pt to complete personal care activites with greater ease.    Time 3   Period Weeks   Status New     PT SHORT TERM GOAL #4   Title Pt to be able to lift 2 pounds using her left arm  from thigh to shoulder height.    Time 3   Period Weeks           PT Long Term Goals - 07/06/17 1418      PT LONG TERM GOAL #1   Title Pt to increase cervical rotation by 20 degrees in both direction to be able to see her blind spot   Time 6   Period Weeks   Status New   Target Date 08/17/17     PT LONG TERM GOAL #2   Title Pt to be able to single leg stance for 20 seconds so that pt is able to state that she has not fallen in the past three weeks   Time 6   Period Weeks   Status New     PT LONG TERM GOAL #3   Title Pt ROM and strength in her left arm to be increased to be able to lift 4 pounds from waist to shoulder height to be able to put items away in higher cabinets.    Time 6   Period Weeks   Status New     PT LONG TERM GOAL #4   Title Pt pain in her neck to be no greater than a 2/10 to allow pt to complete 30 mintues of continuous housework without increased pain.    Time 6   Period  Weeks   Status New               Plan - 07/14/17 1535    Clinical Impression Statement Reviewed goals, assured complaince wiht HEP and copy of eval given to pt.  Session focus with cervical mobility, posture strengthening and balance activities.  Pt able to verbalize and demonstrate proper form with HEP exercises with minimal cueing.  Pt instructed proper mechanics to improve log roll for pain control.  Therex focus on cervical isometric strengtheing and balance activities.  Min A/HHA for safety with LOB episodes.  EOS with manual to address tightness for pain control.     Rehab Potential Good   PT Frequency 2x / week   PT Duration 6 weeks   PT Treatment/Interventions ADLs/Self Care Home Management;Therapeutic exercise;Balance training;Neuromuscular re-education;Patient/family education;Manual techniques   PT Next Visit Plan Progress balance activities and continue with cervical mobility/strengthening; isometric for cervical SB, extension and Lt shoulder, manual for cervical area.  Progress shoulder and cervical ROM and strengthening.       Patient will benefit from skilled therapeutic intervention in order to improve the following deficits and impairments:  Decreased activity tolerance, Decreased balance  Visit Diagnosis: Radiculopathy, cervical region  Stiffness of left shoulder, not elsewhere classified     Problem List Patient Active Problem List   Diagnosis Date Noted  . S/P shoulder  replacement 05/28/2016  . Weight gain 01/28/2016  . Flatulence 01/28/2016  . S/P lumbar spinal fusion 07/25/2014  . Pain in joint, shoulder region 11/22/2013  . Decreased range of motion of right shoulder 11/22/2013  . Muscle tightness 11/22/2013  . Muscle weakness (generalized) 11/22/2013  . Low back pain 04/28/2011  . Post laminectomy syndrome 04/28/2011  . DYSPHAGIA 09/26/2009  . DIARRHEA 09/26/2009  . FULL INCONTINENCE OF FECES 09/26/2009   Ihor Austin, LPTA;  CBIS [phone]  Aldona Lento 07/14/2017, 5:00 PM  [facility] [address] [place], [place], [address] Phone: [phone]   Fax:  [phone]  Name: Victoria Mccarthy MRN: [id number] Date of Birth: [birth year]

## 2017-07-18 ENCOUNTER — Ambulatory Visit (INDEPENDENT_AMBULATORY_CARE_PROVIDER_SITE_OTHER): Payer: Medicare Other | Admitting: Licensed Clinical Social Worker

## 2017-07-18 DIAGNOSIS — F3130 Bipolar disorder, current episode depressed, mild or moderate severity, unspecified: Secondary | ICD-10-CM
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
## 2017-07-19 ENCOUNTER — Encounter (HOSPITAL_COMMUNITY): Payer: Self-pay

## 2017-07-19 ENCOUNTER — Ambulatory Visit (HOSPITAL_COMMUNITY): Payer: Medicare Other

## 2017-07-19 DIAGNOSIS — M25612 Stiffness of left shoulder, not elsewhere classified: Secondary | ICD-10-CM

## 2017-07-19 DIAGNOSIS — M5412 Radiculopathy, cervical region: Secondary | ICD-10-CM

## 2017-07-19 NOTE — Patient Instructions (Signed)
Neck: Lateral Tilt    Sit with back straight. Tilt head slightly to right while resisting with fingertips. Do not let head move. Do not allow trunk to lean sideways. Hold 5 seconds. Repeat 5-10 times. Do 2 sessions per day. CAUTION: Use steady pressure with fingertips only.  Copyright  VHI. All rights reserved.   Strengthening: Rotation - Isometric (in Neutral)    Using light pressure from fingertips at right temple, resist turning head. Hold 3-5 seconds. Repeat 5-10 times per set. Do 2 sets per session.  http://orth.exer.us/305   Copyright  VHI. All rights reserved.   Isometric Extension    Put index fingers gently on back of head. Slowly try to look toward ceiling. Push head into fingers for 3-5 seconds. Push and release slowly. Repeat 5-10 times. Do 2 sessions per day.  http://gt2.exer.us/21   Copyright  VHI. All rights reserved.

## 2017-07-19 NOTE — Therapy (Signed)
[facility], [place], [address] Phone: [phone]   Fax:  [phone]  Physical Therapy Treatment  Patient Details  Name: Victoria Mccarthy MRN: [id number] Date of Birth: 09/30/1945 Referring Provider: Sherley Bounds  Encounter Date: 07/19/2017      PT End of Session - 07/19/17 1355    Visit Number 3   Number of Visits 12   Date for PT Re-Evaluation 08/05/17   Authorization Type medicare/caid   Authorization - Visit Number 3   Authorization - Number of Visits 10   PT Start Time 1350   PT Stop Time 4431   PT Time Calculation (min) 46 min   Activity Tolerance Patient tolerated treatment well;No increased pain   Behavior During Therapy WFL for tasks assessed/performed      Past Medical History:  Diagnosis Date  . Anemia   . Anxiety   . Arthritis    chronic back pain  . Bipolar disorder ([place])   . Cancer [facility]) Jan 2013   kidney cancer, left, s/p partial nephrectomy  . Depression   . Diarrhea    incontinent stools  . Diverticulitis    treated at least 5 times in past, hospitalized twice  . Heart murmur   . MVP (mitral valve prolapse)   . Neuropathy   . Nocturia   . Osteoarthritis   . Osteoporosis   . PONV (postoperative nausea and vomiting)   . Psychotic affective disorder ([place])    "psychotic delusions" "I cut myself"   . Restless leg syndrome   . Rheumatic fever   . Urinary frequency     Past Surgical History:  Procedure Laterality Date  . ABDOMINAL HYSTERECTOMY    . BACK SURGERY    . BACK SURGERY     2 plates, 8 screws in back  . carpell tunnell     rt wrist  x2  . CATARACT EXTRACTION W/PHACO Left 06/14/2016   Procedure: CATARACT EXTRACTION PHACO AND INTRAOCULAR LENS PLACEMENT; CDE:  3.92;  Surgeon: Williams Che, MD;  Location: [facility];  Service: Ophthalmology;  Laterality: Left;  . CHOLECYSTECTOMY    . COLONOSCOPY  5/02   pancolonic deverticula, internal hemorrhoids  . COLONOSCOPY  1/11    single external hemorrhoidal tag and anal papilla otherwise normal rectum, pancolonic diverticula, s/p sigmoid biopsy and stool sampling all unremarkable  . ESOPHAGOGASTRODUODENOSCOPY  04/06/2010   intact Nissen fundoplication S/P dilation, somewhat baggy atonic appearing esophagus, 58-F dilation  . ESOPHAGOGASTRODUODENOSCOPY  1/11   nomral esophagus s/p 54-F Maloney dilation, normal/intact Nissen fundoplication, diffuse patchy erythema and erosions likely NSAID/ASA effect with benign biopsy  . EYE SURGERY     right eye cataract  . kidney surgery for cancer    . MAXIMUM ACCESS (MAS)POSTERIOR LUMBAR INTERBODY FUSION (PLIF) 1 LEVEL N/A 07/25/2014   Procedure: FOR MAXIMUM ACCESS (MAS) POSTERIOR LUMBAR INTERBODY FUSION (PLIF) Lumbar two/three, Removal of hardware Lumbar three/four;  Surgeon: Eustace Moore, MD;  Location: [facility];  Service: Neurosurgery;  Laterality: N/A;  . MAXIMUM ACCESS (MAS)POSTERIOR LUMBAR INTERBODY FUSION (PLIF) 2 LEVEL N/A 09/11/2015   Procedure: LUMBAR FOUR-FIVE LUMBAR FIVE SACRAL ONE  MAXIMUM ACCESS SURGERY, POSTERIOR LUMBAR INTERBODY FUSION , Removal of LUMBAR TWO TO LUMBAR FOUR  HARDWARE;  Surgeon: Eustace Moore, MD;  Location: [facility];  Service: Neurosurgery;  Laterality: N/A;  . NISSEN FUNDOPLICATION    . REVERSE SHOULDER ARTHROPLASTY Right 05/28/2016   Procedure: RIGHT REVERSE SHOULDER ARTHROPLASTY;  Surgeon:  Netta Cedars, MD;  Location: [facility];  Service: Orthopedics;  Laterality: Right;  . ROBOT ASSISTED LAPAROSCOPIC NEPHRECTOMY  08/23/2011   Procedure: ROBOTIC ASSISTED LAPAROSCOPIC NEPHRECTOMY;  Surgeon: Dutch Gray, MD;  Location: [facility];  Service: Urology;  Laterality: Left;  Left Robotic Assisted  Laparoscopic Partial Nephrectomy   . ROTATOR CUFF REPAIR     right side X 2  . TONSILLECTOMY      There were no vitals filed for this visit.      Subjective Assessment - 07/19/17 1353    Subjective Pt stated she continues to have difficulty with balance, no  reports of recent fall but does have LOB episodes.  Current pain scale 6/10 Lt side of neck and reports some tenderness on Rt shoulder blade.   Pertinent History four back surgeries; 4 surgeries on her shoulder    Patient Stated Goals less pain; to be able to turn her head easier.  To complete housework without pain    Currently in Pain? Yes   Pain Score 6    Pain Location Neck   Pain Orientation Left   Pain Descriptors / Indicators Sharp;Aching;Tightness   Pain Onset More than a month ago   Pain Frequency Constant   Aggravating Factors  turning her head   Pain Relieving Factors not sure   Effect of Pain on Daily Activities driving                         [facility] Adult PT Treatment/Exercise - 07/19/17 0001      Shoulder Exercises: Supine   External Rotation 10 reps   Flexion 10 reps;Left   ABduction Left;10 reps   Other Supine Exercises cervical and scapular retraction x 10 each; cervical rotation x 10 each      Shoulder Exercises: Seated   Other Seated Exercises isometric for extension, rotation and SB 5x 3"- HEP   Other Seated Exercises 3D cervical excursion within tolerance     Manual Therapy   Manual Therapy Soft tissue mobilization   Manual therapy comments Manual complete separate rest of tx   Soft tissue mobilization prone position with pillow under hips, focus on cervical musculature including upper traps, levator scapula, and scalenes             Balance Exercises - 07/19/17 1429      Balance Exercises: Standing   Tandem Stance Eyes open;Foam/compliant surface;2 reps;30 secs   SLS 3 reps  Lt 21", Rt 32" max of 3   Rockerboard Lateral;Intermittent UE support  2 minutes             PT Short Term Goals - 07/06/17 1421      PT SHORT TERM GOAL #1   Title Pt cervical rotation to increase 10 degrees both to the right and left for improved safety of driving    Time 3   Period Weeks   Status New     PT SHORT TERM GOAL #2   Title Pt to be  able to single leg stance on both LE for at least 10 seconds to reduce risk of falls    Time 3   Period Weeks   Status New     PT SHORT TERM GOAL #3   Title Pt pain to greater than a 5/10 to allow pt to complete personal care activites with greater ease.    Time 3   Period Weeks   Status New     PT SHORT TERM  GOAL #4   Title Pt to be able to lift 2 pounds using her left arm  from thigh to shoulder height.    Time 3   Period Weeks           PT Long Term Goals - 07/06/17 1418      PT LONG TERM GOAL #1   Title Pt to increase cervical rotation by 20 degrees in both direction to be able to see her blind spot   Time 6   Period Weeks   Status New   Target Date 08/17/17     PT LONG TERM GOAL #2   Title Pt to be able to single leg stance for 20 seconds so that pt is able to state that she has not fallen in the past three weeks   Time 6   Period Weeks   Status New     PT LONG TERM GOAL #3   Title Pt ROM and strength in her left arm to be increased to be able to lift 4 pounds from waist to shoulder height to be able to put items away in higher cabinets.    Time 6   Period Weeks   Status New     PT LONG TERM GOAL #4   Title Pt pain in her neck to be no greater than a 2/10 to allow pt to complete 30 mintues of continuous housework without increased pain.    Time 6   Period Weeks   Status New               Plan - 07/19/17 1440    Clinical Impression Statement Began session with focus on cervical mobility and pain control.  Pt able to verbalize and demonstrate appropraite form and mechancis with isometric exercises for strengthening, given printout for addional HEP.   Manual soft tissue mobilization complete to address cervical tightness, improved mobility and decrease in pain scale to 4/10 following manual.  Progressed to balance activities with min guard/A for safety, added rockerboard to improve posture with weight shifting with intermittent HHA for balance.  Pt limited  by fatigue at EOS.     PT Frequency 2x / week   PT Duration 6 weeks   PT Treatment/Interventions ADLs/Self Care Home Management;Therapeutic exercise;Balance training;Neuromuscular re-education;Patient/family education;Manual techniques   PT Next Visit Plan Progress cervical/UE mobility and strengthening with additional balance activities.  Next session progress to sitting cervical excurison for mobility, seated postural strengthening and standing  balance activites.  Begin tandem gait and sidestep next session.     PT Home Exercise Plan eval:  Supine:  cervical and scapular retraction, Lt shoulder ROM; cervical rotation; 10/30; isometric for cervical SB, extension and rotation.        Patient will benefit from skilled therapeutic intervention in order to improve the following deficits and impairments:  Decreased activity tolerance, Decreased balance  Visit Diagnosis: Radiculopathy, cervical region  Stiffness of left shoulder, not elsewhere classified     Problem List Patient Active Problem List   Diagnosis Date Noted  . S/P shoulder replacement 05/28/2016  . Weight gain 01/28/2016  . Flatulence 01/28/2016  . S/P lumbar spinal fusion 07/25/2014  . Pain in joint, shoulder region 11/22/2013  . Decreased range of motion of right shoulder 11/22/2013  . Muscle tightness 11/22/2013  . Muscle weakness (generalized) 11/22/2013  . Low back pain 04/28/2011  . Post laminectomy syndrome 04/28/2011  . DYSPHAGIA 09/26/2009  . DIARRHEA 09/26/2009  . FULL INCONTINENCE OF FECES 09/26/2009  [address], LPTA; [place]  Aldona Lento 07/19/2017, 2:51 PM  [facility] [address] [place], [place], [address] Phone: [phone]   Fax:  [phone]  Name: Victoria Mccarthy MRN: [id number] Date of Birth: Aug 07, 1946

## 2017-07-21 ENCOUNTER — Ambulatory Visit (HOSPITAL_COMMUNITY): Payer: Medicare Other | Attending: Neurological Surgery | Admitting: Physical Therapy

## 2017-07-21 DIAGNOSIS — M5412 Radiculopathy, cervical region: Secondary | ICD-10-CM | POA: Diagnosis not present

## 2017-07-21 DIAGNOSIS — M25612 Stiffness of left shoulder, not elsewhere classified: Secondary | ICD-10-CM | POA: Diagnosis not present

## 2017-07-21 NOTE — Therapy (Signed)
[facility] [address] [place], [place], [address] Phone: [phone]   Fax:  [phone]  Physical Therapy Treatment  Patient Details  Name: Victoria Mccarthy MRN: [id number] Date of Birth: June 08, 1946 Referring Provider: Sherley Bounds  Encounter Date: 07/21/2017      PT End of Session - 07/21/17 1500    Visit Number 4   Number of Visits 12   Date for PT Re-Evaluation 08/05/17   Authorization Type medicare/caid   Authorization - Visit Number 4   Authorization - Number of Visits 10   PT Start Time 3154   PT Stop Time 1440   PT Time Calculation (min) 45 min   Activity Tolerance Patient tolerated treatment well;No increased pain   Behavior During Therapy WFL for tasks assessed/performed      Past Medical History:  Diagnosis Date  . Anemia   . Anxiety   . Arthritis    chronic back pain  . Bipolar disorder ([place])   . Cancer [facility]) Jan 2013   kidney cancer, left, s/p partial nephrectomy  . Depression   . Diarrhea    incontinent stools  . Diverticulitis    treated at least 5 times in past, hospitalized twice  . Heart murmur   . MVP (mitral valve prolapse)   . Neuropathy   . Nocturia   . Osteoarthritis   . Osteoporosis   . PONV (postoperative nausea and vomiting)   . Psychotic affective disorder ([place])    "psychotic delusions" "I cut myself"   . Restless leg syndrome   . Rheumatic fever   . Urinary frequency     Past Surgical History:  Procedure Laterality Date  . ABDOMINAL HYSTERECTOMY    . BACK SURGERY    . BACK SURGERY     2 plates, 8 screws in back  . carpell tunnell     rt wrist  x2  . CATARACT EXTRACTION W/PHACO Left 06/14/2016   Procedure: CATARACT EXTRACTION PHACO AND INTRAOCULAR LENS PLACEMENT; CDE:  3.92;  Surgeon: Williams Che, MD;  Location: [facility];  Service: Ophthalmology;  Laterality: Left;  . CHOLECYSTECTOMY    . COLONOSCOPY  5/02   pancolonic deverticula, internal hemorrhoids  . COLONOSCOPY  1/11    single external hemorrhoidal tag and anal papilla otherwise normal rectum, pancolonic diverticula, s/p sigmoid biopsy and stool sampling all unremarkable  . ESOPHAGOGASTRODUODENOSCOPY  04/06/2010   intact Nissen fundoplication S/P dilation, somewhat baggy atonic appearing esophagus, 58-F dilation  . ESOPHAGOGASTRODUODENOSCOPY  1/11   nomral esophagus s/p 54-F Maloney dilation, normal/intact Nissen fundoplication, diffuse patchy erythema and erosions likely NSAID/ASA effect with benign biopsy  . EYE SURGERY     right eye cataract  . kidney surgery for cancer    . MAXIMUM ACCESS (MAS)POSTERIOR LUMBAR INTERBODY FUSION (PLIF) 1 LEVEL N/A 07/25/2014   Procedure: FOR MAXIMUM ACCESS (MAS) POSTERIOR LUMBAR INTERBODY FUSION (PLIF) Lumbar two/three, Removal of hardware Lumbar three/four;  Surgeon: Eustace Moore, MD;  Location: [facility];  Service: Neurosurgery;  Laterality: N/A;  . MAXIMUM ACCESS (MAS)POSTERIOR LUMBAR INTERBODY FUSION (PLIF) 2 LEVEL N/A 09/11/2015   Procedure: LUMBAR FOUR-FIVE LUMBAR FIVE SACRAL ONE  MAXIMUM ACCESS SURGERY, POSTERIOR LUMBAR INTERBODY FUSION , Removal of LUMBAR TWO TO LUMBAR FOUR  HARDWARE;  Surgeon: Eustace Moore, MD;  Location: [facility];  Service: Neurosurgery;  Laterality: N/A;  . NISSEN FUNDOPLICATION    . REVERSE SHOULDER ARTHROPLASTY Right 05/28/2016   Procedure: RIGHT REVERSE SHOULDER ARTHROPLASTY;  Surgeon:  Netta Cedars, MD;  Location: [facility];  Service: Orthopedics;  Laterality: Right;  . ROBOT ASSISTED LAPAROSCOPIC NEPHRECTOMY  08/23/2011   Procedure: ROBOTIC ASSISTED LAPAROSCOPIC NEPHRECTOMY;  Surgeon: Dutch Gray, MD;  [facility];  Service: Urology;  Laterality: Left;  Left Robotic Assisted  Laparoscopic Partial Nephrectomy   . ROTATOR CUFF REPAIR     right side X 2  . TONSILLECTOMY      There were no vitals filed for this visit.      Subjective Assessment - 07/21/17 1400    Subjective Pt states she is hurting worse today than she's been hurting.   Currently 7/10 pain.  Reports she is getting surgery done on her Rt shouulder Novemeber 30 and her Lt shoulder into her neck is really hurting bad today.   Currently in Pain? Yes   Pain Score 7    Pain Location Neck   Pain Orientation Left   Pain Descriptors / Indicators Aching   Pain Radiating Towards into Lt shoulder                         [facility] Adult PT Treatment/Exercise - 07/21/17 0001      Shoulder Exercises: Supine   External Rotation 15 reps   Flexion 15 reps;Left   ABduction 15 reps;Left   Other Supine Exercises cervical and scapular retraction x 10 each; cervical rotation x 10 each      Shoulder Exercises: Seated   Other Seated Exercises 3D cervical excursion within tolerance     Manual Therapy   Manual Therapy Soft tissue mobilization;Myofascial release   Manual therapy comments Manual complete separate rest of tx   Soft tissue mobilization seated to bilateral UT and scap musculature   Myofascial Release sub occ release in supine                  PT Short Term Goals - 07/06/17 1421      PT SHORT TERM GOAL #1   Title Pt cervical rotation to increase 10 degrees both to the right and left for improved safety of driving    Time 3   Period Weeks   Status New     PT SHORT TERM GOAL #2   Title Pt to be able to single leg stance on both LE for at least 10 seconds to reduce risk of falls    Time 3   Period Weeks   Status New     PT SHORT TERM GOAL #3   Title Pt pain to greater than a 5/10 to allow pt to complete personal care activites with greater ease.    Time 3   Period Weeks   Status New     PT SHORT TERM GOAL #4   Title Pt to be able to lift 2 pounds using her left arm  from thigh to shoulder height.    Time 3   Period Weeks           PT Long Term Goals - 07/06/17 1418      PT LONG TERM GOAL #1   Title Pt to increase cervical rotation by 20 degrees in both direction to be able to see her blind spot   Time 6   Period  Weeks   Status New   Target Date 08/17/17     PT LONG TERM GOAL #2   Title Pt to be able to single leg stance for 20 seconds so that pt is able  to state that she has not fallen in the past three weeks   Time 6   Period Weeks   Status New     PT LONG TERM GOAL #3   Title Pt ROM and strength in her left arm to be increased to be able to lift 4 pounds from waist to shoulder height to be able to put items away in higher cabinets.    Time 6   Period Weeks   Status New     PT LONG TERM GOAL #4   Title Pt pain in her neck to be no greater than a 2/10 to allow pt to complete 30 mintues of continuous housework without increased pain.    Time 6   Period Weeks   Status New               Plan - 07/21/17 1500    Clinical Impression Statement Pt with reports of increased pain today.  Reports she does not return to MD until the 20th about her Lt shoulder and having surgery on her Rt on the 30th.  Began session with supine therex while using moist heat to shoulders and cervical region.  PT reported increased confort and reduced pain with addition of heat.  Suboccipital release completed in supine with patient voicing reduction of symptoms as well.  soft tissue massage completed in seated position today per pateint request.  Able to resolve large spasm in lt UT and decrease general tightness throughout cervical region.  Pain decreased at EOS.   PT Frequency 2x / week   PT Duration 6 weeks   PT Treatment/Interventions ADLs/Self Care Home Management;Therapeutic exercise;Balance training;Neuromuscular re-education;Patient/family education;Manual techniques   PT Next Visit Plan Next session progress to seated postural strengthening and standing  balance activites.  Begin tandem gait and sidestep next session.     PT Home Exercise Plan eval:  Supine:  cervical and scapular retraction, Lt shoulder ROM; cervical rotation; 10/30; isometric for cervical SB, extension and rotation.        Patient will  benefit from skilled therapeutic intervention in order to improve the following deficits and impairments:  Decreased activity tolerance, Decreased balance  Visit Diagnosis: Radiculopathy, cervical region  Stiffness of left shoulder, not elsewhere classified     Problem List Patient Active Problem List   Diagnosis Date Noted  . S/P shoulder replacement 05/28/2016  . Weight gain 01/28/2016  . Flatulence 01/28/2016  . S/P lumbar spinal fusion 07/25/2014  . Pain in joint, shoulder region 11/22/2013  . Decreased range of motion of right shoulder 11/22/2013  . Muscle tightness 11/22/2013  . Muscle weakness (generalized) 11/22/2013  . Low back pain 04/28/2011  . Post laminectomy syndrome 04/28/2011  . DYSPHAGIA 09/26/2009  . DIARRHEA 09/26/2009  . FULL INCONTINENCE OF FECES 09/26/2009   Teena Irani, PTA/CLT [phone]  Teena Irani 07/21/2017, 3:13 PM  [facility] [address] [place], [place], [address] Phone: [phone]   Fax:  [phone]  Name: CARLI LEFEVERS MRN: [id number] Date of Birth: 03/29/1946

## 2017-07-26 ENCOUNTER — Encounter (HOSPITAL_COMMUNITY): Payer: Self-pay

## 2017-07-26 ENCOUNTER — Ambulatory Visit (HOSPITAL_COMMUNITY): Payer: Medicare Other

## 2017-07-26 DIAGNOSIS — M5412 Radiculopathy, cervical region: Secondary | ICD-10-CM

## 2017-07-26 DIAGNOSIS — M25612 Stiffness of left shoulder, not elsewhere classified: Secondary | ICD-10-CM

## 2017-07-26 NOTE — Therapy (Signed)
[place], [place], [address] Phone: [phone]   Fax:  [phone]  Physical Therapy Treatment  Patient Details  Name: Victoria Mccarthy MRN: [id number] Date of Birth: 1946-07-24 Referring Provider: Sherley Bounds   Encounter Date: 07/26/2017  PT End of Session - 07/26/17 1356    Visit Number  5    Number of Visits  12    Date for PT Re-Evaluation  08/05/17    Authorization Type  medicare/caid    Authorization - Visit Number  5    Authorization - Number of Visits  10    PT Start Time  4401    PT Stop Time  0272    PT Time Calculation (min)  49 min    Activity Tolerance  Patient tolerated treatment well;No increased pain    Behavior During Therapy  WFL for tasks assessed/performed       Past Medical History:  Diagnosis Date  . Anemia   . Anxiety   . Arthritis    chronic back pain  . Bipolar disorder ([place])   . Cancer [facility]) Jan 2013   kidney cancer, left, s/p partial nephrectomy  . Depression   . Diarrhea    incontinent stools  . Diverticulitis    treated at least 5 times in past, hospitalized twice  . Heart murmur   . MVP (mitral valve prolapse)   . Neuropathy   . Nocturia   . Osteoarthritis   . Osteoporosis   . PONV (postoperative nausea and vomiting)   . Psychotic affective disorder ([place])    "psychotic delusions" "I cut myself"   . Restless leg syndrome   . Rheumatic fever   . Urinary frequency     Past Surgical History:  Procedure Laterality Date  . ABDOMINAL HYSTERECTOMY    . BACK SURGERY    . BACK SURGERY     2 plates, 8 screws in back  . carpell tunnell     rt wrist  x2  . CHOLECYSTECTOMY    . COLONOSCOPY  5/02   pancolonic deverticula, internal hemorrhoids  . COLONOSCOPY  1/11   single external hemorrhoidal tag and anal papilla otherwise normal rectum, pancolonic diverticula, s/p sigmoid biopsy and stool sampling all unremarkable  . ESOPHAGOGASTRODUODENOSCOPY  04/06/2010   intact  Nissen fundoplication S/P dilation, somewhat baggy atonic appearing esophagus, 58-F dilation  . ESOPHAGOGASTRODUODENOSCOPY  1/11   nomral esophagus s/p 54-F Maloney dilation, normal/intact Nissen fundoplication, diffuse patchy erythema and erosions likely NSAID/ASA effect with benign biopsy  . EYE SURGERY     right eye cataract  . kidney surgery for cancer    . NISSEN FUNDOPLICATION    . ROTATOR CUFF REPAIR     right side X 2  . TONSILLECTOMY      There were no vitals filed for this visit.  Subjective Assessment - 07/26/17 1352    Subjective  Pt stated she hasn't found any improvements iwth therapy, continues to have difficulty rotating head to Lt and pain on Lt side of neck. Reports relief from pain for a couple hours following massage.  Returns to MD later this week    Patient Stated Goals  less pain; to be able to turn her head easier.  To complete housework without pain     Currently in Pain?  Yes    Pain Score  5     Pain Location  Neck    Pain Orientation  Left    Pain  Descriptors / Public librarian;Aching    Pain Type  Chronic pain    Pain Radiating Towards  down Lt UE with Rt rotation    Pain Onset  More than a month ago    Pain Frequency  Constant    Aggravating Factors   turning her head    Pain Relieving Factors  not sure    Effect of Pain on Daily Activities  driving         [facility] PT Assessment - 07/26/17 0001      Assessment   Medical Diagnosis  cervical pain    Referring Provider  Sherley Bounds    Onset Date/Surgical Date  06/05/17    Next MD Visit  11    Prior Therapy  none      Precautions   Precautions  None      AROM   Cervical - Right Side Bend  18    Cervical - Left Side Bend  13    Cervical - Right Rotation  42 was 40   was 40   Cervical - Left Rotation  27 25   25                  [facility] Adult PT Treatment/Exercise - 07/26/17 0001      Shoulder Exercises: Seated   Other Seated Exercises  Posutral education: cervical retraction (L  hand to improve mechanics); cervical and scapular retraction 10x; wback 10x    Other Seated Exercises  3D cervical excursion within tolerance      Manual Therapy   Manual Therapy  Soft tissue mobilization;Other (comment)    Manual therapy comments  Manual complete separate rest of tx    Soft tissue mobilization  Prone for periscapular and levator scapula; supine for upper traps, scalenes and levator scapula.    Other Manual Therapy  Position release technqiues to Upper traps within pain free range          Balance Exercises - 07/26/17 1444      Balance Exercises: Standing   Tandem Gait  2 reps    Retro Gait  2 reps    Sidestepping  2 reps;Theraband RTB   RTB       PT Education - 07/26/17 1453    Education provided  Yes    Education Details  Educated importance of posture to reduce stress on cervical musculature    Person(s) Educated  Patient    Methods  Explanation;Demonstration    Comprehension  Returned demonstration;Verbalized understanding;Need further instruction       PT Short Term Goals - 07/26/17 1358      PT SHORT TERM GOAL #1   Title  Pt cervical rotation to increase 10 degrees both to the right and left for improved safety of driving         PT Long Term Goals - 07/06/17 1418      PT LONG TERM GOAL #1   Title  Pt to increase cervical rotation by 20 degrees in both direction to be able to see her blind spot    Time  6    Period  Weeks    Status  New    Target Date  08/17/17      PT LONG TERM GOAL #2   Title  Pt to be able to single leg stance for 20 seconds so that pt is able to state that she has not fallen in the past three weeks    Time  6  Period  Weeks    Status  New      PT LONG TERM GOAL #3   Title  Pt ROM and strength in her left arm to be increased to be able to lift 4 pounds from waist to shoulder height to be able to put items away in higher cabinets.     Time  6    Period  Weeks    Status  New      PT LONG TERM GOAL #4   Title   Pt pain in her neck to be no greater than a 2/10 to allow pt to complete 30 mintues of continuous housework without increased pain.     Time  6    Period  Weeks    Status  New            Plan - 07/26/17 1444    Clinical Impression Statement  Pt reports she feels she isn't making any progress towards ROM or pain control so far (4 sessions complete).  Began session wiht education on importance of posture to reduce stress on cervical musculature.  Progressed therex to seated posture strengthening with ability to complete all exercises with minimal difficulty.  Manual soft tissue mobilization complete to address tightness/knots in upper traps, levator scapula, scalenes and periscapular musculature especailly Rhomboids; reports of relief and improve rotation following manual, does state joint pain at end range Lt cervical rotation.      Rehab Potential  Good    PT Frequency  2x / week    PT Duration  6 weeks    PT Treatment/Interventions  ADLs/Self Care Home Management;Therapeutic exercise;Balance training;Neuromuscular re-education;Patient/family education;Manual techniques    PT Next Visit Plan  Continue cervical mobility, progress to standing theraband strengthening and balance activites.  Progress to balance beam with tandem and sidestep next session.      PT Home Exercise Plan  eval:  Supine:  cervical and scapular retraction, Lt shoulder ROM; cervical rotation; 10/30; isometric for cervical SB, extension and rotation.         Patient will benefit from skilled therapeutic intervention in order to improve the following deficits and impairments:  Decreased activity tolerance, Decreased balance  Visit Diagnosis: Radiculopathy, cervical region  Stiffness of left shoulder, not elsewhere classified     Problem List Patient Active Problem List   Diagnosis Date Noted  . S/P shoulder replacement 05/28/2016  . Weight gain 01/28/2016  . Flatulence 01/28/2016  . S/P lumbar spinal fusion  07/25/2014  . Pain in joint, shoulder region 11/22/2013  . Decreased range of motion of right shoulder 11/22/2013  . Muscle tightness 11/22/2013  . Muscle weakness (generalized) 11/22/2013  . Low back pain 04/28/2011  . Post laminectomy syndrome 04/28/2011  . DYSPHAGIA 09/26/2009  . DIARRHEA 09/26/2009  . FULL INCONTINENCE OF FECES 09/26/2009   Ihor Austin, LPTA; CBIS [phone]  Aldona Lento 07/26/2017, 2:54 PM  [facility] [address] [place], [place], [address] Phone: [phone]   Fax:  [phone]  Name: Victoria Mccarthy MRN: [id number] Date of Birth: November 27, 1945

## 2017-07-27 DIAGNOSIS — E6609 Other obesity due to excess calories: Secondary | ICD-10-CM | POA: Diagnosis not present

## 2017-07-27 DIAGNOSIS — Z1389 Encounter for screening for other disorder: Secondary | ICD-10-CM | POA: Diagnosis not present

## 2017-07-27 DIAGNOSIS — Z0000 Encounter for general adult medical examination without abnormal findings: Secondary | ICD-10-CM | POA: Diagnosis not present

## 2017-07-27 DIAGNOSIS — Z6831 Body mass index (BMI) 31.0-31.9, adult: Secondary | ICD-10-CM | POA: Diagnosis not present

## 2017-07-27 DIAGNOSIS — R7309 Other abnormal glucose: Secondary | ICD-10-CM | POA: Diagnosis not present

## 2017-07-28 ENCOUNTER — Other Ambulatory Visit (HOSPITAL_COMMUNITY): Payer: Self-pay | Admitting: Physician Assistant

## 2017-07-28 ENCOUNTER — Other Ambulatory Visit: Payer: Self-pay

## 2017-07-28 ENCOUNTER — Ambulatory Visit (HOSPITAL_COMMUNITY): Payer: Medicare Other | Admitting: Physical Therapy

## 2017-07-28 DIAGNOSIS — M25612 Stiffness of left shoulder, not elsewhere classified: Secondary | ICD-10-CM | POA: Diagnosis not present

## 2017-07-28 DIAGNOSIS — M5412 Radiculopathy, cervical region: Secondary | ICD-10-CM | POA: Diagnosis not present

## 2017-07-28 DIAGNOSIS — Z1231 Encounter for screening mammogram for malignant neoplasm of breast: Secondary | ICD-10-CM

## 2017-07-28 DIAGNOSIS — E2839 Other primary ovarian failure: Secondary | ICD-10-CM

## 2017-07-28 NOTE — Therapy (Signed)
[place], [place], [address] Phone: [phone]   Fax:  [phone]  Physical Therapy Treatment  Patient Details  Name: Victoria Mccarthy MRN: [id number] Date of Birth: 09/22/1945 Referring Provider: Sherley Bounds   Encounter Date: 07/28/2017  PT End of Session - 07/28/17 1404    Visit Number  6    Number of Visits  12    Date for PT Re-Evaluation  08/05/17    Authorization Type  medicare/caid    Authorization - Visit Number  6    Authorization - Number of Visits  10    PT Start Time  8466    PT Stop Time  1435    PT Time Calculation (min)  38 min    Activity Tolerance  Patient tolerated treatment well;No increased pain    Behavior During Therapy  WFL for tasks assessed/performed       Past Medical History:  Diagnosis Date  . Anemia   . Anxiety   . Arthritis    chronic back pain  . Bipolar disorder ([place])   . Cancer [facility]) Jan 2013   kidney cancer, left, s/p partial nephrectomy  . Depression   . Diarrhea    incontinent stools  . Diverticulitis    treated at least 5 times in past, hospitalized twice  . Heart murmur   . MVP (mitral valve prolapse)   . Neuropathy   . Nocturia   . Osteoarthritis   . Osteoporosis   . PONV (postoperative nausea and vomiting)   . Psychotic affective disorder ([place])    "psychotic delusions" "I cut myself"   . Restless leg syndrome   . Rheumatic fever   . Urinary frequency     Past Surgical History:  Procedure Laterality Date  . ABDOMINAL HYSTERECTOMY    . BACK SURGERY    . BACK SURGERY     2 plates, 8 screws in back  . carpell tunnell     rt wrist  x2  . CHOLECYSTECTOMY    . COLONOSCOPY  5/02   pancolonic deverticula, internal hemorrhoids  . COLONOSCOPY  1/11   single external hemorrhoidal tag and anal papilla otherwise normal rectum, pancolonic diverticula, s/p sigmoid biopsy and stool sampling all unremarkable  . ESOPHAGOGASTRODUODENOSCOPY  04/06/2010   intact  Nissen fundoplication S/P dilation, somewhat baggy atonic appearing esophagus, 58-F dilation  . ESOPHAGOGASTRODUODENOSCOPY  1/11   nomral esophagus s/p 54-F Maloney dilation, normal/intact Nissen fundoplication, diffuse patchy erythema and erosions likely NSAID/ASA effect with benign biopsy  . EYE SURGERY     right eye cataract  . kidney surgery for cancer    . NISSEN FUNDOPLICATION    . ROTATOR CUFF REPAIR     right side X 2  . TONSILLECTOMY      There were no vitals filed for this visit.  Subjective Assessment - 07/28/17 1359    Subjective  Pt states that she does not feel that therapy has helped her neck so far; states that her head feels like a thousand pounds.   Pt states that she is doing her exercises twice a day.     Patient Stated Goals  less pain; to be able to turn her head easier.  To complete housework without pain     Currently in Pain?  Yes    Pain Score  5     Pain Location  Neck    Pain Orientation  Left    Pain Descriptors / Indicators  Aching    Pain Type  Chronic pain    Pain Onset  More than a month ago    Pain Frequency  Constant intensity varies     Aggravating Factors   turning her head     Pain Relieving Factors  heat     Effect of Pain on Daily Activities  increases                       [facility] Adult PT Treatment/Exercise - 07/28/17 0001      Shoulder Exercises: Supine   Flexion  Left;10 reps    Other Supine Exercises  cervical and scapular retraction x 15 each; cervical rotation x 10 each     Other Supine Exercises  cervical isometric for B sidebend and extension x 10 ; cervical SB and rotatoin AROM x 10       Manual Therapy   Manual Therapy  Soft tissue mobilization;Other (comment)    Manual therapy comments  Manual complete separate rest of tx    Soft tissue mobilization  supine position with LE elevated, massage to upper trap, levator, scalenes, reports of relief with manual traction 2x 1'    Myofascial Release  sub occ release in  supine               PT Short Term Goals - 07/26/17 1358      PT SHORT TERM GOAL #1   Title  Pt cervical rotation to increase 10 degrees both to the right and left for improved safety of driving         PT Long Term Goals - 07/06/17 1418      PT LONG TERM GOAL #1   Title  Pt to increase cervical rotation by 20 degrees in both direction to be able to see her blind spot    Time  6    Period  Weeks    Status  New    Target Date  08/17/17      PT LONG TERM GOAL #2   Title  Pt to be able to single leg stance for 20 seconds so that pt is able to state that she has not fallen in the past three weeks    Time  6    Period  Weeks    Status  New      PT LONG TERM GOAL #3   Title  Pt ROM and strength in her left arm to be increased to be able to lift 4 pounds from waist to shoulder height to be able to put items away in higher cabinets.     Time  6    Period  Weeks    Status  New      PT LONG TERM GOAL #4   Title  Pt pain in her neck to be no greater than a 2/10 to allow pt to complete 30 mintues of continuous housework without increased pain.     Time  6    Period  Weeks    Status  New            Plan - 07/28/17 1405    Clinical Impression Statement  Treatment concentrated on cervical stability, mobility and pain.  We will stop balance activity until pt cervical issues are resolved or nearly resolved.     Rehab Potential  Good    PT Frequency  2x / week    PT Duration  6 weeks    PT  Treatment/Interventions  ADLs/Self Care Home Management;Therapeutic exercise;Balance training;Neuromuscular re-education;Patient/family education;Manual techniques    PT Next Visit Plan  Focus on cervical pain and lack of motion hold balance activity.     PT Home Exercise Plan  eval:  Supine:  cervical and scapular retraction, Lt shoulder ROM; cervical rotation; 10/30; isometric for cervical SB, extension and rotation.         Patient will benefit from skilled therapeutic intervention  in order to improve the following deficits and impairments:  Decreased activity tolerance, Decreased balance  Visit Diagnosis: Radiculopathy, cervical region  Stiffness of left shoulder, not elsewhere classified     Problem List Patient Active Problem List   Diagnosis Date Noted  . S/P shoulder replacement 05/28/2016  . Weight gain 01/28/2016  . Flatulence 01/28/2016  . S/P lumbar spinal fusion 07/25/2014  . Pain in joint, shoulder region 11/22/2013  . Decreased range of motion of right shoulder 11/22/2013  . Muscle tightness 11/22/2013  . Muscle weakness (generalized) 11/22/2013  . Low back pain 04/28/2011  . Post laminectomy syndrome 04/28/2011  . DYSPHAGIA 09/26/2009  . DIARRHEA 09/26/2009  . FULL INCONTINENCE OF FECES 09/26/2009    Rayetta Humphrey, PT CLT [phone] 07/28/2017, 2:38 PM  [facility] [address] [place], [place], [address] Phone: [phone]   Fax:  [phone]  Name: Victoria Mccarthy MRN: [id number] Date of Birth: 1946-01-04

## 2017-07-31 ENCOUNTER — Other Ambulatory Visit (HOSPITAL_COMMUNITY): Payer: Self-pay | Admitting: Psychiatry

## 2017-08-02 ENCOUNTER — Ambulatory Visit (HOSPITAL_COMMUNITY): Payer: Medicare Other | Admitting: Physical Therapy

## 2017-08-02 ENCOUNTER — Telehealth (HOSPITAL_COMMUNITY): Payer: Self-pay | Admitting: Physician Assistant

## 2017-08-02 NOTE — Telephone Encounter (Signed)
08/02/17  Pt left a message to cx because she had to carry her daughter to the ER

## 2017-08-04 ENCOUNTER — Other Ambulatory Visit: Payer: Self-pay

## 2017-08-04 ENCOUNTER — Ambulatory Visit (HOSPITAL_COMMUNITY): Payer: Medicare Other | Admitting: Physical Therapy

## 2017-08-04 DIAGNOSIS — M5412 Radiculopathy, cervical region: Secondary | ICD-10-CM

## 2017-08-04 DIAGNOSIS — M25612 Stiffness of left shoulder, not elsewhere classified: Secondary | ICD-10-CM

## 2017-08-04 NOTE — Therapy (Signed)
[facility] [address] [place], [place], [address] Phone: [phone]   Fax:  [phone]  Physical Therapy Treatment  Patient Details  Name: Victoria Mccarthy MRN: [id number] Date of Birth: Aug 09, 1946 Referring Provider: Sherley Bounds   Encounter Date: 08/04/2017  PT End of Session - 08/04/17 1440    Visit Number  7    Number of Visits  12    Date for PT Re-Evaluation  08/05/17    Authorization Type  medicare/caid    Authorization - Visit Number  7    Authorization - Number of Visits  10    PT Start Time  4742    PT Stop Time  5956    PT Time Calculation (min)  45 min    Activity Tolerance  Patient tolerated treatment well;No increased pain    Behavior During Therapy  WFL for tasks assessed/performed       Past Medical History:  Diagnosis Date  . Anemia   . Anxiety   . Arthritis    chronic back pain  . Bipolar disorder ([place])   . Cancer [facility]) Jan 2013   kidney cancer, left, s/p partial nephrectomy  . Depression   . Diarrhea    incontinent stools  . Diverticulitis    treated at least 5 times in past, hospitalized twice  . Heart murmur   . MVP (mitral valve prolapse)   . Neuropathy   . Nocturia   . Osteoarthritis   . Osteoporosis   . PONV (postoperative nausea and vomiting)   . Psychotic affective disorder ([place])    "psychotic delusions" "I cut myself"   . Restless leg syndrome   . Rheumatic fever   . Urinary frequency     Past Surgical History:  Procedure Laterality Date  . ABDOMINAL HYSTERECTOMY    . BACK SURGERY    . BACK SURGERY     2 plates, 8 screws in back  . carpell tunnell     rt wrist  x2  . CATARACT EXTRACTION W/PHACO Left 06/14/2016   Procedure: CATARACT EXTRACTION PHACO AND INTRAOCULAR LENS PLACEMENT; CDE:  3.92;  Surgeon: Williams Che, MD;  Location: [facility];  Service: Ophthalmology;  Laterality: Left;  . CHOLECYSTECTOMY    . COLONOSCOPY  5/02   pancolonic deverticula, internal hemorrhoids  .  COLONOSCOPY  1/11   single external hemorrhoidal tag and anal papilla otherwise normal rectum, pancolonic diverticula, s/p sigmoid biopsy and stool sampling all unremarkable  . ESOPHAGOGASTRODUODENOSCOPY  04/06/2010   intact Nissen fundoplication S/P dilation, somewhat baggy atonic appearing esophagus, 58-F dilation  . ESOPHAGOGASTRODUODENOSCOPY  1/11   nomral esophagus s/p 54-F Maloney dilation, normal/intact Nissen fundoplication, diffuse patchy erythema and erosions likely NSAID/ASA effect with benign biopsy  . EYE SURGERY     right eye cataract  . kidney surgery for cancer    . MAXIMUM ACCESS (MAS)POSTERIOR LUMBAR INTERBODY FUSION (PLIF) 1 LEVEL N/A 07/25/2014   Procedure: FOR MAXIMUM ACCESS (MAS) POSTERIOR LUMBAR INTERBODY FUSION (PLIF) Lumbar two/three, Removal of hardware Lumbar three/four;  Surgeon: Eustace Moore, MD;  Location: [facility];  Service: Neurosurgery;  Laterality: N/A;  . MAXIMUM ACCESS (MAS)POSTERIOR LUMBAR INTERBODY FUSION (PLIF) 2 LEVEL N/A 09/11/2015   Procedure: LUMBAR FOUR-FIVE LUMBAR FIVE SACRAL ONE  MAXIMUM ACCESS SURGERY, POSTERIOR LUMBAR INTERBODY FUSION , Removal of LUMBAR TWO TO LUMBAR FOUR  HARDWARE;  Surgeon: Eustace Moore, MD;  Location: [facility];  Service: Neurosurgery;  Laterality: N/A;  . NISSEN  FUNDOPLICATION    . REVERSE SHOULDER ARTHROPLASTY Right 05/28/2016   Procedure: RIGHT REVERSE SHOULDER ARTHROPLASTY;  Surgeon: Netta Cedars, MD;  Location: [facility];  Service: Orthopedics;  Laterality: Right;  . ROBOT ASSISTED LAPAROSCOPIC NEPHRECTOMY  08/23/2011   Procedure: ROBOTIC ASSISTED LAPAROSCOPIC NEPHRECTOMY;  Surgeon: Dutch Gray, MD;  Location: [facility];  Service: Urology;  Laterality: Left;  Left Robotic Assisted  Laparoscopic Partial Nephrectomy   . ROTATOR CUFF REPAIR     right side X 2  . TONSILLECTOMY      There were no vitals filed for this visit.  Subjective Assessment - 08/04/17 1347    Subjective  Pt continues to have significant pain and  discomfort in her neck and back. She is compliant with her HEP     Patient Stated Goals  less pain; to be able to turn her head easier.  To complete housework without pain     Currently in Pain?  Yes    Pain Score  7     Pain Location  Neck    Pain Descriptors / Indicators  Aching    Pain Onset  More than a month ago    Pain Frequency  Constant    Aggravating Factors   Turning her head and bending down     Pain Relieving Factors  nothing                       [facility] Adult PT Treatment/Exercise - 08/04/17 0001      Exercises   Exercises  Neck      Neck Exercises: Standing   Neck Retraction  10 reps    Wall Push Ups  10 reps    UE Flexion with Stabilization Limitations  10      Neck Exercises: Seated   Other Seated Exercise  cervical and thoracic excursion exercises x 4       Neck Exercises: Supine   Neck Retraction  5 reps      Shoulder Exercises: Standing   Extension  Strengthening;Both;15 reps;Theraband    Theraband Level (Shoulder Extension)  Level 3 (Green)    Row  Strengthening;Both;15 reps;Theraband    Theraband Level (Shoulder Row)  Level 3 (Green)    Retraction  Strengthening;Both;15 reps;Theraband    Theraband Level (Shoulder Retraction)  Level 3 (Green)    Shoulder Elevation  Both;5 reps retraction and relax       Shoulder Exercises: ROM/Strengthening   "W" Arms  15 2#       Manual Therapy   Manual Therapy  Soft tissue mobilization;Other (comment)    Manual therapy comments  Manual complete separate rest of tx    Soft tissue mobilization  supine position with LE elevated, massage to upper trap, levator, scalenes, reports of relief with manual traction 2x 1'    Myofascial Release  sub occ release in supine               PT Short Term Goals - 07/26/17 1358      PT SHORT TERM GOAL #1   Title  Pt cervical rotation to increase 10 degrees both to the right and left for improved safety of driving         PT Long Term Goals - 07/06/17  1418      PT LONG TERM GOAL #1   Title  Pt to increase cervical rotation by 20 degrees in both direction to be able to see her blind spot    Time  6    Period  Weeks    Status  New    Target Date  08/17/17      PT LONG TERM GOAL #2   Title  Pt to be able to single leg stance for 20 seconds so that pt is able to state that she has not fallen in the past three weeks    Time  6    Period  Weeks    Status  New      PT LONG TERM GOAL #3   Title  Pt ROM and strength in her left arm to be increased to be able to lift 4 pounds from waist to shoulder height to be able to put items away in higher cabinets.     Time  6    Period  Weeks    Status  New      PT LONG TERM GOAL #4   Title  Pt pain in her neck to be no greater than a 2/10 to allow pt to complete 30 mintues of continuous housework without increased pain.     Time  6    Period  Weeks    Status  New            Plan - 08/04/17 1441    Clinical Impression Statement  Added cervical stability exercises with AA ROM with cervical mobility exercises to encourage pt to increase AROM.  No spasm bu tightness noted with palpation.    Rehab Potential  Good    PT Frequency  2x / week    PT Duration  6 weeks    PT Treatment/Interventions  ADLs/Self Care Home Management;Therapeutic exercise;Balance training;Neuromuscular re-education;Patient/family education;Manual techniques    PT Next Visit Plan  Focus on cervical pain and lack of motion hold balance activity.     PT Home Exercise Plan  eval:  Supine:  cervical and scapular retraction, Lt shoulder ROM; cervical rotation; 10/30; isometric for cervical SB, extension and rotation.         Patient will benefit from skilled therapeutic intervention in order to improve the following deficits and impairments:  Decreased activity tolerance, Decreased balance  Visit Diagnosis: Radiculopathy, cervical region  Stiffness of left shoulder, not elsewhere classified     Problem List Patient  Active Problem List   Diagnosis Date Noted  . S/P shoulder replacement 05/28/2016  . Weight gain 01/28/2016  . Flatulence 01/28/2016  . S/P lumbar spinal fusion 07/25/2014  . Pain in joint, shoulder region 11/22/2013  . Decreased range of motion of right shoulder 11/22/2013  . Muscle tightness 11/22/2013  . Muscle weakness (generalized) 11/22/2013  . Low back pain 04/28/2011  . Post laminectomy syndrome 04/28/2011  . DYSPHAGIA 09/26/2009  . DIARRHEA 09/26/2009  . FULL INCONTINENCE OF FECES 09/26/2009    Rayetta Humphrey, PT CLT [phone] 08/04/2017, 2:43 PM  [facility] [address] [place], [place], [address] Phone: [phone]   Fax:  [phone]  Name: Victoria Mccarthy MRN: [id number] Date of Birth: May 23, 1946

## 2017-08-08 ENCOUNTER — Ambulatory Visit (INDEPENDENT_AMBULATORY_CARE_PROVIDER_SITE_OTHER): Payer: Medicare Other | Admitting: Licensed Clinical Social Worker

## 2017-08-08 ENCOUNTER — Encounter (HOSPITAL_COMMUNITY): Payer: Self-pay | Admitting: Licensed Clinical Social Worker

## 2017-08-08 DIAGNOSIS — F3130 Bipolar disorder, current episode depressed, mild or moderate severity, unspecified: Secondary | ICD-10-CM | POA: Diagnosis not present

## 2017-08-08 NOTE — Progress Notes (Signed)
   THERAPIST PROGRESS NOTE  Session Time: 3:00 pm- 4:00 pm  Participation Level: Active  Behavioral Response: CasualAlertDepressed  Type of Therapy: Individual Therapy  Treatment Goals addressed: Coping  Interventions: CBT and Solution Focused  Summary: Victoria Mccarthy is a 71 y.o. female who presents oriented x5 (person, place, situation, time and object), distraced, depressed mood, neatly dressed, well groomed, and cooperative to address depression. Patient has a history of health issues that include shoulder injury, and lumbar spine fusion. She has a history of mental health treatment that includes outpatient therapy, medication management, and hospitalization. Patient denies symptoms of mania but reports her last manic episode to be 2 or 3 years prior. Patient denies current suicidal and homicidal thoughts. She denies psychosis including auditory and visual hallucinations but admits to experiencing delusions in the past when not medicated including thinking she had murdered someone and got away with it. Patient denies substance use. Patient is at low risk for lethality.   Patient had an average score of 4.50 out of 10 on the Outcome Rating Scale which is an increase of 1 since the last session. Patient was very future oriented. She spoke of her future plans of selling off land given to her by her mother and buying a new home and getting out of debt. Patient stated that she has hope now and is getting out more. Patient was worried about plans for Christmas because she usually spends it with her mother who passed away. She is also worried about the anniversary of her mother's passing which is coming up. After discussion, patient understood that she needs to find away to honor her mother on the anniversary coming up and she also understood she could go to the movies or find a friend to spend time with on Christmas. Patient has a surgery coming up at the end of the month and will be recovering  during the month of December. Patient understood that she needs to stay in contact with family during this time so she doesn't feel isolated, helpless and get depressed.  Patient rated the session 10 out of 10 on the Session Rating Scale.  Patient engaged in session. She responded well to interventions. Patient continues to meet criteria for Bipolar I disorder, most recent episode depressed. Patient will continue in outpatient therapy due to being the least restrictive service to meet her needs. Patient made minimal  progress on her goal at this time.   Suicidal/Homicidal: Negativewithout intent/plan  Therapist Response: Therapist reviewed patient's recent thoughts and behaviors. Therapist utilized CBT to address mood and depression. Therapist discussed patient's progress. Therapist processed patient's feelings to identify triggers for mood. Therapist allowed space for patient to share her feelings about the future. Therapist discussed ways patient can reduce depression and stay active. Therapist administered the Outcome Rating Scale and the Session Rating Scale.   Plan: Return again in 1-2 weeks.   Therapist will review patient goals on or before 10.09.2018  Diagnosis: Axis I: Bipolar, Depressed    Axis II: No diagnosis    Glori Bickers, LCSW 08/08/2017

## 2017-08-09 ENCOUNTER — Telehealth (HOSPITAL_COMMUNITY): Payer: Self-pay | Admitting: Physician Assistant

## 2017-08-09 ENCOUNTER — Ambulatory Visit (HOSPITAL_COMMUNITY): Payer: Medicare Other

## 2017-08-09 DIAGNOSIS — Z6830 Body mass index (BMI) 30.0-30.9, adult: Secondary | ICD-10-CM | POA: Diagnosis not present

## 2017-08-09 DIAGNOSIS — I10 Essential (primary) hypertension: Secondary | ICD-10-CM | POA: Diagnosis not present

## 2017-08-09 DIAGNOSIS — M542 Cervicalgia: Secondary | ICD-10-CM | POA: Diagnosis not present

## 2017-08-09 NOTE — Telephone Encounter (Signed)
08/09/17  pt left a message to cx today - said she had to go back to her dr.

## 2017-08-10 NOTE — H&P (Signed)
Victoria Mccarthy is an 71 y.o. female.    Chief Complaint: right shoulder pain and laxity  HPI: Pt is a 71 y.o. female complaining of right shoulder pain for multiple years. Pain had continually increased since the beginning. X-rays in the clinic show right reverse total shoulder. Pt has tried various conservative treatments which have failed to alleviate their symptoms. Various options are discussed with the patient. Risks, benefits and expectations were discussed with the patient. Patient understand the risks, benefits and expectations and wishes to proceed with surgery.   PCP:  Ginger Organ  D/C Plans: Home  PMH: Past Medical History:  Diagnosis Date  . Anemia   . Anxiety   . Arthritis    chronic back pain  . Bipolar disorder ([place])   . Cancer [facility]) Jan 2013   kidney cancer, left, s/p partial nephrectomy  . Depression   . Diarrhea    incontinent stools  . Diverticulitis    treated at least 5 times in past, hospitalized twice  . Heart murmur   . MVP (mitral valve prolapse)   . Neuropathy   . Nocturia   . Osteoarthritis   . Osteoporosis   . PONV (postoperative nausea and vomiting)   . Psychotic affective disorder ([place])    "psychotic delusions" "I cut myself"   . Restless leg syndrome   . Rheumatic fever   . Urinary frequency     PSH: Past Surgical History:  Procedure Laterality Date  . ABDOMINAL HYSTERECTOMY    . BACK SURGERY    . BACK SURGERY     2 plates, 8 screws in back  . carpell tunnell     rt wrist  x2  . CATARACT EXTRACTION W/PHACO Left 06/14/2016   Procedure: CATARACT EXTRACTION PHACO AND INTRAOCULAR LENS PLACEMENT; CDE:  3.92;  Surgeon: Williams Che, MD;  Location: [facility];  Service: Ophthalmology;  Laterality: Left;  . CHOLECYSTECTOMY    . COLONOSCOPY  5/02   pancolonic deverticula, internal hemorrhoids  . COLONOSCOPY  1/11   single external hemorrhoidal tag and anal papilla otherwise normal rectum, pancolonic diverticula, s/p sigmoid  biopsy and stool sampling all unremarkable  . ESOPHAGOGASTRODUODENOSCOPY  04/06/2010   intact Nissen fundoplication S/P dilation, somewhat baggy atonic appearing esophagus, 58-F dilation  . ESOPHAGOGASTRODUODENOSCOPY  1/11   nomral esophagus s/p 54-F Maloney dilation, normal/intact Nissen fundoplication, diffuse patchy erythema and erosions likely NSAID/ASA effect with benign biopsy  . EYE SURGERY     right eye cataract  . kidney surgery for cancer    . MAXIMUM ACCESS (MAS)POSTERIOR LUMBAR INTERBODY FUSION (PLIF) 1 LEVEL N/A 07/25/2014   Procedure: FOR MAXIMUM ACCESS (MAS) POSTERIOR LUMBAR INTERBODY FUSION (PLIF) Lumbar two/three, Removal of hardware Lumbar three/four;  Surgeon: Eustace Moore, MD;  Location: [place] [facility];  Service: Neurosurgery;  Laterality: N/A;  . MAXIMUM ACCESS (MAS)POSTERIOR LUMBAR INTERBODY FUSION (PLIF) 2 LEVEL N/A 09/11/2015   Procedure: LUMBAR FOUR-FIVE LUMBAR FIVE SACRAL ONE  MAXIMUM ACCESS SURGERY, POSTERIOR LUMBAR INTERBODY FUSION , Removal of LUMBAR TWO TO LUMBAR FOUR  HARDWARE;  Surgeon: Eustace Moore, MD;  Location: [place] [facility];  Service: Neurosurgery;  Laterality: N/A;  . NISSEN FUNDOPLICATION    . REVERSE SHOULDER ARTHROPLASTY Right 05/28/2016   Procedure: RIGHT REVERSE SHOULDER ARTHROPLASTY;  Surgeon: Netta Cedars, MD;  Location: [place];  Service: Orthopedics;  Laterality: Right;  . ROBOT ASSISTED LAPAROSCOPIC NEPHRECTOMY  08/23/2011   Procedure: ROBOTIC ASSISTED LAPAROSCOPIC NEPHRECTOMY;  Surgeon: Dutch Gray, MD;  Location:  [facility];  Service: Urology;  Laterality: Left;  Left Robotic Assisted  Laparoscopic Partial Nephrectomy   . ROTATOR CUFF REPAIR     right side X 2  . TONSILLECTOMY      Social History:  reports that  has never smoked. she has never used smokeless tobacco. She reports that she does not drink alcohol or use drugs.  Allergies:  Allergies  Allergen Reactions  . Shellfish Allergy Anaphylaxis  . Neurontin [Gabapentin] Other (See Comments)     Unable to talk when takes medication  . Vicodin [Hydrocodone-Acetaminophen] Itching  . Codeine Itching     itching  . Haldol [Haloperidol Lactate] Other (See Comments)    Muscle spasms  . Hydromorphone Hcl Nausea And Vomiting  . Latex Rash  . Propoxyphene N-Acetaminophen Nausea And Vomiting    Medications: No current facility-administered medications for this encounter.    Current Outpatient Medications  Medication Sig Dispense Refill  . ALPRAZolam (XANAX) 1 MG tablet Take 1 tablet (1 mg total) by mouth 4 (four) times daily. 120 tablet 2  . atorvastatin (LIPITOR) 10 MG tablet Take 10 mg by mouth at bedtime.  1  . buPROPion (WELLBUTRIN XL) 150 MG 24 hr tablet Take 1 tablet (150 mg total) by mouth every morning. 30 tablet 2  . Cholecalciferol (VITAMIN D3) 50000 units CAPS Take 1 capsule by mouth once a week.  0  . cholestyramine (QUESTRAN) 4 GM/DOSE powder TAKE 1 SCOOPFUL (4 G TOTAL) BY MOUTH DAILY. DO NOT TAKE WITHIN 2 HOURS OF OTHER MEDICATIONS 378 g 5  . FLUoxetine (PROZAC) 40 MG capsule Take 1 capsule (40 mg total) by mouth 2 (two) times daily. 60 capsule 2  . methocarbamol (ROBAXIN) 500 MG tablet Take 1.5 tablets (750 mg total) by mouth every 6 (six) hours as needed. 60 tablet 1  . methocarbamol (ROBAXIN) 750 MG tablet Take 750 mg by mouth daily as needed for muscle spasms.  0  . Multiple Vitamin (MULTIVITAMIN WITH MINERALS) TABS Take 1 tablet by mouth every evening.    . nitroGLYCERIN (NITROSTAT) 0.4 MG SL tablet Place 0.4 mg under the tongue every 5 (five) minutes as needed for chest pain.    . NON FORMULARY Vitamin for Hair, Nails and Skin    . OLANZapine (ZYPREXA) 20 MG tablet Take 1 tablet (20 mg total) by mouth at bedtime. 30 tablet 2  . oxybutynin (DITROPAN XL) 15 MG 24 hr tablet Take 15 mg by mouth daily.  11  . rOPINIRole (REQUIP) 3 MG tablet TAKE 1 TABLET BY MOUTH 3 TIMES DAILY.  11  . traZODone (DESYREL) 100 MG tablet Take one or two at bedtime 60 tablet 2    No results  found for this or any previous visit (from the past 48 hour(s)). No results found.  ROS: Pain with rom of the right upper extremity  Physical Exam:  Alert and oriented 71 y.o. female in no acute distress Cranial nerves 2-12 intact Cervical spine: full rom with no tenderness, nv intact distally Chest: active breath sounds bilaterally, no wheeze rhonchi or rales Heart: regular rate and rhythm, no murmur Abd: non tender non distended with active bowel sounds Hip is stable with rom  Right shoulder with limited rom due to pain nv intact distally No rashes or edema distally  Assessment/Plan Assessment: right shoulder pain s/p reverse total shoulder   Plan: Patient will undergo a right shoulder poly exchange by Dr. Veverly Fells at [facility]. Risks benefits and expectations were discussed with the  patient. Patient understand risks, benefits and expectations and wishes to proceed.

## 2017-08-17 ENCOUNTER — Encounter (HOSPITAL_COMMUNITY)
Admission: RE | Admit: 2017-08-17 | Discharge: 2017-08-17 | Disposition: A | Discharge status: Home / Self Care | Payer: Medicare Other | Source: Ambulatory Visit | Attending: Orthopedic Surgery | Admitting: Orthopedic Surgery

## 2017-08-17 ENCOUNTER — Other Ambulatory Visit: Payer: Self-pay

## 2017-08-17 ENCOUNTER — Encounter (HOSPITAL_COMMUNITY): Payer: Self-pay

## 2017-08-17 DIAGNOSIS — M1990 Unspecified osteoarthritis, unspecified site: Secondary | ICD-10-CM | POA: Diagnosis not present

## 2017-08-17 DIAGNOSIS — Z79899 Other long term (current) drug therapy: Secondary | ICD-10-CM | POA: Diagnosis not present

## 2017-08-17 DIAGNOSIS — F319 Bipolar disorder, unspecified: Secondary | ICD-10-CM | POA: Diagnosis not present

## 2017-08-17 DIAGNOSIS — D649 Anemia, unspecified: Secondary | ICD-10-CM | POA: Diagnosis not present

## 2017-08-17 DIAGNOSIS — M25211 Flail joint, right shoulder: Secondary | ICD-10-CM | POA: Diagnosis not present

## 2017-08-17 DIAGNOSIS — F419 Anxiety disorder, unspecified: Secondary | ICD-10-CM | POA: Diagnosis not present

## 2017-08-17 HISTORY — DX: Personal history of other diseases of the digestive system: Z87.19

## 2017-08-17 LAB — BASIC METABOLIC PANEL
ANION GAP: 6 (ref 5–15)
BUN: 9 mg/dL (ref 6–20)
CALCIUM: 9.4 mg/dL (ref 8.9–10.3)
CO2: 27 mmol/L (ref 22–32)
CREATININE: 0.69 mg/dL (ref 0.44–1.00)
Chloride: 107 mmol/L (ref 101–111)
GFR calc non Af Amer: >60 mL/min (ref >60)
Glucose, Bld: 92 mg/dL (ref 65–99)
Potassium: 4 mmol/L (ref 3.5–5.1)
SODIUM: 140 mmol/L (ref 135–145)

## 2017-08-17 LAB — CBC
HCT: 37.7 % (ref 36.0–46.0)
Hemoglobin: 12.1 g/dL (ref 12.0–15.0)
MCH: 30.6 pg (ref 26.0–34.0)
MCHC: 32.1 g/dL (ref 30.0–36.0)
MCV: 95.4 fL (ref 78.0–100.0)
PLATELETS: 188 10*3/uL (ref 150–400)
RBC: 3.95 MIL/uL (ref 3.87–5.11)
RDW: 13.4 % (ref 11.5–15.5)
WBC: 5.1 10*3/uL (ref 4.0–10.5)

## 2017-08-17 LAB — SURGICAL PCR SCREEN
MRSA, PCR: NEGATIVE
Staphylococcus aureus: NEGATIVE

## 2017-08-17 NOTE — Pre-Procedure Instructions (Signed)
Renelle Stegenga Wiberg  08/17/2017      [id number] [place] [place] [address] [place] [address] Phone: [phone] Fax: [phone]    Your procedure is scheduled on 08/19/17.  Report to [facility] Admitting at 530 A.M.  Call this number if you have problems the morning of surgery:  [phone]   Remember:  Do not eat food or drink liquids after midnight.  Take these medicines the morning of surgery with A SIP OF WATER     Alprazolam(xanax) if needed ,bupropion(wellbutrin),fluoxetine(prozac) ,olanzapine(zypexia), nitro if needed  STOP all herbel meds, nsaids (aleve,naproxen,advil,ibuprofen)  prior to surgery starting today 08/17/17 including all vitamins/supplements,aspirin meloxicam   Do not wear jewelry, make-up or nail polish.  Do not wear lotions, powders, or perfumes, or deoderant.  Do not shave 48 hours prior to surgery.  Men may shave face and neck.  Do not bring valuables to the hospital.  [facility] is not responsible for any belongings or valuables.  Contacts, dentures or bridgework may not be worn into surgery.  Leave your suitcase in the car.  After surgery it may be brought to your room.  For patients admitted to the hospital, discharge time will be determined by your treatment team.  Patients discharged the day of surgery will not be allowed to drive home.   Special instructions:   Special Instructions: [place] - Preparing for Surgery  Before surgery, you can play an important role.  Because skin is not sterile, your skin needs to be as free of germs as possible.  You can reduce the number of germs on you skin by washing with CHG (chlorahexidine gluconate) soap before surgery.  CHG is an antiseptic cleaner which kills germs and bonds with the skin to continue killing germs even after washing.  Please DO NOT use if you have an allergy to CHG or antibacterial soaps.  If your skin  becomes reddened/irritated stop using the CHG and inform your nurse when you arrive at Short Stay.  Do not shave (including legs and underarms) for at least 48 hours prior to the first CHG shower.  You may shave your face.  Please follow these instructions carefully:   1.  Shower with CHG Soap the night before surgery and the morning of Surgery.  2.  If you choose to wash your hair, wash your hair first as usual with your normal shampoo.  3.  After you shampoo, rinse your hair and body thoroughly to remove the Shampoo.  4.  Use CHG as you would any other liquid soap.  You can apply chg directly  to the skin and wash gently with scrungie or a clean washcloth.  5.  Apply the CHG Soap to your body ONLY FROM THE NECK DOWN.  Do not use on open wounds or open sores.  Avoid contact with your eyes ears, mouth and genitals (private parts).  Wash genitals (private parts)       with your normal soap.  6.  Wash thoroughly, paying special attention to the area where your surgery will be performed.  7.  Thoroughly rinse your body with warm water from the neck down.  8.  DO NOT shower/wash with your normal soap after using and rinsing off the CHG Soap.  9.  Pat yourself dry with a clean towel.            10.  Wear  clean pajamas.            11.  Place clean sheets on your bed the night of your first shower and do not sleep with pets.  Day of Surgery  Do not apply any lotions/deodorants the morning of surgery.  Please wear clean clothes to the hospital/surgery center.  Please read over the fact sheets that you were given.

## 2017-08-17 NOTE — Pre-Procedure Instructions (Signed)
Victoria Mccarthy  08/17/2017      [id number] [place] [place] [address] [place] [address] Phone: [phone] Fax: [phone]    Your procedure is scheduled on 08/19/17.  Report to [facility] Admitting at 530 A.M.  Call this number if you have problems the morning of surgery:  [phone]   Remember:  Do not eat food or drink liquids after midnight.  Take these medicines the morning of surgery with A SIP OF WATER     Alprazolam(xanax) if needed ,bupropion(wellbutrin),fluoxetine(prozac) ,olanzapine(zypexia), nitro if needed  STOP all herbel meds, nsaids (aleve,naproxen,advil,ibuprofen)  prior to surgery starting today 08/17/17 including all vitamins/supplements,aspirin   Do not wear jewelry, make-up or nail polish.  Do not wear lotions, powders, or perfumes, or deoderant.  Do not shave 48 hours prior to surgery.  Men may shave face and neck.  Do not bring valuables to the hospital.  [facility] is not responsible for any belongings or valuables.  Contacts, dentures or bridgework may not be worn into surgery.  Leave your suitcase in the car.  After surgery it may be brought to your room.  For patients admitted to the hospital, discharge time will be determined by your treatment team.  Patients discharged the day of surgery will not be allowed to drive home.   Special instructions:   Special Instructions: [place] - Preparing for Surgery  Before surgery, you can play an important role.  Because skin is not sterile, your skin needs to be as free of germs as possible.  You can reduce the number of germs on you skin by washing with CHG (chlorahexidine gluconate) soap before surgery.  CHG is an antiseptic cleaner which kills germs and bonds with the skin to continue killing germs even after washing.  Please DO NOT use if you have an allergy to CHG or antibacterial soaps.  If your skin becomes  reddened/irritated stop using the CHG and inform your nurse when you arrive at Short Stay.  Do not shave (including legs and underarms) for at least 48 hours prior to the first CHG shower.  You may shave your face.  Please follow these instructions carefully:   1.  Shower with CHG Soap the night before surgery and the morning of Surgery.  2.  If you choose to wash your hair, wash your hair first as usual with your normal shampoo.  3.  After you shampoo, rinse your hair and body thoroughly to remove the Shampoo.  4.  Use CHG as you would any other liquid soap.  You can apply chg directly  to the skin and wash gently with scrungie or a clean washcloth.  5.  Apply the CHG Soap to your body ONLY FROM THE NECK DOWN.  Do not use on open wounds or open sores.  Avoid contact with your eyes ears, mouth and genitals (private parts).  Wash genitals (private parts)       with your normal soap.  6.  Wash thoroughly, paying special attention to the area where your surgery will be performed.  7.  Thoroughly rinse your body with warm water from the neck down.  8.  DO NOT shower/wash with your normal soap after using and rinsing off the CHG Soap.  9.  Pat yourself dry with a clean towel.            10.  Wear clean  pajamas.            11.  Place clean sheets on your bed the night of your first shower and do not sleep with pets.  Day of Surgery  Do not apply any lotions/deodorants the morning of surgery.  Please wear clean clothes to the hospital/surgery center.  Please read over the fact sheets that you were given.

## 2017-08-17 NOTE — Pre-Procedure Instructions (Signed)
Victoria Mccarthy  08/17/2017      [id number] [place] [place] [address] [place] [address] Phone: [phone] Fax: [phone]    Your procedure is scheduled on 08/19/17.  Report to [facility] Admitting at 530 A.M.  Call this number if you have problems the morning of surgery:  [phone]   Remember:  Do not eat food or drink liquids after midnight.  Take these medicines the morning of surgery with A SIP OF WATER     Alprazolam(xanax) if needed ,bupropion(wellbutrin),fluoxetine(prozac) ,olanzapine(zypexia), nitro if needed  STOP all herbel meds, nsaids (aleve,naproxen,advil,ibuprofen)  prior to surgery starting today 08/17/17 including all vitamins/supplements,aspirin meloxicam   Do not wear jewelry, make-up or nail polish.  Do not wear lotions, powders, or perfumes, or deoderant.  Do not shave 48 hours prior to surgery.  Men may shave face and neck.  Do not bring valuables to the hospital.  [facility] is not responsible for any belongings or valuables.  Contacts, dentures or bridgework may not be worn into surgery.  Leave your suitcase in the car.  After surgery it may be brought to your room.  For patients admitted to the hospital, discharge time will be determined by your treatment team.  Patients discharged the day of surgery will not be allowed to drive home.   Special instructions:   Special Instructions: [place] - Preparing for Surgery  Before surgery, you can play an important role.  Because skin is not sterile, your skin needs to be as free of germs as possible.  You can reduce the number of germs on you skin by washing with CHG (chlorahexidine gluconate) soap before surgery.  CHG is an antiseptic cleaner which kills germs and bonds with the skin to continue killing germs even after washing.  Please DO NOT use if you have an allergy to CHG or antibacterial soaps.  If your skin  becomes reddened/irritated stop using the CHG and inform your nurse when you arrive at Short Stay.  Do not shave (including legs and underarms) for at least 48 hours prior to the first CHG shower.  You may shave your face.  Please follow these instructions carefully:   1.  Shower with CHG Soap the night before surgery and the morning of Surgery.  2.  If you choose to wash your hair, wash your hair first as usual with your normal shampoo.  3.  After you shampoo, rinse your hair and body thoroughly to remove the Shampoo.  4.  Use CHG as you would any other liquid soap.  You can apply chg directly  to the skin and wash gently with scrungie or a clean washcloth.  5.  Apply the CHG Soap to your body ONLY FROM THE NECK DOWN.  Do not use on open wounds or open sores.  Avoid contact with your eyes ears, mouth and genitals (private parts).  Wash genitals (private parts)       with your normal soap.  6.  Wash thoroughly, paying special attention to the area where your surgery will be performed.  7.  Thoroughly rinse your body with warm water from the neck down.  8.  DO NOT shower/wash with your normal soap after using and rinsing off the CHG Soap.  9.  Pat yourself dry with a clean towel.            10.  Wear  clean pajamas.            11.  Place clean sheets on your bed the night of your first shower and do not sleep with pets.  Day of Surgery  Do not apply any lotions/deodorants the morning of surgery.  Please wear clean clothes to the hospital/surgery center.  Please read over the fact sheets that you were given.

## 2017-08-18 NOTE — Progress Notes (Signed)
Anesthesia Chart Review:   Pt is a 71 year old female scheduled for R  Shoulder polyethylene exchange on 08/19/17 with Netta Cedars, MD.   - PCP is Collene Mares, PA at [facility]  PMH includes: MVP, rheumatic fever, heart murmur, anemia, bipolar disorder, psychotic affective disorder, renal cancer, anemia, GERD, post-op N/V. Never smoker. BMI 31.  - S/p R shoulder arthroplasty 05/28/16. S/p maximum access PLIF 09/11/15, 07/25/14 and 01/04/13. S/p laparoscopic L nephrectomy 08/23/11.   Medications include: lipitor, cholestyramine.   BP (!) 161/74   Pulse 84   Temp 36.9 C (Oral)   Resp 18   Ht 5\' 5"  (1.651 m)   Wt 187 lb 1 oz (84.9 kg)   SpO2 97%   BMI 31.13 kg/m   Preoperative labs reviewed.   EKG 08/17/17: NSR.   Cardiac cath 03/21/01:  1. No significant CAD (ramus intermedius had 50% proximal stenosis, CX 60% distal stenosis).  2. Normal LV systolic function. EF 50-55%  By notes on cath report, pt had an echo around 02/2001 that showed normal LV function with mildly thickened aortic valve.   Pt saw Dr. Wynonia Lawman with cardiology 09/08/15 for pre-op eval before 09/11/15 PLIF and he cleared pt for surgery at average risk without further testing. F/u prn was recommended.   Pt tolerated shoulder arthroplasty last year without issue. If no changes, I anticipate pt can proceed with surgery as scheduled.   Willeen Cass, FNP-BC [facility] Short Stay Surgical [facility]/Anesthesiology Phone: [phone] 08/18/2017 10:02 AM

## 2017-08-18 NOTE — Anesthesia Preprocedure Evaluation (Addendum)
Anesthesia Evaluation  Patient identified by MRN, date of birth, ID band Patient awake    Reviewed: Allergy & Precautions, H&P , NPO status , Patient's Chart, lab work & pertinent test results  History of Anesthesia Complications (+) PONV  Airway Mallampati: II  TM Distance: >3 FB Neck ROM: Full    Dental no notable dental hx. (+) Edentulous Upper, Edentulous Lower, Dental Advisory Given   Pulmonary neg pulmonary ROS,    Pulmonary exam normal breath sounds clear to auscultation       Cardiovascular Exercise Tolerance: Good + Valvular Problems/Murmurs MVP  Rhythm:Regular Rate:Normal     Neuro/Psych Anxiety Depression Bipolar Disorder Schizophrenia negative neurological ROS     GI/Hepatic Neg liver ROS, hiatal hernia,   Endo/Other  negative endocrine ROS  Renal/GU negative Renal ROS  negative genitourinary   Musculoskeletal  (+) Arthritis , Osteoarthritis,    Abdominal   Peds  Hematology negative hematology ROS (+)   Anesthesia Other Findings   Reproductive/Obstetrics negative OB ROS                            Anesthesia Physical Anesthesia Plan  ASA: II  Anesthesia Plan: General   Post-op Pain Management:  Regional for Post-op pain   Induction: Intravenous  PONV Risk Score and Plan: 4 or greater and Ondansetron, Dexamethasone and Midazolam  Airway Management Planned: Oral ETT  Additional Equipment:   Intra-op Plan:   Post-operative Plan: Extubation in OR  Informed Consent: I have reviewed the patients History and Physical, chart, labs and discussed the procedure including the risks, benefits and alternatives for the proposed anesthesia with the patient or authorized representative who has indicated his/her understanding and acceptance.     Plan Discussed with: CRNA and Surgeon  Anesthesia Plan Comments:        Anesthesia Quick Evaluation

## 2017-08-19 ENCOUNTER — Encounter (HOSPITAL_COMMUNITY)
Admission: RE | Disposition: A | Discharge status: Home / Self Care | Payer: Self-pay | Source: Ambulatory Visit | Attending: Orthopedic Surgery

## 2017-08-19 ENCOUNTER — Ambulatory Visit (HOSPITAL_COMMUNITY)
Admission: RE | Admit: 2017-08-19 | Discharge: 2017-08-19 | Disposition: A | Discharge status: Home / Self Care | Payer: Medicare Other | Source: Ambulatory Visit | Attending: Orthopedic Surgery | Admitting: Orthopedic Surgery

## 2017-08-19 ENCOUNTER — Encounter (HOSPITAL_COMMUNITY): Payer: Self-pay | Admitting: Anesthesiology

## 2017-08-19 ENCOUNTER — Inpatient Hospital Stay (HOSPITAL_COMMUNITY): Payer: Medicare Other | Admitting: Emergency Medicine

## 2017-08-19 ENCOUNTER — Inpatient Hospital Stay (HOSPITAL_COMMUNITY): Payer: Medicare Other | Admitting: Anesthesiology

## 2017-08-19 DIAGNOSIS — D649 Anemia, unspecified: Secondary | ICD-10-CM | POA: Insufficient documentation

## 2017-08-19 DIAGNOSIS — M25211 Flail joint, right shoulder: Secondary | ICD-10-CM | POA: Diagnosis not present

## 2017-08-19 DIAGNOSIS — Z79899 Other long term (current) drug therapy: Secondary | ICD-10-CM | POA: Diagnosis not present

## 2017-08-19 DIAGNOSIS — M24211 Disorder of ligament, right shoulder: Secondary | ICD-10-CM | POA: Diagnosis not present

## 2017-08-19 DIAGNOSIS — M1990 Unspecified osteoarthritis, unspecified site: Secondary | ICD-10-CM | POA: Diagnosis not present

## 2017-08-19 DIAGNOSIS — F319 Bipolar disorder, unspecified: Secondary | ICD-10-CM | POA: Insufficient documentation

## 2017-08-19 DIAGNOSIS — Z96611 Presence of right artificial shoulder joint: Secondary | ICD-10-CM | POA: Diagnosis not present

## 2017-08-19 DIAGNOSIS — T84038A Mechanical loosening of other internal prosthetic joint, initial encounter: Secondary | ICD-10-CM | POA: Diagnosis not present

## 2017-08-19 DIAGNOSIS — M545 Low back pain: Secondary | ICD-10-CM | POA: Diagnosis not present

## 2017-08-19 DIAGNOSIS — F419 Anxiety disorder, unspecified: Secondary | ICD-10-CM | POA: Insufficient documentation

## 2017-08-19 DIAGNOSIS — G8918 Other acute postprocedural pain: Secondary | ICD-10-CM | POA: Diagnosis not present

## 2017-08-19 DIAGNOSIS — R1310 Dysphagia, unspecified: Secondary | ICD-10-CM | POA: Diagnosis not present

## 2017-08-19 HISTORY — PX: TOTAL SHOULDER REVISION: SHX6130

## 2017-08-19 SURGERY — REVISION, TOTAL ARTHROPLASTY, SHOULDER
Anesthesia: General | Site: Shoulder | Laterality: Right

## 2017-08-19 MED ORDER — ONDANSETRON HCL 4 MG/2ML IJ SOLN
INTRAMUSCULAR | Status: AC
  Filled 2017-08-19: qty 2

## 2017-08-19 MED ORDER — ONDANSETRON HCL 4 MG/2ML IJ SOLN
INTRAMUSCULAR | Status: DC | PRN
  Administered 2017-08-19: 4 mg via INTRAVENOUS

## 2017-08-19 MED ORDER — PROPOFOL 10 MG/ML IV BOLUS
INTRAVENOUS | Status: AC
  Filled 2017-08-19: qty 20

## 2017-08-19 MED ORDER — LIDOCAINE 2% (20 MG/ML) 5 ML SYRINGE
INTRAMUSCULAR | Status: AC
  Filled 2017-08-19: qty 5

## 2017-08-19 MED ORDER — DEXAMETHASONE SODIUM PHOSPHATE 10 MG/ML IJ SOLN
INTRAMUSCULAR | Status: DC | PRN
  Administered 2017-08-19: 10 mg via INTRAVENOUS

## 2017-08-19 MED ORDER — 0.9 % SODIUM CHLORIDE (POUR BTL) OPTIME
TOPICAL | Status: DC | PRN
  Administered 2017-08-19: 1000 mL

## 2017-08-19 MED ORDER — MIDAZOLAM HCL 2 MG/2ML IJ SOLN
INTRAMUSCULAR | Status: AC
  Filled 2017-08-19: qty 2

## 2017-08-19 MED ORDER — PHENYLEPHRINE HCL 10 MG/ML IJ SOLN
INTRAVENOUS | Status: DC | PRN
  Administered 2017-08-19: 25 ug/min via INTRAVENOUS

## 2017-08-19 MED ORDER — PROPOFOL 10 MG/ML IV BOLUS
INTRAVENOUS | Status: DC | PRN
Start: 2017-08-19 — End: 2017-08-19
  Administered 2017-08-19: 110 mg via INTRAVENOUS

## 2017-08-19 MED ORDER — LACTATED RINGERS IV SOLN
INTRAVENOUS | Status: DC | PRN
  Administered 2017-08-19: 07:00:00 via INTRAVENOUS

## 2017-08-19 MED ORDER — BUPIVACAINE-EPINEPHRINE 0.25% -1:200000 IJ SOLN
INTRAMUSCULAR | Status: DC | PRN
  Administered 2017-08-19: 7 mL

## 2017-08-19 MED ORDER — SUGAMMADEX SODIUM 200 MG/2ML IV SOLN
INTRAVENOUS | Status: AC
  Filled 2017-08-19: qty 2

## 2017-08-19 MED ORDER — OXYCODONE-ACETAMINOPHEN 5-325 MG PO TABS: 1.0000 | ORAL_TABLET | ORAL | 0 refills | Status: DC | PRN

## 2017-08-19 MED ORDER — HYDROMORPHONE HCL 1 MG/ML IJ SOLN: 0.2500 mg | INTRAMUSCULAR | Status: DC | PRN

## 2017-08-19 MED ORDER — CHLORHEXIDINE GLUCONATE 4 % EX LIQD: 60.0000 mL | Freq: Once | CUTANEOUS | Status: DC

## 2017-08-19 MED ORDER — CEFAZOLIN SODIUM-DEXTROSE 2-4 GM/100ML-% IV SOLN
2.0000 g | INTRAVENOUS | Status: AC
  Administered 2017-08-19: 2 g via INTRAVENOUS

## 2017-08-19 MED ORDER — FENTANYL CITRATE (PF) 250 MCG/5ML IJ SOLN
INTRAMUSCULAR | Status: AC
  Filled 2017-08-19: qty 5

## 2017-08-19 MED ORDER — CHLORHEXIDINE GLUCONATE 4 % EX LIQD: 60.0000 mL | Freq: Once | CUTANEOUS | 0 refills | Status: AC

## 2017-08-19 MED ORDER — FENTANYL CITRATE (PF) 100 MCG/2ML IJ SOLN
INTRAMUSCULAR | Status: DC | PRN
  Administered 2017-08-19: 50 ug via INTRAVENOUS

## 2017-08-19 MED ORDER — SUGAMMADEX SODIUM 200 MG/2ML IV SOLN
INTRAVENOUS | Status: DC | PRN
  Administered 2017-08-19: 200 mg via INTRAVENOUS

## 2017-08-19 MED ORDER — BUPIVACAINE-EPINEPHRINE (PF) 0.5% -1:200000 IJ SOLN
INTRAMUSCULAR | Status: DC | PRN
  Administered 2017-08-19: 30 mL via PERINEURAL

## 2017-08-19 MED ORDER — MIDAZOLAM HCL 5 MG/5ML IJ SOLN
INTRAMUSCULAR | Status: DC | PRN
Start: 2017-08-19 — End: 2017-08-19
  Administered 2017-08-19: 2 mg via INTRAVENOUS

## 2017-08-19 MED ORDER — CEFAZOLIN SODIUM-DEXTROSE 2-4 GM/100ML-% IV SOLN
INTRAVENOUS | Status: AC
  Filled 2017-08-19: qty 100

## 2017-08-19 MED ORDER — BUPIVACAINE-EPINEPHRINE (PF) 0.25% -1:200000 IJ SOLN
INTRAMUSCULAR | Status: AC
  Filled 2017-08-19: qty 30

## 2017-08-19 MED ORDER — DEXAMETHASONE SODIUM PHOSPHATE 10 MG/ML IJ SOLN
INTRAMUSCULAR | Status: AC
  Filled 2017-08-19: qty 1

## 2017-08-19 MED ORDER — LIDOCAINE HCL (CARDIAC) 20 MG/ML IV SOLN
INTRAVENOUS | Status: DC | PRN
  Administered 2017-08-19: 60 mg via INTRAVENOUS

## 2017-08-19 MED ORDER — ROCURONIUM BROMIDE 10 MG/ML (PF) SYRINGE
PREFILLED_SYRINGE | INTRAVENOUS | Status: AC
  Filled 2017-08-19: qty 5

## 2017-08-19 MED ORDER — ROCURONIUM BROMIDE 100 MG/10ML IV SOLN
INTRAVENOUS | Status: DC | PRN
  Administered 2017-08-19: 30 mg via INTRAVENOUS

## 2017-08-19 SURGICAL SUPPLY — 75 items
BOWL SMART MIX CTS (DISPOSABLE) IMPLANT
BRUSH FEMORAL CANAL (MISCELLANEOUS) IMPLANT
BUR SURG 4X8 MED (BURR) IMPLANT
BURR SURG 4X8 MED (BURR)
COVER SURGICAL LIGHT HANDLE (MISCELLANEOUS) ×2 IMPLANT
DRAPE INCISE IOBAN 66X45 STRL (DRAPES) ×4 IMPLANT
DRAPE ORTHO SPLIT 77X108 STRL (DRAPES) ×4
DRAPE SURG ORHT 6 SPLT 77X108 (DRAPES) ×2 IMPLANT
DRAPE U-SHAPE 47X51 STRL (DRAPES) ×2 IMPLANT
DRAPE X-RAY CASS 24X20 (DRAPES) IMPLANT
DRILL BIT 5/64 (BIT) IMPLANT
DRSG ADAPTIC 3X8 NADH LF (GAUZE/BANDAGES/DRESSINGS) ×2 IMPLANT
DRSG PAD ABDOMINAL 8X10 ST (GAUZE/BANDAGES/DRESSINGS) ×4 IMPLANT
DURAPREP 26ML APPLICATOR (WOUND CARE) ×2 IMPLANT
ELECT BLADE 4.0 EZ CLEAN MEGAD (MISCELLANEOUS) ×2
ELECT NDL TIP 2.8 STRL (NEEDLE) ×1 IMPLANT
ELECT NEEDLE TIP 2.8 STRL (NEEDLE) IMPLANT
ELECT REM PT RETURN 9FT ADLT (ELECTROSURGICAL) ×2
ELECTRODE BLDE 4.0 EZ CLN MEGD (MISCELLANEOUS) ×1 IMPLANT
ELECTRODE REM PT RTRN 9FT ADLT (ELECTROSURGICAL) ×1 IMPLANT
GAUZE SPONGE 4X4 12PLY STRL (GAUZE/BANDAGES/DRESSINGS) ×2 IMPLANT
GLOVE BIOGEL PI ORTHO PRO 7.5 (GLOVE) ×1
GLOVE BIOGEL PI ORTHO PRO SZ8 (GLOVE) ×1
GLOVE ORTHO TXT STRL SZ7.5 (GLOVE) ×2 IMPLANT
GLOVE PI ORTHO PRO STRL 7.5 (GLOVE) ×2 IMPLANT
GLOVE PI ORTHO PRO STRL SZ8 (GLOVE) ×1 IMPLANT
GLOVE SURG ORTHO 8.5 STRL (GLOVE) ×2 IMPLANT
GOWN STRL REUS W/ TWL LRG LVL3 (GOWN DISPOSABLE) ×1 IMPLANT
GOWN STRL REUS W/ TWL XL LVL3 (GOWN DISPOSABLE) ×4 IMPLANT
GOWN STRL REUS W/TWL LRG LVL3 (GOWN DISPOSABLE) ×2
GOWN STRL REUS W/TWL XL LVL3 (GOWN DISPOSABLE) ×4
HANDPIECE INTERPULSE COAX TIP (DISPOSABLE)
HUMERAL SPACER (Trauma) ×2 IMPLANT
KIT BASIN OR (CUSTOM PROCEDURE TRAY) ×2 IMPLANT
KIT ROOM TURNOVER OR (KITS) ×2 IMPLANT
MANIFOLD NEPTUNE II (INSTRUMENTS) ×2 IMPLANT
NDL 1/2 CIR MAYO (NEEDLE) IMPLANT
NDL HYPO 25GX1X1/2 BEV (NEEDLE) ×1 IMPLANT
NDL SUT 6 .5 CRC .975X.05 MAYO (NEEDLE) ×1 IMPLANT
NEEDLE 1/2 CIR MAYO (NEEDLE) IMPLANT
NEEDLE HYPO 25GX1X1/2 BEV (NEEDLE) ×2 IMPLANT
NEEDLE MAYO TAPER (NEEDLE)
NS IRRIG 1000ML POUR BTL (IV SOLUTION) ×2 IMPLANT
PACK SHOULDER (CUSTOM PROCEDURE TRAY) ×2 IMPLANT
PAD ARMBOARD 7.5X6 YLW CONV (MISCELLANEOUS) ×4 IMPLANT
SET HNDPC FAN SPRY TIP SCT (DISPOSABLE) IMPLANT
SLING ARM IMMOBILIZER LRG (SOFTGOODS) ×2 IMPLANT
SLING ARM IMMOBILIZER MED (SOFTGOODS) IMPLANT
SPACER HUMERAL (Trauma) IMPLANT
SPACER STAND PE CUP D38 PLUS 9 (Orthopedic Implant) ×2 IMPLANT
SPACER STD PE CUP D38 PLUS 9 (Orthopedic Implant) IMPLANT
SPONGE LAP 18X18 X RAY DECT (DISPOSABLE) ×2 IMPLANT
SPONGE LAP 4X18 X RAY DECT (DISPOSABLE) ×2 IMPLANT
STRIP CLOSURE SKIN 1/2X4 (GAUZE/BANDAGES/DRESSINGS) ×2 IMPLANT
SUCTION FRAZIER HANDLE 10FR (MISCELLANEOUS) ×1
SUCTION TUBE FRAZIER 10FR DISP (MISCELLANEOUS) ×1 IMPLANT
SUT FIBERWIRE #2 38 T-5 BLUE (SUTURE) ×6
SUT MNCRL AB 4-0 PS2 18 (SUTURE) ×2 IMPLANT
SUT VIC AB 0 CT1 27 (SUTURE) ×2
SUT VIC AB 0 CT1 27XBRD ANBCTR (SUTURE) ×1 IMPLANT
SUT VIC AB 2-0 CT1 27 (SUTURE) ×2
SUT VIC AB 2-0 CT1 TAPERPNT 27 (SUTURE) ×1 IMPLANT
SUT VICRYL AB 2 0 TIES (SUTURE) ×1 IMPLANT
SUTURE FIBERWR #2 38 T-5 BLUE (SUTURE) ×5 IMPLANT
SWAB COLLECTION DEVICE MRSA (MISCELLANEOUS) ×1 IMPLANT
SWAB CULTURE ESWAB REG 1ML (MISCELLANEOUS) ×1 IMPLANT
SYR CONTROL 10ML LL (SYRINGE) ×2 IMPLANT
SYR TOOMEY 50ML (SYRINGE) ×1 IMPLANT
TAPE CLOTH SURG 6X10 WHT LF (GAUZE/BANDAGES/DRESSINGS) ×1 IMPLANT
TOWEL OR 17X24 6PK STRL BLUE (TOWEL DISPOSABLE) ×1 IMPLANT
TOWEL OR 17X26 10 PK STRL BLUE (TOWEL DISPOSABLE) ×2 IMPLANT
TOWER CARTRIDGE SMART MIX (DISPOSABLE) IMPLANT
TRAY FOLEY W/METER SILVER 16FR (SET/KITS/TRAYS/PACK) ×1 IMPLANT
WATER STERILE IRR 1000ML POUR (IV SOLUTION) ×1 IMPLANT
YANKAUER SUCT BULB TIP NO VENT (SUCTIONS) ×1 IMPLANT

## 2017-08-19 NOTE — Interval H&P Note (Signed)
History and Physical Interval Note:  08/19/2017 7:30 AM  Victoria Mccarthy  has presented today for surgery, with the diagnosis of Right shoulder laxity after total shoulder arthroplasty   The various methods of treatment have been discussed with the patient and family. After consideration of risks, benefits and other options for treatment, the patient has consented to  Procedure(s): RIGHT SHOULDER POLYETHYLENE EXCHANGE (Right) as a surgical intervention .  The patient's history has been reviewed, patient examined, no change in status, stable for surgery.  I have reviewed the patient's chart and labs.  Questions were answered to the patient's satisfaction.     Lacey Dotson,STEVEN R

## 2017-08-19 NOTE — Anesthesia Procedure Notes (Signed)
Procedure Name: Intubation Date/Time: 08/19/2017 7:41 AM Performed by: Kyung Rudd, CRNA Pre-anesthesia Checklist: Patient identified, Emergency Drugs available, Suction available and Patient being monitored Patient Re-evaluated:Patient Re-evaluated prior to induction Oxygen Delivery Method: Circle system utilized Preoxygenation: Pre-oxygenation with 100% oxygen Induction Type: IV induction Ventilation: Mask ventilation without difficulty Laryngoscope Size: Mac and 3 Grade View: Grade I Tube type: Oral Tube size: 7.0 mm Number of attempts: 1 Airway Equipment and Method: Stylet Placement Confirmation: ETT inserted through vocal cords under direct vision,  positive ETCO2 and breath sounds checked- equal and bilateral Secured at: 21 cm Tube secured with: Tape Dental Injury: Teeth and Oropharynx as per pre-operative assessment

## 2017-08-19 NOTE — Brief Op Note (Signed)
08/19/2017  8:47 AM  PATIENT:  Victoria Mccarthy  71 y.o. female  PRE-OPERATIVE DIAGNOSIS:  Right shoulder laxity after total shoulder arthroplasty   POST-OPERATIVE DIAGNOSIS:  Right shoulder laxity after total shoulder arthroplasty   PROCEDURE:  Procedure(s): RIGHT SHOULDER POLYETHYLENE EXCHANGE (Right)   SURGEON:  Surgeon(s) and Role:    Netta Cedars, MD - Primary  PHYSICIAN ASSISTANT:   ASSISTANTS: Ventura Bruns, PA-C   ANESTHESIA:   regional and general  EBL:  Minimal   BLOOD ADMINISTERED:none  DRAINS: none   LOCAL MEDICATIONS USED:  MARCAINE     SPECIMEN:  Source of Specimen:  right shoulder fluid  DISPOSITION OF SPECIMEN:  micro  COUNTS:  YES  TOURNIQUET:  * No tourniquets in log *  DICTATION: .Other Dictation: Dictation Number [id number]  PLAN OF CARE: Discharge to home after PACU  PATIENT DISPOSITION:  PACU - hemodynamically stable.   Delay start of Pharmacological VTE agent (>24hrs) due to surgical blood loss or risk of bleeding: not applicable

## 2017-08-19 NOTE — Discharge Instructions (Signed)
Ice to the shoulder as much as you can. Ok to remove the sling and use the arm as tolerated.  Keep a pillow propped behind the elbow to keep the arm across the waist.  Keep the shoulder wound covered and dry for one week, then ok to get it wet in the shower   Follow up in two weeks in the office [phone]

## 2017-08-19 NOTE — Anesthesia Procedure Notes (Signed)
Anesthesia Regional Block: Interscalene brachial plexus block   Pre-Anesthetic Checklist: ,, timeout performed, Correct Patient, Correct Site, Correct Laterality, Correct Procedure, Correct Position, site marked, Risks and benefits discussed, pre-op evaluation,  At surgeon's request and post-op pain management  Laterality: Right  Prep: Maximum Sterile Barrier Precautions used, chloraprep       Needles:  Injection technique: Single-shot  Needle Type: Echogenic Stimulator Needle     Needle Length: 5cm  Needle Gauge: 22     Additional Needles:   Procedures:,,,, ultrasound used (permanent image in chart),,,,   Nerve Stimulator or Paresthesia:  Response: Biceps response,   Additional Responses:   Narrative:  Start time: 08/19/2017 7:05 AM End time: 08/19/2017 7:15 AM Injection made incrementally with aspirations every 5 mL. Anesthesiologist: Roderic Palau, MD  Additional Notes: 2% Lidocaine skin wheel.

## 2017-08-19 NOTE — Anesthesia Postprocedure Evaluation (Signed)
Anesthesia Post Note  Patient: Victoria Mccarthy  Procedure(s) Performed: RIGHT SHOULDER POLYETHYLENE EXCHANGE (Right Shoulder)     Patient location during evaluation: PACU Anesthesia Type: General and Regional Level of consciousness: awake and alert Pain management: pain level controlled Vital Signs Assessment: post-procedure vital signs reviewed and stable Respiratory status: spontaneous breathing, nonlabored ventilation and respiratory function stable Cardiovascular status: blood pressure returned to baseline and stable Postop Assessment: no apparent nausea or vomiting Anesthetic complications: no    Last Vitals:  Vitals:   08/19/17 1030 08/19/17 1034  BP: (!) 156/76 (!) 156/93  Pulse: 83 85  Resp: 20 17  Temp: (!) 36.4 C   SpO2: 93% 94%    Last Pain:  Vitals:   08/19/17 0855  TempSrc:   PainSc: 0-No pain                 Irys Nigh,W. EDMOND

## 2017-08-19 NOTE — Transfer of Care (Signed)
Immediate Anesthesia Transfer of Care Note  Patient: Victoria Mccarthy  Procedure(s) Performed: RIGHT SHOULDER POLYETHYLENE EXCHANGE (Right Shoulder)  Patient Location: PACU  Anesthesia Type:GA combined with regional for post-op pain  Level of Consciousness: awake, alert  and oriented  Airway & Oxygen Therapy: Patient Spontanous Breathing and Patient connected to nasal cannula oxygen  Post-op Assessment: Report given to RN and Post -op Vital signs reviewed and stable  Post vital signs: Reviewed and stable  Last Vitals:  Vitals:   08/19/17 0554  BP: (!) 142/53  Pulse: 71  Resp: 20  Temp: 36.6 C  SpO2: 95%    Last Pain:  Vitals:   08/19/17 0554  TempSrc: Oral         Complications: No apparent anesthesia complications

## 2017-08-21 ENCOUNTER — Encounter (HOSPITAL_COMMUNITY): Payer: Self-pay | Admitting: Orthopedic Surgery

## 2017-08-22 NOTE — Op Note (Signed)
Victoria Mccarthy, Victoria Mccarthy               ACCOUNT NO.:  [id number]  MEDICAL RECORD NO.:  [id number]  LOCATION:                                FACILITY:  [facility]  PHYSICIAN:  Doran Heater. Veverly Fells, M.D. DATE OF BIRTH:  1945-12-02  DATE OF PROCEDURE:  08/19/2017 DATE OF DISCHARGE:  08/19/2017                              OPERATIVE REPORT   PREOPERATIVE DIAGNOSIS:  Right shoulder laxity after reverse shoulder arthroplasty.  POSTOPERATIVE DIAGNOSIS:  Right shoulder laxity after reverse shoulder arthroplasty.  PROCEDURE PERFORMED:  Right shoulder polyethylene exchange for reverse shoulder replacement (+9 mm).  ATTENDING SURGEON:  Doran Heater. Veverly Fells, MD.  ASSISTANT:  Darol Destine, [facility], who was scrubbed during the entire procedure and necessary for satisfactory completion of surgery.  ANESTHESIA:  General anesthesia was used plus local plus interscalene block.  ESTIMATED BLOOD LOSS:  Minimal.  FLUID REPLACEMENT:  1000 mL of crystalloid.  INSTRUMENT COUNTS:  Correct.  COMPLICATIONS:  There were no complications.  Perioperative antibiotics were given.  INDICATIONS:  The patient is a 71 year old female, who presents over a year status post right shoulder reverse arthroplasty, complaining of clicking and popping in the shoulder and a feeling of instability. Clinically, the patient did feel to have a lax reverse shoulder replacement.  The patient did fail an extended period of conservative management for greater than 6 months.  The patient desires revision shoulder replacement to exchange her polyethylene for additional length on the humeral side to tighten her shoulder up.  Risks and benefits of surgery were discussed.  Informed consent was obtained.  DESCRIPTION OF PROCEDURE:  After an adequate level of anesthesia was achieved, the patient was positioned in a modified beach-chair position. Right shoulder was sterilely prepped and draped in usual manner.  Time- out was called.  I did  examine the patient's shoulder prior to the skin incision and noted there to be some slight clicking and popping, but no feeling that I could perch it or dislocate it.  Following our exam, we verified correct patient and correct site, initiated surgery through the patient's prior deltopectoral incision with a 10 blade scalpel, dissection down through the subcutaneous tissues using the needle-tip Bovie.  Identified the deltopectoral interval and developed that using the Bovie and a Cobb elevator and scissors.  We were able to get down to the conjoined tendon, identified that, retracted that medially and then get up onto the deltoid and reflected off the humerus.  We were then able to window the pseudocapsule anteriorly and then begin working to free the humerus from scar tissue.  Normal-appearing joint fluid was expressed from the joint.  I went ahead and sent that for Gram stain, aerobic, anaerobic cultures just in case, but there was no evidence of infectious process in the shoulder.  We then carefully freed up around the glenosphere to make sure that was nice and stable.  The humeral side had a 9-mm poly.  I could distract the shoulder just with longitudinal traction and noted to be of least a 6-mm gap there as well.  When we externally rotated the shoulder, there was significant gapping anteriorly.  Thus, the shoulder was definitely lax and we then  went ahead and dislocated the shoulder, removed the polyethylene component from the humeral side, attached the handle for the insertion of the humeral stem and went ahead and tapped that several times to make sure that the stem was stable, which it was.  We then trialed with a +9 metal spacer followed by first a 38/+6 poly trial and reduced the shoulder and that was dramatically more stable, still a tiny bit of gapping with an inferior pole, but it did eliminate the external rotation gapping.  So, we felt like we could definitely get a +9 in  and so we re-dislocated, re- verified that everything was stable and there was no bony impingement or soft tissue impingement and then irrigated one final time and then impacted the real +9 metal shim on the humeral side and then selected a 38/+9 polyethylene and impacted that in place.  Once that was in, then we reduced the shoulder and there was nice little pop.  We had it reduced, definitely had eliminated the instability.  We could now pull on the humerus and everything moved together as a unit.  No gapping with external rotation.  We did not feel like the axillary nerve was under undue tension.  Conjoined tendon was tensioned somewhat, but not overly tensioned.  We then irrigated it thoroughly and then closed the deltopectoral interval with interrupted 0 Vicryl suture followed by 2-0 Vicryl for subcutaneous closure and 4-0 Monocryl for skin.  Steri-Strips applied followed by a sterile dressing.  The patient tolerated the surgery well.     Doran Heater. Veverly Fells, M.D.     SRN/MEDQ  D:  08/19/2017  T:  08/20/2017  Job:  [id number]

## 2017-08-24 LAB — AEROBIC/ANAEROBIC CULTURE (SURGICAL/DEEP WOUND): CULTURE: NO GROWTH

## 2017-08-24 LAB — AEROBIC/ANAEROBIC CULTURE W GRAM STAIN (SURGICAL/DEEP WOUND)

## 2017-08-30 ENCOUNTER — Ambulatory Visit (HOSPITAL_COMMUNITY): Payer: Self-pay | Admitting: Licensed Clinical Social Worker

## 2017-08-31 ENCOUNTER — Other Ambulatory Visit (HOSPITAL_COMMUNITY): Payer: Self-pay

## 2017-08-31 ENCOUNTER — Other Ambulatory Visit (HOSPITAL_COMMUNITY): Payer: Self-pay | Admitting: Psychiatry

## 2017-08-31 ENCOUNTER — Encounter (HOSPITAL_COMMUNITY): Payer: Self-pay

## 2017-08-31 ENCOUNTER — Ambulatory Visit (HOSPITAL_COMMUNITY): Payer: Self-pay

## 2017-09-05 DIAGNOSIS — Z96611 Presence of right artificial shoulder joint: Secondary | ICD-10-CM | POA: Diagnosis not present

## 2017-09-05 DIAGNOSIS — Z471 Aftercare following joint replacement surgery: Secondary | ICD-10-CM | POA: Diagnosis not present

## 2017-09-08 ENCOUNTER — Other Ambulatory Visit: Payer: Self-pay | Admitting: Neurological Surgery

## 2017-09-26 ENCOUNTER — Other Ambulatory Visit (HOSPITAL_COMMUNITY): Payer: Self-pay

## 2017-09-26 NOTE — Pre-Procedure Instructions (Addendum)
    Victoria Mccarthy  09/26/2017      [id number] - [place] [place] [address] [place] [address] Phone: [phone] Fax: [phone]    Your procedure is scheduled on  Wed. Jan. 9  Report to [facility] Admitting at 6:30 A.M.  Call this number if you have problems the morning of surgery:  [phone]   Remember:  Do not eat food or drink liquids after midnight on Tues. Jan. 8   Take these medicines the morning of surgery with A SIP OF WATER : alprazolam (xanax), bupropion (wellbutrin), fluoxetine (prozac), nitroglycerine if needed, oxybutynin (ditropan) oxycodone if needed, requip (ropinirole)             7 days prior to surgery STOP taking any Aspirin(unless otherwise instructed by your surgeon), Aleve, Naproxen, Ibuprofen, Motrin, Advil, Goody's, BC's, all herbal medications, fish oil, and all vitamins    Do not wear jewelry, make-up or nail polish.  Do not wear lotions, powders, or perfumes, or deodorant.  Do not shave 48 hours prior to surgery.  Men may shave face and neck.  Do not bring valuables to the hospital.  [facility] is not responsible for any belongings or valuables.  Contacts, dentures or bridgework may not be worn into surgery.  Leave your suitcase in the car.  After surgery it may be brought to your room.  For patients admitted to the hospital, discharge time will be determined by your treatment team.  Patients discharged the day of surgery will not be allowed to drive home.     Please read over the following fact sheets that you were given. Coughing and Deep Breathing and Surgical Site Infection Prevention

## 2017-09-27 ENCOUNTER — Encounter (HOSPITAL_COMMUNITY): Payer: Self-pay

## 2017-09-27 ENCOUNTER — Other Ambulatory Visit: Payer: Self-pay

## 2017-09-27 ENCOUNTER — Encounter (HOSPITAL_COMMUNITY)
Admission: RE | Admit: 2017-09-27 | Discharge: 2017-09-27 | Disposition: A | Discharge status: Home / Self Care | Payer: Medicare Other | Source: Ambulatory Visit | Attending: Neurological Surgery | Admitting: Neurological Surgery

## 2017-09-27 LAB — TYPE AND SCREEN
ABO/RH(D): O POS
ANTIBODY SCREEN: NEGATIVE

## 2017-09-27 LAB — CBC
HCT: 38.3 % (ref 36.0–46.0)
Hemoglobin: 12.4 g/dL (ref 12.0–15.0)
MCH: 30.2 pg (ref 26.0–34.0)
MCHC: 32.4 g/dL (ref 30.0–36.0)
MCV: 93.4 fL (ref 78.0–100.0)
Platelets: 201 10*3/uL (ref 150–400)
RBC: 4.1 MIL/uL (ref 3.87–5.11)
RDW: 13 % (ref 11.5–15.5)
WBC: 5.3 10*3/uL (ref 4.0–10.5)

## 2017-09-27 LAB — SURGICAL PCR SCREEN
MRSA, PCR: NEGATIVE
STAPHYLOCOCCUS AUREUS: NEGATIVE

## 2017-09-27 NOTE — Anesthesia Preprocedure Evaluation (Addendum)
Anesthesia Evaluation  Patient identified by MRN, date of birth, ID band Patient awake    Reviewed: Allergy & Precautions, H&P , NPO status , Patient's Chart, lab work & pertinent test results  History of Anesthesia Complications (+) PONV  Airway Mallampati: II  TM Distance: >3 FB Neck ROM: Full    Dental no notable dental hx. (+) Edentulous Upper, Edentulous Lower, Dental Advisory Given   Pulmonary neg pulmonary ROS,    Pulmonary exam normal breath sounds clear to auscultation       Cardiovascular Exercise Tolerance: Good + Valvular Problems/Murmurs MVP  Rhythm:Regular Rate:Normal     Neuro/Psych Anxiety Depression Bipolar Disorder Schizophrenia negative neurological ROS     GI/Hepatic Neg liver ROS, hiatal hernia,   Endo/Other  negative endocrine ROS  Renal/GU negative Renal ROS  negative genitourinary   Musculoskeletal  (+) Arthritis , Rheumatoid disorders,    Abdominal   Peds  Hematology negative hematology ROS (+)   Anesthesia Other Findings   Reproductive/Obstetrics negative OB ROS                            Anesthesia Physical Anesthesia Plan  ASA: II  Anesthesia Plan: General   Post-op Pain Management:    Induction: Intravenous  PONV Risk Score and Plan: 4 or greater and Ondansetron, Dexamethasone and Midazolam  Airway Management Planned: Oral ETT  Additional Equipment:   Intra-op Plan:   Post-operative Plan: Extubation in OR  Informed Consent: I have reviewed the patients History and Physical, chart, labs and discussed the procedure including the risks, benefits and alternatives for the proposed anesthesia with the patient or authorized representative who has indicated his/her understanding and acceptance.   Dental advisory given  Plan Discussed with: CRNA  Anesthesia Plan Comments:        Anesthesia Quick Evaluation

## 2017-09-27 NOTE — Progress Notes (Signed)
PCP: Dr. Collene Mares @ [facility] in [place]

## 2017-09-27 NOTE — Progress Notes (Signed)
Anesthesia Chart Review:  Pt is a 72 year old female scheduled for C4-5, C5-6, C6-7 ACDF on 09/28/2017 with Sherley Bounds, MD.  - PCP is Collene Mares, PA at [facility]  PMH includes: MVP, rheumatic fever, heart murmur, anemia, bipolar disorder, psychotic affective disorder, renal cancer, anemia, GERD, post-op N/V. Never smoker. BMI 31. - S/p R shoulder poly exchange 08/19/17. S/p R shoulder arthroplasty 05/28/16. S/p maximum access PLIF 09/11/15,07/25/14 and 01/04/13. S/p laparoscopic L nephrectomy 08/23/11.   Medications include: lipitor, cholestyramine.   BP (!) 146/81   Pulse 92   Temp 37.2 C (Oral)   Resp 20   Ht 5\' 5"  (1.651 m)   Wt 184 lb 9.6 oz (83.7 kg)   SpO2 99%   BMI 30.72 kg/m    Preoperative labs reviewed.   EKG 08/17/17: NSR.   Cardiac cath 03/21/01:  1. No significant CAD (ramus intermedius had 50% proximal stenosis, CX 60% distal stenosis).  2. Normal LV systolic function. EF 50-55%  By notes on cath report, pt had an echo around 02/2001 that showed normal LV function with mildly thickened aortic valve.   Pt saw Dr. Wynonia Lawman with cardiology 09/08/15 for pre-op evalbefore 09/11/15 PLIF and he cleared pt for surgery at average riskwithout further testing. F/u prn was recommended.  Pt tolerated recent shoulder poly exchange without issue.If no changes, I anticipate pt can proceed with surgery as scheduled.  Willeen Cass, FNP-BC [facility]/Anesthesiology Phone: [phone] 09/27/2017 4:32 PM

## 2017-09-28 ENCOUNTER — Encounter (HOSPITAL_COMMUNITY)
Admission: RE | Disposition: A | Discharge status: Home / Self Care | Payer: Self-pay | Source: Ambulatory Visit | Attending: Neurological Surgery

## 2017-09-28 ENCOUNTER — Inpatient Hospital Stay (HOSPITAL_COMMUNITY): Payer: Medicare Other

## 2017-09-28 ENCOUNTER — Inpatient Hospital Stay (HOSPITAL_COMMUNITY): Payer: Medicare Other | Admitting: Emergency Medicine

## 2017-09-28 ENCOUNTER — Inpatient Hospital Stay (HOSPITAL_COMMUNITY)
Admission: RE | Admit: 2017-09-28 | Discharge: 2017-09-29 | DRG: 473 | Disposition: A | Discharge status: Home / Self Care | Payer: Medicare Other | Source: Ambulatory Visit | Attending: Neurological Surgery | Admitting: Neurological Surgery

## 2017-09-28 ENCOUNTER — Inpatient Hospital Stay (HOSPITAL_COMMUNITY): Payer: Medicare Other | Admitting: Anesthesiology

## 2017-09-28 ENCOUNTER — Other Ambulatory Visit (HOSPITAL_COMMUNITY): Payer: Self-pay | Admitting: Psychiatry

## 2017-09-28 ENCOUNTER — Encounter (HOSPITAL_COMMUNITY): Payer: Self-pay | Admitting: General Practice

## 2017-09-28 DIAGNOSIS — M2578 Osteophyte, vertebrae: Secondary | ICD-10-CM | POA: Diagnosis present

## 2017-09-28 DIAGNOSIS — G629 Polyneuropathy, unspecified: Secondary | ICD-10-CM | POA: Diagnosis not present

## 2017-09-28 DIAGNOSIS — M50223 Other cervical disc displacement at C6-C7 level: Secondary | ICD-10-CM | POA: Diagnosis present

## 2017-09-28 DIAGNOSIS — M542 Cervicalgia: Secondary | ICD-10-CM | POA: Diagnosis not present

## 2017-09-28 DIAGNOSIS — M25519 Pain in unspecified shoulder: Secondary | ICD-10-CM | POA: Diagnosis not present

## 2017-09-28 DIAGNOSIS — F17220 Nicotine dependence, chewing tobacco, uncomplicated: Secondary | ICD-10-CM | POA: Diagnosis present

## 2017-09-28 DIAGNOSIS — F418 Other specified anxiety disorders: Secondary | ICD-10-CM | POA: Diagnosis present

## 2017-09-28 DIAGNOSIS — Z905 Acquired absence of kidney: Secondary | ICD-10-CM

## 2017-09-28 DIAGNOSIS — M810 Age-related osteoporosis without current pathological fracture: Secondary | ICD-10-CM | POA: Diagnosis present

## 2017-09-28 DIAGNOSIS — Z91040 Latex allergy status: Secondary | ICD-10-CM

## 2017-09-28 DIAGNOSIS — Z9842 Cataract extraction status, left eye: Secondary | ICD-10-CM

## 2017-09-28 DIAGNOSIS — M4802 Spinal stenosis, cervical region: Secondary | ICD-10-CM | POA: Diagnosis not present

## 2017-09-28 DIAGNOSIS — Z888 Allergy status to other drugs, medicaments and biological substances status: Secondary | ICD-10-CM

## 2017-09-28 DIAGNOSIS — M4322 Fusion of spine, cervical region: Secondary | ICD-10-CM | POA: Diagnosis present

## 2017-09-28 DIAGNOSIS — Z85528 Personal history of other malignant neoplasm of kidney: Secondary | ICD-10-CM

## 2017-09-28 DIAGNOSIS — G2581 Restless legs syndrome: Secondary | ICD-10-CM | POA: Diagnosis not present

## 2017-09-28 DIAGNOSIS — M47812 Spondylosis without myelopathy or radiculopathy, cervical region: Principal | ICD-10-CM | POA: Diagnosis present

## 2017-09-28 DIAGNOSIS — Z885 Allergy status to narcotic agent status: Secondary | ICD-10-CM

## 2017-09-28 DIAGNOSIS — Z90710 Acquired absence of both cervix and uterus: Secondary | ICD-10-CM

## 2017-09-28 DIAGNOSIS — Z91013 Allergy to seafood: Secondary | ICD-10-CM

## 2017-09-28 DIAGNOSIS — I341 Nonrheumatic mitral (valve) prolapse: Secondary | ICD-10-CM | POA: Diagnosis not present

## 2017-09-28 DIAGNOSIS — Z9841 Cataract extraction status, right eye: Secondary | ICD-10-CM

## 2017-09-28 DIAGNOSIS — Z800 Family history of malignant neoplasm of digestive organs: Secondary | ICD-10-CM

## 2017-09-28 DIAGNOSIS — Z818 Family history of other mental and behavioral disorders: Secondary | ICD-10-CM

## 2017-09-28 DIAGNOSIS — F319 Bipolar disorder, unspecified: Secondary | ICD-10-CM | POA: Diagnosis present

## 2017-09-28 DIAGNOSIS — Z419 Encounter for procedure for purposes other than remedying health state, unspecified: Secondary | ICD-10-CM

## 2017-09-28 DIAGNOSIS — Z981 Arthrodesis status: Secondary | ICD-10-CM

## 2017-09-28 DIAGNOSIS — Z9049 Acquired absence of other specified parts of digestive tract: Secondary | ICD-10-CM

## 2017-09-28 DIAGNOSIS — Z961 Presence of intraocular lens: Secondary | ICD-10-CM | POA: Diagnosis present

## 2017-09-28 HISTORY — PX: ANTERIOR CERVICAL DECOMP/DISCECTOMY FUSION: SHX1161

## 2017-09-28 SURGERY — ANTERIOR CERVICAL DECOMPRESSION/DISCECTOMY FUSION 3 LEVELS
Anesthesia: General

## 2017-09-28 MED ORDER — BUPROPION HCL 75 MG PO TABS
75.0000 mg | ORAL_TABLET | Freq: Every day | ORAL | Status: DC
  Administered 2017-09-28: 75 mg via ORAL
  Filled 2017-09-28 (×2): qty 1

## 2017-09-28 MED ORDER — SODIUM CHLORIDE 0.9 % IR SOLN
Status: DC | PRN
  Administered 2017-09-28: 09:00:00

## 2017-09-28 MED ORDER — ONDANSETRON HCL 4 MG/2ML IJ SOLN: 4.0000 mg | Freq: Four times a day (QID) | INTRAMUSCULAR | Status: DC | PRN

## 2017-09-28 MED ORDER — PHENOL 1.4 % MT LIQD
1.0000 | OROMUCOSAL | Status: DC | PRN
  Filled 2017-09-28: qty 177

## 2017-09-28 MED ORDER — MIDAZOLAM HCL 5 MG/5ML IJ SOLN
INTRAMUSCULAR | Status: DC | PRN
  Administered 2017-09-28: 2 mg via INTRAVENOUS

## 2017-09-28 MED ORDER — METHOCARBAMOL 1000 MG/10ML IJ SOLN
500.0000 mg | Freq: Four times a day (QID) | INTRAMUSCULAR | Status: DC | PRN
  Filled 2017-09-28: qty 5

## 2017-09-28 MED ORDER — SENNA 8.6 MG PO TABS
1.0000 | ORAL_TABLET | Freq: Two times a day (BID) | ORAL | Status: DC
  Administered 2017-09-28 (×2): 8.6 mg via ORAL
  Filled 2017-09-28 (×2): qty 1

## 2017-09-28 MED ORDER — THROMBIN (RECOMBINANT) 5000 UNITS EX SOLR
CUTANEOUS | Status: AC
  Filled 2017-09-28: qty 5000

## 2017-09-28 MED ORDER — NITROGLYCERIN 0.4 MG SL SUBL: 0.4000 mg | SUBLINGUAL_TABLET | SUBLINGUAL | Status: DC | PRN

## 2017-09-28 MED ORDER — ACETAMINOPHEN 325 MG PO TABS: 650.0000 mg | ORAL_TABLET | ORAL | Status: DC | PRN

## 2017-09-28 MED ORDER — FENTANYL CITRATE (PF) 250 MCG/5ML IJ SOLN
INTRAMUSCULAR | Status: AC
  Filled 2017-09-28: qty 5

## 2017-09-28 MED ORDER — FENTANYL CITRATE (PF) 100 MCG/2ML IJ SOLN
INTRAMUSCULAR | Status: DC | PRN
  Administered 2017-09-28: 100 ug via INTRAVENOUS
  Administered 2017-09-28: 50 ug via INTRAVENOUS
  Administered 2017-09-28: 100 ug via INTRAVENOUS

## 2017-09-28 MED ORDER — POTASSIUM CHLORIDE IN NACL 20-0.9 MEQ/L-% IV SOLN: INTRAVENOUS | Status: DC

## 2017-09-28 MED ORDER — DEXAMETHASONE SODIUM PHOSPHATE 4 MG/ML IJ SOLN
INTRAMUSCULAR | Status: DC | PRN
  Administered 2017-09-28: 10 mg via INTRAVENOUS

## 2017-09-28 MED ORDER — ROCURONIUM BROMIDE 10 MG/ML (PF) SYRINGE
PREFILLED_SYRINGE | INTRAVENOUS | Status: AC
  Filled 2017-09-28: qty 5

## 2017-09-28 MED ORDER — SUGAMMADEX SODIUM 200 MG/2ML IV SOLN
INTRAVENOUS | Status: DC | PRN
  Administered 2017-09-28: 200 mg via INTRAVENOUS

## 2017-09-28 MED ORDER — PHENYLEPHRINE HCL 10 MG/ML IJ SOLN
INTRAMUSCULAR | Status: DC | PRN
  Administered 2017-09-28 (×3): 80 ug via INTRAVENOUS

## 2017-09-28 MED ORDER — CHLORHEXIDINE GLUCONATE CLOTH 2 % EX PADS: 6.0000 | MEDICATED_PAD | Freq: Once | CUTANEOUS | Status: DC

## 2017-09-28 MED ORDER — MIDAZOLAM HCL 2 MG/2ML IJ SOLN
INTRAMUSCULAR | Status: AC
  Filled 2017-09-28: qty 2

## 2017-09-28 MED ORDER — ONDANSETRON HCL 4 MG/2ML IJ SOLN
INTRAMUSCULAR | Status: AC
  Filled 2017-09-28: qty 2

## 2017-09-28 MED ORDER — FLUOXETINE HCL 20 MG PO CAPS
80.0000 mg | ORAL_CAPSULE | Freq: Every day | ORAL | Status: DC
  Administered 2017-09-28: 80 mg via ORAL
  Filled 2017-09-28: qty 4

## 2017-09-28 MED ORDER — 0.9 % SODIUM CHLORIDE (POUR BTL) OPTIME
TOPICAL | Status: DC | PRN
Start: 2017-09-28 — End: 2017-09-28
  Administered 2017-09-28: 1000 mL

## 2017-09-28 MED ORDER — DEXAMETHASONE SODIUM PHOSPHATE 10 MG/ML IJ SOLN
INTRAMUSCULAR | Status: AC
  Filled 2017-09-28: qty 1

## 2017-09-28 MED ORDER — OLANZAPINE 10 MG PO TABS
20.0000 mg | ORAL_TABLET | Freq: Every day | ORAL | Status: DC
  Administered 2017-09-28: 20 mg via ORAL
  Filled 2017-09-28: qty 2

## 2017-09-28 MED ORDER — LIDOCAINE HCL (CARDIAC) 20 MG/ML IV SOLN
INTRAVENOUS | Status: DC | PRN
  Administered 2017-09-28: 60 mg via INTRAVENOUS

## 2017-09-28 MED ORDER — FLUOXETINE HCL 40 MG PO CAPS: 40.0000 mg | ORAL_CAPSULE | Freq: Two times a day (BID) | ORAL | Status: DC

## 2017-09-28 MED ORDER — PROPOFOL 10 MG/ML IV BOLUS
INTRAVENOUS | Status: AC
  Filled 2017-09-28: qty 20

## 2017-09-28 MED ORDER — PROPOFOL 10 MG/ML IV BOLUS
INTRAVENOUS | Status: DC | PRN
  Administered 2017-09-28: 120 mg via INTRAVENOUS

## 2017-09-28 MED ORDER — SODIUM CHLORIDE 0.9% FLUSH
3.0000 mL | Freq: Two times a day (BID) | INTRAVENOUS | Status: DC
  Administered 2017-09-28: 3 mL via INTRAVENOUS

## 2017-09-28 MED ORDER — ROCURONIUM BROMIDE 100 MG/10ML IV SOLN
INTRAVENOUS | Status: DC | PRN
  Administered 2017-09-28: 30 mg via INTRAVENOUS
  Administered 2017-09-28: 50 mg via INTRAVENOUS

## 2017-09-28 MED ORDER — BUPIVACAINE HCL (PF) 0.25 % IJ SOLN
INTRAMUSCULAR | Status: AC
  Filled 2017-09-28: qty 30

## 2017-09-28 MED ORDER — PHENYLEPHRINE 40 MCG/ML (10ML) SYRINGE FOR IV PUSH (FOR BLOOD PRESSURE SUPPORT)
PREFILLED_SYRINGE | INTRAVENOUS | Status: AC
  Filled 2017-09-28: qty 10

## 2017-09-28 MED ORDER — CEFAZOLIN SODIUM-DEXTROSE 2-4 GM/100ML-% IV SOLN
2.0000 g | INTRAVENOUS | Status: AC
  Administered 2017-09-28: 2 g via INTRAVENOUS
  Filled 2017-09-28: qty 100

## 2017-09-28 MED ORDER — METHOCARBAMOL 500 MG PO TABS
500.0000 mg | ORAL_TABLET | Freq: Four times a day (QID) | ORAL | Status: DC | PRN
  Administered 2017-09-28 – 2017-09-29 (×2): 500 mg via ORAL
  Filled 2017-09-28 (×2): qty 1

## 2017-09-28 MED ORDER — ROPINIROLE HCL 1 MG PO TABS: 1.0000 mg | ORAL_TABLET | Freq: Three times a day (TID) | ORAL | Status: DC

## 2017-09-28 MED ORDER — MENTHOL 3 MG MT LOZG: 1.0000 | LOZENGE | OROMUCOSAL | Status: DC | PRN

## 2017-09-28 MED ORDER — MORPHINE SULFATE (PF) 4 MG/ML IV SOLN: 2.0000 mg | INTRAVENOUS | Status: DC | PRN

## 2017-09-28 MED ORDER — LIDOCAINE 2% (20 MG/ML) 5 ML SYRINGE
INTRAMUSCULAR | Status: AC
  Filled 2017-09-28: qty 5

## 2017-09-28 MED ORDER — ALPRAZOLAM 0.5 MG PO TABS
1.0000 mg | ORAL_TABLET | Freq: Four times a day (QID) | ORAL | Status: DC
  Administered 2017-09-28 (×3): 1 mg via ORAL
  Filled 2017-09-28 (×3): qty 2

## 2017-09-28 MED ORDER — ONDANSETRON HCL 4 MG PO TABS: 4.0000 mg | ORAL_TABLET | Freq: Four times a day (QID) | ORAL | Status: DC | PRN

## 2017-09-28 MED ORDER — MORPHINE SULFATE (PF) 4 MG/ML IV SOLN: 1.0000 mg | INTRAVENOUS | Status: DC | PRN

## 2017-09-28 MED ORDER — OXYBUTYNIN CHLORIDE ER 15 MG PO TB24
15.0000 mg | ORAL_TABLET | Freq: Every day | ORAL | Status: DC
  Administered 2017-09-28: 15 mg via ORAL
  Filled 2017-09-28 (×2): qty 1

## 2017-09-28 MED ORDER — CEFAZOLIN SODIUM-DEXTROSE 2-4 GM/100ML-% IV SOLN
2.0000 g | Freq: Three times a day (TID) | INTRAVENOUS | Status: AC
  Administered 2017-09-28 – 2017-09-29 (×2): 2 g via INTRAVENOUS
  Filled 2017-09-28 (×2): qty 100

## 2017-09-28 MED ORDER — PHENYLEPHRINE HCL 10 MG/ML IJ SOLN
INTRAVENOUS | Status: DC | PRN
  Administered 2017-09-28: 20 ug/min via INTRAVENOUS

## 2017-09-28 MED ORDER — THROMBIN (RECOMBINANT) 5000 UNITS EX SOLR
OROMUCOSAL | Status: DC | PRN
  Administered 2017-09-28: 09:00:00 via TOPICAL

## 2017-09-28 MED ORDER — OXYCODONE HCL 5 MG PO TABS
5.0000 mg | ORAL_TABLET | ORAL | Status: DC | PRN
  Administered 2017-09-28 – 2017-09-29 (×5): 5 mg via ORAL
  Filled 2017-09-28 (×5): qty 1

## 2017-09-28 MED ORDER — SODIUM CHLORIDE 0.9% FLUSH: 3.0000 mL | INTRAVENOUS | Status: DC | PRN

## 2017-09-28 MED ORDER — ACETAMINOPHEN 650 MG RE SUPP: 650.0000 mg | RECTAL | Status: DC | PRN

## 2017-09-28 MED ORDER — GLYCOPYRROLATE 0.2 MG/ML IJ SOLN
INTRAMUSCULAR | Status: DC | PRN
  Administered 2017-09-28: 0.2 mg via INTRAVENOUS

## 2017-09-28 MED ORDER — ONDANSETRON HCL 4 MG/2ML IJ SOLN
INTRAMUSCULAR | Status: DC | PRN
  Administered 2017-09-28: 4 mg via INTRAVENOUS

## 2017-09-28 MED ORDER — ROPINIROLE HCL 1 MG PO TABS
3.0000 mg | ORAL_TABLET | Freq: Three times a day (TID) | ORAL | Status: DC
  Administered 2017-09-28 (×2): 3 mg via ORAL
  Filled 2017-09-28 (×2): qty 3

## 2017-09-28 MED ORDER — SODIUM CHLORIDE 0.9 % IV SOLN: 250.0000 mL | INTRAVENOUS | Status: DC

## 2017-09-28 MED ORDER — LACTATED RINGERS IV SOLN
INTRAVENOUS | Status: DC | PRN
  Administered 2017-09-28 (×2): via INTRAVENOUS

## 2017-09-28 MED ORDER — BUPIVACAINE HCL (PF) 0.25 % IJ SOLN
INTRAMUSCULAR | Status: DC | PRN
  Administered 2017-09-28: 5 mL

## 2017-09-28 SURGICAL SUPPLY — 59 items
APL SKNCLS STERI-STRIP NONHPOA (GAUZE/BANDAGES/DRESSINGS) ×1
BAG DECANTER FOR FLEXI CONT (MISCELLANEOUS) ×2 IMPLANT
BASKET BONE COLLECTION (BASKET) ×1 IMPLANT
BENZOIN TINCTURE PRP APPL 2/3 (GAUZE/BANDAGES/DRESSINGS) ×2 IMPLANT
BIT DRILL 13 (BIT) ×1 IMPLANT
BUR MATCHSTICK NEURO 3.0 LAGG (BURR) ×2 IMPLANT
CAGE PEEK 6X14X11 (Cage) ×3 IMPLANT
CANISTER SUCT 3000ML PPV (MISCELLANEOUS) ×2 IMPLANT
CARTRIDGE OIL MAESTRO DRILL (MISCELLANEOUS) ×1 IMPLANT
DIFFUSER DRILL AIR PNEUMATIC (MISCELLANEOUS) ×2 IMPLANT
DRAPE C-ARM 42X72 X-RAY (DRAPES) ×4 IMPLANT
DRAPE LAPAROTOMY 100X72 PEDS (DRAPES) ×2 IMPLANT
DRAPE MICROSCOPE LEICA (MISCELLANEOUS) ×2 IMPLANT
DRAPE POUCH INSTRU U-SHP 10X18 (DRAPES) ×2 IMPLANT
DRSG OPSITE POSTOP 3X4 (GAUZE/BANDAGES/DRESSINGS) ×1 IMPLANT
DRSG OPSITE POSTOP 4X6 (GAUZE/BANDAGES/DRESSINGS) ×1 IMPLANT
DURAPREP 6ML APPLICATOR 50/CS (WOUND CARE) ×2 IMPLANT
ELECT COATED BLADE 2.86 ST (ELECTRODE) ×2 IMPLANT
ELECT REM PT RETURN 9FT ADLT (ELECTROSURGICAL) ×2
ELECTRODE REM PT RTRN 9FT ADLT (ELECTROSURGICAL) ×1 IMPLANT
GAUZE SPONGE 4X4 16PLY XRAY LF (GAUZE/BANDAGES/DRESSINGS) IMPLANT
GLOVE BIO SURGEON STRL SZ7 (GLOVE) IMPLANT
GLOVE BIO SURGEON STRL SZ8 (GLOVE) ×1 IMPLANT
GLOVE BIOGEL PI IND STRL 7.0 (GLOVE) IMPLANT
GLOVE BIOGEL PI INDICATOR 7.0 (GLOVE) ×1
GLOVE SURG SS PI 7.0 STRL IVOR (GLOVE) ×1 IMPLANT
GLOVE SURG SS PI 7.5 STRL IVOR (GLOVE) ×1 IMPLANT
GLOVE SURG SS PI 8.0 STRL IVOR (GLOVE) ×1 IMPLANT
GOWN STRL REUS W/ TWL LRG LVL3 (GOWN DISPOSABLE) IMPLANT
GOWN STRL REUS W/ TWL XL LVL3 (GOWN DISPOSABLE) ×1 IMPLANT
GOWN STRL REUS W/TWL 2XL LVL3 (GOWN DISPOSABLE) IMPLANT
GOWN STRL REUS W/TWL LRG LVL3 (GOWN DISPOSABLE)
GOWN STRL REUS W/TWL XL LVL3 (GOWN DISPOSABLE) ×2
HEMOSTAT POWDER KIT SURGIFOAM (HEMOSTASIS) ×2 IMPLANT
KIT BASIN OR (CUSTOM PROCEDURE TRAY) ×2 IMPLANT
KIT ROOM TURNOVER OR (KITS) ×2 IMPLANT
NDL HYPO 25X1 1.5 SAFETY (NEEDLE) ×1 IMPLANT
NDL SPNL 20GX3.5 QUINCKE YW (NEEDLE) ×1 IMPLANT
NEEDLE HYPO 25X1 1.5 SAFETY (NEEDLE) ×2 IMPLANT
NEEDLE SPNL 20GX3.5 QUINCKE YW (NEEDLE) ×4 IMPLANT
NS IRRIG 1000ML POUR BTL (IV SOLUTION) ×2 IMPLANT
OIL CARTRIDGE MAESTRO DRILL (MISCELLANEOUS) ×2
PACK LAMINECTOMY NEURO (CUSTOM PROCEDURE TRAY) ×2 IMPLANT
PAD ARMBOARD 7.5X6 YLW CONV (MISCELLANEOUS) ×2 IMPLANT
PIN DISTRACTION 14MM (PIN) ×4 IMPLANT
PLATE 3 55XLCK NS SPNE CVD (Plate) IMPLANT
PLATE 3 ATLANTIS TRANS (Plate) ×2 IMPLANT
RUBBERBAND STERILE (MISCELLANEOUS) ×4 IMPLANT
SCREW ST 12X4XST FXANG NS (Screw) IMPLANT
SCREW ST 12X4XST VA NS SPNE (Screw) IMPLANT
SCREW ST FIX 4 ATL (Screw) ×12 IMPLANT
SCREW ST VAR 4 ATL (Screw) ×4 IMPLANT
SPONGE INTESTINAL PEANUT (DISPOSABLE) ×2 IMPLANT
SPONGE SURGIFOAM ABS GEL 100 (HEMOSTASIS) ×2 IMPLANT
STRIP CLOSURE SKIN 1/2X4 (GAUZE/BANDAGES/DRESSINGS) ×2 IMPLANT
SUT VIC AB 3-0 SH 8-18 (SUTURE) ×2 IMPLANT
TOWEL GREEN STERILE (TOWEL DISPOSABLE) ×2 IMPLANT
TOWEL GREEN STERILE FF (TOWEL DISPOSABLE) ×2 IMPLANT
WATER STERILE IRR 1000ML POUR (IV SOLUTION) ×2 IMPLANT

## 2017-09-28 NOTE — H&P (Signed)
Subjective:   Patient is a 72 y.o. female admitted for neck and arm pain. The patient first presented to me with complaints of neck pain and shooting pains in the arm(s). Onset of symptoms was several months ago. The pain is described as aching and occurs all day. The pain is rated severe, and is located in the neck and radiates to the arms. The symptoms have been progressive. Symptoms are exacerbated by extending head backwards, and are relieved by none.  Previous work up includes MRI of cervical spine, results: spinal stenosis.  Past Medical History:  Diagnosis Date  . Anemia    hx  . Anxiety   . Arthritis    chronic back pain  . Bipolar disorder ([place])   . Cancer [facility]) Jan 2013   kidney cancer, left, s/p partial nephrectomy  . Depression   . Diarrhea    incontinent stools  . Diverticulitis    treated at least 5 times in past, hospitalized twice  . Heart murmur   . History of hiatal hernia    s/p  . MVP (mitral valve prolapse)   . Neuropathy   . Nocturia   . Osteoarthritis   . Osteoporosis   . PONV (postoperative nausea and vomiting)   . Psychotic affective disorder ([place])    "psychotic delusions" "I cut myself"   . Restless leg syndrome   . Rheumatic fever   . Urinary frequency     Past Surgical History:  Procedure Laterality Date  . ABDOMINAL HYSTERECTOMY    . BACK SURGERY    . BACK SURGERY     2 plates, 8 screws in back  . carpell tunnell     rt wrist  x2  . CATARACT EXTRACTION W/PHACO Left 06/14/2016   Procedure: CATARACT EXTRACTION PHACO AND INTRAOCULAR LENS PLACEMENT; CDE:  3.92;  Surgeon: Williams Che, MD;  Location: [facility];  Service: Ophthalmology;  Laterality: Left;  . CHOLECYSTECTOMY    . COLONOSCOPY  5/02   pancolonic deverticula, internal hemorrhoids  . COLONOSCOPY  1/11   single external hemorrhoidal tag and anal papilla otherwise normal rectum, pancolonic diverticula, s/p sigmoid biopsy and stool sampling all unremarkable  .  ESOPHAGOGASTRODUODENOSCOPY  04/06/2010   intact Nissen fundoplication S/P dilation, somewhat baggy atonic appearing esophagus, 58-F dilation  . ESOPHAGOGASTRODUODENOSCOPY  1/11   nomral esophagus s/p 54-F Maloney dilation, normal/intact Nissen fundoplication, diffuse patchy erythema and erosions likely NSAID/ASA effect with benign biopsy  . EYE SURGERY     right eye cataract  . kidney surgery for cancer    . MAXIMUM ACCESS (MAS)POSTERIOR LUMBAR INTERBODY FUSION (PLIF) 1 LEVEL N/A 07/25/2014   Procedure: FOR MAXIMUM ACCESS (MAS) POSTERIOR LUMBAR INTERBODY FUSION (PLIF) Lumbar two/three, Removal of hardware Lumbar three/four;  Surgeon: Eustace Moore, MD;  Location: [place] [facility];  Service: Neurosurgery;  Laterality: N/A;  . MAXIMUM ACCESS (MAS)POSTERIOR LUMBAR INTERBODY FUSION (PLIF) 2 LEVEL N/A 09/11/2015   Procedure: LUMBAR FOUR-FIVE LUMBAR FIVE SACRAL ONE  MAXIMUM ACCESS SURGERY, POSTERIOR LUMBAR INTERBODY FUSION , Removal of LUMBAR TWO TO LUMBAR FOUR  HARDWARE;  Surgeon: Eustace Moore, MD;  Location: [place] [facility];  Service: Neurosurgery;  Laterality: N/A;  . NISSEN FUNDOPLICATION    . REVERSE SHOULDER ARTHROPLASTY Right 05/28/2016   Procedure: RIGHT REVERSE SHOULDER ARTHROPLASTY;  Surgeon: Netta Cedars, MD;  Location: [place];  Service: Orthopedics;  Laterality: Right;  . ROBOT ASSISTED LAPAROSCOPIC NEPHRECTOMY  08/23/2011   Procedure: ROBOTIC ASSISTED LAPAROSCOPIC NEPHRECTOMY;  Surgeon: Dutch Gray, MD;  Location: [facility];  Service: Urology;  Laterality: Left;  Left Robotic Assisted  Laparoscopic Partial Nephrectomy   . ROTATOR CUFF REPAIR     right side X 2  . TONSILLECTOMY    . TOTAL SHOULDER REVISION Right 08/19/2017   Procedure: RIGHT SHOULDER POLYETHYLENE EXCHANGE;  Surgeon: Netta Cedars, MD;  Location: [place];  Service: Orthopedics;  Laterality: Right;    Allergies  Allergen Reactions  . Shellfish Allergy Anaphylaxis  . Neurontin [Gabapentin] Other (See Comments)    Unable to talk when takes  medication  . Chlorhexidine Gluconate Rash and Other (See Comments)    burning  . Codeine Itching     itching  . Haldol [Haloperidol Lactate] Other (See Comments)    Muscle spasms  . Hydromorphone Hcl Nausea And Vomiting  . Latex Rash    adhesives  . Propoxyphene N-Acetaminophen Nausea And Vomiting  . Vicodin [Hydrocodone-Acetaminophen] Itching    Social History   Tobacco Use  . Smoking status: Never Smoker  . Smokeless tobacco: Never Used  Substance Use Topics  . Alcohol use: No    Family History  Problem Relation Age of Onset  . Bipolar disorder Daughter   . Colon cancer Neg Hx    Prior to Admission medications   Medication Sig Start Date End Date Taking? Authorizing Provider  ALPRAZolam Duanne Moron) 1 MG tablet Take 1 tablet (1 mg total) by mouth 4 (four) times daily. 05/25/17 05/25/18 Yes Cloria Spring, MD  atorvastatin (LIPITOR) 20 MG tablet TAKE 1 TABLET (20mg ) BY MOUTH EVERYDAY AT BEDTIME 07/28/17  Yes [provider]  buPROPion (WELLBUTRIN) 75 MG tablet TAKE 1 TABLET (75mg ) BY MOUTH EVERY DAY IN THE MORNING 07/17/17  Yes [provider]  cholestyramine (QUESTRAN) 4 GM/DOSE powder TAKE 1 SCOOPFUL (4 G TOTAL) BY MOUTH DAILY. DO NOT TAKE WITHIN 2 HOURS OF OTHER MEDICATIONS 05/06/17  Yes Mahala Menghini, PA-C  FLUoxetine (PROZAC) 40 MG capsule Take 1 capsule (40 mg total) by mouth 2 (two) times daily. 05/25/17 05/25/18 Yes Cloria Spring, MD  meloxicam (MOBIC) 7.5 MG tablet Take 7.5 mg by mouth 2 (two) times daily.   Yes [provider]  Multiple Vitamin (MULTIVITAMIN WITH MINERALS) TABS Take 2 tablets by mouth every evening.    Yes [provider]  Multiple Vitamins-Minerals (HAIR/SKIN/NAILS) CAPS Take 2 tablets by mouth daily.   Yes [provider]  naproxen sodium (ALEVE) 220 MG tablet Take 440 mg by mouth 2 (two) times daily as needed (pain).   Yes [provider]  OLANZapine (ZYPREXA) 20 MG tablet Take 1 tablet (20 mg total) by  mouth at bedtime. 05/25/17 05/25/18 Yes Cloria Spring, MD  oxybutynin (DITROPAN XL) 15 MG 24 hr tablet Take 15 mg by mouth daily. 01/28/17  Yes [provider]  rOPINIRole (REQUIP) 3 MG tablet TAKE 1 TABLET (3mg ) BY MOUTH 3 TIMES DAILY. 07/21/15  Yes [provider]  traZODone (DESYREL) 100 MG tablet TAKE 1 OR 2 TABLETS AT BEDTIME Patient taking differently: Take 100-200 mg by mouth at bedtime as needed for sleep.  08/31/17  Yes Cloria Spring, MD  Vitamin D, Ergocalciferol, (DRISDOL) 50000 units CAPS capsule Take 50,000 Units by mouth every 7 (seven) days. Fridays   Yes [provider]  nitroGLYCERIN (NITROSTAT) 0.4 MG SL tablet Place 0.4 mg under the tongue every 5 (five) minutes as needed for chest pain.    [provider]     Review of Systems  Positive ROS:  neg  All other systems have been reviewed and were otherwise negative with the exception of those mentioned in the HPI and as above.  Objective: Vital signs in last 24 hours: Temp:  [98.9 F (37.2 C)-99.2 F (37.3 C)] 99.2 F (37.3 C) (01/09 0646) Pulse Rate:  [74-92] 74 (01/09 0646) Resp:  [20] 20 (01/09 0646) BP: (146-153)/(79-81) 153/79 (01/09 0646) SpO2:  [97 %-99 %] 97 % (01/09 0646) Weight:  [83.7 kg (184 lb 9.6 oz)] 83.7 kg (184 lb 9.6 oz) (01/09 0646)  General Appearance: Alert, cooperative, no distress, appears stated age Head: Normocephalic, without obvious abnormality, atraumatic Eyes: PERRL, conjunctiva/corneas clear, EOM's intact      Neck: Supple, symmetrical, trachea midline, Back: Symmetric, no curvature, ROM normal, no CVA tenderness Lungs:  respirations unlabored Heart: Regular rate and rhythm Abdomen: Soft, non-tender Extremities: Extremities normal, atraumatic, no cyanosis or edema Pulses: 2+ and symmetric all extremities Skin: Skin color, texture, turgor normal, no rashes or lesions  NEUROLOGIC:  Mental status: Alert and oriented x4, no aphasia, good attention  span, fund of knowledge and memory  Motor Exam - grossly normal Sensory Exam - grossly normal Reflexes: trace Coordination - grossly normal Gait - grossly normal Balance - grossly normal Cranial Nerves: I: smell Not tested  II: visual acuity  OS: nl    OD: nl  II: visual fields Full to confrontation  II: pupils Equal, round, reactive to light  III,VII: ptosis None  III,IV,VI: extraocular muscles  Full ROM  V: mastication Normal  V: facial light touch sensation  Normal  V,VII: corneal reflex  Present  VII: facial muscle function - upper  Normal  VII: facial muscle function - lower Normal  VIII: hearing Not tested  IX: soft palate elevation  Normal  IX,X: gag reflex Present  XI: trapezius strength  5/5  XI: sternocleidomastoid strength 5/5  XI: neck flexion strength  5/5  XII: tongue strength  Normal    Data Review Lab Results  Component Value Date   WBC 5.3 09/27/2017   HGB 12.4 09/27/2017   HCT 38.3 09/27/2017   MCV 93.4 09/27/2017   PLT 201 09/27/2017   Lab Results  Component Value Date   NA 140 08/17/2017   K 4.0 08/17/2017   CL 107 08/17/2017   CO2 27 08/17/2017   BUN 9 08/17/2017   CREATININE 0.69 08/17/2017   GLUCOSE 92 08/17/2017   Lab Results  Component Value Date   INR 0.94 09/03/2015    Assessment:   Cervical neck pain with herniated nucleus pulposus/ spondylosis/ stenosis at C4-7. Patient has failed conservative therapy. Planned surgery : 3 level ACDF  Plan:   I explained the condition and procedure to the patient and answered any questions.  Patient wishes to proceed with procedure as planned. Understands risks/ benefits/ and expected or typical outcomes.  Victoria Mccarthy S 09/28/2017 7:01 AM

## 2017-09-28 NOTE — Op Note (Signed)
09/28/2017  10:55 AM  PATIENT:  Victoria Mccarthy  72 y.o. female  PRE-OPERATIVE DIAGNOSIS: Cervical spondylosis, neck and shoulder pain  POST-OPERATIVE DIAGNOSIS:  same  PROCEDURE:  1. Decompressive anterior cervical discectomy C4-5 C5-6 C6-7, 2. Anterior cervical arthrodesis C4-5 C5-6 C6-7 utilizing a peek interbody cage packed with locally harvested morcellized autologous bone graft, 3. Anterior cervical plating C4-C7 inclusive utilizing a a translational plate  SURGEON:  Sherley Bounds, MD  ASSISTANTS: Dr Sherwood Gambler  ANESTHESIA:   General  EBL: 50 ml  Total I/O In: 1000 [I.V.:1000] Out: 50 [Blood:50]  BLOOD ADMINISTERED: none  DRAINS: none  SPECIMEN:  none  INDICATION FOR PROCEDURE: This patient presented with severe neck and shoulder pain. Imaging showed cervical spondylosis. The patient tried conservative measures without relief. Pain was debilitating. Recommended ACDF with plating. Patient understood the risks, benefits, and alternatives and potential outcomes and wished to proceed.  PROCEDURE DETAILS: Patient was brought to the operating room placed under general endotracheal anesthesia. Patient was placed in the supine position on the operating room table. The neck was prepped with Duraprep and draped in a sterile fashion.   Three cc of local anesthesia was injected and a transverse incision was made on the right side of the neck.  Dissection was carried down thru the subcutaneous tissue and the platysma was  elevated, opened, and undermined with Metzenbaum scissors.  Dissection was then carried out thru an avascular plane leaving the sternocleidomastoid carotid artery and jugular vein laterally and the trachea and esophagus medially. The ventral aspect of the vertebral column was identified and a localizing x-ray was taken. The C6-7 level was identified. The longus colli muscles were then elevated and the retractor was placed to expose C4-5 and C5-6 and C6-7. The annulus was incised  at each level and the disc space entered. Discectomy was performed with micro-curettes and pituitary rongeurs. I then used the high-speed drill to drill the endplates down to the level of the posterior longitudinal ligament. The drill shavings were saved in a mucous trap for later arthrodesis. The operating microscope was draped and brought into the field provided additional magnification, illumination and visualization. Discectomy was continued posteriorly thru the disc space at each level. Posterior longitudinal ligament was opened with a nerve hook, and then removed along with disc herniation and osteophytes, decompressing the spinal canal and thecal sac. We then continued to remove osteophytic overgrowth and disc material decompressing the neural foramina and exiting nerve roots bilaterally. The scope was angled up and down to help decompress and undercut the vertebral bodies. Once the decompression was completed we could pass a nerve hook circumferentially to assure adequate decompression in the midline and in the neural foramina. So by both visualization and palpation we felt we had an adequate decompression of the neural elements. We then measured the height of the intravertebral disc spaces and selected a 6 millimeter Peek interbody cage packed with autograft. It was then gently positioned in the intravertebral disc space(s) and countersunk. I then used a 55 mm a translational plate and placed fixed angle screws into the vertebral bodies of each level and locked them into position. The wound was irrigated with bacitracin solution, checked for hemostasis which was established and confirmed. Once meticulous hemostasis was achieved, we then proceeded with closure. The platysma was closed with interrupted 3-0 undyed Vicryl suture, the subcuticular layer was closed with interrupted 3-0 undyed Vicryl suture. The skin edges were approximated with steristrips. The drapes were removed. A sterile dressing was applied.  The patient was then awakened from general anesthesia and transferred to the recovery room in stable condition. At the end of the procedure all sponge, needle and instrument counts were correct.   PLAN OF CARE: Admit to inpatient   PATIENT DISPOSITION:  PACU - hemodynamically stable.   Delay start of Pharmacological VTE agent (>24hrs) due to surgical blood loss or risk of bleeding:  yes

## 2017-09-28 NOTE — Anesthesia Procedure Notes (Signed)
Procedure Name: Intubation Date/Time: 09/28/2017 8:39 AM Performed by: Chidera Dearcos T, CRNA Pre-anesthesia Checklist: Patient identified, Emergency Drugs available, Suction available and Patient being monitored Patient Re-evaluated:Patient Re-evaluated prior to induction Oxygen Delivery Method: Circle system utilized Preoxygenation: Pre-oxygenation with 100% oxygen Induction Type: IV induction Ventilation: Mask ventilation without difficulty Laryngoscope Size: Miller and 2 Grade View: Grade I Tube type: Oral Tube size: 7.5 mm Number of attempts: 1 Airway Equipment and Method: Patient positioned with wedge pillow and Stylet Placement Confirmation: ETT inserted through vocal cords under direct vision,  positive ETCO2 and breath sounds checked- equal and bilateral Secured at: 21 cm Tube secured with: Tape Dental Injury: Teeth and Oropharynx as per pre-operative assessment

## 2017-09-28 NOTE — Transfer of Care (Signed)
Immediate Anesthesia Transfer of Care Note  Patient: Victoria Mccarthy  Procedure(s) Performed: ANTERIOR CERVICAL DECOMPRESSION/DISCECTOMY FUSION CERVICAL FOUR- CERVICAL FIVE, CERVICAL FIVE- CERVICAL SIX, CERVICAL SIX- CERVICAL SEVEN (N/A )  Patient Location: PACU  Anesthesia Type:General  Level of Consciousness: awake, alert  and oriented  Airway & Oxygen Therapy: Patient Spontanous Breathing and Patient connected to nasal cannula oxygen  Post-op Assessment: Report given to RN, Post -op Vital signs reviewed and stable and Patient moving all extremities X 4  Post vital signs: Reviewed and stable  Last Vitals:  Vitals:   09/28/17 0646 09/28/17 1100  BP: (!) 153/79   Pulse: 74   Resp: 20   Temp: 37.3 C 37.4 C  SpO2: 97%     Last Pain:  Vitals:   09/28/17 1100  TempSrc:   PainSc: 0-No pain      Patients Stated Pain Goal: 4 (03/00/92 3300)  Complications: No apparent anesthesia complications

## 2017-09-28 NOTE — Anesthesia Postprocedure Evaluation (Signed)
Anesthesia Post Note  Patient: Sachi Boulay Cavazos  Procedure(s) Performed: ANTERIOR CERVICAL DECOMPRESSION/DISCECTOMY FUSION CERVICAL FOUR- CERVICAL FIVE, CERVICAL FIVE- CERVICAL SIX, CERVICAL SIX- CERVICAL SEVEN (N/A )     Patient location during evaluation: PACU Anesthesia Type: General Level of consciousness: awake and alert Pain management: pain level controlled Vital Signs Assessment: post-procedure vital signs reviewed and stable Respiratory status: spontaneous breathing, nonlabored ventilation and respiratory function stable Cardiovascular status: blood pressure returned to baseline and stable Postop Assessment: no apparent nausea or vomiting Anesthetic complications: no    Last Vitals:  Vitals:   09/28/17 1133 09/28/17 1148  BP: 130/76 136/83  Pulse: 92   Resp: 13   Temp:  (!) 36.4 C  SpO2: 93%     Last Pain:  Vitals:   09/28/17 1148  TempSrc:   PainSc: 0-No pain                 Sherlynn Tourville,W. EDMOND

## 2017-09-29 ENCOUNTER — Ambulatory Visit (HOSPITAL_COMMUNITY): Payer: Self-pay | Admitting: Licensed Clinical Social Worker

## 2017-09-29 DIAGNOSIS — G2581 Restless legs syndrome: Secondary | ICD-10-CM | POA: Diagnosis present

## 2017-09-29 DIAGNOSIS — Z961 Presence of intraocular lens: Secondary | ICD-10-CM | POA: Diagnosis present

## 2017-09-29 DIAGNOSIS — Z91040 Latex allergy status: Secondary | ICD-10-CM | POA: Diagnosis not present

## 2017-09-29 DIAGNOSIS — M47812 Spondylosis without myelopathy or radiculopathy, cervical region: Secondary | ICD-10-CM | POA: Diagnosis present

## 2017-09-29 DIAGNOSIS — Z85528 Personal history of other malignant neoplasm of kidney: Secondary | ICD-10-CM | POA: Diagnosis not present

## 2017-09-29 DIAGNOSIS — F319 Bipolar disorder, unspecified: Secondary | ICD-10-CM | POA: Diagnosis present

## 2017-09-29 DIAGNOSIS — Z981 Arthrodesis status: Secondary | ICD-10-CM | POA: Diagnosis not present

## 2017-09-29 DIAGNOSIS — M50223 Other cervical disc displacement at C6-C7 level: Secondary | ICD-10-CM | POA: Diagnosis present

## 2017-09-29 DIAGNOSIS — Z91013 Allergy to seafood: Secondary | ICD-10-CM | POA: Diagnosis not present

## 2017-09-29 DIAGNOSIS — F17220 Nicotine dependence, chewing tobacco, uncomplicated: Secondary | ICD-10-CM | POA: Diagnosis present

## 2017-09-29 DIAGNOSIS — Z905 Acquired absence of kidney: Secondary | ICD-10-CM | POA: Diagnosis not present

## 2017-09-29 DIAGNOSIS — M4802 Spinal stenosis, cervical region: Secondary | ICD-10-CM | POA: Diagnosis present

## 2017-09-29 DIAGNOSIS — I341 Nonrheumatic mitral (valve) prolapse: Secondary | ICD-10-CM | POA: Diagnosis present

## 2017-09-29 DIAGNOSIS — M810 Age-related osteoporosis without current pathological fracture: Secondary | ICD-10-CM | POA: Diagnosis present

## 2017-09-29 DIAGNOSIS — Z888 Allergy status to other drugs, medicaments and biological substances status: Secondary | ICD-10-CM | POA: Diagnosis not present

## 2017-09-29 DIAGNOSIS — Z818 Family history of other mental and behavioral disorders: Secondary | ICD-10-CM | POA: Diagnosis not present

## 2017-09-29 DIAGNOSIS — G629 Polyneuropathy, unspecified: Secondary | ICD-10-CM | POA: Diagnosis present

## 2017-09-29 DIAGNOSIS — Z9049 Acquired absence of other specified parts of digestive tract: Secondary | ICD-10-CM | POA: Diagnosis not present

## 2017-09-29 DIAGNOSIS — Z90710 Acquired absence of both cervix and uterus: Secondary | ICD-10-CM | POA: Diagnosis not present

## 2017-09-29 DIAGNOSIS — F418 Other specified anxiety disorders: Secondary | ICD-10-CM | POA: Diagnosis present

## 2017-09-29 DIAGNOSIS — Z9841 Cataract extraction status, right eye: Secondary | ICD-10-CM | POA: Diagnosis not present

## 2017-09-29 DIAGNOSIS — Z9842 Cataract extraction status, left eye: Secondary | ICD-10-CM | POA: Diagnosis not present

## 2017-09-29 DIAGNOSIS — Z885 Allergy status to narcotic agent status: Secondary | ICD-10-CM | POA: Diagnosis not present

## 2017-09-29 DIAGNOSIS — Z800 Family history of malignant neoplasm of digestive organs: Secondary | ICD-10-CM | POA: Diagnosis not present

## 2017-09-29 MED ORDER — OXYCODONE-ACETAMINOPHEN 5-325 MG PO TABS: 1.0000 | ORAL_TABLET | Freq: Four times a day (QID) | ORAL | 0 refills | Status: AC | PRN

## 2017-09-29 MED ORDER — METHOCARBAMOL 500 MG PO TABS: 500.0000 mg | ORAL_TABLET | Freq: Four times a day (QID) | ORAL | 0 refills | Status: DC | PRN

## 2017-09-29 NOTE — Progress Notes (Signed)
Pt doing well. Pt and family given D/C instructions with Rx's, verbal understanding was provided. Pt's incision is clean and dry with no sign of infection. Pt's IV was removed prior to D/C. Pt D/C'd home via wheelchair @ 1330 per MD order. Pt is stable @ D/C and has no other needs at this time. Holli Humbles, RN

## 2017-09-29 NOTE — Discharge Summary (Signed)
Physician Discharge Summary  Patient ID: Victoria Mccarthy MRN: [id number] DOB/AGE: June 01, 1946 72 y.o.  Admit date: 09/28/2017 Discharge date: 09/29/2017  Admission Diagnoses: Cervical spondylosis, neck and shoulder pain   Discharge Diagnoses: same as admitting   Discharged Condition: good  Hospital Course: The patient was admitted on 09/28/2017 and taken to the operating room where the patient underwent ACDF C4-5,5-6,6-7. The patient tolerated the procedure well and was taken to the recovery room and then to the floor in stable condition. The hospital course was routine. There were no complications. The wound remained clean dry and intact. Pt had appropriate neck and throat soreness. No complaints of new pain or new N/T/W. The patient remained afebrile with stable vital signs, and tolerated a regular diet. The patient continued to increase activities, and pain was well controlled with oral pain medications.   Consults: None  Significant Diagnostic Studies:  Results for orders placed or performed during the hospital encounter of 09/27/17  Surgical pcr screen  Result Value Ref Range   MRSA, PCR NEGATIVE NEGATIVE   Staphylococcus aureus NEGATIVE NEGATIVE  CBC  Result Value Ref Range   WBC 5.3 4.0 - 10.5 K/uL   RBC 4.10 3.87 - 5.11 MIL/uL   Hemoglobin 12.4 12.0 - 15.0 g/dL   HCT 38.3 36.0 - 46.0 %   MCV 93.4 78.0 - 100.0 fL   MCH 30.2 26.0 - 34.0 pg   MCHC 32.4 30.0 - 36.0 g/dL   RDW 13.0 11.5 - 15.5 %   Platelets 201 150 - 400 K/uL  Type and screen  Result Value Ref Range   ABO/RH(D) O POS    Antibody Screen NEG    Sample Expiration 09/30/2017     Dg Cervical Spine 1 View  Result Date: 09/28/2017 CLINICAL DATA:  ACDF EXAM: DG C-ARM 61-120 MIN; DG CERVICAL SPINE - 1 VIEW COMPARISON:  None. FINDINGS: Two intraoperative spot images demonstrate changes of ACDF from C4-C7. Normal alignment. No visible hardware or bony complicating feature. IMPRESSION: C4-C7 ACDF.  No visible  complicating feature. Electronically Signed   By: Rolm Baptise M.D.   On: 09/28/2017 10:45   Dg C-arm 1-60 Min  Result Date: 09/28/2017 CLINICAL DATA:  ACDF EXAM: DG C-ARM 61-120 MIN; DG CERVICAL SPINE - 1 VIEW COMPARISON:  None. FINDINGS: Two intraoperative spot images demonstrate changes of ACDF from C4-C7. Normal alignment. No visible hardware or bony complicating feature. IMPRESSION: C4-C7 ACDF.  No visible complicating feature. Electronically Signed   By: Rolm Baptise M.D.   On: 09/28/2017 10:45    Antibiotics:  Anti-infectives (From admission, onward)   Start     Dose/Rate Route Frequency Ordered Stop   09/28/17 1700  ceFAZolin (ANCEF) IVPB 2g/100 mL premix     2 g 200 mL/hr over 30 Minutes Intravenous Every 8 hours 09/28/17 1222 09/29/17 0030   09/28/17 0926  bacitracin 50,000 Units in sodium chloride irrigation 0.9 % 500 mL irrigation  Status:  Discontinued       As needed 09/28/17 0927 09/28/17 1100   09/28/17 0630  ceFAZolin (ANCEF) IVPB 2g/100 mL premix     2 g 200 mL/hr over 30 Minutes Intravenous On call to O.R. 09/28/17 0615 09/28/17 0900      Discharge Exam: Blood pressure 125/66, pulse 70, temperature 97.7 F (36.5 C), temperature source Oral, resp. rate 18, height 5\' 5"  (1.651 m), weight 83.7 kg (184 lb 9.6 oz), SpO2 95 %. Neurologic: Grossly normal Ambulating and voiding well  Discharge Medications:  Allergies as of 09/29/2017      Reactions   Shellfish Allergy Anaphylaxis   Neurontin [gabapentin] Other (See Comments)   Unable to talk when takes medication   Chlorhexidine Gluconate Rash, Other (See Comments)   burning   Codeine Itching    itching   Haldol [haloperidol Lactate] Other (See Comments)   Muscle spasms   Hydromorphone Hcl Nausea And Vomiting   Latex Rash   adhesives   Propoxyphene N-acetaminophen Nausea And Vomiting   Vicodin [hydrocodone-acetaminophen] Itching      Medication List    TAKE these medications   ALPRAZolam 1 MG  tablet Commonly known as:  XANAX Take 1 tablet (1 mg total) by mouth 4 (four) times daily.   atorvastatin 20 MG tablet Commonly known as:  LIPITOR TAKE 1 TABLET (20mg ) BY MOUTH EVERYDAY AT BEDTIME   buPROPion 75 MG tablet Commonly known as:  WELLBUTRIN TAKE 1 TABLET (75mg ) BY MOUTH EVERY DAY IN THE MORNING   cholestyramine 4 GM/DOSE powder Commonly known as:  QUESTRAN TAKE 1 SCOOPFUL (4 G TOTAL) BY MOUTH DAILY. DO NOT TAKE WITHIN 2 HOURS OF OTHER MEDICATIONS   FLUoxetine 40 MG capsule Commonly known as:  PROZAC Take 1 capsule (40 mg total) by mouth 2 (two) times daily.   HAIR/SKIN/NAILS Caps Take 2 tablets by mouth daily.   meloxicam 7.5 MG tablet Commonly known as:  MOBIC Take 7.5 mg by mouth 2 (two) times daily.   methocarbamol 500 MG tablet Commonly known as:  ROBAXIN Take 1 tablet (500 mg total) by mouth every 6 (six) hours as needed for muscle spasms.   multivitamin with minerals Tabs tablet Take 2 tablets by mouth every evening.   naproxen sodium 220 MG tablet Commonly known as:  ALEVE Take 440 mg by mouth 2 (two) times daily as needed (pain).   nitroGLYCERIN 0.4 MG SL tablet Commonly known as:  NITROSTAT Place 0.4 mg under the tongue every 5 (five) minutes as needed for chest pain.   OLANZapine 20 MG tablet Commonly known as:  ZYPREXA Take 1 tablet (20 mg total) by mouth at bedtime.   oxybutynin 15 MG 24 hr tablet Commonly known as:  DITROPAN XL Take 15 mg by mouth daily.   oxyCODONE-acetaminophen 5-325 MG tablet Commonly known as:  ROXICET Take 1 tablet by mouth every 6 (six) hours as needed.   rOPINIRole 3 MG tablet Commonly known as:  REQUIP TAKE 1 TABLET (3mg ) BY MOUTH 3 TIMES DAILY.   traZODone 100 MG tablet Commonly known as:  DESYREL TAKE 1 OR 2 TABLETS AT BEDTIME What changed:    how much to take  how to take this  when to take this  reasons to take this  additional instructions   Vitamin D (Ergocalciferol) 50000 units Caps  capsule Commonly known as:  DRISDOL Take 50,000 Units by mouth every 7 (seven) days. Fridays       Disposition: home   Final Dx: same as admitting  Discharge Instructions     Remove dressing in 72 hours   Complete by:  As directed    Call MD for:   Complete by:  As directed    Call MD for:  difficulty breathing, headache or visual disturbances   Complete by:  As directed    Call MD for:  extreme fatigue   Complete by:  As directed    Call MD for:  hives   Complete by:  As directed    Call MD for:  persistant  dizziness or light-headedness   Complete by:  As directed    Call MD for:  persistant nausea and vomiting   Complete by:  As directed    Call MD for:  redness, tenderness, or signs of infection (pain, swelling, redness, odor or green/yellow discharge around incision site)   Complete by:  As directed    Call MD for:  severe uncontrolled pain   Complete by:  As directed    Call MD for:  temperature >100.4   Complete by:  As directed    Diet - low sodium heart healthy   Complete by:  As directed    Driving Restrictions   Complete by:  As directed    No driving for 2 weeks   Increase activity slowly   Complete by:  As directed    Lifting restrictions   Complete by:  As directed    Nothing heavier than 8 lbs         Signed: Ocie Cornfield Teosha Casso 09/29/2017, 6:31 AM

## 2017-09-30 ENCOUNTER — Encounter (HOSPITAL_COMMUNITY): Payer: Self-pay | Admitting: Neurological Surgery

## 2017-10-18 ENCOUNTER — Ambulatory Visit: Payer: Self-pay | Admitting: Urology

## 2017-11-21 DIAGNOSIS — I10 Essential (primary) hypertension: Secondary | ICD-10-CM | POA: Diagnosis not present

## 2017-11-21 DIAGNOSIS — Z6830 Body mass index (BMI) 30.0-30.9, adult: Secondary | ICD-10-CM | POA: Diagnosis not present

## 2017-11-21 DIAGNOSIS — M542 Cervicalgia: Secondary | ICD-10-CM | POA: Diagnosis not present

## 2017-11-25 ENCOUNTER — Other Ambulatory Visit: Payer: Self-pay | Admitting: Gastroenterology

## 2017-11-25 ENCOUNTER — Other Ambulatory Visit (HOSPITAL_COMMUNITY): Payer: Self-pay | Admitting: Psychiatry

## 2017-11-28 ENCOUNTER — Telehealth: Payer: Self-pay | Admitting: Gastroenterology

## 2017-11-28 ENCOUNTER — Encounter: Payer: Self-pay | Admitting: Internal Medicine

## 2017-11-28 NOTE — Telephone Encounter (Signed)
PATIENT SCHEDULED AND LETTER SENT  °

## 2017-11-28 NOTE — Telephone Encounter (Signed)
Patient needs to be seen by 01/2018 for two year follow up to continue refills. Limited refills provided today for questran.

## 2017-11-30 ENCOUNTER — Encounter (HOSPITAL_COMMUNITY): Payer: Self-pay | Admitting: Psychiatry

## 2017-11-30 ENCOUNTER — Ambulatory Visit (INDEPENDENT_AMBULATORY_CARE_PROVIDER_SITE_OTHER): Payer: Medicare Other | Admitting: Psychiatry

## 2017-11-30 VITALS — BP 144/88 | HR 84 | Ht 65.0 in | Wt 184.0 lb

## 2017-11-30 DIAGNOSIS — F3130 Bipolar disorder, current episode depressed, mild or moderate severity, unspecified: Secondary | ICD-10-CM | POA: Diagnosis not present

## 2017-11-30 DIAGNOSIS — G4700 Insomnia, unspecified: Secondary | ICD-10-CM

## 2017-11-30 DIAGNOSIS — Z818 Family history of other mental and behavioral disorders: Secondary | ICD-10-CM

## 2017-11-30 DIAGNOSIS — R450 Nervousness: Secondary | ICD-10-CM | POA: Diagnosis not present

## 2017-11-30 DIAGNOSIS — M542 Cervicalgia: Secondary | ICD-10-CM

## 2017-11-30 DIAGNOSIS — F419 Anxiety disorder, unspecified: Secondary | ICD-10-CM | POA: Diagnosis not present

## 2017-11-30 MED ORDER — OLANZAPINE 20 MG PO TABS: 20.0000 mg | ORAL_TABLET | Freq: Every day | ORAL | 2 refills | Status: DC

## 2017-11-30 MED ORDER — ALPRAZOLAM 1 MG PO TABS: 1.0000 mg | ORAL_TABLET | Freq: Four times a day (QID) | ORAL | 2 refills | Status: DC

## 2017-11-30 MED ORDER — TRAZODONE HCL 100 MG PO TABS: 200.0000 mg | ORAL_TABLET | Freq: Every day | ORAL | 2 refills | Status: DC

## 2017-11-30 MED ORDER — FLUOXETINE HCL 40 MG PO CAPS: 40.0000 mg | ORAL_CAPSULE | Freq: Two times a day (BID) | ORAL | 2 refills | Status: DC

## 2017-11-30 NOTE — Progress Notes (Signed)
[facility] MD/PA/NP OP Progress Note  11/30/2017 3:03 PM PERIAN TEDDER  MRN:  [id number]  Chief Complaint:  Chief Complaint    Depression; Manic Behavior; Anxiety; Follow-up     HPI: this patient is a 72 year old divorced white female who lives alone in [place]. She has one son, one daughter, 6 grandchildren 3 great-grandchildren. 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  The patient states that she's had problems with mental illness since her 73s. She was hospitalized at [facility] in [facility] twice in the 1980s. During these times she was depressed extremely anxious and having paranoid delusions that she had killed someone and that the police were after her. At one point she was hearing voices telling her to hurt her self. She finally started seeing Dr. Charmayne Sheer private practitioner in [place] who treated her for years. In the past she's been on Prozac, Symbyax, Xanax, Valium, BuSpar and lithium.  After her psychiatrist retired, she began going to the [facility] but got tired of waiting for hours for recheck visit. She most recently she's been going to [place] and families. The psychiatrist there has cut down her medication. She used to be on Symbiax 6/50-2 a day and she was cut down to 4-25, once a day. Since this happened she become extremely anxious and depressed. At the beginning of this year however her insurance would not pay for the medicine so she's been off it entirely and she is getting even worse. The physician also cut her Xanax down from 1 mg 4 times a day to 0.5 mg twice a day and she is exceedingly anxious.  Currently the patient states that she is depressed all the time, very anxious and tearful, irritable and angry. She can't stand to be around people and stays by herself with her dogs. She's trying to spend some time sewing but it's very hard for her to concentrate. Her sleep is poor and her mind races. She is again having the delusion  that she may have killed someone at the police may be coming to get her. She has nightmares about this. It's hard for her to sit still. She does not have any direct thoughts of hurting self or others. She constantly picks at her fingernails. She denies any current auditory or visual hallucinations  The patient returns after a long absence.  She has not been seen for 6 months.  In the interim she has had right shoulder surgery followed by neck surgery last month.  She states that she got off her psychiatric medications as he ran out about 3 months ago.  She did let us know that she was too disabled to come in to see [place] and did not ask for refills.  Since getting off her mood stabilizer she is gotten more manic.  She has been spending a lot of money and now spent down all her money and cannot make her utility bills.  She will be getting a check in 2 weeks and then can try to remedy the situation.  She is pleasant today and very neatly dressed but seems anxious.  She would like to get back to her medications at night explained that if she has difficulty getting here she needs to let us know so we can send in refills.  She denies suicidal ideation or psychotic symptoms Visit Diagnosis:    ICD-10-CM   1. Bipolar I disorder, most recent episode depressed ([place]) F31.30  Past Psychiatric History: Hospitalized twice in the 1980s, since then outpatient treatment for bipolar disorder  Past Medical History:  Past Medical History:  Diagnosis Date  . Anemia    hx  . Anxiety   . Arthritis    chronic back pain  . Bipolar disorder ([place])   . Cancer [facility]) Jan 2013   kidney cancer, left, s/p partial nephrectomy  . Depression   . Diarrhea    incontinent stools  . Diverticulitis    treated at least 5 times in past, hospitalized twice  . Heart murmur   . History of hiatal hernia    s/p  . MVP (mitral valve prolapse)   . Neuropathy   . Nocturia   . Osteoarthritis   . Osteoporosis   . PONV (postoperative  nausea and vomiting)   . Psychotic affective disorder ([place])    "psychotic delusions" "I cut myself"   . Restless leg syndrome   . Rheumatic fever   . Urinary frequency     Past Surgical History:  Procedure Laterality Date  . ABDOMINAL HYSTERECTOMY    . ANTERIOR CERVICAL DECOMP/DISCECTOMY FUSION N/A 09/28/2017   Procedure: ANTERIOR CERVICAL DECOMPRESSION/DISCECTOMY FUSION CERVICAL FOUR- CERVICAL FIVE, CERVICAL FIVE- CERVICAL SIX, CERVICAL SIX- CERVICAL SEVEN;  Surgeon: Eustace Moore, MD;  Location: [facility];  Service: Neurosurgery;  Laterality: N/A;  ANTERIOR CERVICAL DECOMPRESSION/DISCECTOMY FUSION CERVICAL FOUR- CERVICAL FIVE, CERVICAL FIVE- CERVICAL SIX, CERVICAL SIX- CERVICAL SEVEN  . BACK SURGERY    . BACK SURGERY     2 plates, 8 screws in back  . carpell tunnell     rt wrist  x2  . CATARACT EXTRACTION W/PHACO Left 06/14/2016   Procedure: CATARACT EXTRACTION PHACO AND INTRAOCULAR LENS PLACEMENT; CDE:  3.92;  Surgeon: Williams Che, MD;  Location: [facility];  Service: Ophthalmology;  Laterality: Left;  . CHOLECYSTECTOMY    . COLONOSCOPY  5/02   pancolonic deverticula, internal hemorrhoids  . COLONOSCOPY  1/11   single external hemorrhoidal tag and anal papilla otherwise normal rectum, pancolonic diverticula, s/p sigmoid biopsy and stool sampling all unremarkable  . ESOPHAGOGASTRODUODENOSCOPY  04/06/2010   intact Nissen fundoplication S/P dilation, somewhat baggy atonic appearing esophagus, 58-F dilation  . ESOPHAGOGASTRODUODENOSCOPY  1/11   nomral esophagus s/p 54-F Maloney dilation, normal/intact Nissen fundoplication, diffuse patchy erythema and erosions likely NSAID/ASA effect with benign biopsy  . EYE SURGERY     right eye cataract  . kidney surgery for cancer    . MAXIMUM ACCESS (MAS)POSTERIOR LUMBAR INTERBODY FUSION (PLIF) 1 LEVEL N/A 07/25/2014   Procedure: FOR MAXIMUM ACCESS (MAS) POSTERIOR LUMBAR INTERBODY FUSION (PLIF) Lumbar two/three, Removal of hardware Lumbar three/four;   Surgeon: Eustace Moore, MD;  Location: [facility];  Service: Neurosurgery;  Laterality: N/A;  . MAXIMUM ACCESS (MAS)POSTERIOR LUMBAR INTERBODY FUSION (PLIF) 2 LEVEL N/A 09/11/2015   Procedure: LUMBAR FOUR-FIVE LUMBAR FIVE SACRAL ONE  MAXIMUM ACCESS SURGERY, POSTERIOR LUMBAR INTERBODY FUSION , Removal of LUMBAR TWO TO LUMBAR FOUR  HARDWARE;  Surgeon: Eustace Moore, MD;  Location: [facility];  Service: Neurosurgery;  Laterality: N/A;  . NISSEN FUNDOPLICATION    . REVERSE SHOULDER ARTHROPLASTY Right 05/28/2016   Procedure: RIGHT REVERSE SHOULDER ARTHROPLASTY;  Surgeon: Netta Cedars, MD;  Location: [place];  Service: Orthopedics;  Laterality: Right;  . ROBOT ASSISTED LAPAROSCOPIC NEPHRECTOMY  08/23/2011   Procedure: ROBOTIC ASSISTED LAPAROSCOPIC NEPHRECTOMY;  Surgeon: Dutch Gray, MD;  Location: [facility];  Service: Urology;  Laterality: Left;  Left Robotic Assisted  Laparoscopic Partial Nephrectomy   . ROTATOR CUFF REPAIR     right side X 2  . TONSILLECTOMY    . TOTAL SHOULDER REVISION Right 08/19/2017   Procedure: RIGHT SHOULDER POLYETHYLENE EXCHANGE;  Surgeon: Netta Cedars, MD;  Location: [facility];  Service: Orthopedics;  Laterality: Right;    Family Psychiatric History: Daughter is also bipolar  Family History:  Family History  Problem Relation Age of Onset  . Bipolar disorder Daughter   . Colon cancer Neg Hx     Social History:  Social History   Socioeconomic History  . Marital status: Divorced    Spouse name: None  . Number of children: None  . Years of education: None  . Highest education level: None  Social Needs  . Financial resource strain: None  . Food insecurity - worry: None  . Food insecurity - inability: None  . Transportation needs - medical: None  . Transportation needs - non-medical: None  Occupational History  . None  Tobacco Use  . Smoking status: Never Smoker  . Smokeless tobacco: Never Used  Substance and Sexual Activity  . Alcohol use: No  . Drug use: No  .  Sexual activity: Not Currently  Other Topics Concern  . None  Social History Narrative  . None    Allergies:  Allergies  Allergen Reactions  . Shellfish Allergy Anaphylaxis  . Neurontin [Gabapentin] Other (See Comments)    Unable to talk when takes medication  . Chlorhexidine Gluconate Rash and Other (See Comments)    burning  . Codeine Itching     itching  . Haldol [Haloperidol Lactate] Other (See Comments)    Muscle spasms  . Hydromorphone Hcl Nausea And Vomiting  . Latex Rash    adhesives  . Propoxyphene N-Acetaminophen Nausea And Vomiting  . Vicodin [Hydrocodone-Acetaminophen] Itching    Metabolic Disorder Labs: No results found for: HGBA1C, MPG No results found for: PROLACTIN No results found for: CHOL, TRIG, HDL, CHOLHDL, VLDL, LDLCALC Lab Results  Component Value Date   TSH 2.25 01/28/2016    Therapeutic Level Labs: No results found for: LITHIUM No results found for: VALPROATE No components found for:  CBMZ  Current Medications: Current Outpatient Medications  Medication Sig Dispense Refill  . ALPRAZolam (XANAX) 1 MG tablet Take 1 tablet (1 mg total) by mouth 4 (four) times daily. 120 tablet 2  . atorvastatin (LIPITOR) 20 MG tablet TAKE 1 TABLET (20mg ) BY MOUTH EVERYDAY AT BEDTIME  3  . cholestyramine (QUESTRAN) 4 GM/DOSE powder TAKE 1 SCOOPFUL (4 G TOTAL) BY MOUTH DAILY. DO NOT TAKE WITHIN 2 HOURS OF OTHER MEDICATIONS 378 g 5  . FLUoxetine (PROZAC) 40 MG capsule Take 1 capsule (40 mg total) by mouth 2 (two) times daily. 60 capsule 2  . methocarbamol (ROBAXIN) 500 MG tablet Take 1 tablet (500 mg total) by mouth every 6 (six) hours as needed for muscle spasms. 30 tablet 0  . Multiple Vitamin (MULTIVITAMIN WITH MINERALS) TABS Take 2 tablets by mouth every evening.     . Multiple Vitamins-Minerals (HAIR/SKIN/NAILS) CAPS Take 2 tablets by mouth daily.    . naproxen sodium (ALEVE) 220 MG tablet Take 440 mg by mouth 2 (two) times daily as needed (pain).    .  nitroGLYCERIN (NITROSTAT) 0.4 MG SL tablet Place 0.4 mg under the tongue every 5 (five) minutes as needed for chest pain.    Marland Kitchen OLANZapine (ZYPREXA) 20 MG tablet Take 1 tablet (20 mg total) by mouth at bedtime. 30 tablet  2  . oxybutynin (DITROPAN XL) 15 MG 24 hr tablet Take 15 mg by mouth daily.  11  . oxyCODONE-acetaminophen (ROXICET) 5-325 MG tablet Take 1 tablet by mouth every 6 (six) hours as needed. 20 tablet 0  . rOPINIRole (REQUIP) 3 MG tablet TAKE 1 TABLET (3mg ) BY MOUTH 3 TIMES DAILY.  11  . traZODone (DESYREL) 100 MG tablet Take 2 tablets (200 mg total) by mouth at bedtime. 60 tablet 2  . Vitamin D, Ergocalciferol, (DRISDOL) 50000 units CAPS capsule Take 50,000 Units by mouth every 7 (seven) days. Fridays    . meloxicam (MOBIC) 7.5 MG tablet Take 7.5 mg by mouth 2 (two) times daily.     No current facility-administered medications for this visit.      Musculoskeletal: Strength & Muscle Tone: within normal limits Gait & Station: normal Patient leans: N/A  Psychiatric Specialty Exam: Review of Systems  Musculoskeletal: Positive for neck pain.  Psychiatric/Behavioral: Positive for depression. The patient is nervous/anxious and has insomnia.   All other systems reviewed and are negative.   Blood pressure (!) 144/88, pulse 84, height 5\' 5"  (1.651 m), weight 184 lb (83.5 kg), SpO2 94 %.Body mass index is 30.62 kg/m.  General Appearance: Casual, Neat and Well Groomed  Eye Contact:  Good  Speech:  Clear and Coherent  Volume:  Normal  Mood:  Anxious  Affect:  Constricted  Thought Process:  Goal Directed  Orientation:  Full (Time, Place, and Person)  Thought Content: Rumination   Suicidal Thoughts:  No  Homicidal Thoughts:  No  Memory:  Immediate;   Good Recent;   Good Remote;   Good  Judgement:  Poor  Insight:  Lacking  Psychomotor Activity:  Decreased  Concentration:  Concentration: Fair and Attention Span: Fair  Recall:  Good  Fund of Knowledge: Good  Language: Good   Akathisia:  No  Handed:  Right  AIMS (if indicated): not done  Assets:  Communication Skills Desire for Improvement Resilience Social Support Talents/Skills  ADL's:  Intact  Cognition: WNL  Sleep:  Poor   Screenings:   Assessment and Plan: Patient is a 72 year old female with a history of bipolar disorder.  Unfortunately she got off her medications for about 3 months and has become hypomanic and overspending.  She does not seem psychotic depressed or suicidal.  However she needs to get back on her medications.  She will reinstate trazodone 200 mg at bedtime for sleep, Prozac 40 mg twice a day for depression and obsessional behaviors, olanzapine 20 mg daily at bedtime for mood stabilization and Xanax 1 mg 4 times a day for anxiety.  She will get into to therapy here again and return to see me in 6 weeks   Levonne Spiller, MD 11/30/2017, 3:03 PM

## 2018-01-11 ENCOUNTER — Ambulatory Visit (HOSPITAL_COMMUNITY): Payer: Medicare Other | Admitting: Psychiatry

## 2018-01-20 IMAGING — RF DG CERVICAL SPINE 1V
1 series · 2 of 2 positions shown · non-contrast
Comparison: None.

CLINICAL DATA: ACDF

EXAM:
DG C-ARM 61-120 MIN; DG CERVICAL SPINE - 1 VIEW

[Series 1: run · 2 of 2 slices shown]
[im 1/2]
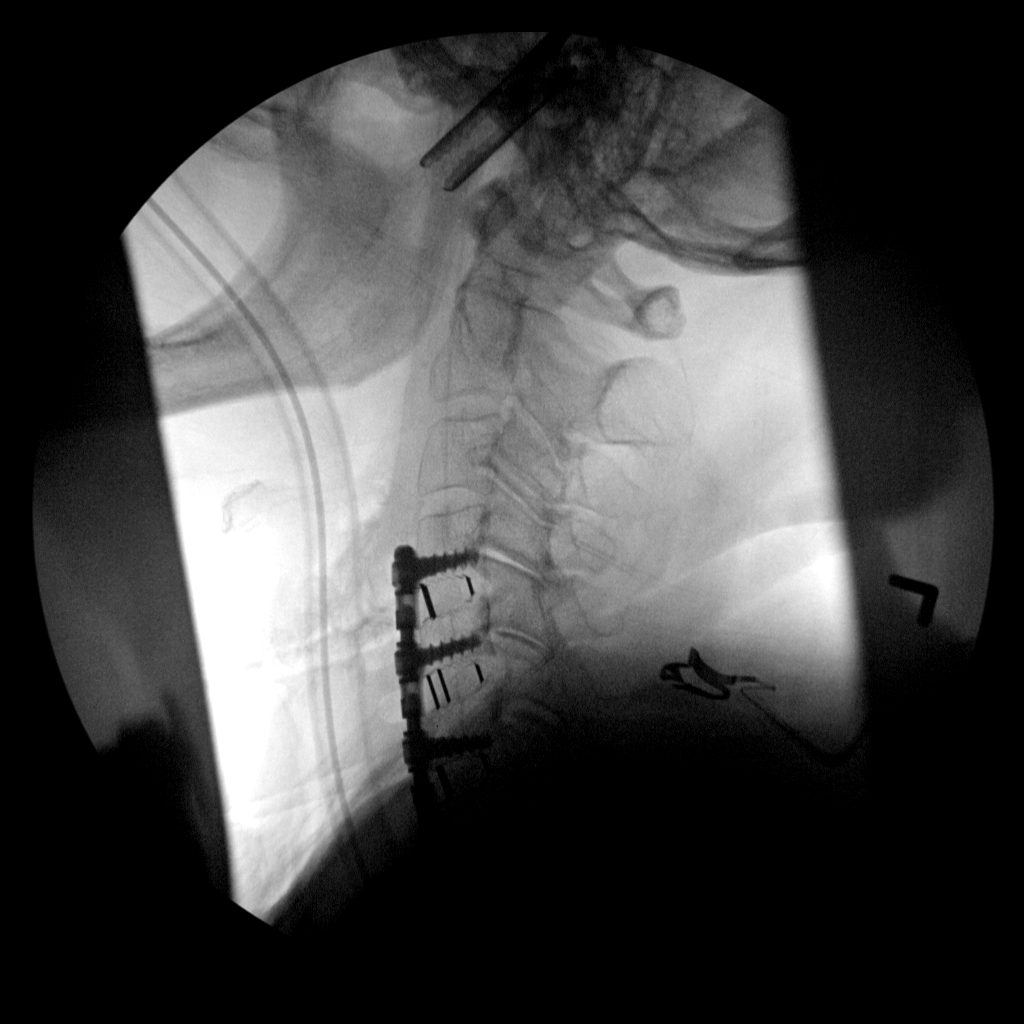
[im 2/2]
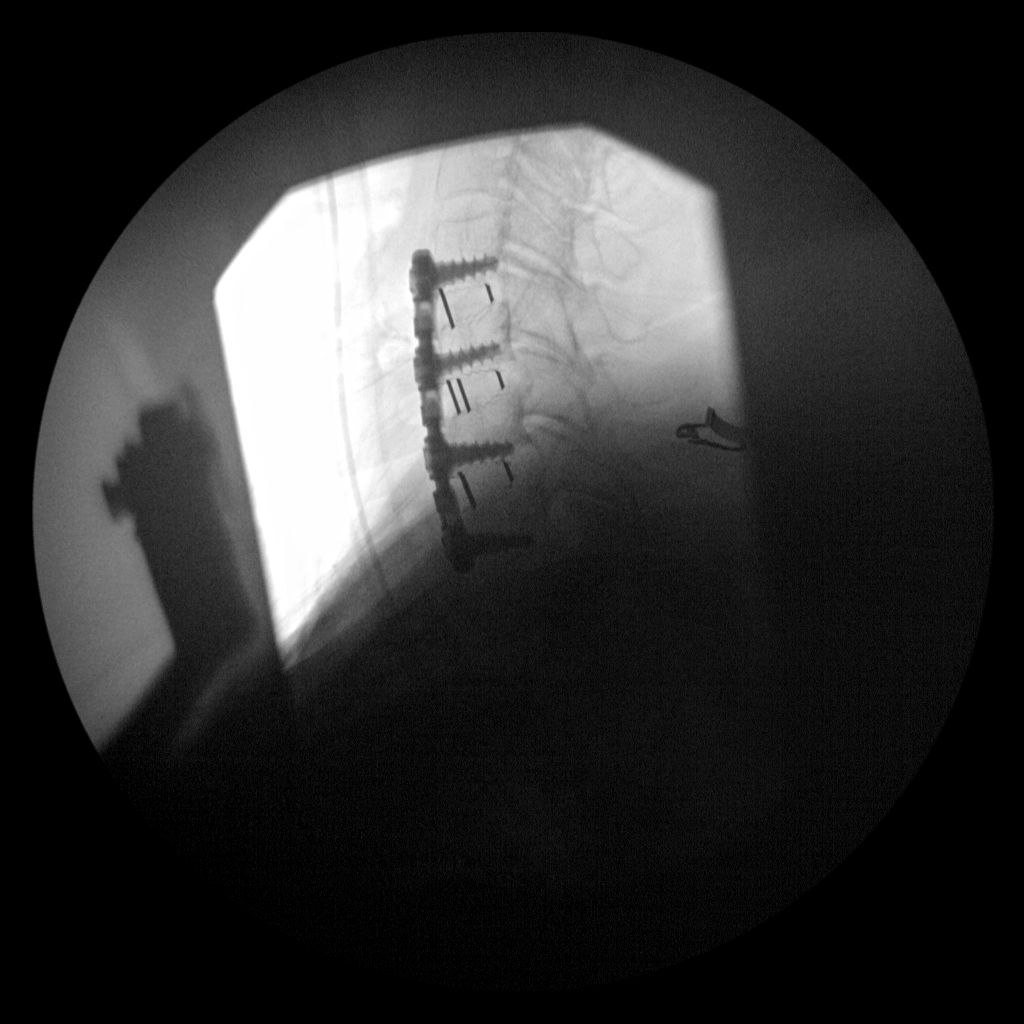

[2 of 2 positions shown; findings below may reference images not displayed]

FINDINGS: Two intraoperative spot images demonstrate changes of ACDF from
C4-C7. Normal alignment. No visible hardware or bony complicating
feature.
IMPRESSION: C4-C7 ACDF.  No visible complicating feature.

## 2018-01-25 ENCOUNTER — Ambulatory Visit (HOSPITAL_COMMUNITY): Payer: Self-pay | Admitting: Psychiatry

## 2018-01-31 ENCOUNTER — Ambulatory Visit: Payer: Medicare Other | Admitting: Gastroenterology

## 2018-02-14 ENCOUNTER — Ambulatory Visit: Payer: Self-pay | Admitting: Urology

## 2018-02-25 ENCOUNTER — Other Ambulatory Visit (HOSPITAL_COMMUNITY): Payer: Self-pay | Admitting: Psychiatry

## 2018-03-10 ENCOUNTER — Other Ambulatory Visit (HOSPITAL_COMMUNITY): Payer: Self-pay | Admitting: Psychiatry

## 2018-03-10 MED ORDER — ALPRAZOLAM 1 MG PO TABS: 1.0000 mg | ORAL_TABLET | Freq: Four times a day (QID) | ORAL | 0 refills | Status: DC

## 2018-03-20 DIAGNOSIS — N39498 Other specified urinary incontinence: Secondary | ICD-10-CM | POA: Diagnosis not present

## 2018-03-20 DIAGNOSIS — L905 Scar conditions and fibrosis of skin: Secondary | ICD-10-CM | POA: Diagnosis not present

## 2018-03-20 DIAGNOSIS — Z6832 Body mass index (BMI) 32.0-32.9, adult: Secondary | ICD-10-CM | POA: Diagnosis not present

## 2018-03-20 DIAGNOSIS — E6609 Other obesity due to excess calories: Secondary | ICD-10-CM | POA: Diagnosis not present

## 2018-03-20 DIAGNOSIS — Z1389 Encounter for screening for other disorder: Secondary | ICD-10-CM | POA: Diagnosis not present

## 2018-03-27 ENCOUNTER — Other Ambulatory Visit (HOSPITAL_COMMUNITY): Payer: Self-pay | Admitting: Psychiatry

## 2018-03-27 MED ORDER — OLANZAPINE 20 MG PO TABS: 20.0000 mg | ORAL_TABLET | Freq: Every day | ORAL | 2 refills | Status: DC

## 2018-03-27 MED ORDER — TRAZODONE HCL 100 MG PO TABS: 200.0000 mg | ORAL_TABLET | Freq: Every day | ORAL | 2 refills | Status: DC

## 2018-03-27 MED ORDER — FLUOXETINE HCL 40 MG PO CAPS: 40.0000 mg | ORAL_CAPSULE | Freq: Two times a day (BID) | ORAL | 2 refills | Status: DC

## 2018-03-27 MED ORDER — ALPRAZOLAM 1 MG PO TABS: 1.0000 mg | ORAL_TABLET | Freq: Four times a day (QID) | ORAL | 2 refills | Status: DC

## 2018-03-29 ENCOUNTER — Telehealth: Payer: Self-pay

## 2018-03-29 MED ORDER — CHOLESTYRAMINE 4 GM/DOSE PO POWD: ORAL | 5 refills | Status: DC

## 2018-03-29 NOTE — Telephone Encounter (Signed)
Pt needs Rx for Cholestyramine send to [facility]. I have entered that pharmacy.

## 2018-04-03 ENCOUNTER — Other Ambulatory Visit (HOSPITAL_COMMUNITY): Payer: Self-pay | Admitting: Psychiatry

## 2018-04-07 ENCOUNTER — Ambulatory Visit (HOSPITAL_COMMUNITY): Payer: Self-pay | Admitting: Psychiatry

## 2018-04-17 DIAGNOSIS — M542 Cervicalgia: Secondary | ICD-10-CM | POA: Diagnosis not present

## 2018-04-21 ENCOUNTER — Other Ambulatory Visit (HOSPITAL_COMMUNITY): Payer: Self-pay | Admitting: Psychiatry

## 2018-05-01 ENCOUNTER — Telehealth: Payer: Self-pay | Admitting: Gastroenterology

## 2018-05-01 ENCOUNTER — Encounter: Payer: Self-pay | Admitting: Gastroenterology

## 2018-05-01 ENCOUNTER — Ambulatory Visit: Payer: Medicare Other | Admitting: Gastroenterology

## 2018-05-01 NOTE — Telephone Encounter (Signed)
Patient was a no show and letter sent  °

## 2018-05-03 ENCOUNTER — Ambulatory Visit (HOSPITAL_COMMUNITY): Payer: Self-pay | Admitting: Psychiatry

## 2018-05-15 ENCOUNTER — Ambulatory Visit (INDEPENDENT_AMBULATORY_CARE_PROVIDER_SITE_OTHER): Payer: Medicare Other | Admitting: Psychiatry

## 2018-05-15 ENCOUNTER — Encounter (HOSPITAL_COMMUNITY): Payer: Self-pay | Admitting: Psychiatry

## 2018-05-15 VITALS — BP 150/82 | HR 94 | Ht 65.0 in | Wt 189.0 lb

## 2018-05-15 DIAGNOSIS — Z79899 Other long term (current) drug therapy: Secondary | ICD-10-CM

## 2018-05-15 DIAGNOSIS — F3130 Bipolar disorder, current episode depressed, mild or moderate severity, unspecified: Secondary | ICD-10-CM

## 2018-05-15 MED ORDER — FLUOXETINE HCL 40 MG PO CAPS: 40.0000 mg | ORAL_CAPSULE | Freq: Two times a day (BID) | ORAL | 2 refills | Status: DC

## 2018-05-15 MED ORDER — OLANZAPINE 20 MG PO TABS: 20.0000 mg | ORAL_TABLET | Freq: Every day | ORAL | 2 refills | Status: DC

## 2018-05-15 MED ORDER — ALPRAZOLAM 1 MG PO TABS: 1.0000 mg | ORAL_TABLET | Freq: Four times a day (QID) | ORAL | 2 refills | Status: DC

## 2018-05-15 MED ORDER — TRAZODONE HCL 100 MG PO TABS: 100.0000 mg | ORAL_TABLET | Freq: Every day | ORAL | 2 refills | Status: DC

## 2018-05-15 NOTE — Progress Notes (Signed)
BH MD/PA/NP OP Progress Note  05/15/2018 9:11 AM Victoria Mccarthy  MRN:  [id number]  Chief Complaint:  Chief Complaint    Depression; Manic Behavior; Anxiety; Follow-up     HPI: this patient is a 72 year old divorced white female who lives alone in [place]. She has one son, one daughter, 6 grandchildren 3 great-grandchildren. 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  The patient states that she's had problems with mental illness since her 16s. She was hospitalized at [facility] in [facility] twice in the 1980s. During these times she was depressed extremely anxious and having paranoid delusions that she had killed someone and that the police were after her. At one point she was hearing voices telling her to hurt her self. She finally started seeing Dr. Charmayne Sheer private practitioner in [place] who treated her for years. In the past she's been on Prozac, Symbyax, Xanax, Valium, BuSpar and lithium.  After her psychiatrist retired, she began going to the [facility] but got tired of waiting for hours for recheck visit. She most recently she's been going to [place] [facility]. The psychiatrist there has cut down her medication. She used to be on Symbiax 6/50-2 a day and she was cut down to 4-25, once a day. Since this happened she become extremely anxious and depressed. At the beginning of this year however her insurance would not pay for the medicine so she's been off it entirely and she is getting even worse. The physician also cut her Xanax down from 1 mg 4 times a day to 0.5 mg twice a day and she is exceedingly anxious.  Currently the patient states that she is depressed all the time, very anxious and tearful, irritable and angry. She can't stand to be around people and stays by herself with her dogs. She's trying to spend some time sewing but it's very hard for her to concentrate. Her sleep is poor and her mind races. She is again having the delusion that she  may have killed someone at the police may be coming to get her. She has nightmares about this. It's hard for her to sit still. She does not have any direct thoughts of hurting self or others. She constantly picks at her fingernails. She denies any current auditory or visual hallucinations  The patient returns after 6 months.  She has missed some appointments.  She states that overall she is doing better.  She feels very oversedated from taking the trazodone 200 mg at bedtime.  In fact she seems drowsy this morning.  I suggested she go back down to 100 mg and she agrees.  Her ex-husband has moved to his trailer under a property and he is helping her with a lot of things like home clean up and getting her mother's house ready to rent.  They do things together almost like an old married couple but she does not want to get back into a marriage with him.  She is comfortable "being best friends."  This seems to have brightened her up quite a bit to have someone to be with.  She denies depression or manic symptoms currently or suicidal ideation  Visit Diagnosis:    ICD-10-CM   1. Bipolar I disorder, most recent episode depressed ([place]) F31.30     Past Psychiatric History: Hospitalized twice in the 1980s, since then outpatient treatment for bipolar disorder  Past Medical History:  Past Medical History:  Diagnosis Date  . Anemia  hx  . Anxiety   . Arthritis    chronic back pain  . Bipolar disorder ([place])   . Cancer [facility]) Jan 2013   kidney cancer, left, s/p partial nephrectomy  . Depression   . Diarrhea    incontinent stools  . Diverticulitis    treated at least 5 times in past, hospitalized twice  . Heart murmur   . History of hiatal hernia    s/p  . MVP (mitral valve prolapse)   . Neuropathy   . Nocturia   . Osteoarthritis   . Osteoporosis   . PONV (postoperative nausea and vomiting)   . Psychotic affective disorder ([place])    "psychotic delusions" "I cut myself"   . Restless leg syndrome    . Rheumatic fever   . Urinary frequency     Past Surgical History:  Procedure Laterality Date  . ABDOMINAL HYSTERECTOMY    . ANTERIOR CERVICAL DECOMP/DISCECTOMY FUSION N/A 09/28/2017   Procedure: ANTERIOR CERVICAL DECOMPRESSION/DISCECTOMY FUSION CERVICAL FOUR- CERVICAL FIVE, CERVICAL FIVE- CERVICAL SIX, CERVICAL SIX- CERVICAL SEVEN;  Surgeon: Eustace Moore, MD;  Location: [facility];  Service: Neurosurgery;  Laterality: N/A;  ANTERIOR CERVICAL DECOMPRESSION/DISCECTOMY FUSION CERVICAL FOUR- CERVICAL FIVE, CERVICAL FIVE- CERVICAL SIX, CERVICAL SIX- CERVICAL SEVEN  . BACK SURGERY    . BACK SURGERY     2 plates, 8 screws in back  . carpell tunnell     rt wrist  x2  . CATARACT EXTRACTION W/PHACO Left 06/14/2016   Procedure: CATARACT EXTRACTION PHACO AND INTRAOCULAR LENS PLACEMENT; CDE:  3.92;  Surgeon: Williams Che, MD;  Location: [facility];  Service: Ophthalmology;  Laterality: Left;  . CHOLECYSTECTOMY    . COLONOSCOPY  5/02   pancolonic deverticula, internal hemorrhoids  . COLONOSCOPY  1/11   single external hemorrhoidal tag and anal papilla otherwise normal rectum, pancolonic diverticula, s/p sigmoid biopsy and stool sampling all unremarkable  . ESOPHAGOGASTRODUODENOSCOPY  04/06/2010   intact Nissen fundoplication S/P dilation, somewhat baggy atonic appearing esophagus, 58-F dilation  . ESOPHAGOGASTRODUODENOSCOPY  1/11   nomral esophagus s/p 54-F Maloney dilation, normal/intact Nissen fundoplication, diffuse patchy erythema and erosions likely NSAID/ASA effect with benign biopsy  . EYE SURGERY     right eye cataract  . kidney surgery for cancer    . MAXIMUM ACCESS (MAS)POSTERIOR LUMBAR INTERBODY FUSION (PLIF) 1 LEVEL N/A 07/25/2014   Procedure: FOR MAXIMUM ACCESS (MAS) POSTERIOR LUMBAR INTERBODY FUSION (PLIF) Lumbar two/three, Removal of hardware Lumbar three/four;  Surgeon: Eustace Moore, MD;  Location: [facility];  Service: Neurosurgery;  Laterality: N/A;  . MAXIMUM ACCESS (MAS)POSTERIOR  LUMBAR INTERBODY FUSION (PLIF) 2 LEVEL N/A 09/11/2015   Procedure: LUMBAR FOUR-FIVE LUMBAR FIVE SACRAL ONE  MAXIMUM ACCESS SURGERY, POSTERIOR LUMBAR INTERBODY FUSION , Removal of LUMBAR TWO TO LUMBAR FOUR  HARDWARE;  Surgeon: Eustace Moore, MD;  Location: [facility];  Service: Neurosurgery;  Laterality: N/A;  . NISSEN FUNDOPLICATION    . REVERSE SHOULDER ARTHROPLASTY Right 05/28/2016   Procedure: RIGHT REVERSE SHOULDER ARTHROPLASTY;  Surgeon: Netta Cedars, MD;  Location: [facility];  Service: Orthopedics;  Laterality: Right;  . ROBOT ASSISTED LAPAROSCOPIC NEPHRECTOMY  08/23/2011   Procedure: ROBOTIC ASSISTED LAPAROSCOPIC NEPHRECTOMY;  Surgeon: Dutch Gray, MD;  Location: [facility];  Service: Urology;  Laterality: Left;  Left Robotic Assisted  Laparoscopic Partial Nephrectomy   . ROTATOR CUFF REPAIR     right side X 2  . TONSILLECTOMY    . TOTAL SHOULDER REVISION Right 08/19/2017   Procedure:  RIGHT SHOULDER POLYETHYLENE EXCHANGE;  Surgeon: Netta Cedars, MD;  Location: [facility];  Service: Orthopedics;  Laterality: Right;    Family Psychiatric History: See below  Family History:  Family History  Problem Relation Age of Onset  . Bipolar disorder Daughter   . Colon cancer Neg Hx     Social History:  Social History   Socioeconomic History  . Marital status: Divorced    Spouse name: Not on file  . Number of children: Not on file  . Years of education: Not on file  . Highest education level: Not on file  Occupational History  . Not on file  Social Needs  . Financial resource strain: Not on file  . Food insecurity:    Worry: Not on file    Inability: Not on file  . Transportation needs:    Medical: Not on file    Non-medical: Not on file  Tobacco Use  . Smoking status: Never Smoker  . Smokeless tobacco: Never Used  Substance and Sexual Activity  . Alcohol use: No  . Drug use: No  . Sexual activity: Not Currently  Lifestyle  . Physical activity:    Days per week: Not on file    Minutes  per session: Not on file  . Stress: Not on file  Relationships  . Social connections:    Talks on phone: Not on file    Gets together: Not on file    Attends religious service: Not on file    Active member of club or organization: Not on file    Attends meetings of clubs or organizations: Not on file    Relationship status: Not on file  Other Topics Concern  . Not on file  Social History Narrative  . Not on file    Allergies:  Allergies  Allergen Reactions  . Shellfish Allergy Anaphylaxis  . Neurontin [Gabapentin] Other (See Comments)    Unable to talk when takes medication  . Chlorhexidine Gluconate Rash and Other (See Comments)    burning  . Codeine Itching     itching  . Haldol [Haloperidol Lactate] Other (See Comments)    Muscle spasms  . Hydromorphone Hcl Nausea And Vomiting  . Latex Rash    adhesives  . Propoxyphene N-Acetaminophen Nausea And Vomiting  . Vicodin [Hydrocodone-Acetaminophen] Itching    Metabolic Disorder Labs: No results found for: HGBA1C, MPG No results found for: PROLACTIN No results found for: CHOL, TRIG, HDL, CHOLHDL, VLDL, LDLCALC Lab Results  Component Value Date   TSH 2.25 01/28/2016    Therapeutic Level Labs: No results found for: LITHIUM No results found for: VALPROATE No components found for:  CBMZ  Current Medications: Current Outpatient Medications  Medication Sig Dispense Refill  . ALPRAZolam (XANAX) 1 MG tablet Take 1 tablet (1 mg total) by mouth 4 (four) times daily. 120 tablet 2  . atorvastatin (LIPITOR) 20 MG tablet TAKE 1 TABLET (20mg ) BY MOUTH EVERYDAY AT BEDTIME  3  . cholestyramine (QUESTRAN) 4 GM/DOSE powder TAKE 1 SCOOPFUL (4 G TOTAL) BY MOUTH DAILY. DO NOT TAKE WITHIN 2 HOURS OF OTHER MEDICATIONS 378 g 5  . FLUoxetine (PROZAC) 40 MG capsule Take 1 capsule (40 mg total) by mouth 2 (two) times daily. 180 capsule 2  . meloxicam (MOBIC) 7.5 MG tablet Take 7.5 mg by mouth 2 (two) times daily.    . methocarbamol (ROBAXIN)  500 MG tablet Take 1 tablet (500 mg total) by mouth every 6 (six) hours as needed for muscle  spasms. 30 tablet 0  . Multiple Vitamin (MULTIVITAMIN WITH MINERALS) TABS Take 2 tablets by mouth every evening.     . Multiple Vitamins-Minerals (HAIR/SKIN/NAILS) CAPS Take 2 tablets by mouth daily.    . naproxen sodium (ALEVE) 220 MG tablet Take 440 mg by mouth 2 (two) times daily as needed (pain).    . nitroGLYCERIN (NITROSTAT) 0.4 MG SL tablet Place 0.4 mg under the tongue every 5 (five) minutes as needed for chest pain.    Marland Kitchen OLANZapine (ZYPREXA) 20 MG tablet Take 1 tablet (20 mg total) by mouth at bedtime. 90 tablet 2  . oxybutynin (DITROPAN XL) 15 MG 24 hr tablet Take 15 mg by mouth daily.  11  . oxyCODONE-acetaminophen (ROXICET) 5-325 MG tablet Take 1 tablet by mouth every 6 (six) hours as needed. 20 tablet 0  . rOPINIRole (REQUIP) 3 MG tablet TAKE 1 TABLET (3mg ) BY MOUTH 3 TIMES DAILY.  11  . traZODone (DESYREL) 100 MG tablet Take 1 tablet (100 mg total) by mouth at bedtime. 90 tablet 2  . Vitamin D, Ergocalciferol, (DRISDOL) 50000 units CAPS capsule Take 50,000 Units by mouth every 7 (seven) days. Fridays     No current facility-administered medications for this visit.      Musculoskeletal: Strength & Muscle Tone: within normal limits Gait & Station: normal Patient leans: N/A  Psychiatric Specialty Exam: Review of Systems  Constitutional: Positive for malaise/fatigue.  Musculoskeletal: Positive for back pain.    Blood pressure (!) 150/82, pulse 94, height 5\' 5"  (1.651 m), weight 189 lb (85.7 kg), SpO2 96 %.Body mass index is 31.45 kg/m.  General Appearance: Casual, Neat and Well Groomed  Eye Contact:  Good  Speech:  Clear and Coherent  Volume:  Normal  Mood:  Euthymic  Affect:  Flat  Thought Process:  Goal Directed  Orientation:  Full (Time, Place, and Person)  Thought Content: Logical   Suicidal Thoughts:  No  Homicidal Thoughts:  No  Memory:  Immediate;   Good Recent;    Good Remote;   Good  Judgement:  Fair  Insight:  Fair  Psychomotor Activity:  Decreased  Concentration:  Concentration: Good and Attention Span: Good  Recall:  Good  Fund of Knowledge: Good  Language: Fair  Akathisia:  No  Handed:  Right  AIMS (if indicated): not done  Assets:  Communication Skills Desire for Improvement Resilience Social Support Talents/Skills  ADL's:  Intact  Cognition: WNL  Sleep:  Good   Screenings:   Assessment and Plan: This patient is a 72 year old female with a history of bipolar disorder.  She seems to be doing better now that she has someone living near her that she can be with.  She does seem oversedated from trazodone so she will decrease this to 100 mg at bedtime for sleep.  She will continue Prozac 40 mg twice daily for depression, olanzapine 20 mg at bedtime for mood stabilization and Xanax 1 mg 4 times daily for anxiety.  She will return to see me in 3 months   Levonne Spiller, MD 05/15/2018, 9:11 AM

## 2018-06-27 ENCOUNTER — Encounter (HOSPITAL_COMMUNITY): Payer: Self-pay | Admitting: Physical Therapy

## 2018-06-27 NOTE — Therapy (Signed)
[facility] [address] [place], [place], [address] Phone: [phone]   Fax:  [phone]  Patient Details  Name: DENISSA COZART MRN: [id number] Date of Birth: Aug 01, 1946 Referring Provider:  Sherley Bounds   Encounter Date: 06/27/2018   PHYSICAL THERAPY DISCHARGE SUMMARY  Visits from Start of Care: 7  Current functional level related to goals / functional outcomes: Unknown as pt did not return    Remaining deficits: Unknown as pt did not return    Education / Equipment: HEP Plan: Patient agrees to discharge.  Patient goals were partially met. Patient is being discharged due to not returning since the last visit.  ?????     Rayetta Humphrey, PT CLT [phone] 06/27/2018, 2:02 PM  [facility] [address] [place], [place], [address] Phone: [phone]   Fax:  [phone]

## 2018-06-29 DIAGNOSIS — Z23 Encounter for immunization: Secondary | ICD-10-CM | POA: Diagnosis not present

## 2018-08-15 ENCOUNTER — Ambulatory Visit (HOSPITAL_COMMUNITY): Payer: Self-pay | Admitting: Psychiatry

## 2018-08-22 ENCOUNTER — Ambulatory Visit: Payer: Self-pay | Admitting: Urology

## 2018-08-24 DIAGNOSIS — Z0000 Encounter for general adult medical examination without abnormal findings: Secondary | ICD-10-CM | POA: Diagnosis not present

## 2018-08-24 DIAGNOSIS — Z0001 Encounter for general adult medical examination with abnormal findings: Secondary | ICD-10-CM | POA: Diagnosis not present

## 2018-08-24 DIAGNOSIS — Z6832 Body mass index (BMI) 32.0-32.9, adult: Secondary | ICD-10-CM | POA: Diagnosis not present

## 2018-08-24 DIAGNOSIS — Z1389 Encounter for screening for other disorder: Secondary | ICD-10-CM | POA: Diagnosis not present

## 2018-08-24 DIAGNOSIS — E6609 Other obesity due to excess calories: Secondary | ICD-10-CM | POA: Diagnosis not present

## 2018-08-24 DIAGNOSIS — I10 Essential (primary) hypertension: Secondary | ICD-10-CM | POA: Diagnosis not present

## 2018-10-03 DIAGNOSIS — M546 Pain in thoracic spine: Secondary | ICD-10-CM | POA: Diagnosis not present

## 2018-10-03 DIAGNOSIS — M542 Cervicalgia: Secondary | ICD-10-CM | POA: Diagnosis not present

## 2018-10-11 ENCOUNTER — Ambulatory Visit (HOSPITAL_COMMUNITY): Payer: Medicare Other | Admitting: Psychiatry

## 2018-10-12 ENCOUNTER — Ambulatory Visit (HOSPITAL_COMMUNITY): Payer: Medicare Other | Admitting: Psychiatry

## 2018-10-12 ENCOUNTER — Ambulatory Visit (INDEPENDENT_AMBULATORY_CARE_PROVIDER_SITE_OTHER): Payer: Medicare Other | Admitting: Psychiatry

## 2018-10-12 ENCOUNTER — Encounter (HOSPITAL_COMMUNITY): Payer: Self-pay | Admitting: Psychiatry

## 2018-10-12 VITALS — BP 126/80 | HR 85 | Ht 65.0 in | Wt 186.0 lb

## 2018-10-12 DIAGNOSIS — F3130 Bipolar disorder, current episode depressed, mild or moderate severity, unspecified: Secondary | ICD-10-CM

## 2018-10-12 MED ORDER — FLUOXETINE HCL 40 MG PO CAPS: 40.0000 mg | ORAL_CAPSULE | Freq: Two times a day (BID) | ORAL | 2 refills | Status: DC

## 2018-10-12 MED ORDER — OLANZAPINE 20 MG PO TABS: 20.0000 mg | ORAL_TABLET | Freq: Every day | ORAL | 2 refills | Status: DC

## 2018-10-12 MED ORDER — ALPRAZOLAM 1 MG PO TABS: 1.0000 mg | ORAL_TABLET | Freq: Four times a day (QID) | ORAL | 2 refills | Status: DC

## 2018-10-12 MED ORDER — TRAZODONE HCL 100 MG PO TABS: 100.0000 mg | ORAL_TABLET | Freq: Every day | ORAL | 2 refills | Status: DC

## 2018-10-12 NOTE — Progress Notes (Signed)
[facility] MD/PA/NP OP Progress Note  10/12/2018 2:37 PM Victoria Mccarthy  MRN:  [id number]  Chief Complaint:  Chief Complaint    Anxiety; Depression; Manic Behavior; Memory Loss; Follow-up     HPI: this patient is a 73 year old divorced white female who lives alone in [place]. She has one son, one daughter, 6 grandchildren 3 great-grandchildren. 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  The patient states that she's had problems with mental illness since her 31s. She was hospitalized at [facility] in [facility] twice in the 1980s. During these times she was depressed extremely anxious and having paranoid delusions that she had killed someone and that the police were after her. At one point she was hearing voices telling her to hurt her self. She finally started seeing Dr. Charmayne Sheer private practitioner in [place] who treated her for years. In the past she's been on Prozac, Symbyax, Xanax, Valium, BuSpar and lithium.  After her psychiatrist retired, she began going to the [facility] but got tired of waiting for hours for recheck visit. She most recently she's been going to [place] [facility]. The psychiatrist there has cut down her medication. She used to be on Symbiax 6/50-2 a day and she was cut down to 4-25, once a day. Since this happened she become extremely anxious and depressed. At the beginning of this year however her insurance would not pay for the medicine so she's been off it entirely and she is getting even worse. The physician also cut her Xanax down from 1 mg 4 times a day to 0.5 mg twice a day and she is exceedingly anxious.  Currently the patient states that she is depressed all the time, very anxious and tearful, irritable and angry. She can't stand to be around people and stays by herself with her dogs. She's trying to spend some time sewing but it's very hard for her to concentrate. Her sleep is poor and her mind races. She is again having the  delusion that she may have killed someone at the police may be coming to get her. She has nightmares about this. It's hard for her to sit still. She does not have any direct thoughts of hurting self or others. She constantly picks at her fingernails. She denies any current auditory or visual hallucinations  The patient returns after 6 months.  She again has missed some appointments.  Overall she states she is doing okay.  She still staying in her mother's house and her ex-husband is living nearby and helping her out.  She still cannot bring herself to get rid of her mother's belongings even though she died 2 years ago.  She states that her mood is been stable and she is taking a stained-glass class that she really enjoys.  She is generally sleeping well at night.  She denies being anxious or angry or agitated.  She does note that she has had a lot of short-term memory loss lately.  She is not on any new medication.  I suggested she mention this to primary care and perhaps look at getting a neurology evaluation. Visit Diagnosis:    ICD-10-CM   1. Bipolar I disorder, most recent episode depressed ([place]) F31.30     Past Psychiatric History: Hospitalized twice in the 1980s, since then outpatient treatment for bipolar disorder  Past Medical History:  Past Medical History:  Diagnosis Date  . Anemia    hx  . Anxiety   . Arthritis  chronic back pain  . Bipolar disorder ([place])   . Cancer [facility]) Jan 2013   kidney cancer, left, s/p partial nephrectomy  . Depression   . Diarrhea    incontinent stools  . Diverticulitis    treated at least 5 times in past, hospitalized twice  . Heart murmur   . History of hiatal hernia    s/p  . MVP (mitral valve prolapse)   . Neuropathy   . Nocturia   . Osteoarthritis   . Osteoporosis   . PONV (postoperative nausea and vomiting)   . Psychotic affective disorder ([place])    "psychotic delusions" "I cut myself"   . Restless leg syndrome   . Rheumatic fever   .  Urinary frequency     Past Surgical History:  Procedure Laterality Date  . ABDOMINAL HYSTERECTOMY    . ANTERIOR CERVICAL DECOMP/DISCECTOMY FUSION N/A 09/28/2017   Procedure: ANTERIOR CERVICAL DECOMPRESSION/DISCECTOMY FUSION CERVICAL FOUR- CERVICAL FIVE, CERVICAL FIVE- CERVICAL SIX, CERVICAL SIX- CERVICAL SEVEN;  Surgeon: Eustace Moore, MD;  Location: [facility];  Service: Neurosurgery;  Laterality: N/A;  ANTERIOR CERVICAL DECOMPRESSION/DISCECTOMY FUSION CERVICAL FOUR- CERVICAL FIVE, CERVICAL FIVE- CERVICAL SIX, CERVICAL SIX- CERVICAL SEVEN  . BACK SURGERY    . BACK SURGERY     2 plates, 8 screws in back  . carpell tunnell     rt wrist  x2  . CATARACT EXTRACTION W/PHACO Left 06/14/2016   Procedure: CATARACT EXTRACTION PHACO AND INTRAOCULAR LENS PLACEMENT; CDE:  3.92;  Surgeon: Williams Che, MD;  Location: [facility];  Service: Ophthalmology;  Laterality: Left;  . CHOLECYSTECTOMY    . COLONOSCOPY  5/02   pancolonic deverticula, internal hemorrhoids  . COLONOSCOPY  1/11   single external hemorrhoidal tag and anal papilla otherwise normal rectum, pancolonic diverticula, s/p sigmoid biopsy and stool sampling all unremarkable  . ESOPHAGOGASTRODUODENOSCOPY  04/06/2010   intact Nissen fundoplication S/P dilation, somewhat baggy atonic appearing esophagus, 58-F dilation  . ESOPHAGOGASTRODUODENOSCOPY  1/11   nomral esophagus s/p 54-F Maloney dilation, normal/intact Nissen fundoplication, diffuse patchy erythema and erosions likely NSAID/ASA effect with benign biopsy  . EYE SURGERY     right eye cataract  . kidney surgery for cancer    . MAXIMUM ACCESS (MAS)POSTERIOR LUMBAR INTERBODY FUSION (PLIF) 1 LEVEL N/A 07/25/2014   Procedure: FOR MAXIMUM ACCESS (MAS) POSTERIOR LUMBAR INTERBODY FUSION (PLIF) Lumbar two/three, Removal of hardware Lumbar three/four;  Surgeon: Eustace Moore, MD;  Location: [facility];  Service: Neurosurgery;  Laterality: N/A;  . MAXIMUM ACCESS (MAS)POSTERIOR LUMBAR INTERBODY FUSION  (PLIF) 2 LEVEL N/A 09/11/2015   Procedure: LUMBAR FOUR-FIVE LUMBAR FIVE SACRAL ONE  MAXIMUM ACCESS SURGERY, POSTERIOR LUMBAR INTERBODY FUSION , Removal of LUMBAR TWO TO LUMBAR FOUR  HARDWARE;  Surgeon: Eustace Moore, MD;  Location: [facility];  Service: Neurosurgery;  Laterality: N/A;  . NISSEN FUNDOPLICATION    . REVERSE SHOULDER ARTHROPLASTY Right 05/28/2016   Procedure: RIGHT REVERSE SHOULDER ARTHROPLASTY;  Surgeon: Netta Cedars, MD;  Location: [facility];  Service: Orthopedics;  Laterality: Right;  . ROBOT ASSISTED LAPAROSCOPIC NEPHRECTOMY  08/23/2011   Procedure: ROBOTIC ASSISTED LAPAROSCOPIC NEPHRECTOMY;  Surgeon: Dutch Gray, MD;  Location: [facility];  Service: Urology;  Laterality: Left;  Left Robotic Assisted  Laparoscopic Partial Nephrectomy   . ROTATOR CUFF REPAIR     right side X 2  . TONSILLECTOMY    . TOTAL SHOULDER REVISION Right 08/19/2017   Procedure: RIGHT SHOULDER POLYETHYLENE EXCHANGE;  Surgeon: Netta Cedars, MD;  Location:  [facility];  Service: Orthopedics;  Laterality: Right;    Family Psychiatric History: See below  Family History:  Family History  Problem Relation Age of Onset  . Bipolar disorder Daughter   . Colon cancer Neg Hx     Social History:  Social History   Socioeconomic History  . Marital status: Divorced    Spouse name: Not on file  . Number of children: Not on file  . Years of education: Not on file  . Highest education level: Not on file  Occupational History  . Not on file  Social Needs  . Financial resource strain: Not on file  . Food insecurity:    Worry: Not on file    Inability: Not on file  . Transportation needs:    Medical: Not on file    Non-medical: Not on file  Tobacco Use  . Smoking status: Never Smoker  . Smokeless tobacco: Never Used  Substance and Sexual Activity  . Alcohol use: No  . Drug use: No  . Sexual activity: Not Currently  Lifestyle  . Physical activity:    Days per week: Not on file    Minutes per session: Not on file   . Stress: Not on file  Relationships  . Social connections:    Talks on phone: Not on file    Gets together: Not on file    Attends religious service: Not on file    Active member of club or organization: Not on file    Attends meetings of clubs or organizations: Not on file    Relationship status: Not on file  Other Topics Concern  . Not on file  Social History Narrative  . Not on file    Allergies:  Allergies  Allergen Reactions  . Shellfish Allergy Anaphylaxis  . Neurontin [Gabapentin] Other (See Comments)    Unable to talk when takes medication  . Chlorhexidine Gluconate Rash and Other (See Comments)    burning  . Codeine Itching     itching  . Haldol [Haloperidol Lactate] Other (See Comments)    Muscle spasms  . Hydromorphone Hcl Nausea And Vomiting  . Latex Rash    adhesives  . Propoxyphene N-Acetaminophen Nausea And Vomiting  . Vicodin [Hydrocodone-Acetaminophen] Itching    Metabolic Disorder Labs: No results found for: HGBA1C, MPG No results found for: PROLACTIN No results found for: CHOL, TRIG, HDL, CHOLHDL, VLDL, LDLCALC Lab Results  Component Value Date   TSH 2.25 01/28/2016    Therapeutic Level Labs: No results found for: LITHIUM No results found for: VALPROATE No components found for:  CBMZ  Current Medications: Current Outpatient Medications  Medication Sig Dispense Refill  . ALPRAZolam (XANAX) 1 MG tablet Take 1 tablet (1 mg total) by mouth 4 (four) times daily. 120 tablet 2  . atorvastatin (LIPITOR) 20 MG tablet TAKE 1 TABLET (20mg ) BY MOUTH EVERYDAY AT BEDTIME  3  . cholestyramine (QUESTRAN) 4 GM/DOSE powder TAKE 1 SCOOPFUL (4 G TOTAL) BY MOUTH DAILY. DO NOT TAKE WITHIN 2 HOURS OF OTHER MEDICATIONS 378 g 5  . FLUoxetine (PROZAC) 40 MG capsule Take 1 capsule (40 mg total) by mouth 2 (two) times daily. 180 capsule 2  . meloxicam (MOBIC) 7.5 MG tablet Take 7.5 mg by mouth 2 (two) times daily.    . methocarbamol (ROBAXIN) 500 MG tablet Take 1  tablet (500 mg total) by mouth every 6 (six) hours as needed for muscle spasms. 30 tablet 0  . Multiple Vitamin (MULTIVITAMIN WITH MINERALS)  TABS Take 2 tablets by mouth every evening.     . Multiple Vitamins-Minerals (HAIR/SKIN/NAILS) CAPS Take 2 tablets by mouth daily.    . naproxen sodium (ALEVE) 220 MG tablet Take 440 mg by mouth 2 (two) times daily as needed (pain).    . nitroGLYCERIN (NITROSTAT) 0.4 MG SL tablet Place 0.4 mg under the tongue every 5 (five) minutes as needed for chest pain.    Marland Kitchen OLANZapine (ZYPREXA) 20 MG tablet Take 1 tablet (20 mg total) by mouth at bedtime. 90 tablet 2  . oxybutynin (DITROPAN XL) 15 MG 24 hr tablet Take 15 mg by mouth daily.  11  . rOPINIRole (REQUIP) 3 MG tablet TAKE 1 TABLET (3mg ) BY MOUTH 3 TIMES DAILY.  11  . traZODone (DESYREL) 100 MG tablet Take 1 tablet (100 mg total) by mouth at bedtime. 90 tablet 2  . Vitamin D, Ergocalciferol, (DRISDOL) 50000 units CAPS capsule Take 50,000 Units by mouth every 7 (seven) days. Fridays     No current facility-administered medications for this visit.      Musculoskeletal: Strength & Muscle Tone: within normal limits Gait & Station: normal Patient leans: N/A  Psychiatric Specialty Exam: Review of Systems  Psychiatric/Behavioral: Positive for memory loss.  All other systems reviewed and are negative.   Blood pressure 126/80, pulse 85, height 5\' 5"  (1.651 m), weight 186 lb (84.4 kg), SpO2 95 %.Body mass index is 30.95 kg/m.  General Appearance: Casual and Fairly Groomed  Eye Contact:  Good  Speech:  Clear and Coherent  Volume:  Normal  Mood:  Dysphoric  Affect:  Flat  Thought Process:  Goal Directed  Orientation:  Full (Time, Place, and Person)  Thought Content: Rumination   Suicidal Thoughts:  No  Homicidal Thoughts:  No  Memory:  Immediate;   Fair Recent;   Fair Remote;   Good  Judgement:  Fair  Insight:  Fair  Psychomotor Activity:  Decreased  Concentration:  Concentration: Good and  Attention Span: Good  Recall:  [place] of Knowledge: Good  Language: Good  Akathisia:  No  Handed:  Right  AIMS (if indicated): not done  Assets:  Communication Skills Desire for Improvement Resilience Social Support Talents/Skills  ADL's:  Intact  Cognition: WNL  Sleep:  Good   Screenings:   Assessment and Plan: She is a 73 year old female with a history of bipolar disorder.  She states her mood has been stable but she notes decline in memory since her mother died 2 years ago.  I suggested that she get a referral to neurology clinic for dementia work-up.  For now she will continue Xanax 1 mg 4 times daily for anxiety, Prozac 40 mg twice daily for depression, olanzapine 20 mg at bedtime for mood stabilization and trazodone 100 mg at bedtime for sleep.  She will return to see me in 3 months   Levonne Spiller, MD 10/12/2018, 2:37 PM

## 2018-10-31 ENCOUNTER — Ambulatory Visit (INDEPENDENT_AMBULATORY_CARE_PROVIDER_SITE_OTHER): Payer: Medicare Other | Admitting: Urology

## 2018-10-31 DIAGNOSIS — N3281 Overactive bladder: Secondary | ICD-10-CM | POA: Diagnosis not present

## 2018-10-31 DIAGNOSIS — N3941 Urge incontinence: Secondary | ICD-10-CM

## 2018-10-31 DIAGNOSIS — N3944 Nocturnal enuresis: Secondary | ICD-10-CM | POA: Diagnosis not present

## 2018-12-05 ENCOUNTER — Other Ambulatory Visit (HOSPITAL_COMMUNITY): Payer: Self-pay | Admitting: Psychiatry

## 2018-12-21 ENCOUNTER — Other Ambulatory Visit: Payer: Self-pay | Admitting: Gastroenterology

## 2018-12-26 ENCOUNTER — Telehealth (HOSPITAL_COMMUNITY): Payer: Self-pay | Admitting: *Deleted

## 2018-12-26 NOTE — Telephone Encounter (Signed)
Dr Harrington [facility] called for refill on Alprazolam 1 mg  For next order scheduled for 4-25-200 thru  02-11-2019

## 2018-12-27 ENCOUNTER — Telehealth: Payer: Self-pay

## 2018-12-27 ENCOUNTER — Other Ambulatory Visit (HOSPITAL_COMMUNITY): Payer: Self-pay | Admitting: Psychiatry

## 2018-12-27 MED ORDER — ALPRAZOLAM 1 MG PO TABS: 1.0000 mg | ORAL_TABLET | Freq: Four times a day (QID) | ORAL | 2 refills | Status: DC

## 2018-12-27 NOTE — Telephone Encounter (Signed)
Pt requested Cholestyramine Powder [phone] GM from [facility]. Faxed request back with the approval of [phone] GM with 2 rf. RX was sent into pt's pharmacy on 12/22/2018.

## 2018-12-27 NOTE — Telephone Encounter (Signed)
done

## 2019-01-11 ENCOUNTER — Ambulatory Visit (HOSPITAL_COMMUNITY): Payer: Medicare Other | Admitting: Psychiatry

## 2019-02-13 DIAGNOSIS — Z96611 Presence of right artificial shoulder joint: Secondary | ICD-10-CM | POA: Diagnosis not present

## 2019-02-13 DIAGNOSIS — Z471 Aftercare following joint replacement surgery: Secondary | ICD-10-CM | POA: Diagnosis not present

## 2019-02-13 DIAGNOSIS — M7542 Impingement syndrome of left shoulder: Secondary | ICD-10-CM | POA: Diagnosis not present

## 2019-02-13 DIAGNOSIS — M25512 Pain in left shoulder: Secondary | ICD-10-CM | POA: Diagnosis not present

## 2019-02-26 ENCOUNTER — Other Ambulatory Visit: Payer: Self-pay

## 2019-02-26 ENCOUNTER — Encounter (HOSPITAL_COMMUNITY): Payer: Self-pay | Admitting: Psychiatry

## 2019-02-26 ENCOUNTER — Ambulatory Visit (INDEPENDENT_AMBULATORY_CARE_PROVIDER_SITE_OTHER): Payer: Medicare Other | Admitting: Psychiatry

## 2019-02-26 DIAGNOSIS — F3130 Bipolar disorder, current episode depressed, mild or moderate severity, unspecified: Secondary | ICD-10-CM

## 2019-02-26 MED ORDER — FLUOXETINE HCL 40 MG PO CAPS: 40.0000 mg | ORAL_CAPSULE | Freq: Two times a day (BID) | ORAL | 2 refills | Status: DC

## 2019-02-26 MED ORDER — TRAZODONE HCL 100 MG PO TABS: 100.0000 mg | ORAL_TABLET | Freq: Every day | ORAL | 2 refills | Status: DC

## 2019-02-26 MED ORDER — OLANZAPINE 20 MG PO TABS: 20.0000 mg | ORAL_TABLET | Freq: Every day | ORAL | 2 refills | Status: DC

## 2019-02-26 MED ORDER — ALPRAZOLAM 1 MG PO TABS
1.0000 mg | ORAL_TABLET | Freq: Four times a day (QID) | ORAL | 2 refills | Status: DC
Start: 2019-02-26 — End: 2019-05-29

## 2019-02-26 NOTE — Progress Notes (Signed)
Virtual Visit via Telephone Note  I connected with Victoria Mccarthy on 02/26/19 at  2:40 PM EDT by telephone and verified that I am speaking with the correct person using two identifiers.   I discussed the limitations, risks, security and privacy concerns of performing an evaluation and management service by telephone and the availability of in person appointments. I also discussed with the patient that there may be a patient responsible charge related to this service. The patient expressed understanding and agreed to proceed.      I discussed the assessment and treatment plan with the patient. The patient was provided an opportunity to ask questions and all were answered. The patient agreed with the plan and demonstrated an understanding of the instructions.   The patient was advised to call back or seek an in-person evaluation if the symptoms worsen or if the condition fails to improve as anticipated.  I provided 15 minutes of non-face-to-face time during this encounter.   Victoria Spiller, MD  [facility] MD/PA/NP OP Progress Note  02/26/2019 3:24 PM Victoria Mccarthy  MRN:  [id number]  Chief Complaint:  Chief Complaint    Depression; Anxiety; Manic Behavior; Follow-up     HPI: this patient is a 73 year old divorced white female who lives alone in [place]. She has one son, one daughter, 6 grandchildren 3 great-grandchildren. 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  The patient states that she's had problems with mental illness since her 89s. She was hospitalized at [facility] in [facility] twice in the 1980s. During these times she was depressed extremely anxious and having paranoid delusions that she had killed someone and that the police were after her. At one point she was hearing voices telling her to hurt her self. She finally started seeing Dr. Charmayne Sheer private practitioner in [place] who treated her for years. In the past she's been on Prozac, Symbyax, Xanax,  Valium, BuSpar and lithium.  After her psychiatrist retired, she began going to the [facility] but got tired of waiting for hours for recheck visit. She most recently she's been going to [facility]. The psychiatrist there has cut down her medication. She used to be on Symbiax 6/50-2 a day and she was cut down to 4-25, once a day. Since this happened she become extremely anxious and depressed. At the beginning of this year however her insurance would not pay for the medicine so she's been off it entirely and she is getting even worse. The physician also cut her Xanax down from 1 mg 4 times a day to 0.5 mg twice a day and she is exceedingly anxious.  Currently the patient states that she is depressed all the time, very anxious and tearful, irritable and angry. She can't stand to be around people and stays by herself with her dogs. She's trying to spend some time sewing but it's very hard for her to concentrate. Her sleep is poor and her mind races. She is again having the delusion that she may have killed someone at the police may be coming to get her. She has nightmares about this. It's hard for her to sit still. She does not have any direct thoughts of hurting self or others. She constantly picks at her fingernails. She denies any current auditory or visual hallucinations  The patient returns for follow-up after 5 months.  She is assessed via phone due to the coronavirus pandemic.  She states that overall she is doing well.  She still living in her mother's old house and is slowly starting to clean things out.  Her ex-husband has been a good friend to her and has been helping her.  She wants to get back into her sewing but her sewing machine is broken and she is going to get it repaired.  She thinks her energy and interest are starting to come back.  She denies any manic symptoms or auditory visual destinations.  She feels tired at times but does not think it is from the medication.  She wants  to continue all of her medicine because she feels her mood is very stable right now.  She denies any paranoid delusions or thoughts of harm to self or others. Visit Diagnosis:    ICD-10-CM   1. Bipolar I disorder, most recent episode depressed (Victoria Mccarthy) F31.30     Past Psychiatric History: Hospitalized twice in the 1980s, since then outpatient treatment for bipolar disorder  Past Medical History:  Past Medical History:  Diagnosis Date  . Anemia    hx  . Anxiety   . Arthritis    chronic back pain  . Bipolar disorder (Victoria Mccarthy)   . Cancer [facility]) Jan 2013   kidney cancer, left, s/p partial nephrectomy  . Depression   . Diarrhea    incontinent stools  . Diverticulitis    treated at least 5 times in past, hospitalized twice  . Heart murmur   . History of hiatal hernia    s/p  . MVP (mitral valve prolapse)   . Neuropathy   . Nocturia   . Osteoarthritis   . Osteoporosis   . PONV (postoperative nausea and vomiting)   . Psychotic affective disorder ([place])    "psychotic delusions" "I cut myself"   . Restless leg syndrome   . Rheumatic fever   . Urinary frequency     Past Surgical History:  Procedure Laterality Date  . ABDOMINAL HYSTERECTOMY    . ANTERIOR CERVICAL DECOMP/DISCECTOMY FUSION N/A 09/28/2017   Procedure: ANTERIOR CERVICAL DECOMPRESSION/DISCECTOMY FUSION CERVICAL FOUR- CERVICAL FIVE, CERVICAL FIVE- CERVICAL SIX, CERVICAL SIX- CERVICAL SEVEN;  Surgeon: Eustace Moore, MD;  Location: [place];  Service: Neurosurgery;  Laterality: N/A;  ANTERIOR CERVICAL DECOMPRESSION/DISCECTOMY FUSION CERVICAL FOUR- CERVICAL FIVE, CERVICAL FIVE- CERVICAL SIX, CERVICAL SIX- CERVICAL SEVEN  . BACK SURGERY    . BACK SURGERY     2 plates, 8 screws in back  . carpell tunnell     rt wrist  x2  . CATARACT EXTRACTION W/PHACO Left 06/14/2016   Procedure: CATARACT EXTRACTION PHACO AND INTRAOCULAR LENS PLACEMENT; CDE:  3.92;  Surgeon: Williams Che, MD;  Location: [facility];  Service: Ophthalmology;  Laterality:  Left;  . CHOLECYSTECTOMY    . COLONOSCOPY  5/02   pancolonic deverticula, internal hemorrhoids  . COLONOSCOPY  1/11   single external hemorrhoidal tag and anal papilla otherwise normal rectum, pancolonic diverticula, s/p sigmoid biopsy and stool sampling all unremarkable  . ESOPHAGOGASTRODUODENOSCOPY  04/06/2010   intact Nissen fundoplication S/P dilation, somewhat baggy atonic appearing esophagus, 58-F dilation  . ESOPHAGOGASTRODUODENOSCOPY  1/11   nomral esophagus s/p 54-F Maloney dilation, normal/intact Nissen fundoplication, diffuse patchy erythema and erosions likely NSAID/ASA effect with benign biopsy  . EYE SURGERY     right eye cataract  . kidney surgery for cancer    . MAXIMUM ACCESS (MAS)POSTERIOR LUMBAR INTERBODY FUSION (PLIF) 1 LEVEL N/A 07/25/2014   Procedure: FOR MAXIMUM ACCESS (MAS) POSTERIOR LUMBAR INTERBODY FUSION (PLIF) Lumbar two/three, Removal of hardware  Lumbar three/four;  Surgeon: Eustace Moore, MD;  Location: [facility];  Service: Neurosurgery;  Laterality: N/A;  . MAXIMUM ACCESS (MAS)POSTERIOR LUMBAR INTERBODY FUSION (PLIF) 2 LEVEL N/A 09/11/2015   Procedure: LUMBAR FOUR-FIVE LUMBAR FIVE SACRAL ONE  MAXIMUM ACCESS SURGERY, POSTERIOR LUMBAR INTERBODY FUSION , Removal of LUMBAR TWO TO LUMBAR FOUR  HARDWARE;  Surgeon: Eustace Moore, MD;  Location: [place] [facility];  Service: Neurosurgery;  Laterality: N/A;  . NISSEN FUNDOPLICATION    . REVERSE SHOULDER ARTHROPLASTY Right 05/28/2016   Procedure: RIGHT REVERSE SHOULDER ARTHROPLASTY;  Surgeon: Netta Cedars, MD;  Location: [place];  Service: Orthopedics;  Laterality: Right;  . ROBOT ASSISTED LAPAROSCOPIC NEPHRECTOMY  08/23/2011   Procedure: ROBOTIC ASSISTED LAPAROSCOPIC NEPHRECTOMY;  Surgeon: Dutch Gray, MD;  Location: [facility];  Service: Urology;  Laterality: Left;  Left Robotic Assisted  Laparoscopic Partial Nephrectomy   . ROTATOR CUFF REPAIR     right side X 2  . TONSILLECTOMY    . TOTAL SHOULDER REVISION Right 08/19/2017    Procedure: RIGHT SHOULDER POLYETHYLENE EXCHANGE;  Surgeon: Netta Cedars, MD;  Location: [place];  Service: Orthopedics;  Laterality: Right;    Family Psychiatric History: See below  Family History:  Family History  Problem Relation Age of Onset  . Bipolar disorder Daughter   . Colon cancer Neg Hx     Social History:  Social History   Socioeconomic History  . Marital status: Divorced    Spouse name: Not on file  . Number of children: Not on file  . Years of education: Not on file  . Highest education level: Not on file  Occupational History  . Not on file  Social Needs  . Financial resource strain: Not on file  . Food insecurity:    Worry: Not on file    Inability: Not on file  . Transportation needs:    Medical: Not on file    Non-medical: Not on file  Tobacco Use  . Smoking status: Never Smoker  . Smokeless tobacco: Never Used  Substance and Sexual Activity  . Alcohol use: No  . Drug use: No  . Sexual activity: Not Currently  Lifestyle  . Physical activity:    Days per week: Not on file    Minutes per session: Not on file  . Stress: Not on file  Relationships  . Social connections:    Talks on phone: Not on file    Gets together: Not on file    Attends religious service: Not on file    Active member of club or organization: Not on file    Attends meetings of clubs or organizations: Not on file    Relationship status: Not on file  Other Topics Concern  . Not on file  Social History Narrative  . Not on file    Allergies:  Allergies  Allergen Reactions  . Shellfish Allergy Anaphylaxis  . Neurontin [Gabapentin] Other (See Comments)    Unable to talk when takes medication  . Chlorhexidine Gluconate Rash and Other (See Comments)    burning  . Codeine Itching     itching  . Haldol [Haloperidol Lactate] Other (See Comments)    Muscle spasms  . Hydromorphone Hcl Nausea And Vomiting  . Latex Rash    adhesives  . Propoxyphene N-Acetaminophen Nausea And  Vomiting  . Vicodin [Hydrocodone-Acetaminophen] Itching    Metabolic Disorder Labs: No results found for: HGBA1C, MPG No results found for: PROLACTIN No results found for: CHOL, TRIG, HDL,  CHOLHDL, VLDL, LDLCALC Lab Results  Component Value Date   TSH 2.25 01/28/2016    Therapeutic Level Labs: No results found for: LITHIUM No results found for: VALPROATE No components found for:  CBMZ  Current Medications: Current Outpatient Medications  Medication Sig Dispense Refill  . ALPRAZolam (XANAX) 1 MG tablet Take 1 tablet (1 mg total) by mouth 4 (four) times daily. 120 tablet 2  . atorvastatin (LIPITOR) 20 MG tablet TAKE 1 TABLET (20mg ) BY MOUTH EVERYDAY AT BEDTIME  3  . cholestyramine (QUESTRAN) 4 GM/DOSE powder TAKE 1 SCOOPFUL OF 4 GRAMS BY MOUTH DAILY DO NOT TAKE WITHIN 2 HRS OF OTHER MEDS 378 g 2  . FLUoxetine (PROZAC) 40 MG capsule Take 1 capsule (40 mg total) by mouth 2 (two) times daily. 180 capsule 2  . meloxicam (MOBIC) 7.5 MG tablet Take 7.5 mg by mouth 2 (two) times daily.    . methocarbamol (ROBAXIN) 500 MG tablet Take 1 tablet (500 mg total) by mouth every 6 (six) hours as needed for muscle spasms. 30 tablet 0  . Multiple Vitamin (MULTIVITAMIN WITH MINERALS) TABS Take 2 tablets by mouth every evening.     . Multiple Vitamins-Minerals (HAIR/SKIN/NAILS) CAPS Take 2 tablets by mouth daily.    . naproxen sodium (ALEVE) 220 MG tablet Take 440 mg by mouth 2 (two) times daily as needed (pain).    . nitroGLYCERIN (NITROSTAT) 0.4 MG SL tablet Place 0.4 mg under the tongue every 5 (five) minutes as needed for chest pain.    Marland Kitchen OLANZapine (ZYPREXA) 20 MG tablet Take 1 tablet (20 mg total) by mouth at bedtime. 90 tablet 2  . oxybutynin (DITROPAN XL) 15 MG 24 hr tablet Take 15 mg by mouth daily.  11  . rOPINIRole (REQUIP) 3 MG tablet TAKE 1 TABLET (3mg ) BY MOUTH 3 TIMES DAILY.  11  . traZODone (DESYREL) 100 MG tablet Take 1 tablet (100 mg total) by mouth at bedtime. 90 tablet 2  . Vitamin  D, Ergocalciferol, (DRISDOL) 50000 units CAPS capsule Take 50,000 Units by mouth every 7 (seven) days. Fridays     No current facility-administered medications for this visit.      Musculoskeletal: Strength & Muscle Tone: within normal limits Gait & Station: normal Patient leans: N/A  Psychiatric Specialty Exam: Review of Systems  All other systems reviewed and are negative.   There were no vitals taken for this visit.There is no height or weight on file to calculate BMI.  General Appearance: NA  Eye Contact:  NA  Speech:  Clear and Coherent  Volume:  Normal  Mood:  Euthymic  Affect:  NA  Thought Process:  Goal Directed  Orientation:  Full (Time, Place, and Person)  Thought Content: WDL   Suicidal Thoughts:  No  Homicidal Thoughts:  No  Memory:  Immediate;   Good Recent;   Good Remote;   Good  Judgement:  Good  Insight:  Fair  Psychomotor Activity:  Normal  Concentration:  Concentration: Good and Attention Span: Good  Recall:  Good  Fund of Knowledge: Good  Language: Good  Akathisia:  No  Handed:  Right  AIMS (if indicated): not done  Assets:  Communication Skills Desire for Improvement Physical Health Resilience Social Support Talents/Skills  ADL's:  Intact  Cognition: WNL  Sleep:  Good   Screenings:   Assessment and Plan: This patient is a 73 year old female with a history of bipolar disorder.  She is doing well on her current regimen.  She will  continue Xanax 1 mg 4 times daily for anxiety, Prozac 40 mg twice daily for depression, olanzapine 20 mg at bedtime for mood stabilization and trazodone 100 mg at bedtime for sleep.  She will return to see me in 3 months   Victoria Spiller, MD 02/26/2019, 3:24 PM

## 2019-03-29 DIAGNOSIS — M25512 Pain in left shoulder: Secondary | ICD-10-CM | POA: Diagnosis not present

## 2019-04-12 DIAGNOSIS — N390 Urinary tract infection, site not specified: Secondary | ICD-10-CM | POA: Diagnosis not present

## 2019-04-12 DIAGNOSIS — N3946 Mixed incontinence: Secondary | ICD-10-CM | POA: Diagnosis not present

## 2019-04-12 DIAGNOSIS — N3944 Nocturnal enuresis: Secondary | ICD-10-CM | POA: Diagnosis not present

## 2019-04-21 ENCOUNTER — Other Ambulatory Visit: Payer: Self-pay | Admitting: Nurse Practitioner

## 2019-04-26 DIAGNOSIS — N3946 Mixed incontinence: Secondary | ICD-10-CM | POA: Diagnosis not present

## 2019-04-26 DIAGNOSIS — N3941 Urge incontinence: Secondary | ICD-10-CM | POA: Diagnosis not present

## 2019-04-26 DIAGNOSIS — N3281 Overactive bladder: Secondary | ICD-10-CM | POA: Diagnosis not present

## 2019-04-26 DIAGNOSIS — N3944 Nocturnal enuresis: Secondary | ICD-10-CM | POA: Diagnosis not present

## 2019-04-26 NOTE — Telephone Encounter (Signed)
Please tell the patient I can put in limited refills, but we haven't seen her for about 3 years. She'll need an OV follow-up for further refills.

## 2019-04-30 ENCOUNTER — Other Ambulatory Visit (HOSPITAL_COMMUNITY): Payer: Self-pay | Admitting: Family Medicine

## 2019-04-30 DIAGNOSIS — Z780 Asymptomatic menopausal state: Secondary | ICD-10-CM

## 2019-04-30 DIAGNOSIS — E2839 Other primary ovarian failure: Secondary | ICD-10-CM

## 2019-04-30 DIAGNOSIS — Z1231 Encounter for screening mammogram for malignant neoplasm of breast: Secondary | ICD-10-CM

## 2019-04-30 NOTE — Telephone Encounter (Signed)
Letter mailed to the pt. 

## 2019-05-07 DIAGNOSIS — Z6832 Body mass index (BMI) 32.0-32.9, adult: Secondary | ICD-10-CM | POA: Diagnosis not present

## 2019-05-07 DIAGNOSIS — R5383 Other fatigue: Secondary | ICD-10-CM | POA: Diagnosis not present

## 2019-05-07 DIAGNOSIS — R252 Cramp and spasm: Secondary | ICD-10-CM | POA: Diagnosis not present

## 2019-05-07 DIAGNOSIS — I209 Angina pectoris, unspecified: Secondary | ICD-10-CM | POA: Diagnosis not present

## 2019-05-07 DIAGNOSIS — E6609 Other obesity due to excess calories: Secondary | ICD-10-CM | POA: Diagnosis not present

## 2019-05-09 ENCOUNTER — Other Ambulatory Visit: Payer: Self-pay

## 2019-05-09 ENCOUNTER — Encounter (HOSPITAL_COMMUNITY): Payer: Self-pay

## 2019-05-09 ENCOUNTER — Ambulatory Visit (HOSPITAL_COMMUNITY)
Admission: RE | Admit: 2019-05-09 | Discharge: 2019-05-09 | Disposition: A | Discharge status: Home / Self Care | Payer: Medicare Other | Source: Ambulatory Visit | Attending: Family Medicine | Admitting: Family Medicine

## 2019-05-09 DIAGNOSIS — Z780 Asymptomatic menopausal state: Secondary | ICD-10-CM | POA: Insufficient documentation

## 2019-05-09 DIAGNOSIS — Z1231 Encounter for screening mammogram for malignant neoplasm of breast: Secondary | ICD-10-CM | POA: Insufficient documentation

## 2019-05-09 DIAGNOSIS — E2839 Other primary ovarian failure: Secondary | ICD-10-CM

## 2019-05-09 DIAGNOSIS — M8589 Other specified disorders of bone density and structure, multiple sites: Secondary | ICD-10-CM | POA: Diagnosis not present

## 2019-05-18 DIAGNOSIS — N390 Urinary tract infection, site not specified: Secondary | ICD-10-CM | POA: Diagnosis not present

## 2019-05-18 DIAGNOSIS — N3946 Mixed incontinence: Secondary | ICD-10-CM | POA: Diagnosis not present

## 2019-05-29 ENCOUNTER — Other Ambulatory Visit: Payer: Self-pay

## 2019-05-29 ENCOUNTER — Encounter (HOSPITAL_COMMUNITY): Payer: Self-pay | Admitting: Psychiatry

## 2019-05-29 ENCOUNTER — Ambulatory Visit (INDEPENDENT_AMBULATORY_CARE_PROVIDER_SITE_OTHER): Payer: Medicare Other | Admitting: Psychiatry

## 2019-05-29 DIAGNOSIS — F3130 Bipolar disorder, current episode depressed, mild or moderate severity, unspecified: Secondary | ICD-10-CM

## 2019-05-29 MED ORDER — ALPRAZOLAM 1 MG PO TABS: 1.0000 mg | ORAL_TABLET | Freq: Four times a day (QID) | ORAL | 2 refills | Status: DC

## 2019-05-29 MED ORDER — TRAZODONE HCL 100 MG PO TABS: 100.0000 mg | ORAL_TABLET | Freq: Every day | ORAL | 2 refills | Status: DC

## 2019-05-29 MED ORDER — FLUOXETINE HCL 40 MG PO CAPS: 40.0000 mg | ORAL_CAPSULE | Freq: Two times a day (BID) | ORAL | 2 refills | Status: DC

## 2019-05-29 MED ORDER — OLANZAPINE 20 MG PO TABS: 20.0000 mg | ORAL_TABLET | Freq: Every day | ORAL | 2 refills | Status: DC

## 2019-05-29 NOTE — Progress Notes (Signed)
Virtual Visit via Telephone Note  I connected with Victoria Mccarthy on 05/29/19 at  2:00 PM EDT by telephone and verified that I am speaking with the correct person using two identifiers.   I discussed the limitations, risks, security and privacy concerns of performing an evaluation and management service by telephone and the availability of in person appointments. I also discussed with the patient that there may be a patient responsible charge related to this service. The patient expressed understanding and agreed to proceed.      I discussed the assessment and treatment plan with the patient. The patient was provided an opportunity to ask questions and all were answered. The patient agreed with the plan and demonstrated an understanding of the instructions.   The patient was advised to call back or seek an in-person evaluation if the symptoms worsen or if the condition fails to improve as anticipated.  I provided 15 minutes of non-face-to-face time during this encounter.   Levonne Spiller, MD  [facility] MD/PA/NP OP Progress Note  05/29/2019 2:17 PM Victoria Mccarthy  MRN:  [id number]  Chief Complaint:  Chief Complaint    Depression; Anxiety; Follow-up     HPI: This patient is a 73 year old divorced white female who lives alone in [place].  She has 1 son and 1 daughter 6 grandchildren and 3 great-grandchildren.  She is a retired Ambulance person.  The patient returns for follow-up regarding bipolar disorder and anxiety.  She was last seen 3 months ago.  She states that overall she is doing well.  She met a man through a mutual friend and a been getting out together and going for rides in the [facility] etc.  This is helped her mood considerably to have someone to be with and talk to.  She states that her mood is been stable, neither depressed nor manic.  She is sleeping well at night and her anxiety is under good control.  She denies significant anxiety or panic attacks.  She denies any  auditory or visual destinations delusions or thoughts of self-harm or suicide Visit Diagnosis:    ICD-10-CM   1. Bipolar I disorder, most recent episode depressed ([place])  F31.30     Past Psychiatric History: Hospitalized twice in the 1980s, since then outpatient treatment for bipolar disorder  Past Medical History:  Past Medical History:  Diagnosis Date  . Anemia    hx  . Anxiety   . Arthritis    chronic back pain  . Bipolar disorder ([place])   . Cancer [facility]) Jan 2013   kidney cancer, left, s/p partial nephrectomy  . Depression   . Diarrhea    incontinent stools  . Diverticulitis    treated at least 5 times in past, hospitalized twice  . Heart murmur   . History of hiatal hernia    s/p  . MVP (mitral valve prolapse)   . Neuropathy   . Nocturia   . Osteoarthritis   . Osteoporosis   . PONV (postoperative nausea and vomiting)   . Psychotic affective disorder ([place])    "psychotic delusions" "I cut myself"   . Restless leg syndrome   . Rheumatic fever   . Urinary frequency     Past Surgical History:  Procedure Laterality Date  . ABDOMINAL HYSTERECTOMY    . ANTERIOR CERVICAL DECOMP/DISCECTOMY FUSION N/A 09/28/2017   Procedure: ANTERIOR CERVICAL DECOMPRESSION/DISCECTOMY FUSION CERVICAL FOUR- CERVICAL FIVE, CERVICAL FIVE- CERVICAL SIX, CERVICAL SIX- CERVICAL SEVEN;  Surgeon: Eustace Moore, MD;  Location: [place] [facility];  Service: Neurosurgery;  Laterality: N/A;  ANTERIOR CERVICAL DECOMPRESSION/DISCECTOMY FUSION CERVICAL FOUR- CERVICAL FIVE, CERVICAL FIVE- CERVICAL SIX, CERVICAL SIX- CERVICAL SEVEN  . BACK SURGERY    . BACK SURGERY     2 plates, 8 screws in back  . carpell tunnell     rt wrist  x2  . CATARACT EXTRACTION W/PHACO Left 06/14/2016   Procedure: CATARACT EXTRACTION PHACO AND INTRAOCULAR LENS PLACEMENT; CDE:  3.92;  Surgeon: Williams Che, MD;  [facility];  Service: Ophthalmology;  Laterality: Left;  . CHOLECYSTECTOMY    . COLONOSCOPY  5/02   pancolonic deverticula,  internal hemorrhoids  . COLONOSCOPY  1/11   single external hemorrhoidal tag and anal papilla otherwise normal rectum, pancolonic diverticula, s/p sigmoid biopsy and stool sampling all unremarkable  . ESOPHAGOGASTRODUODENOSCOPY  04/06/2010   intact Nissen fundoplication S/P dilation, somewhat baggy atonic appearing esophagus, 58-F dilation  . ESOPHAGOGASTRODUODENOSCOPY  1/11   nomral esophagus s/p 54-F Maloney dilation, normal/intact Nissen fundoplication, diffuse patchy erythema and erosions likely NSAID/ASA effect with benign biopsy  . EYE SURGERY     right eye cataract  . kidney surgery for cancer    . MAXIMUM ACCESS (MAS)POSTERIOR LUMBAR INTERBODY FUSION (PLIF) 1 LEVEL N/A 07/25/2014   Procedure: FOR MAXIMUM ACCESS (MAS) POSTERIOR LUMBAR INTERBODY FUSION (PLIF) Lumbar two/three, Removal of hardware Lumbar three/four;  Surgeon: Eustace Moore, MD;  Location: [place] [facility];  Service: Neurosurgery;  Laterality: N/A;  . MAXIMUM ACCESS (MAS)POSTERIOR LUMBAR INTERBODY FUSION (PLIF) 2 LEVEL N/A 09/11/2015   Procedure: LUMBAR FOUR-FIVE LUMBAR FIVE SACRAL ONE  MAXIMUM ACCESS SURGERY, POSTERIOR LUMBAR INTERBODY FUSION , Removal of LUMBAR TWO TO LUMBAR FOUR  HARDWARE;  Surgeon: Eustace Moore, MD;  Location: [place] [facility];  Service: Neurosurgery;  Laterality: N/A;  . NISSEN FUNDOPLICATION    . REVERSE SHOULDER ARTHROPLASTY Right 05/28/2016   Procedure: RIGHT REVERSE SHOULDER ARTHROPLASTY;  Surgeon: Netta Cedars, MD;  Location: [place];  Service: Orthopedics;  Laterality: Right;  . ROBOT ASSISTED LAPAROSCOPIC NEPHRECTOMY  08/23/2011   Procedure: ROBOTIC ASSISTED LAPAROSCOPIC NEPHRECTOMY;  Surgeon: Dutch Gray, MD;  [facility];  Service: Urology;  Laterality: Left;  Left Robotic Assisted  Laparoscopic Partial Nephrectomy   . ROTATOR CUFF REPAIR     right side X 2  . TONSILLECTOMY    . TOTAL SHOULDER REVISION Right 08/19/2017   Procedure: RIGHT SHOULDER POLYETHYLENE EXCHANGE;  Surgeon: Netta Cedars, MD;   Location: [place];  Service: Orthopedics;  Laterality: Right;    Family Psychiatric History: See below  Family History:  Family History  Problem Relation Age of Onset  . Bipolar disorder Daughter   . Colon cancer Neg Hx     Social History:  Social History   Socioeconomic History  . Marital status: Divorced    Spouse name: Not on file  . Number of children: Not on file  . Years of education: Not on file  . Highest education level: Not on file  Occupational History  . Not on file  Social Needs  . Financial resource strain: Not on file  . Food insecurity    Worry: Not on file    Inability: Not on file  . Transportation needs    Medical: Not on file    Non-medical: Not on file  Tobacco Use  . Smoking status: Never Smoker  . Smokeless tobacco: Never Used  Substance and Sexual Activity  . Alcohol use: No  . Drug use: No  .  Sexual activity: Not Currently  Lifestyle  . Physical activity    Days per week: Not on file    Minutes per session: Not on file  . Stress: Not on file  Relationships  . Social Herbalist on phone: Not on file    Gets together: Not on file    Attends religious service: Not on file    Active member of club or organization: Not on file    Attends meetings of clubs or organizations: Not on file    Relationship status: Not on file  Other Topics Concern  . Not on file  Social History Narrative  . Not on file    Allergies:  Allergies  Allergen Reactions  . Shellfish Allergy Anaphylaxis  . Neurontin [Gabapentin] Other (See Comments)    Unable to talk when takes medication  . Chlorhexidine Gluconate Rash and Other (See Comments)    burning  . Codeine Itching     itching  . Haldol [Haloperidol Lactate] Other (See Comments)    Muscle spasms  . Hydromorphone Hcl Nausea And Vomiting  . Latex Rash    adhesives  . Propoxyphene N-Acetaminophen Nausea And Vomiting  . Vicodin [Hydrocodone-Acetaminophen] Itching    Metabolic Disorder  Labs: No results found for: HGBA1C, MPG No results found for: PROLACTIN No results found for: CHOL, TRIG, HDL, CHOLHDL, VLDL, LDLCALC Lab Results  Component Value Date   TSH 2.25 01/28/2016    Therapeutic Level Labs: No results found for: LITHIUM No results found for: VALPROATE No components found for:  CBMZ  Current Medications: Current Outpatient Medications  Medication Sig Dispense Refill  . ALPRAZolam (XANAX) 1 MG tablet Take 1 tablet (1 mg total) by mouth 4 (four) times daily. 120 tablet 2  . atorvastatin (LIPITOR) 20 MG tablet TAKE 1 TABLET (90m) BY MOUTH EVERYDAY AT BEDTIME  3  . cholestyramine (QUESTRAN) 4 GM/DOSE powder TAKE 1 SCOOPFUL OF 4 GRAMS AND MIX WITH LIQUID OR APPLESAUCE AS DIRECTED ON PACKAGING AND TAKE BY MOUTH DAILY - DO NOT TAKE WITHIN 2 HRS OF OTHER MEDS 378 g 1  . FLUoxetine (PROZAC) 40 MG capsule Take 1 capsule (40 mg total) by mouth 2 (two) times daily. 180 capsule 2  . meloxicam (MOBIC) 7.5 MG tablet Take 7.5 mg by mouth 2 (two) times daily.    . methocarbamol (ROBAXIN) 500 MG tablet Take 1 tablet (500 mg total) by mouth every 6 (six) hours as needed for muscle spasms. 30 tablet 0  . Multiple Vitamin (MULTIVITAMIN WITH MINERALS) TABS Take 2 tablets by mouth every evening.     . Multiple Vitamins-Minerals (HAIR/SKIN/NAILS) CAPS Take 2 tablets by mouth daily.    . naproxen sodium (ALEVE) 220 MG tablet Take 440 mg by mouth 2 (two) times daily as needed (pain).    . nitroGLYCERIN (NITROSTAT) 0.4 MG SL tablet Place 0.4 mg under the tongue every 5 (five) minutes as needed for chest pain.    .Marland KitchenOLANZapine (ZYPREXA) 20 MG tablet Take 1 tablet (20 mg total) by mouth at bedtime. 90 tablet 2  . oxybutynin (DITROPAN XL) 15 MG 24 hr tablet Take 15 mg by mouth daily.  11  . rOPINIRole (REQUIP) 3 MG tablet TAKE 1 TABLET (312m BY MOUTH 3 TIMES DAILY.  11  . traZODone (DESYREL) 100 MG tablet Take 1 tablet (100 mg total) by mouth at bedtime. 90 tablet 2  . Vitamin D,  Ergocalciferol, (DRISDOL) 50000 units CAPS capsule Take 50,000 Units by mouth every  7 (seven) days. Fridays     No current facility-administered medications for this visit.      Musculoskeletal: Strength & Muscle Tone: within normal limits Gait & Station: normal Patient leans: N/A  Psychiatric Specialty Exam: Review of Systems  All other systems reviewed and are negative.   There were no vitals taken for this visit.There is no height or weight on file to calculate BMI.  General Appearance: NA  Eye Contact:  NA  Speech:  Clear and Coherent  Volume:  Normal  Mood:  Euthymic  Affect:  NA  Thought Process:  Goal Directed  Orientation:  Full (Time, Place, and Person)  Thought Content: WDL   Suicidal Thoughts:  No  Homicidal Thoughts:  No  Memory:  Immediate;   Good Recent;   Good Remote;   Good  Judgement:  Good  Insight:  Good  Psychomotor Activity:  Normal  Concentration:  Concentration: Good and Attention Span: Good  Recall:  Good  Fund of Knowledge: Good  Language: Good  Akathisia:  No  Handed:  Right  AIMS (if indicated): not done  Assets:  Communication Skills Desire for Improvement Resilience Social Support Talents/Skills  ADL's:  Intact  Cognition: WNL  Sleep:  Good   Screenings:   Assessment and Plan: This patient is a 73 year old female with a history of bipolar disorder.  She continues to do well on her current regimen.  She will continue Xanax 1 mg 4 times daily for anxiety, Prozac 40 mg twice daily for depression, olanzapine 20 mg at bedtime for mood stabilization and trazodone 100 mg at bedtime for sleep.  She will return to see me in 3 months   Levonne Spiller, MD 05/29/2019, 2:17 PM

## 2019-06-13 ENCOUNTER — Other Ambulatory Visit: Payer: Self-pay | Admitting: Nurse Practitioner

## 2019-06-26 DIAGNOSIS — N3946 Mixed incontinence: Secondary | ICD-10-CM | POA: Diagnosis not present

## 2019-06-26 DIAGNOSIS — N3944 Nocturnal enuresis: Secondary | ICD-10-CM | POA: Diagnosis not present

## 2019-07-13 ENCOUNTER — Other Ambulatory Visit: Payer: Self-pay | Admitting: Gastroenterology

## 2019-07-13 NOTE — Telephone Encounter (Signed)
We haven't seen in over 3 years. Needs office visit.

## 2019-07-17 ENCOUNTER — Encounter: Payer: Self-pay | Admitting: Internal Medicine

## 2019-07-17 NOTE — Telephone Encounter (Signed)
PATIENT SCHEDULED AND LETTER SENT  °

## 2019-07-22 DIAGNOSIS — Z23 Encounter for immunization: Secondary | ICD-10-CM | POA: Diagnosis not present

## 2019-07-31 DIAGNOSIS — N3946 Mixed incontinence: Secondary | ICD-10-CM | POA: Diagnosis not present

## 2019-08-01 ENCOUNTER — Ambulatory Visit: Payer: Medicare Other | Admitting: Gastroenterology

## 2019-08-08 ENCOUNTER — Ambulatory Visit: Payer: Medicare Other | Admitting: Gastroenterology

## 2019-08-10 ENCOUNTER — Other Ambulatory Visit: Payer: Self-pay

## 2019-08-15 ENCOUNTER — Encounter: Payer: Self-pay | Admitting: Gastroenterology

## 2019-08-15 ENCOUNTER — Ambulatory Visit (INDEPENDENT_AMBULATORY_CARE_PROVIDER_SITE_OTHER): Payer: Medicare Other | Admitting: Gastroenterology

## 2019-08-15 ENCOUNTER — Other Ambulatory Visit: Payer: Self-pay

## 2019-08-15 DIAGNOSIS — R197 Diarrhea, unspecified: Secondary | ICD-10-CM | POA: Diagnosis not present

## 2019-08-15 MED ORDER — CHOLESTYRAMINE 4 GM/DOSE PO POWD: ORAL | 11 refills | Status: DC

## 2019-08-15 NOTE — Patient Instructions (Signed)
Continue to take Questran daily as you are doing.  We will see you back in January/February 2021 in person to arrange screening colonoscopy.   It was a pleasure to talk with you today. I want to create trusting relationships with patients to provide genuine, compassionate, and quality care. I value your feedback. If you receive a survey regarding your visit,  I greatly appreciate you taking time to fill this out.   Annitta Needs, PhD, ANP-BC [facility]

## 2019-08-15 NOTE — Progress Notes (Signed)
Primary Care Physician:  Cory Munch, PA-C  Primary GI: Dr. Gala Romney  Patient Location: Home   Provider Location: [facility] office   Reason for Visit: Follow-up for med refills.    Persons present on the virtual encounter, with roles: NP and patient   Total time (minutes) spent on medical discussion: 10 minutes   Due to COVID-19, visit was conducted using virtual method.  Visit was requested by patient.  Virtual Visit via Telephone Note Due to COVID-19, visit is conducted virtually and was requested by patient.   I connected with Victoria Mccarthy on 08/15/19 at  3:00 PM EST by telephone and verified that I am speaking with the correct person using two identifiers.   I discussed the limitations, risks, security and privacy concerns of performing an evaluation and management service by telephone and the availability of in person appointments. I also discussed with the patient that there may be a patient responsible charge related to this service. The patient expressed understanding and agreed to proceed.  Chief Complaint  Patient presents with  . Medication Refill    Victoria Mccarthy -doing well     History of Present Illness: 73 year old female with history of chronic diarrhea, historically with diarrhea controlled on low-dose Questran. Celiac screen negative in past.   Questran continues to do well, as long as she remembers to take it. If misses it, she knows it the next day. BMs are usually one per day. No straining. No abdominal pain. No rectal bleeding. No GERD. No dysphagia. Colonoscopy due 2021.    Past Medical History:  Diagnosis Date  . Anemia    hx  . Anxiety   . Arthritis    chronic back pain  . Bipolar disorder ([place])   . Cancer [facility]) Jan 2013   kidney cancer, left, s/p partial nephrectomy  . Depression   . Diarrhea    incontinent stools  . Diverticulitis    treated at least 5 times in past, hospitalized twice  . Heart murmur   . History of hiatal hernia    s/p   . MVP (mitral valve prolapse)   . Neuropathy   . Nocturia   . Osteoarthritis   . Osteoporosis   . PONV (postoperative nausea and vomiting)   . Psychotic affective disorder ([place])    "psychotic delusions" "I cut myself"   . Restless leg syndrome   . Rheumatic fever   . Urinary frequency      Past Surgical History:  Procedure Laterality Date  . ABDOMINAL HYSTERECTOMY    . ANTERIOR CERVICAL DECOMP/DISCECTOMY FUSION N/A 09/28/2017   Procedure: ANTERIOR CERVICAL DECOMPRESSION/DISCECTOMY FUSION CERVICAL FOUR- CERVICAL FIVE, CERVICAL FIVE- CERVICAL SIX, CERVICAL SIX- CERVICAL SEVEN;  Surgeon: Eustace Moore, MD;  Location: [place];  Service: Neurosurgery;  Laterality: N/A;  ANTERIOR CERVICAL DECOMPRESSION/DISCECTOMY FUSION CERVICAL FOUR- CERVICAL FIVE, CERVICAL FIVE- CERVICAL SIX, CERVICAL SIX- CERVICAL SEVEN  . BACK SURGERY    . BACK SURGERY     2 plates, 8 screws in back  . carpell tunnell     rt wrist  x2  . CATARACT EXTRACTION W/PHACO Left 06/14/2016   Procedure: CATARACT EXTRACTION PHACO AND INTRAOCULAR LENS PLACEMENT; CDE:  3.92;  Surgeon: Williams Che, MD;  [facility];  Service: Ophthalmology;  Laterality: Left;  . CHOLECYSTECTOMY    . COLONOSCOPY  5/02   pancolonic deverticula, internal hemorrhoids  . COLONOSCOPY  1/11   single external hemorrhoidal tag and anal papilla otherwise normal rectum, pancolonic diverticula,  s/p sigmoid biopsy and stool sampling all unremarkable  . ESOPHAGOGASTRODUODENOSCOPY  04/06/2010   intact Nissen fundoplication S/P dilation, somewhat baggy atonic appearing esophagus, 58-F dilation  . ESOPHAGOGASTRODUODENOSCOPY  1/11   nomral esophagus s/p 54-F Maloney dilation, normal/intact Nissen fundoplication, diffuse patchy erythema and erosions likely NSAID/ASA effect with benign biopsy  . EYE SURGERY     right eye cataract  . kidney surgery for cancer    . MAXIMUM ACCESS (MAS)POSTERIOR LUMBAR INTERBODY FUSION (PLIF) 1 LEVEL N/A 07/25/2014   Procedure:  FOR MAXIMUM ACCESS (MAS) POSTERIOR LUMBAR INTERBODY FUSION (PLIF) Lumbar two/three, Removal of hardware Lumbar three/four;  Surgeon: Eustace Moore, MD;  Location: [place] [facility];  Service: Neurosurgery;  Laterality: N/A;  . MAXIMUM ACCESS (MAS)POSTERIOR LUMBAR INTERBODY FUSION (PLIF) 2 LEVEL N/A 09/11/2015   Procedure: LUMBAR FOUR-FIVE LUMBAR FIVE SACRAL ONE  MAXIMUM ACCESS SURGERY, POSTERIOR LUMBAR INTERBODY FUSION , Removal of LUMBAR TWO TO LUMBAR FOUR  HARDWARE;  Surgeon: Eustace Moore, MD;  Location: [place] [facility];  Service: Neurosurgery;  Laterality: N/A;  . NISSEN FUNDOPLICATION    . REVERSE SHOULDER ARTHROPLASTY Right 05/28/2016   Procedure: RIGHT REVERSE SHOULDER ARTHROPLASTY;  Surgeon: Netta Cedars, MD;  Location: [place];  Service: Orthopedics;  Laterality: Right;  . ROBOT ASSISTED LAPAROSCOPIC NEPHRECTOMY  08/23/2011   Procedure: ROBOTIC ASSISTED LAPAROSCOPIC NEPHRECTOMY;  Surgeon: Dutch Gray, MD;  [facility];  Service: Urology;  Laterality: Left;  Left Robotic Assisted  Laparoscopic Partial Nephrectomy   . ROTATOR CUFF REPAIR     right side X 2  . TONSILLECTOMY    . TOTAL SHOULDER REVISION Right 08/19/2017   Procedure: RIGHT SHOULDER POLYETHYLENE EXCHANGE;  Surgeon: Netta Cedars, MD;  Location: [place];  Service: Orthopedics;  Laterality: Right;     Current Meds  Medication Sig  . ALPRAZolam (XANAX) 1 MG tablet Take 1 tablet (1 mg total) by mouth 4 (four) times daily.  . Ascorbic Acid (VITA-C PO) Take by mouth daily.  . Cholecalciferol (VITAMIN D3 PO) Take by mouth daily.  . cholestyramine (QUESTRAN) 4 GM/DOSE powder TAKE 1 SCOOPFUL OF 4 GRAMS AND MIX WITH LIQUID OR APPLESAUCE AS DIRECTED ON PACKAGING AND TAKE BY MOUTH DAILY - DO NOT TAKE WITHIN 2 HRS OF OTHER MEDS  . FLUoxetine (PROZAC) 40 MG capsule Take 1 capsule (40 mg total) by mouth 2 (two) times daily.  . meloxicam (MOBIC) 7.5 MG tablet Take 7.5 mg by mouth 2 (two) times daily.  . methocarbamol (ROBAXIN) 500 MG tablet Take 1  tablet (500 mg total) by mouth every 6 (six) hours as needed for muscle spasms.  . Multiple Vitamin (MULTIVITAMIN WITH MINERALS) TABS Take 2 tablets by mouth every evening.   . Multiple Vitamins-Minerals (HAIR/SKIN/NAILS) CAPS Take 2 tablets by mouth daily.  . naproxen sodium (ALEVE) 220 MG tablet Take 440 mg by mouth 2 (two) times daily as needed (pain).  . nitroGLYCERIN (NITROSTAT) 0.4 MG SL tablet Place 0.4 mg under the tongue every 5 (five) minutes as needed for chest pain.  Marland Kitchen OLANZapine (ZYPREXA) 20 MG tablet Take 1 tablet (20 mg total) by mouth at bedtime.  Marland Kitchen oxybutynin (DITROPAN XL) 15 MG 24 hr tablet Take 15 mg by mouth daily.  Marland Kitchen rOPINIRole (REQUIP) 3 MG tablet TAKE 1 TABLET (3mg ) BY MOUTH 3 TIMES DAILY.  . [DISCONTINUED] cholestyramine (QUESTRAN) 4 GM/DOSE powder TAKE 1 SCOOPFUL OF 4 GRAMS AND MIX WITH LIQUID OR APPLESAUCE AS DIRECTED ON PACKAGING AND TAKE BY MOUTH DAILY - DO NOT TAKE  WITHIN 2 HRS OF OTHER MEDS     Family History  Problem Relation Age of Onset  . Bipolar disorder Daughter   . Colon cancer Neg Hx     Social History   Socioeconomic History  . Marital status: Divorced    Spouse name: Not on file  . Number of children: Not on file  . Years of education: Not on file  . Highest education level: Not on file  Occupational History  . Not on file  Social Needs  . Financial resource strain: Not on file  . Food insecurity    Worry: Not on file    Inability: Not on file  . Transportation needs    Medical: Not on file    Non-medical: Not on file  Tobacco Use  . Smoking status: Never Smoker  . Smokeless tobacco: Never Used  Substance and Sexual Activity  . Alcohol use: No  . Drug use: No  . Sexual activity: Not Currently  Lifestyle  . Physical activity    Days per week: Not on file    Minutes per session: Not on file  . Stress: Not on file  Relationships  . Social Herbalist on phone: Not on file    Gets together: Not on file    Attends  religious service: Not on file    Active member of club or organization: Not on file    Attends meetings of clubs or organizations: Not on file    Relationship status: Not on file  Other Topics Concern  . Not on file  Social History Narrative  . Not on file       Review of Systems: Gen: Denies fever, chills, anorexia. Denies fatigue, weakness, weight loss.  CV: Denies chest pain, palpitations, syncope, peripheral edema, and claudication. Resp: Denies dyspnea at rest, cough, wheezing, coughing up blood, and pleurisy. GI: see HPI Derm: Denies rash, itching, dry skin Psych: Denies depression, anxiety, memory loss, confusion. No homicidal or suicidal ideation.  Heme: Denies bruising, bleeding, and enlarged lymph nodes.  Observations/Objective: No distress. Unable to perform physical exam due to telephone encounter. No video available.   Assessment and Plan: Pleasant 73 year old female with history of chronic diarrhea, presenting for telephone visit due to chronic diarrhea and need for Questran refills. She is doing quite well without any concerning signs/symptoms. She is due for screening colonoscopy next year. As we have not seen her physically in several years, we will arrange an inperson visit in Jan/Feb 2021.   Follow Up Instructions: See AVS   I discussed the assessment and treatment plan with the patient. The patient was provided an opportunity to ask questions and all were answered. The patient agreed with the plan and demonstrated an understanding of the instructions.   The patient was advised to call back or seek an in-person evaluation if the symptoms worsen or if the condition fails to improve as anticipated.  I provided 10 minutes of non-face-to-face time during this encounter.  Annitta Needs, PhD, ANP-BC [facility]

## 2019-08-20 ENCOUNTER — Encounter: Payer: Self-pay | Admitting: Internal Medicine

## 2019-08-28 ENCOUNTER — Other Ambulatory Visit: Payer: Self-pay

## 2019-08-28 ENCOUNTER — Encounter (HOSPITAL_COMMUNITY): Payer: Self-pay | Admitting: Psychiatry

## 2019-08-28 ENCOUNTER — Ambulatory Visit (INDEPENDENT_AMBULATORY_CARE_PROVIDER_SITE_OTHER): Payer: Medicare Other | Admitting: Psychiatry

## 2019-08-28 DIAGNOSIS — F3130 Bipolar disorder, current episode depressed, mild or moderate severity, unspecified: Secondary | ICD-10-CM | POA: Diagnosis not present

## 2019-08-28 MED ORDER — OLANZAPINE 20 MG PO TABS: 20.0000 mg | ORAL_TABLET | Freq: Every day | ORAL | 2 refills | Status: DC

## 2019-08-28 MED ORDER — FLUOXETINE HCL 40 MG PO CAPS: 40.0000 mg | ORAL_CAPSULE | Freq: Two times a day (BID) | ORAL | 2 refills | Status: DC

## 2019-08-28 MED ORDER — ALPRAZOLAM 1 MG PO TABS: 1.0000 mg | ORAL_TABLET | Freq: Four times a day (QID) | ORAL | 2 refills | Status: DC

## 2019-08-28 NOTE — Progress Notes (Signed)
Virtual Visit via Telephone Note  I connected with Victoria Mccarthy on 08/28/19 at  2:20 PM EST by telephone and verified that I am speaking with the correct person using two identifiers.   I discussed the limitations, risks, security and privacy concerns of performing an evaluation and management service by telephone and the availability of in person appointments. I also discussed with the patient that there may be a patient responsible charge related to this service. The patient expressed understanding and agreed to proceed.     I discussed the assessment and treatment plan with the patient. The patient was provided an opportunity to ask questions and all were answered. The patient agreed with the plan and demonstrated an understanding of the instructions.   The patient was advised to call back or seek an in-person evaluation if the symptoms worsen or if the condition fails to improve as anticipated.  I provided 15 minutes of non-face-to-face time during this encounter.   Levonne Spiller, MD  [facility] MD/PA/NP OP Progress Note  08/28/2019 2:28 PM Victoria Mccarthy  MRN:  [id number]  Chief Complaint:  Chief Complaint    Depression; Manic Behavior; Follow-up     HPI: This patient is a 73 year old divorced white female who lives alone in [place].  She has 1 son, 1 daughter 6 grandchildren and 3 great-grandchildren.  She is a retired Clinical research associate.  The patient returns for follow-up after 3 months regarding bipolar disorder and anxiety.  She states that she is doing well.  Her mother died 3 years ago and she is finally getting over it.  She is living in her mother's home and she is going to try to do some upgrades.  She is finally getting rid of some of her mother's clothes and other things.  She was dating a man but they did not get along and she broke up with him.  She is now spending more time with her daughter and her great grandchildren.  She states that her mood is stable and she denies  being depressed or having manic symptoms.  She is sleeping well and her anxiety is under good control.  She denies auditory visual hallucinations delusions or thoughts of self-harm. Visit Diagnosis:    ICD-10-CM   1. Bipolar I disorder, most recent episode depressed ([place])  F31.30     Past Psychiatric History: Hospitalized twice in the 1980s, since then outpatient treatment for bipolar disorder  Past Medical History:  Past Medical History:  Diagnosis Date  . Anemia    hx  . Anxiety   . Arthritis    chronic back pain  . Bipolar disorder ([place])   . Cancer [facility]) Jan 2013   kidney cancer, left, s/p partial nephrectomy  . Depression   . Diarrhea    incontinent stools  . Diverticulitis    treated at least 5 times in past, hospitalized twice  . Heart murmur   . History of hiatal hernia    s/p  . MVP (mitral valve prolapse)   . Neuropathy   . Nocturia   . Osteoarthritis   . Osteoporosis   . PONV (postoperative nausea and vomiting)   . Psychotic affective disorder ([place])    "psychotic delusions" "I cut myself"   . Restless leg syndrome   . Rheumatic fever   . Urinary frequency     Past Surgical History:  Procedure Laterality Date  . ABDOMINAL HYSTERECTOMY    . ANTERIOR CERVICAL DECOMP/DISCECTOMY FUSION N/A 09/28/2017   Procedure:  ANTERIOR CERVICAL DECOMPRESSION/DISCECTOMY FUSION CERVICAL FOUR- CERVICAL FIVE, CERVICAL FIVE- CERVICAL SIX, CERVICAL SIX- CERVICAL SEVEN;  Surgeon: Eustace Moore, MD;  Location: [place];  Service: Neurosurgery;  Laterality: N/A;  ANTERIOR CERVICAL DECOMPRESSION/DISCECTOMY FUSION CERVICAL FOUR- CERVICAL FIVE, CERVICAL FIVE- CERVICAL SIX, CERVICAL SIX- CERVICAL SEVEN  . BACK SURGERY    . BACK SURGERY     2 plates, 8 screws in back  . carpell tunnell     rt wrist  x2  . CATARACT EXTRACTION W/PHACO Left 06/14/2016   Procedure: CATARACT EXTRACTION PHACO AND INTRAOCULAR LENS PLACEMENT; CDE:  3.92;  Surgeon: Williams Che, MD;  [facility];  Service:  Ophthalmology;  Laterality: Left;  . CHOLECYSTECTOMY    . COLONOSCOPY  5/02   pancolonic deverticula, internal hemorrhoids  . COLONOSCOPY  1/11   single external hemorrhoidal tag and anal papilla otherwise normal rectum, pancolonic diverticula, s/p sigmoid biopsy and stool sampling all unremarkable  . ESOPHAGOGASTRODUODENOSCOPY  04/06/2010   intact Nissen fundoplication S/P dilation, somewhat baggy atonic appearing esophagus, 58-F dilation  . ESOPHAGOGASTRODUODENOSCOPY  1/11   nomral esophagus s/p 54-F Maloney dilation, normal/intact Nissen fundoplication, diffuse patchy erythema and erosions likely NSAID/ASA effect with benign biopsy  . EYE SURGERY     right eye cataract  . kidney surgery for cancer    . MAXIMUM ACCESS (MAS)POSTERIOR LUMBAR INTERBODY FUSION (PLIF) 1 LEVEL N/A 07/25/2014   Procedure: FOR MAXIMUM ACCESS (MAS) POSTERIOR LUMBAR INTERBODY FUSION (PLIF) Lumbar two/three, Removal of hardware Lumbar three/four;  Surgeon: Eustace Moore, MD;  Location: [place] [facility];  Service: Neurosurgery;  Laterality: N/A;  . MAXIMUM ACCESS (MAS)POSTERIOR LUMBAR INTERBODY FUSION (PLIF) 2 LEVEL N/A 09/11/2015   Procedure: LUMBAR FOUR-FIVE LUMBAR FIVE SACRAL ONE  MAXIMUM ACCESS SURGERY, POSTERIOR LUMBAR INTERBODY FUSION , Removal of LUMBAR TWO TO LUMBAR FOUR  HARDWARE;  Surgeon: Eustace Moore, MD;  Location: [place] [facility];  Service: Neurosurgery;  Laterality: N/A;  . NISSEN FUNDOPLICATION    . REVERSE SHOULDER ARTHROPLASTY Right 05/28/2016   Procedure: RIGHT REVERSE SHOULDER ARTHROPLASTY;  Surgeon: Netta Cedars, MD;  Location: [place];  Service: Orthopedics;  Laterality: Right;  . ROBOT ASSISTED LAPAROSCOPIC NEPHRECTOMY  08/23/2011   Procedure: ROBOTIC ASSISTED LAPAROSCOPIC NEPHRECTOMY;  Surgeon: Dutch Gray, MD;  [facility];  Service: Urology;  Laterality: Left;  Left Robotic Assisted  Laparoscopic Partial Nephrectomy   . ROTATOR CUFF REPAIR     right side X 2  . TONSILLECTOMY    . TOTAL SHOULDER  REVISION Right 08/19/2017   Procedure: RIGHT SHOULDER POLYETHYLENE EXCHANGE;  Surgeon: Netta Cedars, MD;  Location: [place];  Service: Orthopedics;  Laterality: Right;    Family Psychiatric History: see below  Family History:  Family History  Problem Relation Age of Onset  . Bipolar disorder Daughter   . Colon cancer Neg Hx     Social History:  Social History   Socioeconomic History  . Marital status: Divorced    Spouse name: Not on file  . Number of children: Not on file  . Years of education: Not on file  . Highest education level: Not on file  Occupational History  . Not on file  Social Needs  . Financial resource strain: Not on file  . Food insecurity    Worry: Not on file    Inability: Not on file  . Transportation needs    Medical: Not on file    Non-medical: Not on file  Tobacco Use  . Smoking status: Never Smoker  .  Smokeless tobacco: Never Used  Substance and Sexual Activity  . Alcohol use: No  . Drug use: No  . Sexual activity: Not Currently  Lifestyle  . Physical activity    Days per week: Not on file    Minutes per session: Not on file  . Stress: Not on file  Relationships  . Social Herbalist on phone: Not on file    Gets together: Not on file    Attends religious service: Not on file    Active member of club or organization: Not on file    Attends meetings of clubs or organizations: Not on file    Relationship status: Not on file  Other Topics Concern  . Not on file  Social History Narrative  . Not on file    Allergies:  Allergies  Allergen Reactions  . Shellfish Allergy Anaphylaxis  . Neurontin [Gabapentin] Other (See Comments)    Unable to talk when takes medication  . Chlorhexidine Gluconate Rash and Other (See Comments)    burning  . Codeine Itching     itching  . Haldol [Haloperidol Lactate] Other (See Comments)    Muscle spasms  . Hydromorphone Hcl Nausea And Vomiting  . Latex Rash    adhesives  . Propoxyphene  N-Acetaminophen Nausea And Vomiting  . Vicodin [Hydrocodone-Acetaminophen] Itching    Metabolic Disorder Labs: No results found for: HGBA1C, MPG No results found for: PROLACTIN No results found for: CHOL, TRIG, HDL, CHOLHDL, VLDL, LDLCALC Lab Results  Component Value Date   TSH 2.25 01/28/2016    Therapeutic Level Labs: No results found for: LITHIUM No results found for: VALPROATE No components found for:  CBMZ  Current Medications: Current Outpatient Medications  Medication Sig Dispense Refill  . ALPRAZolam (XANAX) 1 MG tablet Take 1 tablet (1 mg total) by mouth 4 (four) times daily. 120 tablet 2  . Ascorbic Acid (VITA-C PO) Take by mouth daily.    . Cholecalciferol (VITAMIN D3 PO) Take by mouth daily.    . cholestyramine (QUESTRAN) 4 GM/DOSE powder TAKE 1 SCOOPFUL OF 4 GRAMS AND MIX WITH LIQUID OR APPLESAUCE AS DIRECTED ON PACKAGING AND TAKE BY MOUTH DAILY - DO NOT TAKE WITHIN 2 HRS OF OTHER MEDS 378 g 11  . FLUoxetine (PROZAC) 40 MG capsule Take 1 capsule (40 mg total) by mouth 2 (two) times daily. 180 capsule 2  . meloxicam (MOBIC) 7.5 MG tablet Take 7.5 mg by mouth 2 (two) times daily.    . methocarbamol (ROBAXIN) 500 MG tablet Take 1 tablet (500 mg total) by mouth every 6 (six) hours as needed for muscle spasms. 30 tablet 0  . Multiple Vitamin (MULTIVITAMIN WITH MINERALS) TABS Take 2 tablets by mouth every evening.     . Multiple Vitamins-Minerals (HAIR/SKIN/NAILS) CAPS Take 2 tablets by mouth daily.    . naproxen sodium (ALEVE) 220 MG tablet Take 440 mg by mouth 2 (two) times daily as needed (pain).    . nitroGLYCERIN (NITROSTAT) 0.4 MG SL tablet Place 0.4 mg under the tongue every 5 (five) minutes as needed for chest pain.    Marland Kitchen OLANZapine (ZYPREXA) 20 MG tablet Take 1 tablet (20 mg total) by mouth at bedtime. 90 tablet 2  . oxybutynin (DITROPAN XL) 15 MG 24 hr tablet Take 15 mg by mouth daily.  11  . rOPINIRole (REQUIP) 3 MG tablet TAKE 1 TABLET (3mg ) BY MOUTH 3 TIMES DAILY.   11   No current facility-administered medications for this visit.  Musculoskeletal: Strength & Muscle Tone: within normal limits Gait & Station: normal Patient leans: N/A  Psychiatric Specialty Exam: Review of Systems  Genitourinary: Positive for frequency and urgency.  All other systems reviewed and are negative.   There were no vitals taken for this visit.There is no height or weight on file to calculate BMI.  General Appearance: NA  Eye Contact:  NA  Speech:  Clear and Coherent  Volume:  Normal  Mood:  Euthymic  Affect:  NA  Thought Process:  Goal Directed  Orientation:  Full (Time, Place, and Person)  Thought Content: WDL   Suicidal Thoughts:  No  Homicidal Thoughts:  No  Memory:  Immediate;   Good Recent;   Good Remote;   Fair  Judgement:  Good  Insight:  Fair  Psychomotor Activity:  Normal  Concentration:  Concentration: Good and Attention Span: Good  Recall:  Good  Fund of Knowledge: Good  Language: Good  Akathisia:  No  Handed:  Right  AIMS (if indicated): not done  Assets:  Communication Skills Desire for Improvement Physical Health Resilience Social Support Talents/Skills  ADL's:  Intact  Cognition: WNL  Sleep:  Good   Screenings:   Assessment and Plan: This patient is a 73 year old female with a history of bipolar disorder.  She continues to do very well on her current regimen.  She will continue Xanax 1 mg 4 times daily for anxiety, Prozac 40 mg twice daily for depression, olanzapine 20 mg at bedtime for mood stabilization and trazodone 100 mg at bedtime for sleep.  She will return to see me in 3 months   Levonne Spiller, MD 08/28/2019, 2:28 PM

## 2019-10-03 DIAGNOSIS — N3941 Urge incontinence: Secondary | ICD-10-CM | POA: Diagnosis not present

## 2019-10-04 DIAGNOSIS — N3281 Overactive bladder: Secondary | ICD-10-CM | POA: Diagnosis not present

## 2019-10-04 DIAGNOSIS — N189 Chronic kidney disease, unspecified: Secondary | ICD-10-CM | POA: Diagnosis not present

## 2019-10-04 DIAGNOSIS — N3946 Mixed incontinence: Secondary | ICD-10-CM | POA: Diagnosis not present

## 2019-10-12 DIAGNOSIS — N3944 Nocturnal enuresis: Secondary | ICD-10-CM | POA: Diagnosis not present

## 2019-10-12 DIAGNOSIS — N3946 Mixed incontinence: Secondary | ICD-10-CM | POA: Diagnosis not present

## 2019-10-19 ENCOUNTER — Other Ambulatory Visit (HOSPITAL_COMMUNITY): Payer: Self-pay | Admitting: Psychiatry

## 2019-10-25 ENCOUNTER — Other Ambulatory Visit (HOSPITAL_COMMUNITY): Payer: Self-pay | Admitting: Psychiatry

## 2019-10-25 MED ORDER — OLANZAPINE 20 MG PO TABS: 20.0000 mg | ORAL_TABLET | Freq: Every day | ORAL | 2 refills | Status: DC

## 2019-10-25 MED ORDER — TRAZODONE HCL 100 MG PO TABS: 100.0000 mg | ORAL_TABLET | Freq: Every day | ORAL | 2 refills | Status: DC

## 2019-10-25 MED ORDER — FLUOXETINE HCL 40 MG PO CAPS: 40.0000 mg | ORAL_CAPSULE | Freq: Two times a day (BID) | ORAL | 2 refills | Status: DC

## 2019-10-31 ENCOUNTER — Ambulatory Visit: Payer: Medicare Other | Admitting: Gastroenterology

## 2019-10-31 ENCOUNTER — Other Ambulatory Visit (HOSPITAL_COMMUNITY): Payer: Self-pay | Admitting: Psychiatry

## 2019-11-05 ENCOUNTER — Telehealth (HOSPITAL_COMMUNITY): Payer: Self-pay | Admitting: *Deleted

## 2019-11-05 ENCOUNTER — Other Ambulatory Visit (HOSPITAL_COMMUNITY): Payer: Self-pay | Admitting: Psychiatry

## 2019-11-05 MED ORDER — OLANZAPINE 20 MG PO TABS: 20.0000 mg | ORAL_TABLET | Freq: Every day | ORAL | 2 refills | Status: DC

## 2019-11-05 MED ORDER — ALPRAZOLAM 1 MG PO TABS: 1.0000 mg | ORAL_TABLET | Freq: Four times a day (QID) | ORAL | 2 refills | Status: DC

## 2019-11-05 MED ORDER — TRAZODONE HCL 100 MG PO TABS: 100.0000 mg | ORAL_TABLET | Freq: Every day | ORAL | 2 refills | Status: DC

## 2019-11-05 MED ORDER — FLUOXETINE HCL 40 MG PO CAPS: 40.0000 mg | ORAL_CAPSULE | Freq: Two times a day (BID) | ORAL | 2 refills | Status: DC

## 2019-11-05 NOTE — Telephone Encounter (Signed)
Patient has called stated that she is now using  [facility]  & that all med's need to be sent. Updated in EPIC

## 2019-11-05 NOTE — Telephone Encounter (Signed)
sent 

## 2019-11-27 ENCOUNTER — Ambulatory Visit (HOSPITAL_COMMUNITY): Payer: Medicare Other | Admitting: Psychiatry

## 2019-11-27 ENCOUNTER — Other Ambulatory Visit: Payer: Self-pay

## 2019-12-20 ENCOUNTER — Encounter (HOSPITAL_COMMUNITY): Payer: Self-pay | Admitting: Psychiatry

## 2019-12-20 ENCOUNTER — Other Ambulatory Visit: Payer: Self-pay

## 2019-12-20 ENCOUNTER — Ambulatory Visit (INDEPENDENT_AMBULATORY_CARE_PROVIDER_SITE_OTHER): Payer: Medicare HMO | Admitting: Psychiatry

## 2019-12-20 DIAGNOSIS — F3130 Bipolar disorder, current episode depressed, mild or moderate severity, unspecified: Secondary | ICD-10-CM | POA: Diagnosis not present

## 2019-12-20 MED ORDER — ALPRAZOLAM 1 MG PO TABS: 1.0000 mg | ORAL_TABLET | Freq: Four times a day (QID) | ORAL | 2 refills | Status: DC

## 2019-12-20 MED ORDER — FLUOXETINE HCL 40 MG PO CAPS: 40.0000 mg | ORAL_CAPSULE | Freq: Two times a day (BID) | ORAL | 2 refills | Status: DC

## 2019-12-20 MED ORDER — OLANZAPINE 20 MG PO TABS: 20.0000 mg | ORAL_TABLET | Freq: Every day | ORAL | 2 refills | Status: DC

## 2019-12-20 MED ORDER — TRAZODONE HCL 100 MG PO TABS: 100.0000 mg | ORAL_TABLET | Freq: Every day | ORAL | 2 refills | Status: DC

## 2019-12-20 NOTE — Progress Notes (Signed)
Virtual Visit via Telephone Note  I connected with Victoria Mccarthy on 12/20/19 at  2:40 PM EDT by telephone and verified that I am speaking with the correct person using two identifiers.   I discussed the limitations, risks, security and privacy concerns of performing an evaluation and management service by telephone and the availability of in person appointments. I also discussed with the patient that there may be a patient responsible charge related to this service. The patient expressed understanding and agreed to proceed.     I discussed the assessment and treatment plan with the patient. The patient was provided an opportunity to ask questions and all were answered. The patient agreed with the plan and demonstrated an understanding of the instructions.   The patient was advised to call back or seek an in-person evaluation if the symptoms worsen or if the condition fails to improve as anticipated.  I provided 15 minutes of non-face-to-face time during this encounter.   Levonne Spiller, MD  [facility] MD/PA/NP OP Progress Note  12/20/2019 2:58 PM Victoria Mccarthy  MRN:  [id number]  Chief Complaint:  Chief Complaint    Manic Behavior; Depression; Anxiety; Follow-up     HPI: This patient is a 74 year old divorced white female who lives alone in [place].  She has 1 son and 1 granddaughter 6 grandchildren and 3 great-grandchildren.  She is a retired Therapist, art.  The patient returns for follow-up after 3 months regarding bipolar disorder and anxiety.  She states that overall she is doing okay but does have a lot of fatigue.  She recently had a physical and all her lab work was normal.  She does suffer from chronic back and knee pain and she thinks this is getting her down.  It is difficult for her to do activities for any great length of time without pain.  For the most part she is sleeping okay.  She still trying to slowly clean out her mother's home.  She states that she feels lonely.  She broke off  a relationship a few months ago and spends most of her time alone.  She and her son do not get along and her daughter is busy.  I urged her to contact her daughter to try to do more things together.  However she denies visual or auditory hallucinations suicidal ideation or severe anxiety. Visit Diagnosis:    ICD-10-CM   1. Bipolar I disorder, most recent episode depressed ([place])  F31.30     Past Psychiatric History: Hospitalized twice in the 1980s, since then outpatient treatment for bipolar disorder  Past Medical History:  Past Medical History:  Diagnosis Date  . Anemia    hx  . Anxiety   . Arthritis    chronic back pain  . Bipolar disorder ([place])   . Cancer [facility]) Jan 2013   kidney cancer, left, s/p partial nephrectomy  . Depression   . Diarrhea    incontinent stools  . Diverticulitis    treated at least 5 times in past, hospitalized twice  . Heart murmur   . History of hiatal hernia    s/p  . MVP (mitral valve prolapse)   . Neuropathy   . Nocturia   . Osteoarthritis   . Osteoporosis   . PONV (postoperative nausea and vomiting)   . Psychotic affective disorder ([place])    "psychotic delusions" "I cut myself"   . Restless leg syndrome   . Rheumatic fever   . Urinary frequency  Past Surgical History:  Procedure Laterality Date  . ABDOMINAL HYSTERECTOMY    . ANTERIOR CERVICAL DECOMP/DISCECTOMY FUSION N/A 09/28/2017   Procedure: ANTERIOR CERVICAL DECOMPRESSION/DISCECTOMY FUSION CERVICAL FOUR- CERVICAL FIVE, CERVICAL FIVE- CERVICAL SIX, CERVICAL SIX- CERVICAL SEVEN;  Surgeon: Eustace Moore, MD;  Location: [place];  Service: Neurosurgery;  Laterality: N/A;  ANTERIOR CERVICAL DECOMPRESSION/DISCECTOMY FUSION CERVICAL FOUR- CERVICAL FIVE, CERVICAL FIVE- CERVICAL SIX, CERVICAL SIX- CERVICAL SEVEN  . BACK SURGERY    . BACK SURGERY     2 plates, 8 screws in back  . carpell tunnell     rt wrist  x2  . CATARACT EXTRACTION W/PHACO Left 06/14/2016   Procedure: CATARACT EXTRACTION PHACO  AND INTRAOCULAR LENS PLACEMENT; CDE:  3.92;  Surgeon: Williams Che, MD;  [facility];  Service: Ophthalmology;  Laterality: Left;  . CHOLECYSTECTOMY    . COLONOSCOPY  5/02   pancolonic deverticula, internal hemorrhoids  . COLONOSCOPY  1/11   single external hemorrhoidal tag and anal papilla otherwise normal rectum, pancolonic diverticula, s/p sigmoid biopsy and stool sampling all unremarkable  . ESOPHAGOGASTRODUODENOSCOPY  04/06/2010   intact Nissen fundoplication S/P dilation, somewhat baggy atonic appearing esophagus, 58-F dilation  . ESOPHAGOGASTRODUODENOSCOPY  1/11   nomral esophagus s/p 54-F Maloney dilation, normal/intact Nissen fundoplication, diffuse patchy erythema and erosions likely NSAID/ASA effect with benign biopsy  . EYE SURGERY     right eye cataract  . kidney surgery for cancer    . MAXIMUM ACCESS (MAS)POSTERIOR LUMBAR INTERBODY FUSION (PLIF) 1 LEVEL N/A 07/25/2014   Procedure: FOR MAXIMUM ACCESS (MAS) POSTERIOR LUMBAR INTERBODY FUSION (PLIF) Lumbar two/three, Removal of hardware Lumbar three/four;  Surgeon: Eustace Moore, MD;  Location: [place] [facility];  Service: Neurosurgery;  Laterality: N/A;  . MAXIMUM ACCESS (MAS)POSTERIOR LUMBAR INTERBODY FUSION (PLIF) 2 LEVEL N/A 09/11/2015   Procedure: LUMBAR FOUR-FIVE LUMBAR FIVE SACRAL ONE  MAXIMUM ACCESS SURGERY, POSTERIOR LUMBAR INTERBODY FUSION , Removal of LUMBAR TWO TO LUMBAR FOUR  HARDWARE;  Surgeon: Eustace Moore, MD;  Location: [place] [facility];  Service: Neurosurgery;  Laterality: N/A;  . NISSEN FUNDOPLICATION    . REVERSE SHOULDER ARTHROPLASTY Right 05/28/2016   Procedure: RIGHT REVERSE SHOULDER ARTHROPLASTY;  Surgeon: Netta Cedars, MD;  Location: [place];  Service: Orthopedics;  Laterality: Right;  . ROBOT ASSISTED LAPAROSCOPIC NEPHRECTOMY  08/23/2011   Procedure: ROBOTIC ASSISTED LAPAROSCOPIC NEPHRECTOMY;  Surgeon: Dutch Gray, MD;  [facility];  Service: Urology;  Laterality: Left;  Left Robotic Assisted  Laparoscopic  Partial Nephrectomy   . ROTATOR CUFF REPAIR     right side X 2  . TONSILLECTOMY    . TOTAL SHOULDER REVISION Right 08/19/2017   Procedure: RIGHT SHOULDER POLYETHYLENE EXCHANGE;  Surgeon: Netta Cedars, MD;  Location: [place];  Service: Orthopedics;  Laterality: Right;    Family Psychiatric History: see below  Family History:  Family History  Problem Relation Age of Onset  . Bipolar disorder Daughter   . Colon cancer Neg Hx     Social History:  Social History   Socioeconomic History  . Marital status: Divorced    Spouse name: Not on file  . Number of children: Not on file  . Years of education: Not on file  . Highest education level: Not on file  Occupational History  . Not on file  Tobacco Use  . Smoking status: Never Smoker  . Smokeless tobacco: Never Used  Substance and Sexual Activity  . Alcohol use: No  . Drug use: No  .  Sexual activity: Not Currently  Other Topics Concern  . Not on file  Social History Narrative  . Not on file   Social Determinants of Health   Financial Resource Strain:   . Difficulty of Paying Living Expenses:   Food Insecurity:   . Worried About Charity fundraiser in the Last Year:   . Arboriculturist in the Last Year:   Transportation Needs:   . Film/video editor (Medical):   Marland Kitchen Lack of Transportation (Non-Medical):   Physical Activity:   . Days of Exercise per Week:   . Minutes of Exercise per Session:   Stress:   . Feeling of Stress :   Social Connections:   . Frequency of Communication with Friends and Family:   . Frequency of Social Gatherings with Friends and Family:   . Attends Religious Services:   . Active Member of Clubs or Organizations:   . Attends Archivist Meetings:   Marland Kitchen Marital Status:     Allergies:  Allergies  Allergen Reactions  . Shellfish Allergy Anaphylaxis  . Neurontin [Gabapentin] Other (See Comments)    Unable to talk when takes medication  . Chlorhexidine Gluconate Rash and Other (See  Comments)    burning  . Codeine Itching     itching  . Haldol [Haloperidol Lactate] Other (See Comments)    Muscle spasms  . Hydromorphone Hcl Nausea And Vomiting  . Latex Rash    adhesives  . Propoxyphene N-Acetaminophen Nausea And Vomiting  . Vicodin [Hydrocodone-Acetaminophen] Itching    Metabolic Disorder Labs: No results found for: HGBA1C, MPG No results found for: PROLACTIN No results found for: CHOL, TRIG, HDL, CHOLHDL, VLDL, LDLCALC Lab Results  Component Value Date   TSH 2.25 01/28/2016    Therapeutic Level Labs: No results found for: LITHIUM No results found for: VALPROATE No components found for:  CBMZ  Current Medications: Current Outpatient Medications  Medication Sig Dispense Refill  . ALPRAZolam (XANAX) 1 MG tablet Take 1 tablet (1 mg total) by mouth 4 (four) times daily. 120 tablet 2  . Ascorbic Acid (VITA-C PO) Take by mouth daily.    . Cholecalciferol (VITAMIN D3 PO) Take by mouth daily.    . cholestyramine (QUESTRAN) 4 GM/DOSE powder TAKE 1 SCOOPFUL OF 4 GRAMS AND MIX WITH LIQUID OR APPLESAUCE AS DIRECTED ON PACKAGING AND TAKE BY MOUTH DAILY - DO NOT TAKE WITHIN 2 HRS OF OTHER MEDS 378 g 11  . FLUoxetine (PROZAC) 40 MG capsule Take 1 capsule (40 mg total) by mouth 2 (two) times daily. 180 capsule 2  . methocarbamol (ROBAXIN) 500 MG tablet Take 1 tablet (500 mg total) by mouth every 6 (six) hours as needed for muscle spasms. 30 tablet 0  . Multiple Vitamin (MULTIVITAMIN WITH MINERALS) TABS Take 2 tablets by mouth every evening.     . Multiple Vitamins-Minerals (HAIR/SKIN/NAILS) CAPS Take 2 tablets by mouth daily.    . nitroGLYCERIN (NITROSTAT) 0.4 MG SL tablet Place 0.4 mg under the tongue every 5 (five) minutes as needed for chest pain.    Marland Kitchen OLANZapine (ZYPREXA) 20 MG tablet Take 1 tablet (20 mg total) by mouth at bedtime. 90 tablet 2  . oxybutynin (DITROPAN XL) 15 MG 24 hr tablet Take 15 mg by mouth daily.  11  . rOPINIRole (REQUIP) 3 MG tablet TAKE 1  TABLET (3mg ) BY MOUTH 3 TIMES DAILY.  11  . traZODone (DESYREL) 100 MG tablet Take 1 tablet (100 mg total) by mouth  at bedtime. 90 tablet 2   No current facility-administered medications for this visit.     Musculoskeletal: Strength & Muscle Tone: within normal limits Gait & Station: normal Patient leans: N/A  Psychiatric Specialty Exam: Review of Systems  Constitutional: Positive for fatigue.  Musculoskeletal: Positive for arthralgias, back pain and joint swelling.  All other systems reviewed and are negative.   There were no vitals taken for this visit.There is no height or weight on file to calculate BMI.  General Appearance: NA  Eye Contact:  NA  Speech:  Clear and Coherent  Volume:  Normal  Mood:  Euthymic  Affect:  NA  Thought Process:  Goal Directed  Orientation:  Full (Time, Place, and Person)  Thought Content: WDL   Suicidal Thoughts:  No  Homicidal Thoughts:  No  Memory:  Immediate;   Good Recent;   Good Remote;   Good  Judgement:  Good  Insight:  Fair  Psychomotor Activity:  Decreased  Concentration:  Concentration: Good and Attention Span: Good  Recall:  Good  Fund of Knowledge: Good  Language: Good  Akathisia:  No  Handed:  Right  AIMS (if indicated): not done  Assets:  Communication Skills Desire for Improvement Resilience Social Support Talents/Skills  ADL's:  Intact  Cognition: WNL  Sleep:  Good   Screenings:   Assessment and Plan: This patient is a 74 year old female with a history of bipolar disorder. She continues to do well on her current regimen. She will try to reach out more to family members because she feels lonely and we will also try to contact her primary doctor or orthopedic doctor regarding her back and knee pain. She will continue Xanax 1 mg four times daily for anxiety, Prozac 40 mg twice daily for depression, olanzapine 20 mg at bedtime for mood stabilization and trazodone 100 mg at bedtime for sleep. She will return to see me in  3 months   Levonne Spiller, MD 12/20/2019, 2:58 PM

## 2019-12-29 ENCOUNTER — Other Ambulatory Visit (HOSPITAL_COMMUNITY): Payer: Self-pay | Admitting: Psychiatry

## 2020-03-27 ENCOUNTER — Other Ambulatory Visit: Payer: Self-pay

## 2020-03-27 ENCOUNTER — Telehealth (INDEPENDENT_AMBULATORY_CARE_PROVIDER_SITE_OTHER): Payer: Medicare HMO | Admitting: Psychiatry

## 2020-03-27 ENCOUNTER — Encounter (HOSPITAL_COMMUNITY): Payer: Self-pay | Admitting: Psychiatry

## 2020-03-27 DIAGNOSIS — F3130 Bipolar disorder, current episode depressed, mild or moderate severity, unspecified: Secondary | ICD-10-CM

## 2020-03-27 MED ORDER — FLUOXETINE HCL 40 MG PO CAPS: 40.0000 mg | ORAL_CAPSULE | Freq: Two times a day (BID) | ORAL | 2 refills | Status: DC

## 2020-03-27 MED ORDER — OLANZAPINE 20 MG PO TABS: 20.0000 mg | ORAL_TABLET | Freq: Every day | ORAL | 2 refills | Status: DC

## 2020-03-27 MED ORDER — TRAZODONE HCL 100 MG PO TABS: 100.0000 mg | ORAL_TABLET | Freq: Every day | ORAL | 2 refills | Status: DC

## 2020-03-27 MED ORDER — ALPRAZOLAM 1 MG PO TABS: 1.0000 mg | ORAL_TABLET | Freq: Four times a day (QID) | ORAL | 2 refills | Status: DC

## 2020-03-27 NOTE — Progress Notes (Signed)
Virtual Visit via Telephone Note  I connected with Victoria Mccarthy on 03/27/20 at  1:00 PM EDT by telephone and verified that I am speaking with the correct person using two identifiers.   I discussed the limitations, risks, security and privacy concerns of performing an evaluation and management service by telephone and the availability of in person appointments. I also discussed with the patient that there may be a patient responsible charge related to this service. The patient expressed understanding and agreed to proceed.    I discussed the assessment and treatment plan with the patient. The patient was provided an opportunity to ask questions and all were answered. The patient agreed with the plan and demonstrated an understanding of the instructions.   The patient was advised to call back or seek an in-person evaluation if the symptoms worsen or if the condition fails to improve as anticipated.  I provided 15 minutes of non-face-to-face time during this encounter Location: Patient home, provider Home  Levonne Spiller, MD  [facility] MD/PA/NP OP Progress Note  03/27/2020 1:16 PM Victoria Mccarthy  MRN:  [id number]  Chief Complaint:  Chief Complaint    Manic Behavior; Depression; Follow-up     HPI: This patient is a 74 year old white female who lives alone in [place].  She has 1 son and 1 daughter as well as several grandchildren.  She is a retired Therapist, art.  The patient returns for follow-up after 3 months regarding bipolar disorder and anxiety.  She states that right now she is actually doing quite well.  Her car battery is not working in the hybrid vehicle but she is getting this fixed so she will be able to get out more.  She did get her coronavirus vaccine.  She states that other than back pain she is not having any medical issues.  She is sleeping well.  She denies manic symptoms such as racing thoughts or agitation and she also denies significant depression or suicidal ideation.  She  feels that her medications are working well and she denies significant anxiety or panic attacks Visit Diagnosis:    ICD-10-CM   1. Bipolar I disorder, most recent episode depressed ([place])  F31.30     Past Psychiatric History: Hospitalized twice in the 1980s, since then outpatient treatment for bipolar disorder  Past Medical History:  Past Medical History:  Diagnosis Date  . Anemia    hx  . Anxiety   . Arthritis    chronic back pain  . Bipolar disorder ([place])   . Cancer [facility]) Jan 2013   kidney cancer, left, s/p partial nephrectomy  . Depression   . Diarrhea    incontinent stools  . Diverticulitis    treated at least 5 times in past, hospitalized twice  . Heart murmur   . History of hiatal hernia    s/p  . MVP (mitral valve prolapse)   . Neuropathy   . Nocturia   . Osteoarthritis   . Osteoporosis   . PONV (postoperative nausea and vomiting)   . Psychotic affective disorder ([place])    "psychotic delusions" "I cut myself"   . Restless leg syndrome   . Rheumatic fever   . Urinary frequency     Past Surgical History:  Procedure Laterality Date  . ABDOMINAL HYSTERECTOMY    . ANTERIOR CERVICAL DECOMP/DISCECTOMY FUSION N/A 09/28/2017   Procedure: ANTERIOR CERVICAL DECOMPRESSION/DISCECTOMY FUSION CERVICAL FOUR- CERVICAL FIVE, CERVICAL FIVE- CERVICAL SIX, CERVICAL SIX- CERVICAL SEVEN;  Surgeon: Eustace Moore, MD;  Location: [facility];  Service: Neurosurgery;  Laterality: N/A;  ANTERIOR CERVICAL DECOMPRESSION/DISCECTOMY FUSION CERVICAL FOUR- CERVICAL FIVE, CERVICAL FIVE- CERVICAL SIX, CERVICAL SIX- CERVICAL SEVEN  . BACK SURGERY    . BACK SURGERY     2 plates, 8 screws in back  . carpell tunnell     rt wrist  x2  . CATARACT EXTRACTION W/PHACO Left 06/14/2016   Procedure: CATARACT EXTRACTION PHACO AND INTRAOCULAR LENS PLACEMENT; CDE:  3.92;  Surgeon: Williams Che, MD;  [facility];  Service: Ophthalmology;  Laterality: Left;  . CHOLECYSTECTOMY    . COLONOSCOPY  5/02    pancolonic deverticula, internal hemorrhoids  . COLONOSCOPY  1/11   single external hemorrhoidal tag and anal papilla otherwise normal rectum, pancolonic diverticula, s/p sigmoid biopsy and stool sampling all unremarkable  . ESOPHAGOGASTRODUODENOSCOPY  04/06/2010   intact Nissen fundoplication S/P dilation, somewhat baggy atonic appearing esophagus, 58-F dilation  . ESOPHAGOGASTRODUODENOSCOPY  1/11   nomral esophagus s/p 54-F Maloney dilation, normal/intact Nissen fundoplication, diffuse patchy erythema and erosions likely NSAID/ASA effect with benign biopsy  . EYE SURGERY     right eye cataract  . kidney surgery for cancer    . MAXIMUM ACCESS (MAS)POSTERIOR LUMBAR INTERBODY FUSION (PLIF) 1 LEVEL N/A 07/25/2014   Procedure: FOR MAXIMUM ACCESS (MAS) POSTERIOR LUMBAR INTERBODY FUSION (PLIF) Lumbar two/three, Removal of hardware Lumbar three/four;  Surgeon: Eustace Moore, MD;  Location: [place] [facility];  Service: Neurosurgery;  Laterality: N/A;  . MAXIMUM ACCESS (MAS)POSTERIOR LUMBAR INTERBODY FUSION (PLIF) 2 LEVEL N/A 09/11/2015   Procedure: LUMBAR FOUR-FIVE LUMBAR FIVE SACRAL ONE  MAXIMUM ACCESS SURGERY, POSTERIOR LUMBAR INTERBODY FUSION , Removal of LUMBAR TWO TO LUMBAR FOUR  HARDWARE;  Surgeon: Eustace Moore, MD;  Location: [place] [facility];  Service: Neurosurgery;  Laterality: N/A;  . NISSEN FUNDOPLICATION    . REVERSE SHOULDER ARTHROPLASTY Right 05/28/2016   Procedure: RIGHT REVERSE SHOULDER ARTHROPLASTY;  Surgeon: Netta Cedars, MD;  Location: [place];  Service: Orthopedics;  Laterality: Right;  . ROBOT ASSISTED LAPAROSCOPIC NEPHRECTOMY  08/23/2011   Procedure: ROBOTIC ASSISTED LAPAROSCOPIC NEPHRECTOMY;  Surgeon: Dutch Gray, MD;  [facility];  Service: Urology;  Laterality: Left;  Left Robotic Assisted  Laparoscopic Partial Nephrectomy   . ROTATOR CUFF REPAIR     right side X 2  . TONSILLECTOMY    . TOTAL SHOULDER REVISION Right 08/19/2017   Procedure: RIGHT SHOULDER POLYETHYLENE EXCHANGE;  Surgeon:  Netta Cedars, MD;  Location: [place] [facility];  Service: Orthopedics;  Laterality: Right;    Family Psychiatric History: See below  Family History:  Family History  Problem Relation Age of Onset  . Bipolar disorder Daughter   . Colon cancer Neg Hx     Social History:  Social History   Socioeconomic History  . Marital status: Divorced    Spouse name: Not on file  . Number of children: Not on file  . Years of education: Not on file  . Highest education level: Not on file  Occupational History  . Not on file  Tobacco Use  . Smoking status: Never Smoker  . Smokeless tobacco: Never Used  Substance and Sexual Activity  . Alcohol use: No  . Drug use: No  . Sexual activity: Not Currently  Other Topics Concern  . Not on file  Social History Narrative  . Not on file   Social Determinants of Health   Financial Resource Strain:   . Difficulty of Paying Living Expenses:   Food Insecurity:   .  Worried About Charity fundraiser in the Last Year:   . Arboriculturist in the Last Year:   Transportation Needs:   . Film/video editor (Medical):   Marland Kitchen Lack of Transportation (Non-Medical):   Physical Activity:   . Days of Exercise per Week:   . Minutes of Exercise per Session:   Stress:   . Feeling of Stress :   Social Connections:   . Frequency of Communication with Friends and Family:   . Frequency of Social Gatherings with Friends and Family:   . Attends Religious Services:   . Active Member of Clubs or Organizations:   . Attends Archivist Meetings:   Marland Kitchen Marital Status:     Allergies:  Allergies  Allergen Reactions  . Shellfish Allergy Anaphylaxis  . Neurontin [Gabapentin] Other (See Comments)    Unable to talk when takes medication  . Chlorhexidine Gluconate Rash and Other (See Comments)    burning  . Codeine Itching     itching  . Haldol [Haloperidol Lactate] Other (See Comments)    Muscle spasms  . Hydromorphone Hcl Nausea And Vomiting  . Latex Rash     adhesives  . Propoxyphene N-Acetaminophen Nausea And Vomiting  . Vicodin [Hydrocodone-Acetaminophen] Itching    Metabolic Disorder Labs: No results found for: HGBA1C, MPG No results found for: PROLACTIN No results found for: CHOL, TRIG, HDL, CHOLHDL, VLDL, LDLCALC Lab Results  Component Value Date   TSH 2.25 01/28/2016    Therapeutic Level Labs: No results found for: LITHIUM No results found for: VALPROATE No components found for:  CBMZ  Current Medications: Current Outpatient Medications  Medication Sig Dispense Refill  . ALPRAZolam (XANAX) 1 MG tablet Take 1 tablet (1 mg total) by mouth 4 (four) times daily. 120 tablet 2  . Ascorbic Acid (VITA-C PO) Take by mouth daily.    . Cholecalciferol (VITAMIN D3 PO) Take by mouth daily.    . cholestyramine (QUESTRAN) 4 GM/DOSE powder TAKE 1 SCOOPFUL OF 4 GRAMS AND MIX WITH LIQUID OR APPLESAUCE AS DIRECTED ON PACKAGING AND TAKE BY MOUTH DAILY - DO NOT TAKE WITHIN 2 HRS OF OTHER MEDS 378 g 11  . FLUoxetine (PROZAC) 40 MG capsule Take 1 capsule (40 mg total) by mouth 2 (two) times daily. 180 capsule 2  . methocarbamol (ROBAXIN) 500 MG tablet Take 1 tablet (500 mg total) by mouth every 6 (six) hours as needed for muscle spasms. 30 tablet 0  . Multiple Vitamin (MULTIVITAMIN WITH MINERALS) TABS Take 2 tablets by mouth every evening.     . Multiple Vitamins-Minerals (HAIR/SKIN/NAILS) CAPS Take 2 tablets by mouth daily.    . nitroGLYCERIN (NITROSTAT) 0.4 MG SL tablet Place 0.4 mg under the tongue every 5 (five) minutes as needed for chest pain.    Marland Kitchen OLANZapine (ZYPREXA) 20 MG tablet Take 1 tablet (20 mg total) by mouth at bedtime. 90 tablet 2  . oxybutynin (DITROPAN XL) 15 MG 24 hr tablet Take 15 mg by mouth daily.  11  . rOPINIRole (REQUIP) 3 MG tablet TAKE 1 TABLET (3mg ) BY MOUTH 3 TIMES DAILY.  11  . traZODone (DESYREL) 100 MG tablet Take 1 tablet (100 mg total) by mouth at bedtime. 90 tablet 2   No current facility-administered medications  for this visit.     Musculoskeletal: Strength & Muscle Tone: within normal limits Gait & Station: normal Patient leans: N/A  Psychiatric Specialty Exam: Review of Systems  Musculoskeletal: Positive for back pain.  All  other systems reviewed and are negative.   There were no vitals taken for this visit.There is no height or weight on file to calculate BMI.  General Appearance: NA  Eye Contact:  NA  Speech:  Clear and Coherent  Volume:  Normal  Mood:  Euthymic  Affect:  NA  Thought Process:  Goal Directed  Orientation:  Full (Time, Place, and Person)  Thought Content: WDL   Suicidal Thoughts:  No  Homicidal Thoughts:  No  Memory:  Immediate;   Good Recent;   Good Remote;   Good  Judgement:  Good  Insight:  Fair  Psychomotor Activity:  Normal  Concentration:  Concentration: Good and Attention Span: Good  Recall:  Good  Fund of Knowledge: Good  Language: Good  Akathisia:  No  Handed:  Right  AIMS (if indicated): not done  Assets:  Communication Skills Desire for Improvement Resilience Social Support Talents/Skills  ADL's:  Intact  Cognition: WNL  Sleep:  Good   Screenings:   Assessment and Plan: This patient is a 74 year old female with a history of bipolar disorder.  She continues to do well on her current regimen.  She will continue Xanax 1 mg 4 times daily for anxiety, Prozac 40 mg twice daily for depression, olanzapine 20 mg at bedtime for mood stabilization and trazodone 100 mg at bedtime for sleep.  She will return to see me in 3 months   Levonne Spiller, MD 03/27/2020, 1:16 PM

## 2020-04-16 ENCOUNTER — Other Ambulatory Visit: Payer: Self-pay | Admitting: Gastroenterology

## 2020-05-18 ENCOUNTER — Other Ambulatory Visit (HOSPITAL_COMMUNITY): Payer: Self-pay | Admitting: Psychiatry

## 2020-05-21 ENCOUNTER — Other Ambulatory Visit: Payer: Self-pay | Admitting: Gastroenterology

## 2020-05-29 ENCOUNTER — Telehealth (HOSPITAL_COMMUNITY): Payer: Self-pay | Admitting: *Deleted

## 2020-05-29 NOTE — Telephone Encounter (Signed)
Sent 7/8 with 2 refills, she should have one left

## 2020-05-29 NOTE — Telephone Encounter (Signed)
Patient called stating she is needing her Xanax sent to [facility] in [place], Per pt she only have 7 tabs left.

## 2020-05-30 ENCOUNTER — Other Ambulatory Visit (HOSPITAL_COMMUNITY): Payer: Self-pay | Admitting: Psychiatry

## 2020-05-30 MED ORDER — ALPRAZOLAM 1 MG PO TABS: 1.0000 mg | ORAL_TABLET | Freq: Four times a day (QID) | ORAL | 2 refills | Status: DC

## 2020-05-30 NOTE — Telephone Encounter (Signed)
sent 

## 2020-05-30 NOTE — Telephone Encounter (Signed)
noted 

## 2020-05-30 NOTE — Telephone Encounter (Signed)
Patient stated that she tried to get her medication from where provider originally sent the script to to get them to transfer it to her new pharmacy [facility] [place] but they stated they can not transfer it. They told patient to call her provider that prescribed the med for her to [facility] a new script to [facility] [place] so that's why she's calling

## 2020-06-26 ENCOUNTER — Telehealth (HOSPITAL_COMMUNITY): Payer: Medicare HMO | Admitting: Psychiatry

## 2020-06-26 ENCOUNTER — Other Ambulatory Visit: Payer: Self-pay

## 2020-07-09 ENCOUNTER — Encounter (HOSPITAL_COMMUNITY): Payer: Self-pay | Admitting: Psychiatry

## 2020-07-09 ENCOUNTER — Other Ambulatory Visit: Payer: Self-pay

## 2020-07-09 ENCOUNTER — Telehealth (INDEPENDENT_AMBULATORY_CARE_PROVIDER_SITE_OTHER): Payer: Medicare Other | Admitting: Psychiatry

## 2020-07-09 DIAGNOSIS — F3130 Bipolar disorder, current episode depressed, mild or moderate severity, unspecified: Secondary | ICD-10-CM

## 2020-07-09 MED ORDER — FLUOXETINE HCL 40 MG PO CAPS: 40.0000 mg | ORAL_CAPSULE | Freq: Two times a day (BID) | ORAL | 2 refills | Status: DC

## 2020-07-09 MED ORDER — ALPRAZOLAM 1 MG PO TABS: 1.0000 mg | ORAL_TABLET | Freq: Four times a day (QID) | ORAL | 2 refills | Status: DC

## 2020-07-09 MED ORDER — TRAZODONE HCL 100 MG PO TABS: 100.0000 mg | ORAL_TABLET | Freq: Every day | ORAL | 1 refills | Status: DC

## 2020-07-09 MED ORDER — OLANZAPINE 20 MG PO TABS: 20.0000 mg | ORAL_TABLET | Freq: Every day | ORAL | 2 refills | Status: DC

## 2020-07-09 NOTE — Progress Notes (Signed)
Virtual Visit via Telephone Note  I connected with Victoria Mccarthy on 07/09/20 at 10:40 AM EDT by telephone and verified that I am speaking with the correct person using two identifiers.  Location: Patient: home Provider: home   I discussed the limitations, risks, security and privacy concerns of performing an evaluation and management service by telephone and the availability of in person appointments. I also discussed with the patient that there may be a patient responsible charge related to this service. The patient expressed understanding and agreed to proceed.    I discussed the assessment and treatment plan with the patient. The patient was provided an opportunity to ask questions and all were answered. The patient agreed with the plan and demonstrated an understanding of the instructions.   The patient was advised to call back or seek an in-person evaluation if the symptoms worsen or if the condition fails to improve as anticipated.  I provided 15 minutes of non-face-to-face time during this encounter.   Levonne Spiller, MD  [facility] MD/PA/NP OP Progress Note  07/09/2020 11:02 AM Victoria Mccarthy  MRN:  [id number]  Chief Complaint:  Chief Complaint    Depression; Anxiety; Manic Behavior; Follow-up     HPI: This patient is a 74 year old white female who lives alone in [place].  She has 1 son and 1 daughter as well as several grandchildren.  She is a retired Therapist, art.  The patient returns after 4 months regarding bipolar disorder and anxiety.  She is continuing to do quite well.  She is still spending most of her time at home due to the coronavirus although she has been vaccinated and she wears her mask outside.  Her mood has been good and she denies significant symptoms of depression or manic symptoms such as racing thoughts or agitation.  She feels that her medications are still working well.  She still very upset with her son who refuses to speak with her.  She states that he drinks  quite heavily and is a difficult person to get along with.  She realizes there is not much more she can do about the situation. Visit Diagnosis:    ICD-10-CM   1. Bipolar I disorder, most recent episode depressed ([place])  F31.30     Past Psychiatric History: Hospitalized twice in the 1980s, since then outpatient treatment for bipolar disorder  Past Medical History:  Past Medical History:  Diagnosis Date  . Anemia    hx  . Anxiety   . Arthritis    chronic back pain  . Bipolar disorder ([place])   . Cancer [facility]) Jan 2013   kidney cancer, left, s/p partial nephrectomy  . Depression   . Diarrhea    incontinent stools  . Diverticulitis    treated at least 5 times in past, hospitalized twice  . Heart murmur   . History of hiatal hernia    s/p  . MVP (mitral valve prolapse)   . Neuropathy   . Nocturia   . Osteoarthritis   . Osteoporosis   . PONV (postoperative nausea and vomiting)   . Psychotic affective disorder ([place])    "psychotic delusions" "I cut myself"   . Restless leg syndrome   . Rheumatic fever   . Urinary frequency     Past Surgical History:  Procedure Laterality Date  . ABDOMINAL HYSTERECTOMY    . ANTERIOR CERVICAL DECOMP/DISCECTOMY FUSION N/A 09/28/2017   Procedure: ANTERIOR CERVICAL DECOMPRESSION/DISCECTOMY FUSION CERVICAL FOUR- CERVICAL FIVE, CERVICAL FIVE- CERVICAL SIX, CERVICAL SIX-  CERVICAL SEVEN;  Surgeon: Eustace Moore, MD;  Location: [place];  Service: Neurosurgery;  Laterality: N/A;  ANTERIOR CERVICAL DECOMPRESSION/DISCECTOMY FUSION CERVICAL FOUR- CERVICAL FIVE, CERVICAL FIVE- CERVICAL SIX, CERVICAL SIX- CERVICAL SEVEN  . BACK SURGERY    . BACK SURGERY     2 plates, 8 screws in back  . carpell tunnell     rt wrist  x2  . CATARACT EXTRACTION W/PHACO Left 06/14/2016   Procedure: CATARACT EXTRACTION PHACO AND INTRAOCULAR LENS PLACEMENT; CDE:  3.92;  Surgeon: Williams Che, MD;  [facility];  Service: Ophthalmology;  Laterality: Left;  . CHOLECYSTECTOMY     . COLONOSCOPY  5/02   pancolonic deverticula, internal hemorrhoids  . COLONOSCOPY  1/11   single external hemorrhoidal tag and anal papilla otherwise normal rectum, pancolonic diverticula, s/p sigmoid biopsy and stool sampling all unremarkable  . ESOPHAGOGASTRODUODENOSCOPY  04/06/2010   intact Nissen fundoplication S/P dilation, somewhat baggy atonic appearing esophagus, 58-F dilation  . ESOPHAGOGASTRODUODENOSCOPY  1/11   nomral esophagus s/p 54-F Maloney dilation, normal/intact Nissen fundoplication, diffuse patchy erythema and erosions likely NSAID/ASA effect with benign biopsy  . EYE SURGERY     right eye cataract  . kidney surgery for cancer    . MAXIMUM ACCESS (MAS)POSTERIOR LUMBAR INTERBODY FUSION (PLIF) 1 LEVEL N/A 07/25/2014   Procedure: FOR MAXIMUM ACCESS (MAS) POSTERIOR LUMBAR INTERBODY FUSION (PLIF) Lumbar two/three, Removal of hardware Lumbar three/four;  Surgeon: Eustace Moore, MD;  Location: [place] [facility];  Service: Neurosurgery;  Laterality: N/A;  . MAXIMUM ACCESS (MAS)POSTERIOR LUMBAR INTERBODY FUSION (PLIF) 2 LEVEL N/A 09/11/2015   Procedure: LUMBAR FOUR-FIVE LUMBAR FIVE SACRAL ONE  MAXIMUM ACCESS SURGERY, POSTERIOR LUMBAR INTERBODY FUSION , Removal of LUMBAR TWO TO LUMBAR FOUR  HARDWARE;  Surgeon: Eustace Moore, MD;  Location: [place] [facility];  Service: Neurosurgery;  Laterality: N/A;  . NISSEN FUNDOPLICATION    . REVERSE SHOULDER ARTHROPLASTY Right 05/28/2016   Procedure: RIGHT REVERSE SHOULDER ARTHROPLASTY;  Surgeon: Netta Cedars, MD;  Location: [place];  Service: Orthopedics;  Laterality: Right;  . ROBOT ASSISTED LAPAROSCOPIC NEPHRECTOMY  08/23/2011   Procedure: ROBOTIC ASSISTED LAPAROSCOPIC NEPHRECTOMY;  Surgeon: Dutch Gray, MD;  [facility];  Service: Urology;  Laterality: Left;  Left Robotic Assisted  Laparoscopic Partial Nephrectomy   . ROTATOR CUFF REPAIR     right side X 2  . TONSILLECTOMY    . TOTAL SHOULDER REVISION Right 08/19/2017   Procedure: RIGHT SHOULDER  POLYETHYLENE EXCHANGE;  Surgeon: Netta Cedars, MD;  Location: [place];  Service: Orthopedics;  Laterality: Right;    Family Psychiatric History: see below  Family History:  Family History  Problem Relation Age of Onset  . Bipolar disorder Daughter   . Colon cancer Neg Hx     Social History:  Social History   Socioeconomic History  . Marital status: Divorced    Spouse name: Not on file  . Number of children: Not on file  . Years of education: Not on file  . Highest education level: Not on file  Occupational History  . Not on file  Tobacco Use  . Smoking status: Never Smoker  . Smokeless tobacco: Never Used  Substance and Sexual Activity  . Alcohol use: No  . Drug use: No  . Sexual activity: Not Currently  Other Topics Concern  . Not on file  Social History Narrative  . Not on file   Social Determinants of Health   Financial Resource Strain:   . Difficulty of  Paying Living Expenses: Not on file  Food Insecurity:   . Worried About Charity fundraiser in the Last Year: Not on file  . Ran Out of Food in the Last Year: Not on file  Transportation Needs:   . Lack of Transportation (Medical): Not on file  . Lack of Transportation (Non-Medical): Not on file  Physical Activity:   . Days of Exercise per Week: Not on file  . Minutes of Exercise per Session: Not on file  Stress:   . Feeling of Stress : Not on file  Social Connections:   . Frequency of Communication with Friends and Family: Not on file  . Frequency of Social Gatherings with Friends and Family: Not on file  . Attends Religious Services: Not on file  . Active Member of Clubs or Organizations: Not on file  . Attends Archivist Meetings: Not on file  . Marital Status: Not on file    Allergies:  Allergies  Allergen Reactions  . Shellfish Allergy Anaphylaxis  . Neurontin [Gabapentin] Other (See Comments)    Unable to talk when takes medication  . Chlorhexidine Gluconate Rash and Other (See  Comments)    burning  . Codeine Itching     itching  . Haldol [Haloperidol Lactate] Other (See Comments)    Muscle spasms  . Hydromorphone Hcl Nausea And Vomiting  . Latex Rash    adhesives  . Propoxyphene N-Acetaminophen Nausea And Vomiting  . Vicodin [Hydrocodone-Acetaminophen] Itching    Metabolic Disorder Labs: No results found for: HGBA1C, MPG No results found for: PROLACTIN No results found for: CHOL, TRIG, HDL, CHOLHDL, VLDL, LDLCALC Lab Results  Component Value Date   TSH 2.25 01/28/2016    Therapeutic Level Labs: No results found for: LITHIUM No results found for: VALPROATE No components found for:  CBMZ  Current Medications: Current Outpatient Medications  Medication Sig Dispense Refill  . ALPRAZolam (XANAX) 1 MG tablet Take 1 tablet (1 mg total) by mouth 4 (four) times daily. 120 tablet 2  . Ascorbic Acid (VITA-C PO) Take by mouth daily.    . Cholecalciferol (VITAMIN D3 PO) Take by mouth daily.    . cholestyramine (QUESTRAN) 4 GM/DOSE powder TAKE 1 SCOOPFUL OF 4 GRAMS AND MIX WITH LIQUID OR APPLESAUCE AS DIRECTED ON PACKAGING AND TAKE BY MOUTH DAILY - DO NOT TAKE WITHIN 2 HRS OF OTHER MEDS 378 g 1  . FLUoxetine (PROZAC) 40 MG capsule Take 1 capsule (40 mg total) by mouth 2 (two) times daily. 180 capsule 2  . methocarbamol (ROBAXIN) 500 MG tablet Take 1 tablet (500 mg total) by mouth every 6 (six) hours as needed for muscle spasms. 30 tablet 0  . Multiple Vitamin (MULTIVITAMIN WITH MINERALS) TABS Take 2 tablets by mouth every evening.     . Multiple Vitamins-Minerals (HAIR/SKIN/NAILS) CAPS Take 2 tablets by mouth daily.    . nitroGLYCERIN (NITROSTAT) 0.4 MG SL tablet Place 0.4 mg under the tongue every 5 (five) minutes as needed for chest pain.    Marland Kitchen OLANZapine (ZYPREXA) 20 MG tablet Take 1 tablet (20 mg total) by mouth at bedtime. 90 tablet 2  . oxybutynin (DITROPAN XL) 15 MG 24 hr tablet Take 15 mg by mouth daily.  11  . rOPINIRole (REQUIP) 3 MG tablet TAKE 1  TABLET (3mg ) BY MOUTH 3 TIMES DAILY.  11  . traZODone (DESYREL) 100 MG tablet Take 1 tablet (100 mg total) by mouth at bedtime. 90 tablet 1   No current facility-administered  medications for this visit.     Musculoskeletal: Strength & Muscle Tone: within normal limits Gait & Station: normal Patient leans: N/A  Psychiatric Specialty Exam: Review of Systems  All other systems reviewed and are negative.   There were no vitals taken for this visit.There is no height or weight on file to calculate BMI.  General Appearance: NA  Eye Contact:  NA  Speech:  Clear and Coherent  Volume:  Normal  Mood:  Euthymic  Affect:  NA  Thought Process:  Goal Directed  Orientation:  Full (Time, Place, and Person)  Thought Content: WDL   Suicidal Thoughts:  No  Homicidal Thoughts:  No  Memory:  Immediate;   Good Recent;   Good Remote;   Fair  Judgement:  Good  Insight:  Good  Psychomotor Activity:  Normal  Concentration:  Concentration: Good and Attention Span: Good  Recall:  Good  Fund of Knowledge: Good  Language: Good  Akathisia:  No  Handed:  Right  AIMS (if indicated): not done  Assets:  Communication Skills Desire for Improvement Physical Health Resilience Social Support Talents/Skills  ADL's:  Intact  Cognition: WNL  Sleep:  Good   Screenings:   Assessment and Plan: This patient is a 74 year old female with a history of bipolar disorder.  She continues to do well on her current regimen-Xanax 1 mg 4 times daily for anxiety, Prozac 40 mg twice daily for depression, olanzapine 20 mg at bedtime for mood stabilization and trazodone 100 mg at bedtime for sleep.  She will return to see me in 4 months   Levonne Spiller, MD 07/09/2020, 11:02 AM

## 2020-08-07 ENCOUNTER — Other Ambulatory Visit: Payer: Self-pay | Admitting: Neurological Surgery

## 2020-08-07 ENCOUNTER — Telehealth (HOSPITAL_COMMUNITY): Payer: Self-pay | Admitting: Psychiatry

## 2020-08-07 DIAGNOSIS — M546 Pain in thoracic spine: Secondary | ICD-10-CM

## 2020-08-07 DIAGNOSIS — M542 Cervicalgia: Secondary | ICD-10-CM

## 2020-08-07 NOTE — Telephone Encounter (Signed)
Called patient to schedule f/u appt, left vm

## 2020-08-08 ENCOUNTER — Telehealth: Payer: Self-pay

## 2020-08-08 NOTE — Telephone Encounter (Signed)
Phone call to patient to verify medication list and allergies for myelogram procedure. Pt instructed to hold Prozac, Trazodone, and Zyprexa for 48hrs prior to myelogram appointment time and 24 hours after appointment. Pt reports she has a Freight forwarder". Pt advised to either turn it off prior to arrival for myelogram or bring the remote with her the day of. Pt verbalized understanding. Pt also instructed to have a driver the day of the procedure, the procedure would take around 2 hours, and discharge instructions discussed. Pt verbalized understanding.

## 2020-08-20 ENCOUNTER — Ambulatory Visit
Admission: RE | Admit: 2020-08-20 | Discharge: 2020-08-20 | Disposition: A | Discharge status: Home / Self Care | Payer: Medicare Other | Source: Ambulatory Visit | Attending: Neurological Surgery | Admitting: Neurological Surgery

## 2020-08-20 VITALS — BP 144/86 | HR 86

## 2020-08-20 DIAGNOSIS — M542 Cervicalgia: Secondary | ICD-10-CM

## 2020-08-20 DIAGNOSIS — M546 Pain in thoracic spine: Secondary | ICD-10-CM

## 2020-08-20 DIAGNOSIS — M4322 Fusion of spine, cervical region: Secondary | ICD-10-CM

## 2020-08-20 MED ORDER — DIAZEPAM 5 MG PO TABS
5.0000 mg | ORAL_TABLET | Freq: Once | ORAL | Status: AC
  Administered 2020-08-20: 5 mg via ORAL

## 2020-08-20 MED ORDER — ONDANSETRON 4 MG PO TBDP
4.0000 mg | ORAL_TABLET | Freq: Once | ORAL | Status: AC
  Administered 2020-08-20: 4 mg via ORAL

## 2020-08-20 NOTE — Discharge Instr - Other Orders (Addendum)
1405: pt c/o nausea. Dr. Jeralyn Ruths notified. See mar 6151: pt repots her nausea has subsided.

## 2020-08-20 NOTE — Discharge Instructions (Signed)
Myelogram Discharge Instructions  1. Go home and rest quietly for the next 24 hours.  It is important to lie flat for the next 24 hours.  Get up only to go to the restroom.  You may lie in the bed or on a couch on your back, your stomach, your left side or your right side.  You may have one pillow under your head.  You may have pillows between your knees while you are on your side or under your knees while you are on your back.  2. DO NOT drive today.  Recline the seat as far back as it will go, while still wearing your seat belt, on the way home.  3. You may get up to go to the bathroom as needed.  You may sit up for 10 minutes to eat.  You may resume your normal diet and medications unless otherwise indicated.  Drink lots of extra fluids today and tomorrow.  4. The incidence of headache, nausea, or vomiting is about 5% (one in 20 patients).  If you develop a headache, lie flat and drink plenty of fluids until the headache goes away.  Caffeinated beverages may be helpful.  If you develop severe nausea and vomiting or a headache that does not go away with flat bed rest, call [phone].  5. You may resume normal activities after your 24 hours of bed rest is over; however, do not exert yourself strongly or do any heavy lifting tomorrow. If when you get up you have a headache when standing, go back to bed and force fluids for another 24 hours.  6. Call your physician for a follow-up appointment.  The results of your myelogram will be sent directly to your physician by the following day.  7. If you have any questions or if complications develop after you arrive home, please call [phone].  Discharge instructions have been explained to the patient.  The patient, or the person responsible for the patient, fully understands these instructions  YOU MAY RESUME YOUR FLUOXETINE (PROZAC), OLANZAPINE (ZYPREXA), AND TRAZODONE TOMORROW 08/21/20 AT 1 PM

## 2020-08-20 NOTE — Progress Notes (Addendum)
Patient states she took Trazodone and Zyprexa at bedtime 11/29 and Prozac 11/30 in the morning despite being instructed to hold these medications for 48 hours before, and 24 hours after, the myelogram.  She states she feels awful, like she's crawling out of her skin.  Dr. Jeralyn Ruths made aware of this and agrees to proceed with myelogram today.  Also, patient has turned off her spinal cord stimulator for the CT scan portion of today's imaging.

## 2020-09-18 ENCOUNTER — Other Ambulatory Visit: Payer: Self-pay | Admitting: Gastroenterology

## 2020-09-29 ENCOUNTER — Ambulatory Visit (HOSPITAL_COMMUNITY): Payer: Medicare Other | Attending: Student | Admitting: Physical Therapy

## 2020-09-29 ENCOUNTER — Other Ambulatory Visit: Payer: Self-pay

## 2020-09-29 ENCOUNTER — Encounter (HOSPITAL_COMMUNITY): Payer: Self-pay | Admitting: Physical Therapy

## 2020-09-29 DIAGNOSIS — M546 Pain in thoracic spine: Secondary | ICD-10-CM | POA: Diagnosis present

## 2020-09-29 DIAGNOSIS — M6281 Muscle weakness (generalized): Secondary | ICD-10-CM | POA: Diagnosis present

## 2020-09-29 NOTE — Therapy (Signed)
[facility] [address] [place], [place], [address] Phone: [phone]   Fax:  [phone]  Physical Therapy Evaluation  Patient Details  Name: Consuello Bossieramela C Pendergrass MRN: [id number] Date of Birth: 04/19/1946 Referring Provider (PT): Tia Alertavid S Jones MD   Encounter Date: 09/29/2020   PT End of Session - 09/29/20 1532    Visit Number 1    Number of Visits 12    Date for PT Re-Evaluation 11/10/20    Authorization Type [employer]HC Medicare primary, Medicaid Secondary no VL, no auth    Progress Note Due on Visit 10    PT Start Time 1530    PT Stop Time 1607    PT Time Calculation (min) 37 min    Activity Tolerance Patient tolerated treatment well    Behavior [facility] for tasks assessed/performed           Past Medical History:  Diagnosis Date  . Anemia    hx  . Anxiety   . Arthritis    chronic back pain  . Bipolar disorder (HCC)   . [facility](HCC) Jan 2013   kidney cancer, left, s/p partial nephrectomy  . Depression   . Diarrhea    incontinent stools  . Diverticulitis    treated at least 5 times in past, hospitalized twice  . Heart murmur   . History of hiatal hernia    s/p  . MVP (mitral valve prolapse)   . Neuropathy   . Nocturia   . Osteoarthritis   . Osteoporosis   . PONV (postoperative nausea and vomiting)   . Psychotic affective disorder (HCC)    "psychotic delusions" "I cut myself"   . Restless leg syndrome   . Rheumatic fever   . Urinary frequency     Past Surgical History:  Procedure Laterality Date  . ABDOMINAL HYSTERECTOMY    . ANTERIOR CERVICAL DECOMP/DISCECTOMY FUSION N/A 09/28/2017   Procedure: ANTERIOR CERVICAL DECOMPRESSION/DISCECTOMY FUSION CERVICAL FOUR- CERVICAL FIVE, CERVICAL FIVE- CERVICAL SIX, CERVICAL SIX- CERVICAL SEVEN;  Surgeon: Tia AlertJones, David S, MD;  [facility];  Service: Neurosurgery;  Laterality: N/A;  ANTERIOR CERVICAL DECOMPRESSION/DISCECTOMY FUSION CERVICAL FOUR- CERVICAL FIVE, CERVICAL  FIVE- CERVICAL SIX, CERVICAL SIX- CERVICAL SEVEN  . BACK SURGERY    . BACK SURGERY     2 plates, 8 screws in back  . carpell tunnell     rt wrist  x2  . CATARACT EXTRACTION W/PHACO Left 06/14/2016   Procedure: CATARACT EXTRACTION PHACO AND INTRAOCULAR LENS PLACEMENT; CDE:  3.92;  Surgeon: Susa Simmondsarroll F Haines, MD;  Location: [facility];  Service: Ophthalmology;  Laterality: Left;  . CHOLECYSTECTOMY    . COLONOSCOPY  5/02   pancolonic deverticula, internal hemorrhoids  . COLONOSCOPY  1/11   single external hemorrhoidal tag and anal papilla otherwise normal rectum, pancolonic diverticula, s/p sigmoid biopsy and stool sampling all unremarkable  . ESOPHAGOGASTRODUODENOSCOPY  04/06/2010   intact Nissen fundoplication S/P dilation, somewhat baggy atonic appearing esophagus, 58-F dilation  . ESOPHAGOGASTRODUODENOSCOPY  1/11   nomral esophagus s/p 54-F Maloney dilation, normal/intact Nissen fundoplication, diffuse patchy erythema and erosions likely NSAID/ASA effect with benign biopsy  . EYE SURGERY     right eye cataract  . kidney surgery for cancer    . MAXIMUM ACCESS (MAS)POSTERIOR LUMBAR INTERBODY FUSION (PLIF) 1 LEVEL N/A 07/25/2014   Procedure: FOR MAXIMUM ACCESS (MAS) POSTERIOR LUMBAR INTERBODY FUSION (PLIF) Lumbar two/three, Removal of hardware Lumbar three/four;  Surgeon: Tia Alertavid S Jones, MD;  [facility];  Service: Neurosurgery;  Laterality: N/A;  . MAXIMUM ACCESS (MAS)POSTERIOR LUMBAR INTERBODY FUSION (PLIF) 2 LEVEL N/A 09/11/2015   Procedure: LUMBAR FOUR-FIVE LUMBAR FIVE SACRAL ONE  MAXIMUM ACCESS SURGERY, POSTERIOR LUMBAR INTERBODY FUSION , Removal of LUMBAR TWO TO LUMBAR FOUR  HARDWARE;  Surgeon: Eustace Moore, MD;  Location: [place] NEURO ORS;  Service: Neurosurgery;  Laterality: N/A;  . NISSEN FUNDOPLICATION    . REVERSE SHOULDER ARTHROPLASTY Right 05/28/2016   Procedure: RIGHT REVERSE SHOULDER ARTHROPLASTY;  Surgeon: Netta Cedars, MD;  Location: [place];  Service: Orthopedics;  Laterality:  Right;  . ROBOT ASSISTED LAPAROSCOPIC NEPHRECTOMY  08/23/2011   Procedure: ROBOTIC ASSISTED LAPAROSCOPIC NEPHRECTOMY;  Surgeon: Dutch Gray, MD;  Location: [facility];  Service: Urology;  Laterality: Left;  Left Robotic Assisted  Laparoscopic Partial Nephrectomy   . ROTATOR CUFF REPAIR     right side X 2  . TONSILLECTOMY    . TOTAL SHOULDER REVISION Right 08/19/2017   Procedure: RIGHT SHOULDER POLYETHYLENE EXCHANGE;  Surgeon: Netta Cedars, MD;  Location: [place];  Service: Orthopedics;  Laterality: Right;    There were no vitals filed for this visit.    Subjective Assessment - 09/29/20 1538    Subjective Patient is having pain by her shoulder blade near where her bra hits. States it started a couple of months ago (back in the Summer). At worst the pain is 8/10 and she takes a muscle relaxer to help with that. Current pain is 4/10. States her pain is usually pretty constant at 4/10. Pain is described as achy with a stabbing pain when she tries to do anything. Pain is on the right side. States she has a history of a right reverse shoulder replacement and can't bring her arm behind her back. States she would like to be able to pick stuff up off the floor.    Pertinent History right reverse shoulder replacement, lumbar and cervical fusions, kidney cancer (4 years ago), dizziness and history of high BP    Limitations Sitting;Lifting;Standing;House hold activities    Patient Stated Goals to be able to pick up items off the floor    Currently in Pain? Yes    Pain Score 4     Pain Location Shoulder    Pain Orientation Right    Pain Descriptors / Indicators Aching;Stabbing    Pain Type Chronic pain    Pain Onset More than a month ago    Pain Frequency Constant    Aggravating Factors  movement of trunk or shoulders    Pain Relieving Factors rest, muscle relaxor              [facility] PT Assessment - 09/29/20 0001      Assessment   Medical Diagnosis thoracic pain    Referring Provider (PT) Eustace Moore MD      Balance Screen   Has the patient fallen in the past 6 months Yes    How many times? 5   reports she told her doctor and she just gets so dizzy and disoriented when she stands up   Has the patient had a decrease in activity level because of a fear of falling?  Yes    Is the patient reluctant to leave their home because of a fear of falling?  Yes      [place] residence      Observation/Other Assessments   Focus on Therapeutic Outcomes (FOTO)  36%  Posture/Postural Control   Posture Comments slumped posture, rounded/forwards shoulders      ROM / Strength   AROM / PROM / Strength AROM      AROM   AROM Assessment Site Shoulder    Right/Left Shoulder Right;Left    Right Shoulder Flexion 90 Degrees    Right Shoulder ABduction 90 Degrees    Left Shoulder Flexion 120 Degrees   approx   Left Shoulder ABduction 110 Degrees      Palpation   Palpation comment tenderness and increased resting tone noted along right scapular boarder, thoracic paraspinals, and trapezius muscle.                      Objective measurements completed on examination: See above findings.               PT Education - 09/29/20 1549    Education Details Educated patient on importance of monitoring blood pressure and how it can cause reduced activity level, dizziness. On posture and more active posture versus passive posture.    Person(s) Educated Patient    Methods Explanation    Comprehension Verbalized understanding            PT Short Term Goals - 09/29/20 1634      PT SHORT TERM GOAL #1   Title Patient will be independent in self management strategies to improve quality of life and functional outcomes.    Time 3    Period Weeks    Status New    Target Date 10/20/20      PT SHORT TERM GOAL #2   Title Pt will report at least 25%improvement in overall symptoms and/or function to demonstrate improved functional mobility.     Time 3    Period Weeks    Status New    Target Date 10/20/20      PT SHORT TERM GOAL #3   Title Patient will demonstrate improved upright posture by sitting for 2 min without verbal cues to demo improved strength and function.    Time 3    Period Weeks    Status New    Target Date 10/20/20             PT Long Term Goals - 09/29/20 1637      PT LONG TERM GOAL #1   Title Patient will improve on FOTO score to meet predicted outcomes to demonstrate improved functional mobility    Baseline 36%    Time 6    Period Weeks    Status New    Target Date 11/10/20      PT LONG TERM GOAL #2   Title Pt will report at least 50% improvement in overall symptoms and/or function to demonstrate improved functional mobility.    Time 6    Period Weeks    Status New    Target Date 11/10/20                  Plan - 09/29/20 1616    Clinical Impression Statement Patient presents to PT with complaints of lower thoracic pain and pain around her R scapula that radiates up toward her shoulder/traps. She has a history of lumbar and cervical spinal fusions, and R reverse shoulder replacement approx 2 years ago. She presents with rounded shoulders and forward head posture that she is able to correct with visual and tactile cues which eases her pain. She presents with decreased R shoulder ROM and compensations in L shoulder.  Decreased mobility along spine globally and increased tightness along posterior trunk muscles, R worse than L.    Personal Factors and Comorbidities Time since onset of injury/illness/exacerbation    Examination-Activity Limitations Bend;Carry;Lift;Reach Overhead    Examination-Participation Restrictions Cleaning;Meal Prep;Laundry;Shop    Stability/Clinical Decision Making Stable/Uncomplicated    Clinical Decision Making Low    Rehab Potential Good    PT Frequency 2x / week    PT Duration 6 weeks    PT Treatment/Interventions Moist Heat;Cryotherapy;Electrical  Stimulation;Traction;Functional mobility training;Therapeutic activities;Therapeutic exercise;Balance training;Neuromuscular re-education;Taping;Manual techniques;Joint Manipulations;Spinal Manipulations;Dry needling;Passive range of motion;Scar mobilization;Patient/family education    PT Next Visit Plan postural adjustments, strength, mobilizations, coordination    PT Home Exercise Plan scap squeezes    Consulted and Agree with Plan of Care Patient           Patient will benefit from skilled therapeutic intervention in order to improve the following deficits and impairments:  Abnormal gait,Decreased activity tolerance,Decreased balance,Decreased mobility,Decreased strength,Postural dysfunction,Improper body mechanics,Impaired flexibility,Hypomobility,Pain,Dizziness,Impaired UE functional use,Decreased endurance,Decreased range of motion  Visit Diagnosis: Pain in thoracic spine  Muscle weakness (generalized)     Problem List Patient Active Problem List   Diagnosis Date Noted  . Cervical vertebral fusion 09/28/2017  . S/P shoulder replacement 05/28/2016  . Weight gain 01/28/2016  . Flatulence 01/28/2016  . S/P lumbar spinal fusion 07/25/2014  . Pain in joint, shoulder region 11/22/2013  . Decreased range of motion of right shoulder 11/22/2013  . Muscle tightness 11/22/2013  . Muscle weakness (generalized) 11/22/2013  . Low back pain 04/28/2011  . Post laminectomy syndrome 04/28/2011  . DYSPHAGIA 09/26/2009  . DIARRHEA 09/26/2009  . FULL INCONTINENCE OF FECES 09/26/2009    4:41 PM, 09/29/20 [name], DPT Physical Therapy with [facility]  [phone] office  4:41 PM, 09/29/20 Domenic Moras, DPT Physical Therapy with [facility]  [phone] office  [place] [address] [place], [place], [address] Phone: [phone]   Fax:  [phone]  Name: SUTTON BOSSARD MRN:  [id number] Date of Birth: September 05, 1946

## 2020-10-01 ENCOUNTER — Other Ambulatory Visit: Payer: Self-pay

## 2020-10-01 ENCOUNTER — Ambulatory Visit (HOSPITAL_COMMUNITY): Payer: Medicare Other | Admitting: Physical Therapy

## 2020-10-01 DIAGNOSIS — M546 Pain in thoracic spine: Secondary | ICD-10-CM

## 2020-10-01 DIAGNOSIS — M6281 Muscle weakness (generalized): Secondary | ICD-10-CM

## 2020-10-01 NOTE — Therapy (Addendum)
[facility] [address] [place], [place], [address] Phone: [phone]   Fax:  [phone]  Physical Therapy Treatment and Discharge note  Patient Details  Name: Victoria Mccarthy MRN: [id number] Date of Birth: Mar 05, 1946 Referring Provider (PT): Eustace Moore MD  PHYSICAL THERAPY DISCHARGE SUMMARY  Visits from Start of Care: 2  Current functional level related to goals / functional outcomes: Could not be reassessed secondary to unplanned discharge.   Remaining deficits: Could not be reassessed secondary to unplanned discharge.   Education / Equipment: Could not be reassessed secondary to unplanned discharge. Plan: Patient agrees to discharge.  Patient goals were not met. Patient is being discharged due to not returning since the last visit.  ?????     2:24 PM, 10/13/20 [name], DPT Physical Therapy with [facility]  [phone] office    Encounter Date: 10/01/2020   PT End of Session - 10/01/20 1629    Visit Number 2    Number of Visits 12    Date for PT Re-Evaluation 11/10/20    Authorization Type UHC Medicare primary, Medicaid Secondary no VL, no auth    Progress Note Due on Visit 10    PT Start Time 1452    PT Stop Time 1532    PT Time Calculation (min) 40 min    Activity Tolerance Patient tolerated treatment well    Behavior During Therapy WFL for tasks assessed/performed           Past Medical History:  Diagnosis Date  . Anemia    hx  . Anxiety   . Arthritis    chronic back pain  . Bipolar disorder ([place])   . Cancer [facility]) Jan 2013   kidney cancer, left, s/p partial nephrectomy  . Depression   . Diarrhea    incontinent stools  . Diverticulitis    treated at least 5 times in past, hospitalized twice  . Heart murmur   . History of hiatal hernia    s/p  . MVP (mitral valve prolapse)   . Neuropathy   . Nocturia   . Osteoarthritis   . Osteoporosis   . PONV (postoperative  nausea and vomiting)   . Psychotic affective disorder ([place])    "psychotic delusions" "I cut myself"   . Restless leg syndrome   . Rheumatic fever   . Urinary frequency     Past Surgical History:  Procedure Laterality Date  . ABDOMINAL HYSTERECTOMY    . ANTERIOR CERVICAL DECOMP/DISCECTOMY FUSION N/A 09/28/2017   Procedure: ANTERIOR CERVICAL DECOMPRESSION/DISCECTOMY FUSION CERVICAL FOUR- CERVICAL FIVE, CERVICAL FIVE- CERVICAL SIX, CERVICAL SIX- CERVICAL SEVEN;  Surgeon: Eustace Moore, MD;  Location: [facility];  Service: Neurosurgery;  Laterality: N/A;  ANTERIOR CERVICAL DECOMPRESSION/DISCECTOMY FUSION CERVICAL FOUR- CERVICAL FIVE, CERVICAL FIVE- CERVICAL SIX, CERVICAL SIX- CERVICAL SEVEN  . BACK SURGERY    . BACK SURGERY     2 plates, 8 screws in back  . carpell tunnell     rt wrist  x2  . CATARACT EXTRACTION W/PHACO Left 06/14/2016   Procedure: CATARACT EXTRACTION PHACO AND INTRAOCULAR LENS PLACEMENT; CDE:  3.92;  Surgeon: Williams Che, MD;  Location: [facility];  Service: Ophthalmology;  Laterality: Left;  . CHOLECYSTECTOMY    . COLONOSCOPY  5/02   pancolonic deverticula, internal hemorrhoids  . COLONOSCOPY  1/11   single external hemorrhoidal tag and anal papilla otherwise normal rectum, pancolonic diverticula, s/p sigmoid biopsy and stool sampling all unremarkable  .  ESOPHAGOGASTRODUODENOSCOPY  04/06/2010   intact Nissen fundoplication S/P dilation, somewhat baggy atonic appearing esophagus, 58-F dilation  . ESOPHAGOGASTRODUODENOSCOPY  1/11   nomral esophagus s/p 54-F Maloney dilation, normal/intact Nissen fundoplication, diffuse patchy erythema and erosions likely NSAID/ASA effect with benign biopsy  . EYE SURGERY     right eye cataract  . kidney surgery for cancer    . MAXIMUM ACCESS (MAS)POSTERIOR LUMBAR INTERBODY FUSION (PLIF) 1 LEVEL N/A 07/25/2014   Procedure: FOR MAXIMUM ACCESS (MAS) POSTERIOR LUMBAR INTERBODY FUSION (PLIF) Lumbar two/three, Removal of hardware Lumbar three/four;   Surgeon: Eustace Moore, MD;  Location: [facility];  Service: Neurosurgery;  Laterality: N/A;  . MAXIMUM ACCESS (MAS)POSTERIOR LUMBAR INTERBODY FUSION (PLIF) 2 LEVEL N/A 09/11/2015   Procedure: LUMBAR FOUR-FIVE LUMBAR FIVE SACRAL ONE  MAXIMUM ACCESS SURGERY, POSTERIOR LUMBAR INTERBODY FUSION , Removal of LUMBAR TWO TO LUMBAR FOUR  HARDWARE;  Surgeon: Eustace Moore, MD;  Location: [facility];  Service: Neurosurgery;  Laterality: N/A;  . NISSEN FUNDOPLICATION    . REVERSE SHOULDER ARTHROPLASTY Right 05/28/2016   Procedure: RIGHT REVERSE SHOULDER ARTHROPLASTY;  Surgeon: Netta Cedars, MD;  Location: [facility];  Service: Orthopedics;  Laterality: Right;  . ROBOT ASSISTED LAPAROSCOPIC NEPHRECTOMY  08/23/2011   Procedure: ROBOTIC ASSISTED LAPAROSCOPIC NEPHRECTOMY;  Surgeon: Dutch Gray, MD;  Location: [facility];  Service: Urology;  Laterality: Left;  Left Robotic Assisted  Laparoscopic Partial Nephrectomy   . ROTATOR CUFF REPAIR     right side X 2  . TONSILLECTOMY    . TOTAL SHOULDER REVISION Right 08/19/2017   Procedure: RIGHT SHOULDER POLYETHYLENE EXCHANGE;  Surgeon: Netta Cedars, MD;  Location: [facility];  Service: Orthopedics;  Laterality: Right;    There were no vitals filed for this visit.   Subjective Assessment - 10/01/20 1457    Subjective pt states she did not sleep good last night and reports waking up stiff and sore and without energy.  States she sleeps in her lift chair at night.                             [facility] Adult PT Treatment/Exercise - 10/01/20 0001      Lumbar Exercises: Stretches   Lower Trunk Rotation 5 reps;10 seconds    Other Lumbar Stretch Exercise standing corner stretch for chest (modified for Rt UE) 2 X 30"      Lumbar Exercises: Supine   Ab Set 10 reps;5 seconds    Glut Set 10 reps;5 seconds    Bridge 5 reps    Other Supine Lumbar Exercises logroll for supine to sit, sit to supine                  PT Education - 10/01/20 1520    Education  Details review of goals, HEP and POC moving foward. Educated on how sitting/sleeping in chair impacts back; encouraged to return to bed for sleep; not to use lift feature on chair to stand.  Discussed current findings and patient complaints and encouraged to contact primary MD and make appointment for blood work/ physical.    Person(s) Educated Patient    Methods Explanation;Demonstration;Tactile cues;Verbal cues    Comprehension Verbalized understanding            PT Short Term Goals - 10/01/20 1505      PT SHORT TERM GOAL #1   Title Patient will be independent in self management strategies to improve quality of life and  functional outcomes.    Time 3    Period Weeks    Status On-going    Target Date 10/20/20      PT SHORT TERM GOAL #2   Title Pt will report at least 25%improvement in overall symptoms and/or function to demonstrate improved functional mobility.    Time 3    Period Weeks    Status On-going    Target Date 10/20/20      PT SHORT TERM GOAL #3   Title Patient will demonstrate improved upright posture by sitting for 2 min without verbal cues to demo improved strength and function.    Time 3    Period Weeks    Status On-going    Target Date 10/20/20             PT Long Term Goals - 10/01/20 1507      PT LONG TERM GOAL #1   Title Patient will improve on FOTO score to meet predicted outcomes to demonstrate improved functional mobility    Baseline 36%    Time 6    Period Weeks    Status On-going      PT LONG TERM GOAL #2   Title Pt will report at least 50% improvement in overall symptoms and/or function to demonstrate improved functional mobility.    Time 6    Period Weeks    Status On-going                 Plan - 10/01/20 1659    Clinical Impression Statement Review of goals, HEP and POC moving foward. Educated on how sitting/sleeping in chair impacts back; encouraged to return to bed for sleep; not to use lift feature on chair to stand.  Discussed current findings and patient complaints and encouraged to contact primary MD and make appointment for blood work/ physical.  Progressed therex with addition of standing chest stretch, supine lumbar rotation, abdominal and glute isometrics.  Cues to complete therex in painfree ROM, slowly and controlled.  Pt with much improved gait upon leaving clinic today and voiced overall improvement in pain level.    Personal Factors and Comorbidities Time since onset of injury/illness/exacerbation    Examination-Activity Limitations Bend;Carry;Lift;Reach Overhead    Examination-Participation Restrictions Cleaning;Meal Prep;Laundry;Shop    Stability/Clinical Decision Making Stable/Uncomplicated    Rehab Potential Good    PT Frequency 2x / week    PT Duration 6 weeks    PT Treatment/Interventions Moist Heat;Cryotherapy;Electrical Stimulation;Traction;Functional mobility training;Therapeutic activities;Therapeutic exercise;Balance training;Neuromuscular re-education;Taping;Manual techniques;Joint Manipulations;Spinal Manipulations;Dry needling;Passive range of motion;Scar mobilization;Patient/family education    PT Next Visit Plan continue to increase postural strength and spinal mobility.  Improve gait quality and stability.  F/U if she was able to sleep in the bed over the weekend.    PT Home Exercise Plan scap squeezes  1/12: supine trunk rotations, abdominal bracing, bridge    Consulted and Agree with Plan of Care Patient           Patient will benefit from skilled therapeutic intervention in order to improve the following deficits and impairments:  Abnormal gait,Decreased activity tolerance,Decreased balance,Decreased mobility,Decreased strength,Postural dysfunction,Improper body mechanics,Impaired flexibility,Hypomobility,Pain,Dizziness,Impaired UE functional use,Decreased endurance,Decreased range of motion  Visit Diagnosis: Pain in thoracic spine  Muscle weakness  (generalized)     Problem List Patient Active Problem List   Diagnosis Date Noted  . Cervical vertebral fusion 09/28/2017  . S/P shoulder replacement 05/28/2016  . Weight gain 01/28/2016  . Flatulence 01/28/2016  . S/P lumbar spinal fusion  07/25/2014  . Pain in joint, shoulder region 11/22/2013  . Decreased range of motion of right shoulder 11/22/2013  . Muscle tightness 11/22/2013  . Muscle weakness (generalized) 11/22/2013  . Low back pain 04/28/2011  . Post laminectomy syndrome 04/28/2011  . DYSPHAGIA 09/26/2009  . DIARRHEA 09/26/2009  . FULL INCONTINENCE OF FECES 09/26/2009   Teena Irani, PTA/CLT [phone]  Teena Irani 10/01/2020, 5:08 PM  [facility] [place], [place], [address] Phone: [phone]   Fax:  [phone]  Name: ICESS BERTONI MRN: [id number] Date of Birth: Jan 21, 1946

## 2020-10-01 NOTE — Patient Instructions (Signed)
Bridge    Lie back, legs bent. Inhale, pressing hips up. Keeping ribs in, lengthen lower back. Exhale, rolling down along spine from top. Repeat _10__ times. Do _2_ sessions per day.   Abdominal Bracing With Pelvic Floor (Hook-Lying)    With neutral spine, tighten pelvic floor and abdominals. Repeat _10_ times.Hold_-5_-seconds.   Do _2__ times a day.   Knee Roll    Lying on back, with knees bent and feet flat on floor, slowly roll both knees to side, hold_10_ seconds. then to opposite side, hold_10_ seconds. Complete _5_times

## 2020-10-07 ENCOUNTER — Ambulatory Visit (HOSPITAL_COMMUNITY): Payer: Medicare Other | Admitting: Physical Therapy

## 2020-10-07 ENCOUNTER — Telehealth (HOSPITAL_COMMUNITY): Payer: Self-pay | Admitting: Physical Therapy
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
## 2020-10-08 ENCOUNTER — Other Ambulatory Visit (HOSPITAL_COMMUNITY): Payer: Self-pay | Admitting: Psychiatry

## 2020-10-10 ENCOUNTER — Ambulatory Visit (HOSPITAL_COMMUNITY): Payer: Medicare Other

## 2020-10-10 ENCOUNTER — Telehealth (HOSPITAL_COMMUNITY): Payer: Self-pay

## 2020-10-10 NOTE — Telephone Encounter (Signed)
Consecutive no show #2.  Called and left message concerning missed apt today.  Reminded next apt date and time with contact information included if needs to cancel or reschedule further apts.  Included no show policy details in message, pt will need to schedule apts 1 at time.  Ihor Austin, LPTA/CLT; Delana Meyer [phone]

## 2020-10-13 ENCOUNTER — Ambulatory Visit (HOSPITAL_COMMUNITY): Payer: Medicare Other | Admitting: Physical Therapy

## 2020-10-13 ENCOUNTER — Telehealth (HOSPITAL_COMMUNITY): Payer: Self-pay | Admitting: Physical Therapy

## 2020-10-13 NOTE — Telephone Encounter (Signed)
No show #3. Left voicemail about missed appointment and about no show policy. Will discharge patient at this time.  2:22 PM, 10/13/20 [name], DPT Physical Therapy with [facility]  [phone] office

## 2020-10-15 ENCOUNTER — Ambulatory Visit (HOSPITAL_COMMUNITY): Payer: Medicare Other | Admitting: Physical Therapy

## 2020-10-20 ENCOUNTER — Encounter (HOSPITAL_COMMUNITY): Payer: Medicare Other | Admitting: Physical Therapy

## 2020-10-22 ENCOUNTER — Encounter (HOSPITAL_COMMUNITY): Payer: Medicare Other | Admitting: Physical Therapy

## 2020-10-27 ENCOUNTER — Encounter (HOSPITAL_COMMUNITY): Payer: Medicare Other | Admitting: Physical Therapy

## 2020-10-30 ENCOUNTER — Encounter (HOSPITAL_COMMUNITY): Payer: Medicare Other | Admitting: Physical Therapy

## 2020-11-03 ENCOUNTER — Encounter (HOSPITAL_COMMUNITY): Payer: Medicare Other | Admitting: Physical Therapy

## 2020-11-03 DIAGNOSIS — D179 Benign lipomatous neoplasm, unspecified: Secondary | ICD-10-CM | POA: Diagnosis not present

## 2020-11-03 DIAGNOSIS — I10 Essential (primary) hypertension: Secondary | ICD-10-CM | POA: Diagnosis not present

## 2020-11-03 DIAGNOSIS — Z6835 Body mass index (BMI) 35.0-35.9, adult: Secondary | ICD-10-CM | POA: Diagnosis not present

## 2020-11-03 DIAGNOSIS — M6281 Muscle weakness (generalized): Secondary | ICD-10-CM | POA: Diagnosis not present

## 2020-11-06 ENCOUNTER — Encounter (HOSPITAL_COMMUNITY): Payer: Medicare Other | Admitting: Physical Therapy

## 2020-11-07 DIAGNOSIS — N3281 Overactive bladder: Secondary | ICD-10-CM | POA: Diagnosis not present

## 2020-11-07 DIAGNOSIS — N3946 Mixed incontinence: Secondary | ICD-10-CM | POA: Diagnosis not present

## 2020-11-07 DIAGNOSIS — N189 Chronic kidney disease, unspecified: Secondary | ICD-10-CM | POA: Diagnosis not present

## 2020-11-10 ENCOUNTER — Telehealth (HOSPITAL_COMMUNITY): Payer: No Typology Code available for payment source | Admitting: Psychiatry

## 2020-11-10 ENCOUNTER — Other Ambulatory Visit: Payer: Self-pay

## 2020-11-11 ENCOUNTER — Encounter (HOSPITAL_COMMUNITY): Payer: Medicare Other | Admitting: Physical Therapy

## 2020-11-13 ENCOUNTER — Encounter (HOSPITAL_COMMUNITY): Payer: Medicare Other | Admitting: Physical Therapy

## 2020-11-17 DIAGNOSIS — M159 Polyosteoarthritis, unspecified: Secondary | ICD-10-CM | POA: Diagnosis not present

## 2020-11-17 DIAGNOSIS — E7849 Other hyperlipidemia: Secondary | ICD-10-CM | POA: Diagnosis not present

## 2020-11-17 DIAGNOSIS — R69 Illness, unspecified: Secondary | ICD-10-CM | POA: Diagnosis not present

## 2020-12-02 ENCOUNTER — Other Ambulatory Visit (HOSPITAL_COMMUNITY): Payer: Self-pay | Admitting: Psychiatry

## 2020-12-02 NOTE — Telephone Encounter (Signed)
Call for appt

## 2020-12-04 ENCOUNTER — Telehealth (HOSPITAL_COMMUNITY): Payer: Self-pay | Admitting: Psychiatry

## 2020-12-04 DIAGNOSIS — L720 Epidermal cyst: Secondary | ICD-10-CM | POA: Diagnosis not present

## 2020-12-04 NOTE — Telephone Encounter (Signed)
Called to schedule f/u appt, unable to leave vm due to mailbox not being set up for phone number

## 2020-12-10 NOTE — Telephone Encounter (Signed)
Called patient and LMOM to call back and sch f/u per provider

## 2020-12-17 ENCOUNTER — Telehealth (HOSPITAL_COMMUNITY): Payer: Self-pay | Admitting: Psychiatry

## 2020-12-17 NOTE — Telephone Encounter (Signed)
Called to schedule f/u appt, left detailed vm - last 2 scheduled appts were no shows

## 2020-12-18 DIAGNOSIS — L720 Epidermal cyst: Secondary | ICD-10-CM | POA: Diagnosis not present

## 2020-12-23 ENCOUNTER — Telehealth (HOSPITAL_COMMUNITY): Payer: Self-pay | Admitting: Psychiatry

## 2020-12-23 NOTE — Telephone Encounter (Signed)
Called to schedule follow up appt left detailed voicemail

## 2020-12-26 DIAGNOSIS — N3281 Overactive bladder: Secondary | ICD-10-CM | POA: Diagnosis not present

## 2020-12-26 DIAGNOSIS — N3946 Mixed incontinence: Secondary | ICD-10-CM | POA: Diagnosis not present

## 2020-12-26 DIAGNOSIS — N189 Chronic kidney disease, unspecified: Secondary | ICD-10-CM | POA: Diagnosis not present

## 2021-01-07 ENCOUNTER — Other Ambulatory Visit (HOSPITAL_COMMUNITY): Payer: Self-pay | Admitting: Psychiatry

## 2021-01-07 NOTE — Telephone Encounter (Signed)
Call for appt, no refills until appt made

## 2021-01-17 DIAGNOSIS — E7849 Other hyperlipidemia: Secondary | ICD-10-CM | POA: Diagnosis not present

## 2021-01-17 DIAGNOSIS — R69 Illness, unspecified: Secondary | ICD-10-CM | POA: Diagnosis not present

## 2021-01-17 DIAGNOSIS — M159 Polyosteoarthritis, unspecified: Secondary | ICD-10-CM | POA: Diagnosis not present

## 2021-01-21 ENCOUNTER — Other Ambulatory Visit (HOSPITAL_COMMUNITY): Payer: Self-pay | Admitting: Physician Assistant

## 2021-01-21 DIAGNOSIS — Z6835 Body mass index (BMI) 35.0-35.9, adult: Secondary | ICD-10-CM | POA: Diagnosis not present

## 2021-01-21 DIAGNOSIS — I10 Essential (primary) hypertension: Secondary | ICD-10-CM | POA: Diagnosis not present

## 2021-01-21 DIAGNOSIS — E6609 Other obesity due to excess calories: Secondary | ICD-10-CM | POA: Diagnosis not present

## 2021-01-21 DIAGNOSIS — Z1331 Encounter for screening for depression: Secondary | ICD-10-CM | POA: Diagnosis not present

## 2021-01-21 DIAGNOSIS — E785 Hyperlipidemia, unspecified: Secondary | ICD-10-CM | POA: Diagnosis not present

## 2021-01-21 DIAGNOSIS — E782 Mixed hyperlipidemia: Secondary | ICD-10-CM | POA: Diagnosis not present

## 2021-01-21 DIAGNOSIS — Z1231 Encounter for screening mammogram for malignant neoplasm of breast: Secondary | ICD-10-CM

## 2021-01-21 DIAGNOSIS — Z0001 Encounter for general adult medical examination with abnormal findings: Secondary | ICD-10-CM | POA: Diagnosis not present

## 2021-01-21 DIAGNOSIS — R2689 Other abnormalities of gait and mobility: Secondary | ICD-10-CM | POA: Diagnosis not present

## 2021-01-22 ENCOUNTER — Other Ambulatory Visit: Payer: Self-pay

## 2021-01-22 ENCOUNTER — Encounter (HOSPITAL_COMMUNITY): Payer: Self-pay | Admitting: Psychiatry

## 2021-01-22 ENCOUNTER — Telehealth (INDEPENDENT_AMBULATORY_CARE_PROVIDER_SITE_OTHER): Payer: Medicare HMO | Admitting: Psychiatry

## 2021-01-22 DIAGNOSIS — F3130 Bipolar disorder, current episode depressed, mild or moderate severity, unspecified: Secondary | ICD-10-CM

## 2021-01-22 DIAGNOSIS — R69 Illness, unspecified: Secondary | ICD-10-CM | POA: Diagnosis not present

## 2021-01-22 MED ORDER — FLUOXETINE HCL 40 MG PO CAPS: 40.0000 mg | ORAL_CAPSULE | Freq: Two times a day (BID) | ORAL | 2 refills | Status: DC

## 2021-01-22 MED ORDER — TRAZODONE HCL 100 MG PO TABS
100.0000 mg | ORAL_TABLET | Freq: Every day | ORAL | 2 refills | Status: DC
Start: 2021-01-22 — End: 2021-06-17

## 2021-01-22 MED ORDER — ALPRAZOLAM 1 MG PO TABS: 1.0000 mg | ORAL_TABLET | Freq: Two times a day (BID) | ORAL | 2 refills | Status: DC

## 2021-01-22 MED ORDER — OLANZAPINE 20 MG PO TABS: 20.0000 mg | ORAL_TABLET | Freq: Every day | ORAL | 2 refills | Status: DC
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
## 2021-01-26 DIAGNOSIS — N3281 Overactive bladder: Secondary | ICD-10-CM | POA: Diagnosis not present

## 2021-01-26 DIAGNOSIS — N3946 Mixed incontinence: Secondary | ICD-10-CM | POA: Diagnosis not present

## 2021-01-29 ENCOUNTER — Ambulatory Visit (HOSPITAL_COMMUNITY): Payer: Self-pay

## 2021-01-30 ENCOUNTER — Other Ambulatory Visit: Payer: Self-pay | Admitting: Gastroenterology

## 2021-02-03 NOTE — Telephone Encounter (Signed)
Called number on file and was not able to reach patient to sch f/u appt and there was not voicemail

## 2021-02-05 DIAGNOSIS — Z9181 History of falling: Secondary | ICD-10-CM | POA: Diagnosis not present

## 2021-02-05 DIAGNOSIS — E6609 Other obesity due to excess calories: Secondary | ICD-10-CM | POA: Diagnosis not present

## 2021-02-05 DIAGNOSIS — Z6835 Body mass index (BMI) 35.0-35.9, adult: Secondary | ICD-10-CM | POA: Diagnosis not present

## 2021-02-05 DIAGNOSIS — M545 Low back pain, unspecified: Secondary | ICD-10-CM | POA: Diagnosis not present

## 2021-02-05 DIAGNOSIS — R69 Illness, unspecified: Secondary | ICD-10-CM | POA: Diagnosis not present

## 2021-02-05 DIAGNOSIS — I10 Essential (primary) hypertension: Secondary | ICD-10-CM | POA: Diagnosis not present

## 2021-02-05 DIAGNOSIS — Z90710 Acquired absence of both cervix and uterus: Secondary | ICD-10-CM | POA: Diagnosis not present

## 2021-02-05 DIAGNOSIS — G8929 Other chronic pain: Secondary | ICD-10-CM | POA: Diagnosis not present

## 2021-02-17 DIAGNOSIS — E7849 Other hyperlipidemia: Secondary | ICD-10-CM | POA: Diagnosis not present

## 2021-02-17 DIAGNOSIS — R2689 Other abnormalities of gait and mobility: Secondary | ICD-10-CM | POA: Diagnosis not present

## 2021-02-17 DIAGNOSIS — R69 Illness, unspecified: Secondary | ICD-10-CM | POA: Diagnosis not present

## 2021-02-17 DIAGNOSIS — M25511 Pain in right shoulder: Secondary | ICD-10-CM | POA: Diagnosis not present

## 2021-02-17 DIAGNOSIS — G8929 Other chronic pain: Secondary | ICD-10-CM | POA: Diagnosis not present

## 2021-02-17 DIAGNOSIS — M159 Polyosteoarthritis, unspecified: Secondary | ICD-10-CM | POA: Diagnosis not present

## 2021-02-17 DIAGNOSIS — M545 Low back pain, unspecified: Secondary | ICD-10-CM | POA: Diagnosis not present

## 2021-02-17 DIAGNOSIS — I10 Essential (primary) hypertension: Secondary | ICD-10-CM | POA: Diagnosis not present

## 2021-02-19 DIAGNOSIS — R69 Illness, unspecified: Secondary | ICD-10-CM | POA: Diagnosis not present

## 2021-02-19 DIAGNOSIS — G8929 Other chronic pain: Secondary | ICD-10-CM | POA: Diagnosis not present

## 2021-02-19 DIAGNOSIS — Z6835 Body mass index (BMI) 35.0-35.9, adult: Secondary | ICD-10-CM | POA: Diagnosis not present

## 2021-02-19 DIAGNOSIS — Z9181 History of falling: Secondary | ICD-10-CM | POA: Diagnosis not present

## 2021-02-19 DIAGNOSIS — M545 Low back pain, unspecified: Secondary | ICD-10-CM | POA: Diagnosis not present

## 2021-02-19 DIAGNOSIS — Z90710 Acquired absence of both cervix and uterus: Secondary | ICD-10-CM | POA: Diagnosis not present

## 2021-02-19 DIAGNOSIS — I10 Essential (primary) hypertension: Secondary | ICD-10-CM | POA: Diagnosis not present

## 2021-02-19 DIAGNOSIS — E6609 Other obesity due to excess calories: Secondary | ICD-10-CM | POA: Diagnosis not present

## 2021-02-25 DIAGNOSIS — Z9181 History of falling: Secondary | ICD-10-CM | POA: Diagnosis not present

## 2021-02-25 DIAGNOSIS — R69 Illness, unspecified: Secondary | ICD-10-CM | POA: Diagnosis not present

## 2021-02-25 DIAGNOSIS — I10 Essential (primary) hypertension: Secondary | ICD-10-CM | POA: Diagnosis not present

## 2021-02-25 DIAGNOSIS — M545 Low back pain, unspecified: Secondary | ICD-10-CM | POA: Diagnosis not present

## 2021-02-25 DIAGNOSIS — Z6835 Body mass index (BMI) 35.0-35.9, adult: Secondary | ICD-10-CM | POA: Diagnosis not present

## 2021-02-25 DIAGNOSIS — Z90710 Acquired absence of both cervix and uterus: Secondary | ICD-10-CM | POA: Diagnosis not present

## 2021-02-25 DIAGNOSIS — G8929 Other chronic pain: Secondary | ICD-10-CM | POA: Diagnosis not present

## 2021-02-25 DIAGNOSIS — E6609 Other obesity due to excess calories: Secondary | ICD-10-CM | POA: Diagnosis not present

## 2021-02-27 DIAGNOSIS — R293 Abnormal posture: Secondary | ICD-10-CM | POA: Diagnosis not present

## 2021-02-27 DIAGNOSIS — M6281 Muscle weakness (generalized): Secondary | ICD-10-CM | POA: Diagnosis not present

## 2021-02-27 DIAGNOSIS — M25611 Stiffness of right shoulder, not elsewhere classified: Secondary | ICD-10-CM | POA: Diagnosis not present

## 2021-02-27 DIAGNOSIS — M40204 Unspecified kyphosis, thoracic region: Secondary | ICD-10-CM | POA: Diagnosis not present

## 2021-02-27 DIAGNOSIS — R2689 Other abnormalities of gait and mobility: Secondary | ICD-10-CM | POA: Diagnosis not present

## 2021-02-27 DIAGNOSIS — R42 Dizziness and giddiness: Secondary | ICD-10-CM | POA: Diagnosis not present

## 2021-02-27 DIAGNOSIS — M25511 Pain in right shoulder: Secondary | ICD-10-CM | POA: Diagnosis not present

## 2021-03-05 DIAGNOSIS — M25511 Pain in right shoulder: Secondary | ICD-10-CM | POA: Diagnosis not present

## 2021-03-05 DIAGNOSIS — M6281 Muscle weakness (generalized): Secondary | ICD-10-CM | POA: Diagnosis not present

## 2021-03-05 DIAGNOSIS — M40204 Unspecified kyphosis, thoracic region: Secondary | ICD-10-CM | POA: Diagnosis not present

## 2021-03-05 DIAGNOSIS — R293 Abnormal posture: Secondary | ICD-10-CM | POA: Diagnosis not present

## 2021-03-05 DIAGNOSIS — R2689 Other abnormalities of gait and mobility: Secondary | ICD-10-CM | POA: Diagnosis not present

## 2021-03-05 DIAGNOSIS — R42 Dizziness and giddiness: Secondary | ICD-10-CM | POA: Diagnosis not present

## 2021-03-05 DIAGNOSIS — M545 Low back pain, unspecified: Secondary | ICD-10-CM | POA: Diagnosis not present

## 2021-03-05 DIAGNOSIS — M25611 Stiffness of right shoulder, not elsewhere classified: Secondary | ICD-10-CM | POA: Diagnosis not present

## 2021-03-10 DIAGNOSIS — M6281 Muscle weakness (generalized): Secondary | ICD-10-CM | POA: Diagnosis not present

## 2021-03-10 DIAGNOSIS — R293 Abnormal posture: Secondary | ICD-10-CM | POA: Diagnosis not present

## 2021-03-10 DIAGNOSIS — M40204 Unspecified kyphosis, thoracic region: Secondary | ICD-10-CM | POA: Diagnosis not present

## 2021-03-10 DIAGNOSIS — M25511 Pain in right shoulder: Secondary | ICD-10-CM | POA: Diagnosis not present

## 2021-03-10 DIAGNOSIS — M545 Low back pain, unspecified: Secondary | ICD-10-CM | POA: Diagnosis not present

## 2021-03-10 DIAGNOSIS — R2689 Other abnormalities of gait and mobility: Secondary | ICD-10-CM | POA: Diagnosis not present

## 2021-03-10 DIAGNOSIS — M25611 Stiffness of right shoulder, not elsewhere classified: Secondary | ICD-10-CM | POA: Diagnosis not present

## 2021-03-10 DIAGNOSIS — R42 Dizziness and giddiness: Secondary | ICD-10-CM | POA: Diagnosis not present

## 2021-03-12 ENCOUNTER — Other Ambulatory Visit: Payer: Self-pay | Admitting: Gastroenterology

## 2021-03-13 DIAGNOSIS — M40204 Unspecified kyphosis, thoracic region: Secondary | ICD-10-CM | POA: Diagnosis not present

## 2021-03-13 DIAGNOSIS — M545 Low back pain, unspecified: Secondary | ICD-10-CM | POA: Diagnosis not present

## 2021-03-13 DIAGNOSIS — M6281 Muscle weakness (generalized): Secondary | ICD-10-CM | POA: Diagnosis not present

## 2021-03-13 DIAGNOSIS — M25611 Stiffness of right shoulder, not elsewhere classified: Secondary | ICD-10-CM | POA: Diagnosis not present

## 2021-03-13 DIAGNOSIS — R2689 Other abnormalities of gait and mobility: Secondary | ICD-10-CM | POA: Diagnosis not present

## 2021-03-13 DIAGNOSIS — R42 Dizziness and giddiness: Secondary | ICD-10-CM | POA: Diagnosis not present

## 2021-03-13 DIAGNOSIS — R293 Abnormal posture: Secondary | ICD-10-CM | POA: Diagnosis not present

## 2021-03-13 DIAGNOSIS — M25511 Pain in right shoulder: Secondary | ICD-10-CM | POA: Diagnosis not present

## 2021-03-16 NOTE — Telephone Encounter (Signed)
Patient needs ov for further refills. Limited refills provided.

## 2021-03-17 ENCOUNTER — Encounter: Payer: Self-pay | Admitting: Internal Medicine

## 2021-03-17 DIAGNOSIS — M25611 Stiffness of right shoulder, not elsewhere classified: Secondary | ICD-10-CM | POA: Diagnosis not present

## 2021-03-17 DIAGNOSIS — M40204 Unspecified kyphosis, thoracic region: Secondary | ICD-10-CM | POA: Diagnosis not present

## 2021-03-17 DIAGNOSIS — M6281 Muscle weakness (generalized): Secondary | ICD-10-CM | POA: Diagnosis not present

## 2021-03-17 DIAGNOSIS — R42 Dizziness and giddiness: Secondary | ICD-10-CM | POA: Diagnosis not present

## 2021-03-17 DIAGNOSIS — M545 Low back pain, unspecified: Secondary | ICD-10-CM | POA: Diagnosis not present

## 2021-03-17 DIAGNOSIS — M25511 Pain in right shoulder: Secondary | ICD-10-CM | POA: Diagnosis not present

## 2021-03-17 DIAGNOSIS — R2689 Other abnormalities of gait and mobility: Secondary | ICD-10-CM | POA: Diagnosis not present

## 2021-03-17 DIAGNOSIS — R293 Abnormal posture: Secondary | ICD-10-CM | POA: Diagnosis not present

## 2021-03-19 DIAGNOSIS — R2689 Other abnormalities of gait and mobility: Secondary | ICD-10-CM | POA: Diagnosis not present

## 2021-03-19 DIAGNOSIS — R69 Illness, unspecified: Secondary | ICD-10-CM | POA: Diagnosis not present

## 2021-03-19 DIAGNOSIS — M25611 Stiffness of right shoulder, not elsewhere classified: Secondary | ICD-10-CM | POA: Diagnosis not present

## 2021-03-19 DIAGNOSIS — E782 Mixed hyperlipidemia: Secondary | ICD-10-CM | POA: Diagnosis not present

## 2021-03-19 DIAGNOSIS — M40204 Unspecified kyphosis, thoracic region: Secondary | ICD-10-CM | POA: Diagnosis not present

## 2021-03-19 DIAGNOSIS — M545 Low back pain, unspecified: Secondary | ICD-10-CM | POA: Diagnosis not present

## 2021-03-19 DIAGNOSIS — M159 Polyosteoarthritis, unspecified: Secondary | ICD-10-CM | POA: Diagnosis not present

## 2021-03-19 DIAGNOSIS — M6281 Muscle weakness (generalized): Secondary | ICD-10-CM | POA: Diagnosis not present

## 2021-03-19 DIAGNOSIS — R42 Dizziness and giddiness: Secondary | ICD-10-CM | POA: Diagnosis not present

## 2021-03-19 DIAGNOSIS — M25511 Pain in right shoulder: Secondary | ICD-10-CM | POA: Diagnosis not present

## 2021-03-19 DIAGNOSIS — R293 Abnormal posture: Secondary | ICD-10-CM | POA: Diagnosis not present

## 2021-03-24 DIAGNOSIS — N3281 Overactive bladder: Secondary | ICD-10-CM | POA: Diagnosis not present

## 2021-03-24 DIAGNOSIS — N3946 Mixed incontinence: Secondary | ICD-10-CM | POA: Diagnosis not present

## 2021-03-25 DIAGNOSIS — M545 Low back pain, unspecified: Secondary | ICD-10-CM | POA: Diagnosis not present

## 2021-03-25 DIAGNOSIS — R2689 Other abnormalities of gait and mobility: Secondary | ICD-10-CM | POA: Diagnosis not present

## 2021-03-25 DIAGNOSIS — R42 Dizziness and giddiness: Secondary | ICD-10-CM | POA: Diagnosis not present

## 2021-03-25 DIAGNOSIS — R293 Abnormal posture: Secondary | ICD-10-CM | POA: Diagnosis not present

## 2021-03-25 DIAGNOSIS — M25611 Stiffness of right shoulder, not elsewhere classified: Secondary | ICD-10-CM | POA: Diagnosis not present

## 2021-03-25 DIAGNOSIS — M25511 Pain in right shoulder: Secondary | ICD-10-CM | POA: Diagnosis not present

## 2021-03-25 DIAGNOSIS — M6281 Muscle weakness (generalized): Secondary | ICD-10-CM | POA: Diagnosis not present

## 2021-03-25 DIAGNOSIS — M40204 Unspecified kyphosis, thoracic region: Secondary | ICD-10-CM | POA: Diagnosis not present

## 2021-03-27 DIAGNOSIS — R293 Abnormal posture: Secondary | ICD-10-CM | POA: Diagnosis not present

## 2021-03-27 DIAGNOSIS — M25611 Stiffness of right shoulder, not elsewhere classified: Secondary | ICD-10-CM | POA: Diagnosis not present

## 2021-03-27 DIAGNOSIS — R2689 Other abnormalities of gait and mobility: Secondary | ICD-10-CM | POA: Diagnosis not present

## 2021-03-27 DIAGNOSIS — M545 Low back pain, unspecified: Secondary | ICD-10-CM | POA: Diagnosis not present

## 2021-03-27 DIAGNOSIS — R42 Dizziness and giddiness: Secondary | ICD-10-CM | POA: Diagnosis not present

## 2021-03-27 DIAGNOSIS — M40204 Unspecified kyphosis, thoracic region: Secondary | ICD-10-CM | POA: Diagnosis not present

## 2021-03-27 DIAGNOSIS — M25511 Pain in right shoulder: Secondary | ICD-10-CM | POA: Diagnosis not present

## 2021-03-27 DIAGNOSIS — M6281 Muscle weakness (generalized): Secondary | ICD-10-CM | POA: Diagnosis not present

## 2021-04-03 ENCOUNTER — Other Ambulatory Visit (HOSPITAL_COMMUNITY): Payer: Self-pay | Admitting: Psychiatry

## 2021-04-16 DIAGNOSIS — R42 Dizziness and giddiness: Secondary | ICD-10-CM | POA: Diagnosis not present

## 2021-04-16 DIAGNOSIS — M6281 Muscle weakness (generalized): Secondary | ICD-10-CM | POA: Diagnosis not present

## 2021-04-16 DIAGNOSIS — M25611 Stiffness of right shoulder, not elsewhere classified: Secondary | ICD-10-CM | POA: Diagnosis not present

## 2021-04-16 DIAGNOSIS — M40204 Unspecified kyphosis, thoracic region: Secondary | ICD-10-CM | POA: Diagnosis not present

## 2021-04-16 DIAGNOSIS — M25511 Pain in right shoulder: Secondary | ICD-10-CM | POA: Diagnosis not present

## 2021-04-16 DIAGNOSIS — M545 Low back pain, unspecified: Secondary | ICD-10-CM | POA: Diagnosis not present

## 2021-04-16 DIAGNOSIS — R2689 Other abnormalities of gait and mobility: Secondary | ICD-10-CM | POA: Diagnosis not present

## 2021-04-16 DIAGNOSIS — R293 Abnormal posture: Secondary | ICD-10-CM | POA: Diagnosis not present

## 2021-04-20 DIAGNOSIS — E6609 Other obesity due to excess calories: Secondary | ICD-10-CM | POA: Diagnosis not present

## 2021-04-20 DIAGNOSIS — Z6835 Body mass index (BMI) 35.0-35.9, adult: Secondary | ICD-10-CM | POA: Diagnosis not present

## 2021-04-20 DIAGNOSIS — R252 Cramp and spasm: Secondary | ICD-10-CM | POA: Diagnosis not present

## 2021-04-20 DIAGNOSIS — I10 Essential (primary) hypertension: Secondary | ICD-10-CM | POA: Diagnosis not present

## 2021-04-20 DIAGNOSIS — M5136 Other intervertebral disc degeneration, lumbar region: Secondary | ICD-10-CM | POA: Diagnosis not present

## 2021-04-23 DIAGNOSIS — R293 Abnormal posture: Secondary | ICD-10-CM | POA: Diagnosis not present

## 2021-04-23 DIAGNOSIS — M25511 Pain in right shoulder: Secondary | ICD-10-CM | POA: Diagnosis not present

## 2021-04-23 DIAGNOSIS — R2689 Other abnormalities of gait and mobility: Secondary | ICD-10-CM | POA: Diagnosis not present

## 2021-04-23 DIAGNOSIS — M25611 Stiffness of right shoulder, not elsewhere classified: Secondary | ICD-10-CM | POA: Diagnosis not present

## 2021-04-23 DIAGNOSIS — R42 Dizziness and giddiness: Secondary | ICD-10-CM | POA: Diagnosis not present

## 2021-04-23 DIAGNOSIS — M40204 Unspecified kyphosis, thoracic region: Secondary | ICD-10-CM | POA: Diagnosis not present

## 2021-04-23 DIAGNOSIS — M6281 Muscle weakness (generalized): Secondary | ICD-10-CM | POA: Diagnosis not present

## 2021-04-23 DIAGNOSIS — M545 Low back pain, unspecified: Secondary | ICD-10-CM | POA: Diagnosis not present

## 2021-04-24 DIAGNOSIS — N3281 Overactive bladder: Secondary | ICD-10-CM | POA: Diagnosis not present

## 2021-04-24 DIAGNOSIS — N3946 Mixed incontinence: Secondary | ICD-10-CM | POA: Diagnosis not present

## 2021-04-29 DIAGNOSIS — Z961 Presence of intraocular lens: Secondary | ICD-10-CM | POA: Diagnosis not present

## 2021-04-29 DIAGNOSIS — H16213 Exposure keratoconjunctivitis, bilateral: Secondary | ICD-10-CM | POA: Diagnosis not present

## 2021-04-29 DIAGNOSIS — Z0100 Encounter for examination of eyes and vision without abnormal findings: Secondary | ICD-10-CM | POA: Diagnosis not present

## 2021-04-29 DIAGNOSIS — H524 Presbyopia: Secondary | ICD-10-CM | POA: Diagnosis not present

## 2021-04-29 DIAGNOSIS — H353131 Nonexudative age-related macular degeneration, bilateral, early dry stage: Secondary | ICD-10-CM | POA: Diagnosis not present

## 2021-05-12 DIAGNOSIS — E6609 Other obesity due to excess calories: Secondary | ICD-10-CM | POA: Diagnosis not present

## 2021-05-12 DIAGNOSIS — R059 Cough, unspecified: Secondary | ICD-10-CM | POA: Diagnosis not present

## 2021-05-12 DIAGNOSIS — Z6835 Body mass index (BMI) 35.0-35.9, adult: Secondary | ICD-10-CM | POA: Diagnosis not present

## 2021-05-12 DIAGNOSIS — R55 Syncope and collapse: Secondary | ICD-10-CM | POA: Diagnosis not present

## 2021-05-26 DIAGNOSIS — N3281 Overactive bladder: Secondary | ICD-10-CM | POA: Diagnosis not present

## 2021-05-26 DIAGNOSIS — N3946 Mixed incontinence: Secondary | ICD-10-CM | POA: Diagnosis not present

## 2021-06-05 ENCOUNTER — Other Ambulatory Visit (HOSPITAL_COMMUNITY): Payer: Self-pay | Admitting: Psychiatry

## 2021-06-05 NOTE — Telephone Encounter (Signed)
Call for appt

## 2021-06-17 ENCOUNTER — Other Ambulatory Visit: Payer: Self-pay

## 2021-06-17 ENCOUNTER — Encounter (HOSPITAL_COMMUNITY): Payer: Self-pay | Admitting: Psychiatry

## 2021-06-17 ENCOUNTER — Telehealth (INDEPENDENT_AMBULATORY_CARE_PROVIDER_SITE_OTHER): Payer: Medicare HMO | Admitting: Psychiatry

## 2021-06-17 DIAGNOSIS — F3130 Bipolar disorder, current episode depressed, mild or moderate severity, unspecified: Secondary | ICD-10-CM

## 2021-06-17 DIAGNOSIS — R69 Illness, unspecified: Secondary | ICD-10-CM | POA: Diagnosis not present

## 2021-06-17 MED ORDER — TRAZODONE HCL 100 MG PO TABS
100.0000 mg | ORAL_TABLET | Freq: Every day | ORAL | 2 refills | Status: DC
Start: 2021-06-17 — End: 2021-11-26

## 2021-06-17 MED ORDER — ALPRAZOLAM 1 MG PO TABS
1.0000 mg | ORAL_TABLET | Freq: Three times a day (TID) | ORAL | 2 refills | Status: DC
Start: 2021-06-17 — End: 2021-09-03

## 2021-06-17 MED ORDER — OLANZAPINE 20 MG PO TABS: 20.0000 mg | ORAL_TABLET | Freq: Every day | ORAL | 2 refills | Status: DC

## 2021-06-17 MED ORDER — FLUOXETINE HCL 40 MG PO CAPS: 40.0000 mg | ORAL_CAPSULE | Freq: Two times a day (BID) | ORAL | 2 refills | Status: DC

## 2021-06-17 NOTE — Progress Notes (Signed)
Virtual Visit via Telephone Note  I connected with Victoria Mccarthy on 06/17/21 at  1:40 PM EDT by telephone and verified that I am speaking with the correct person using two identifiers.  Location: Patient: home Provider: home office   I discussed the limitations, risks, security and privacy concerns of performing an evaluation and management service by telephone and the availability of in person appointments. I also discussed with the patient that there may be a patient responsible charge related to this service. The patient expressed understanding and agreed to proceed.     I discussed the assessment and treatment plan with the patient. The patient was provided an opportunity to ask questions and all were answered. The patient agreed with the plan and demonstrated an understanding of the instructions.   The patient was advised to call back or seek an in-person evaluation if the symptoms worsen or if the condition fails to improve as anticipated.  I provided 14 minutes of non-face-to-face time during this encounter.   Levonne Spiller, MD  [facility] MD/PA/NP OP Progress Note  06/17/2021 2:00 PM Victoria Mccarthy  MRN:  [id number]  Chief Complaint:  Chief Complaint   Anxiety; Depression; Follow-up; Manic Behavior    HPI: This patient is a 75 year old white female who lives alone in [place].  She has 2 children.  She is a retired Therapist, art.  The patient returns after 4 months regarding her bipolar disorder and anxiety.  Last time we had cut down her Xanax because she stated she was generally only using it twice a day.  However she states now in the middle of the day she gets extremely anxious and would like to go back to 3 a day.  I think this is reasonable.  She has been having falling spells but found out it was due to her blood pressure medicine.  This has been discontinued and she is generally not falling.  She is still dealing with her ex-husband who is living in one of her houses and is  basically trashed it but she is afraid to evict him.  She states that he is a convicted sex offender and can get volatile.  She mostly just stays away from home.  Overall however the patient states her mood is fairly good she denies severe depression or manic symptoms anxiety panic attacks or difficulty sleeping.  She denies thoughts of self-harm or suicidal ideation Visit Diagnosis:    ICD-10-CM   1. Bipolar I disorder, most recent episode depressed ([place])  F31.30       Past Psychiatric History: Hospitalized in the 1980s, since then outpatient treatment for bipolar disorder  Past Medical History:  Past Medical History:  Diagnosis Date   Anemia    hx   Anxiety    Arthritis    chronic back pain   Bipolar disorder ([place])    Cancer ([place]) Jan 2013   kidney cancer, left, s/p partial nephrectomy   Depression    Diarrhea    incontinent stools   Diverticulitis    treated at least 5 times in past, hospitalized twice   Heart murmur    History of hiatal hernia    s/p   MVP (mitral valve prolapse)    Neuropathy    Nocturia    Osteoarthritis    Osteoporosis    PONV (postoperative nausea and vomiting)    Psychotic affective disorder ([place])    "psychotic delusions" "I cut myself"    Restless leg syndrome    Rheumatic  fever    Urinary frequency     Past Surgical History:  Procedure Laterality Date   ABDOMINAL HYSTERECTOMY     ANTERIOR CERVICAL DECOMP/DISCECTOMY FUSION N/A 09/28/2017   Procedure: ANTERIOR CERVICAL DECOMPRESSION/DISCECTOMY FUSION CERVICAL FOUR- CERVICAL FIVE, CERVICAL FIVE- CERVICAL SIX, CERVICAL SIX- CERVICAL SEVEN;  Surgeon: Eustace Moore, MD;  Location: [place];  Service: Neurosurgery;  Laterality: N/A;  ANTERIOR CERVICAL DECOMPRESSION/DISCECTOMY FUSION CERVICAL FOUR- CERVICAL FIVE, CERVICAL FIVE- CERVICAL SIX, CERVICAL SIX- CERVICAL SEVEN   BACK SURGERY     BACK SURGERY     2 plates, 8 screws in back   carpell tunnell     rt wrist  x2   CATARACT EXTRACTION W/PHACO  Left 06/14/2016   Procedure: CATARACT EXTRACTION PHACO AND INTRAOCULAR LENS PLACEMENT; CDE:  3.92;  Surgeon: Williams Che, MD;  [facility];  Service: Ophthalmology;  Laterality: Left;   CHOLECYSTECTOMY     COLONOSCOPY  5/02   pancolonic deverticula, internal hemorrhoids   COLONOSCOPY  1/11   single external hemorrhoidal tag and anal papilla otherwise normal rectum, pancolonic diverticula, s/p sigmoid biopsy and stool sampling all unremarkable   ESOPHAGOGASTRODUODENOSCOPY  04/06/2010   intact Nissen fundoplication S/P dilation, somewhat baggy atonic appearing esophagus, 58-F dilation   ESOPHAGOGASTRODUODENOSCOPY  1/11   nomral esophagus s/p 54-F Maloney dilation, normal/intact Nissen fundoplication, diffuse patchy erythema and erosions likely NSAID/ASA effect with benign biopsy   EYE SURGERY     right eye cataract   kidney surgery for cancer     MAXIMUM ACCESS (MAS)POSTERIOR LUMBAR INTERBODY FUSION (PLIF) 1 LEVEL N/A 07/25/2014   Procedure: FOR MAXIMUM ACCESS (MAS) POSTERIOR LUMBAR INTERBODY FUSION (PLIF) Lumbar two/three, Removal of hardware Lumbar three/four;  Surgeon: Eustace Moore, MD;  [facility];  Service: Neurosurgery;  Laterality: N/A;   MAXIMUM ACCESS (MAS)POSTERIOR LUMBAR INTERBODY FUSION (PLIF) 2 LEVEL N/A 09/11/2015   Procedure: LUMBAR FOUR-FIVE LUMBAR FIVE SACRAL ONE  MAXIMUM ACCESS SURGERY, POSTERIOR LUMBAR INTERBODY FUSION , Removal of LUMBAR TWO TO LUMBAR FOUR  HARDWARE;  Surgeon: Eustace Moore, MD;  Location: [place] [facility];  Service: Neurosurgery;  Laterality: N/A;   NISSEN FUNDOPLICATION     REVERSE SHOULDER ARTHROPLASTY Right 05/28/2016   Procedure: RIGHT REVERSE SHOULDER ARTHROPLASTY;  Surgeon: Netta Cedars, MD;  Location: [place];  Service: Orthopedics;  Laterality: Right;   ROBOT ASSISTED LAPAROSCOPIC NEPHRECTOMY  08/23/2011   Procedure: ROBOTIC ASSISTED LAPAROSCOPIC NEPHRECTOMY;  Surgeon: Dutch Gray, MD;  [facility];  Service: Urology;  Laterality: Left;   Left Robotic Assisted  Laparoscopic Partial Nephrectomy    ROTATOR CUFF REPAIR     right side X 2   TONSILLECTOMY     TOTAL SHOULDER REVISION Right 08/19/2017   Procedure: RIGHT SHOULDER POLYETHYLENE EXCHANGE;  Surgeon: Netta Cedars, MD;  Location: [facility];  Service: Orthopedics;  Laterality: Right;    Family Psychiatric History: see below  Family History:  Family History  Problem Relation Age of Onset   Bipolar disorder Daughter    Colon cancer Neg Hx     Social History:  Social History   Socioeconomic History   Marital status: Divorced    Spouse name: Not on file   Number of children: Not on file   Years of education: Not on file   Highest education level: Not on file  Occupational History   Not on file  Tobacco Use   Smoking status: Never   Smokeless tobacco: Never  Substance and Sexual Activity   Alcohol use:  No   Drug use: No   Sexual activity: Not Currently  Other Topics Concern   Not on file  Social History Narrative   Not on file   Social Determinants of Health   Financial Resource Strain: Not on file  Food Insecurity: Not on file  Transportation Needs: Not on file  Physical Activity: Not on file  Stress: Not on file  Social Connections: Not on file    Allergies:  Allergies  Allergen Reactions   Shellfish Allergy Anaphylaxis   Neurontin [Gabapentin] Other (See Comments)    Unable to talk when takes medication   Chlorhexidine Gluconate Rash and Other (See Comments)    burning   Codeine Itching   Haldol [Haloperidol Lactate] Other (See Comments)    Muscle spasms   Hydromorphone Hcl Nausea And Vomiting   Latex Rash    adhesives   Propoxyphene N-Acetaminophen Nausea And Vomiting   Vicodin [Hydrocodone-Acetaminophen] Itching    Metabolic Disorder Labs: No results found for: HGBA1C, MPG No results found for: PROLACTIN No results found for: CHOL, TRIG, HDL, CHOLHDL, VLDL, LDLCALC Lab Results  Component Value Date   TSH 2.25 01/28/2016     Therapeutic Level Labs: No results found for: LITHIUM No results found for: VALPROATE No components found for:  CBMZ  Current Medications: Current Outpatient Medications  Medication Sig Dispense Refill   ALPRAZolam (XANAX) 1 MG tablet Take 1 tablet (1 mg total) by mouth 3 (three) times daily. 90 tablet 2   Ascorbic Acid (VITA-C PO) Take by mouth daily.     atorvastatin (LIPITOR) 20 MG tablet Take 20 mg by mouth daily.     Cholecalciferol (VITAMIN D3 PO) Take by mouth daily.     cholestyramine (QUESTRAN) 4 GM/DOSE powder TAKE 1 SCOOPFUL OF 4 GRAMS AND MIX WITH LIQUID OR APPLESAUCE AS DIRECTED ON PACKAGING AND TAKE BY MOUTH DAILY - DO NOT TAKE WITHIN 2 HRS OF OTHER MEDS 378 g 1   FLUoxetine (PROZAC) 40 MG capsule Take 1 capsule (40 mg total) by mouth 2 (two) times daily. 180 capsule 2   methocarbamol (ROBAXIN) 500 MG tablet Take 1 tablet (500 mg total) by mouth every 6 (six) hours as needed for muscle spasms. 30 tablet 0   Multiple Vitamin (MULTIVITAMIN WITH MINERALS) TABS Take 2 tablets by mouth every evening.      Multiple Vitamins-Minerals (HAIR/SKIN/NAILS) CAPS Take 2 tablets by mouth daily.     nitroGLYCERIN (NITROSTAT) 0.4 MG SL tablet Place 0.4 mg under the tongue every 5 (five) minutes as needed for chest pain.     OLANZapine (ZYPREXA) 20 MG tablet Take 1 tablet (20 mg total) by mouth at bedtime. 90 tablet 2   oxybutynin (DITROPAN XL) 15 MG 24 hr tablet Take 15 mg by mouth daily.  11   rOPINIRole (REQUIP) 3 MG tablet TAKE 1 TABLET (3mg ) BY MOUTH 3 TIMES DAILY.  11   tiZANidine (ZANAFLEX) 2 MG tablet Take 2 mg by mouth 3 (three) times daily.     traZODone (DESYREL) 100 MG tablet Take 1 tablet (100 mg total) by mouth at bedtime. 90 tablet 2   No current facility-administered medications for this visit.     Musculoskeletal: Strength & Muscle Tone: na Gait & Station: na Patient leans: N/A  Psychiatric Specialty Exam: Review of Systems  Musculoskeletal:  Positive for  arthralgias and back pain.  All other systems reviewed and are negative.  There were no vitals taken for this visit.There is no height or weight on  file to calculate BMI.  General Appearance: NA  Eye Contact:  NA  Speech:  Clear and Coherent  Volume:  Normal  Mood:  Euthymic  Affect:  NA  Thought Process:  Goal Directed  Orientation:  Full (Time, Place, and Person)  Thought Content: WDL   Suicidal Thoughts:  No  Homicidal Thoughts:  No  Memory:  Immediate;   Good Recent;   Good Remote;   Good  Judgement:  Good  Insight:  Good  Psychomotor Activity:  Decreased  Concentration:  Concentration: Good and Attention Span: Good  Recall:  Good  Fund of Knowledge: Good  Language: Good  Akathisia:  No  Handed:  Right  AIMS (if indicated): not done  Assets:  Communication Skills Desire for Improvement Resilience Social Support Talents/Skills  ADL's:  Intact  Cognition: WNL  Sleep:  Good   Screenings: PHQ2-9    Flowsheet Row Video Visit from 06/17/2021 in [facility] Video Visit from 01/22/2021 in [facility]  PHQ-2 Total Score 1 6  PHQ-9 Total Score -- 13      Flowsheet Row Video Visit from 06/17/2021 in [facility] [facility] Video Visit from 01/22/2021 in [facility] Error: Question 6 not populated No Risk        Assessment and Plan: This patient is a 75 year old female with a history of bipolar disorder.  Right now she seems to be stable on her current regimen although she is more anxious since we cut down the Xanax.  She will resume the Xanax 1 mg 3 times daily for anxiety.  She will continue Prozac 40 mg twice daily for depression, olanzapine 20 mg at bedtime for mood stabilization and trazodone 100 mg at bedtime for sleep.  She will return to see me in 3 months   Levonne Spiller, MD 06/17/2021,  2:00 PM

## 2021-06-25 DIAGNOSIS — N3946 Mixed incontinence: Secondary | ICD-10-CM | POA: Diagnosis not present

## 2021-06-25 DIAGNOSIS — N3281 Overactive bladder: Secondary | ICD-10-CM | POA: Diagnosis not present

## 2021-07-16 ENCOUNTER — Other Ambulatory Visit: Payer: Self-pay | Admitting: Physician Assistant

## 2021-07-16 DIAGNOSIS — Z1231 Encounter for screening mammogram for malignant neoplasm of breast: Secondary | ICD-10-CM

## 2021-07-20 ENCOUNTER — Other Ambulatory Visit: Payer: Self-pay

## 2021-07-20 ENCOUNTER — Ambulatory Visit
Admission: RE | Admit: 2021-07-20 | Discharge: 2021-07-20 | Disposition: A | Discharge status: Home / Self Care | Payer: Medicare HMO | Source: Ambulatory Visit | Attending: Physician Assistant | Admitting: Physician Assistant

## 2021-07-20 DIAGNOSIS — Z1231 Encounter for screening mammogram for malignant neoplasm of breast: Secondary | ICD-10-CM | POA: Diagnosis not present

## 2021-07-23 ENCOUNTER — Other Ambulatory Visit (HOSPITAL_COMMUNITY): Payer: Self-pay | Admitting: Physician Assistant

## 2021-07-23 DIAGNOSIS — R928 Other abnormal and inconclusive findings on diagnostic imaging of breast: Secondary | ICD-10-CM

## 2021-07-24 DIAGNOSIS — E785 Hyperlipidemia, unspecified: Secondary | ICD-10-CM | POA: Diagnosis not present

## 2021-07-24 DIAGNOSIS — Z8249 Family history of ischemic heart disease and other diseases of the circulatory system: Secondary | ICD-10-CM | POA: Diagnosis not present

## 2021-07-24 DIAGNOSIS — N3946 Mixed incontinence: Secondary | ICD-10-CM | POA: Diagnosis not present

## 2021-07-24 DIAGNOSIS — R030 Elevated blood-pressure reading, without diagnosis of hypertension: Secondary | ICD-10-CM | POA: Diagnosis not present

## 2021-07-24 DIAGNOSIS — N3281 Overactive bladder: Secondary | ICD-10-CM | POA: Diagnosis not present

## 2021-07-24 DIAGNOSIS — G2581 Restless legs syndrome: Secondary | ICD-10-CM | POA: Diagnosis not present

## 2021-07-24 DIAGNOSIS — Z9181 History of falling: Secondary | ICD-10-CM | POA: Diagnosis not present

## 2021-07-24 DIAGNOSIS — F419 Anxiety disorder, unspecified: Secondary | ICD-10-CM | POA: Diagnosis not present

## 2021-07-24 DIAGNOSIS — Z008 Encounter for other general examination: Secondary | ICD-10-CM | POA: Diagnosis not present

## 2021-07-24 DIAGNOSIS — E669 Obesity, unspecified: Secondary | ICD-10-CM | POA: Diagnosis not present

## 2021-07-24 DIAGNOSIS — R69 Illness, unspecified: Secondary | ICD-10-CM | POA: Diagnosis not present

## 2021-07-24 DIAGNOSIS — Z6833 Body mass index (BMI) 33.0-33.9, adult: Secondary | ICD-10-CM | POA: Diagnosis not present

## 2021-07-24 DIAGNOSIS — G4700 Insomnia, unspecified: Secondary | ICD-10-CM | POA: Diagnosis not present

## 2021-07-24 DIAGNOSIS — G8929 Other chronic pain: Secondary | ICD-10-CM | POA: Diagnosis not present

## 2021-07-24 DIAGNOSIS — M792 Neuralgia and neuritis, unspecified: Secondary | ICD-10-CM | POA: Diagnosis not present

## 2021-07-27 DIAGNOSIS — N3281 Overactive bladder: Secondary | ICD-10-CM | POA: Diagnosis not present

## 2021-07-27 DIAGNOSIS — N3946 Mixed incontinence: Secondary | ICD-10-CM | POA: Diagnosis not present

## 2021-07-28 DIAGNOSIS — Q398 Other congenital malformations of esophagus: Secondary | ICD-10-CM | POA: Diagnosis not present

## 2021-07-28 DIAGNOSIS — R140 Abdominal distension (gaseous): Secondary | ICD-10-CM | POA: Diagnosis not present

## 2021-07-28 DIAGNOSIS — M1711 Unilateral primary osteoarthritis, right knee: Secondary | ICD-10-CM | POA: Diagnosis not present

## 2021-07-28 DIAGNOSIS — R109 Unspecified abdominal pain: Secondary | ICD-10-CM | POA: Diagnosis not present

## 2021-07-28 DIAGNOSIS — Z6837 Body mass index (BMI) 37.0-37.9, adult: Secondary | ICD-10-CM | POA: Diagnosis not present

## 2021-07-28 DIAGNOSIS — L905 Scar conditions and fibrosis of skin: Secondary | ICD-10-CM | POA: Diagnosis not present

## 2021-07-31 DIAGNOSIS — N3281 Overactive bladder: Secondary | ICD-10-CM | POA: Diagnosis not present

## 2021-07-31 DIAGNOSIS — N3946 Mixed incontinence: Secondary | ICD-10-CM | POA: Diagnosis not present

## 2021-08-06 ENCOUNTER — Ambulatory Visit (HOSPITAL_COMMUNITY)
Admission: RE | Admit: 2021-08-06 | Discharge: 2021-08-06 | Disposition: A | Discharge status: Home / Self Care | Payer: Medicare HMO | Source: Ambulatory Visit | Attending: Physician Assistant | Admitting: Physician Assistant

## 2021-08-06 ENCOUNTER — Encounter (HOSPITAL_COMMUNITY): Payer: Self-pay

## 2021-08-06 ENCOUNTER — Other Ambulatory Visit: Payer: Self-pay

## 2021-08-06 DIAGNOSIS — R928 Other abnormal and inconclusive findings on diagnostic imaging of breast: Secondary | ICD-10-CM

## 2021-08-06 DIAGNOSIS — R922 Inconclusive mammogram: Secondary | ICD-10-CM | POA: Diagnosis not present

## 2021-08-28 ENCOUNTER — Other Ambulatory Visit: Payer: Self-pay | Admitting: Gastroenterology

## 2021-08-29 NOTE — Telephone Encounter (Signed)
Patient needs ov for refills.

## 2021-08-31 ENCOUNTER — Encounter: Payer: Self-pay | Admitting: Internal Medicine

## 2021-09-03 ENCOUNTER — Other Ambulatory Visit (HOSPITAL_COMMUNITY): Payer: Self-pay | Admitting: Psychiatry

## 2021-09-08 ENCOUNTER — Other Ambulatory Visit (HOSPITAL_COMMUNITY): Payer: Medicare HMO

## 2021-09-08 ENCOUNTER — Encounter (HOSPITAL_COMMUNITY): Payer: Medicare HMO

## 2021-09-18 DIAGNOSIS — M159 Polyosteoarthritis, unspecified: Secondary | ICD-10-CM | POA: Diagnosis not present

## 2021-09-18 DIAGNOSIS — E782 Mixed hyperlipidemia: Secondary | ICD-10-CM | POA: Diagnosis not present

## 2021-09-18 DIAGNOSIS — R69 Illness, unspecified: Secondary | ICD-10-CM | POA: Diagnosis not present

## 2021-09-28 ENCOUNTER — Encounter: Payer: Self-pay | Admitting: Internal Medicine

## 2021-09-28 ENCOUNTER — Other Ambulatory Visit: Payer: Self-pay | Admitting: Gastroenterology

## 2021-09-28 NOTE — Telephone Encounter (Signed)
I have refilled but needs office visit for further refills.

## 2021-10-29 ENCOUNTER — Other Ambulatory Visit (HOSPITAL_COMMUNITY): Payer: Self-pay | Admitting: Psychiatry

## 2021-10-29 NOTE — Telephone Encounter (Signed)
Call for appt

## 2021-11-04 ENCOUNTER — Other Ambulatory Visit (HOSPITAL_COMMUNITY): Payer: Self-pay | Admitting: Physician Assistant

## 2021-11-04 DIAGNOSIS — E2839 Other primary ovarian failure: Secondary | ICD-10-CM

## 2021-11-13 ENCOUNTER — Other Ambulatory Visit: Payer: Self-pay

## 2021-11-13 ENCOUNTER — Telehealth (HOSPITAL_COMMUNITY): Payer: Medicare HMO | Admitting: Psychiatry

## 2021-11-26 ENCOUNTER — Other Ambulatory Visit: Payer: Self-pay

## 2021-11-26 ENCOUNTER — Encounter (HOSPITAL_COMMUNITY): Payer: Self-pay | Admitting: Psychiatry

## 2021-11-26 ENCOUNTER — Telehealth (INDEPENDENT_AMBULATORY_CARE_PROVIDER_SITE_OTHER): Payer: 59 | Admitting: Psychiatry

## 2021-11-26 DIAGNOSIS — F3130 Bipolar disorder, current episode depressed, mild or moderate severity, unspecified: Secondary | ICD-10-CM

## 2021-11-26 MED ORDER — FLUOXETINE HCL 40 MG PO CAPS: 40.0000 mg | ORAL_CAPSULE | Freq: Two times a day (BID) | ORAL | 2 refills | Status: DC

## 2021-11-26 MED ORDER — TRAZODONE HCL 100 MG PO TABS: 100.0000 mg | ORAL_TABLET | Freq: Every day | ORAL | 2 refills | Status: DC

## 2021-11-26 MED ORDER — ALPRAZOLAM 1 MG PO TABS: 1.0000 mg | ORAL_TABLET | Freq: Three times a day (TID) | ORAL | 0 refills | Status: DC

## 2021-11-26 MED ORDER — OLANZAPINE 20 MG PO TABS: 20.0000 mg | ORAL_TABLET | Freq: Every day | ORAL | 2 refills | Status: DC

## 2021-11-26 NOTE — Progress Notes (Signed)
Virtual Visit via Telephone Note  I connected with Victoria Mccarthy on 11/26/21 at 10:20 AM EST by telephone and verified that I am speaking with the correct person using two identifiers.  Location: Patient: home Provider: office   I discussed the limitations, risks, security and privacy concerns of performing an evaluation and management service by telephone and the availability of in person appointments. I also discussed with the patient that there may be a patient responsible charge related to this service. The patient expressed understanding and agreed to proceed.       I discussed the assessment and treatment plan with the patient. The patient was provided an opportunity to ask questions and all were answered. The patient agreed with the plan and demonstrated an understanding of the instructions.   The patient was advised to call back or seek an in-person evaluation if the symptoms worsen or if the condition fails to improve as anticipated.  I provided 15 minutes of non-face-to-face time during this encounter.   Levonne Spiller, MD  [facility] MD/PA/NP OP Progress Note  11/26/2021 10:35 AM Victoria Mccarthy  MRN:  [id number]  Chief Complaint:  Chief Complaint  Patient presents with   Depression   Anxiety   Manic Behavior   Follow-up   HPI: This patient is a 76 year old white female who lives alone in [place].  She has 2 children.  She is a retired Therapist, art.  The patient returns after 4 months regarding her bipolar disorder and anxiety.  She states she recently fell and hit her head in the bathroom.  She was seen at an urgent care and apparently had a normal x-ray of her skull.  She states it is taking a while to heal.  She also banged her toe.  She states that her daughter is helping care for her and preparing her meals and driving her around.  She is back on hydrochlorothiazide and it makes her "swimmy headed" so she does not like to drive.  Overall her mood has been stable.  She  denies depressive or anxiety symptoms.  She is sleeping well at night.  She denies manic symptoms such as racing thoughts impulsivity or erratic behavior. Visit Diagnosis:    ICD-10-CM   1. Bipolar I disorder, most recent episode depressed ([place])  F31.30       Past Psychiatric History: Hospitalized in the 1980s since then outpatient treatment for bipolar disorder  Past Medical History:  Past Medical History:  Diagnosis Date   Anemia    hx   Anxiety    Arthritis    chronic back pain   Bipolar disorder ([place])    Cancer ([place]) Jan 2013   kidney cancer, left, s/p partial nephrectomy   Depression    Diarrhea    incontinent stools   Diverticulitis    treated at least 5 times in past, hospitalized twice   Heart murmur    History of hiatal hernia    s/p   MVP (mitral valve prolapse)    Neuropathy    Nocturia    Osteoarthritis    Osteoporosis    PONV (postoperative nausea and vomiting)    Psychotic affective disorder ([place])    "psychotic delusions" "I cut myself"    Restless leg syndrome    Rheumatic fever    Urinary frequency     Past Surgical History:  Procedure Laterality Date   ABDOMINAL HYSTERECTOMY     ANTERIOR CERVICAL DECOMP/DISCECTOMY FUSION N/A 09/28/2017   Procedure: ANTERIOR CERVICAL DECOMPRESSION/DISCECTOMY  FUSION CERVICAL FOUR- CERVICAL FIVE, CERVICAL FIVE- CERVICAL SIX, CERVICAL SIX- CERVICAL SEVEN;  Surgeon: Eustace Moore, MD;  Location: [facility];  Service: Neurosurgery;  Laterality: N/A;  ANTERIOR CERVICAL DECOMPRESSION/DISCECTOMY FUSION CERVICAL FOUR- CERVICAL FIVE, CERVICAL FIVE- CERVICAL SIX, CERVICAL SIX- CERVICAL SEVEN   BACK SURGERY     BACK SURGERY     2 plates, 8 screws in back   carpell tunnell     rt wrist  x2   CATARACT EXTRACTION W/PHACO Left 06/14/2016   Procedure: CATARACT EXTRACTION PHACO AND INTRAOCULAR LENS PLACEMENT; CDE:  3.92;  Surgeon: Williams Che, MD;  [facility];  Service: Ophthalmology;  Laterality: Left;   CHOLECYSTECTOMY      COLONOSCOPY  5/02   pancolonic deverticula, internal hemorrhoids   COLONOSCOPY  1/11   single external hemorrhoidal tag and anal papilla otherwise normal rectum, pancolonic diverticula, s/p sigmoid biopsy and stool sampling all unremarkable   ESOPHAGOGASTRODUODENOSCOPY  04/06/2010   intact Nissen fundoplication S/P dilation, somewhat baggy atonic appearing esophagus, 58-F dilation   ESOPHAGOGASTRODUODENOSCOPY  1/11   nomral esophagus s/p 54-F Maloney dilation, normal/intact Nissen fundoplication, diffuse patchy erythema and erosions likely NSAID/ASA effect with benign biopsy   EYE SURGERY     right eye cataract   kidney surgery for cancer     MAXIMUM ACCESS (MAS)POSTERIOR LUMBAR INTERBODY FUSION (PLIF) 1 LEVEL N/A 07/25/2014   Procedure: FOR MAXIMUM ACCESS (MAS) POSTERIOR LUMBAR INTERBODY FUSION (PLIF) Lumbar two/three, Removal of hardware Lumbar three/four;  Surgeon: Eustace Moore, MD;  [facility];  Service: Neurosurgery;  Laterality: N/A;   MAXIMUM ACCESS (MAS)POSTERIOR LUMBAR INTERBODY FUSION (PLIF) 2 LEVEL N/A 09/11/2015   Procedure: LUMBAR FOUR-FIVE LUMBAR FIVE SACRAL ONE  MAXIMUM ACCESS SURGERY, POSTERIOR LUMBAR INTERBODY FUSION , Removal of LUMBAR TWO TO LUMBAR FOUR  HARDWARE;  Surgeon: Eustace Moore, MD;  Location: [facility];  Service: Neurosurgery;  Laterality: N/A;   NISSEN FUNDOPLICATION     REVERSE SHOULDER ARTHROPLASTY Right 05/28/2016   Procedure: RIGHT REVERSE SHOULDER ARTHROPLASTY;  Surgeon: Netta Cedars, MD;  Location: [facility];  Service: Orthopedics;  Laterality: Right;   ROBOT ASSISTED LAPAROSCOPIC NEPHRECTOMY  08/23/2011   Procedure: ROBOTIC ASSISTED LAPAROSCOPIC NEPHRECTOMY;  Surgeon: Dutch Gray, MD;  [facility];  Service: Urology;  Laterality: Left;  Left Robotic Assisted  Laparoscopic Partial Nephrectomy    ROTATOR CUFF REPAIR     right side X 2   TONSILLECTOMY     TOTAL SHOULDER REVISION Right 08/19/2017   Procedure: RIGHT SHOULDER POLYETHYLENE EXCHANGE;   Surgeon: Netta Cedars, MD;  Location: [facility];  Service: Orthopedics;  Laterality: Right;    Family Psychiatric History: See below  Family History:  Family History  Problem Relation Age of Onset   Bipolar disorder Daughter    Colon cancer Neg Hx     Social History:  Social History   Socioeconomic History   Marital status: Divorced    Spouse name: Not on file   Number of children: Not on file   Years of education: Not on file   Highest education level: Not on file  Occupational History   Not on file  Tobacco Use   Smoking status: Never   Smokeless tobacco: Never  Substance and Sexual Activity   Alcohol use: No   Drug use: No   Sexual activity: Not Currently  Other Topics Concern   Not on file  Social History Narrative   Not on file   Social Determinants of Health  Financial Resource Strain: Not on file  Food Insecurity: Not on file  Transportation Needs: Not on file  Physical Activity: Not on file  Stress: Not on file  Social Connections: Not on file    Allergies:  Allergies  Allergen Reactions   Shellfish Allergy Anaphylaxis   Neurontin [Gabapentin] Other (See Comments)    Unable to talk when takes medication   Chlorhexidine Gluconate Rash and Other (See Comments)    burning   Codeine Itching   Haldol [Haloperidol Lactate] Other (See Comments)    Muscle spasms   Hydromorphone Hcl Nausea And Vomiting   Latex Rash    adhesives   Propoxyphene N-Acetaminophen Nausea And Vomiting   Vicodin [Hydrocodone-Acetaminophen] Itching    Metabolic Disorder Labs: No results found for: HGBA1C, MPG No results found for: PROLACTIN No results found for: CHOL, TRIG, HDL, CHOLHDL, VLDL, LDLCALC Lab Results  Component Value Date   TSH 2.25 01/28/2016    Therapeutic Level Labs: No results found for: LITHIUM No results found for: VALPROATE No components found for:  CBMZ  Current Medications: Current Outpatient Medications  Medication Sig Dispense Refill    ALPRAZolam (XANAX) 1 MG tablet Take 1 tablet (1 mg total) by mouth 3 (three) times daily. 90 tablet 0   Ascorbic Acid (VITA-C PO) Take by mouth daily.     atorvastatin (LIPITOR) 20 MG tablet Take 20 mg by mouth daily.     Cholecalciferol (VITAMIN D3 PO) Take by mouth daily.     cholestyramine (QUESTRAN) 4 GM/DOSE powder TAKE 1 SCOOPFUL OF 4 GRAMS AND MIX WITH LIQUID OR APPLESAUCE AS DIRECTED ON PACKAGING AND TAKE BY MOUTH DAILY - DO NOT TAKE WITHIN 2 HRS OF OTHER MEDS 378 g 1   FLUoxetine (PROZAC) 40 MG capsule Take 1 capsule (40 mg total) by mouth 2 (two) times daily. 180 capsule 2   hydrochlorothiazide (HYDRODIURIL) 25 MG tablet Take 25 mg by mouth daily.     methocarbamol (ROBAXIN) 500 MG tablet Take 1 tablet (500 mg total) by mouth every 6 (six) hours as needed for muscle spasms. 30 tablet 0   Multiple Vitamin (MULTIVITAMIN WITH MINERALS) TABS Take 2 tablets by mouth every evening.      Multiple Vitamins-Minerals (HAIR/SKIN/NAILS) CAPS Take 2 tablets by mouth daily.     nitroGLYCERIN (NITROSTAT) 0.4 MG SL tablet Place 0.4 mg under the tongue every 5 (five) minutes as needed for chest pain.     OLANZapine (ZYPREXA) 20 MG tablet Take 1 tablet (20 mg total) by mouth at bedtime. 90 tablet 2   oxybutynin (DITROPAN XL) 15 MG 24 hr tablet Take 15 mg by mouth daily.  11   rOPINIRole (REQUIP) 3 MG tablet TAKE 1 TABLET ('3mg'$ ) BY MOUTH 3 TIMES DAILY.  11   tiZANidine (ZANAFLEX) 2 MG tablet Take 2 mg by mouth 3 (three) times daily.     traZODone (DESYREL) 100 MG tablet Take 1 tablet (100 mg total) by mouth at bedtime. 90 tablet 2   No current facility-administered medications for this visit.     Musculoskeletal: Strength & Muscle Tone: na Gait & Station: na Patient leans: N/A  Psychiatric Specialty Exam: Review of Systems  Musculoskeletal:  Positive for back pain, gait problem and neck pain.  Neurological:  Positive for light-headedness and headaches.  All other systems reviewed and are  negative.  There were no vitals taken for this visit.There is no height or weight on file to calculate BMI.  General Appearance: NA  Eye Contact:  NA  Speech:  Clear and Coherent  Volume:  Normal  Mood:  Euthymic  Affect:  Appropriate and Congruent  Thought Process:  Goal Directed  Orientation:  Full (Time, Place, and Person)  Thought Content: WDL   Suicidal Thoughts:  No  Homicidal Thoughts:  No  Memory:  Immediate;   Good Recent;   Good Remote;   Good  Judgement:  Good  Insight:  Fair  Psychomotor Activity:  Decreased  Concentration:  Concentration: Good and Attention Span: Good  Recall:  Good  Fund of Knowledge: Good  Language: Good  Akathisia:  No  Handed:  Right  AIMS (if indicated): not done  Assets:  Communication Skills Desire for Improvement Resilience Social Support Talents/Skills  ADL's:  Intact  Cognition: WNL  Sleep:  Good   Screenings: PHQ2-9    Flowsheet Row Video Visit from 11/26/2021 in [facility] Video Visit from 06/17/2021 in [facility] Video Visit from 01/22/2021 in [facility]  PHQ-2 Total Score '1 1 6  '$ PHQ-9 Total Score -- -- 13      Flowsheet Row Video Visit from 11/26/2021 in [facility] Video Visit from 06/17/2021 in [facility] Video Visit from 01/22/2021 in [facility] No Risk Error: Question 6 not populated No Risk        Assessment and Plan: This patient is a 76 year old female with a history of bipolar disorder.  She is doing well on her current regimen.  She will continue Xanax 1 mg 3 times daily for anxiety, Prozac 40 mg twice daily for depression, olanzapine 20 mg at bedtime for mood stabilization and trazodone 100 mg at bedtime for sleep.  She will return to see me  in 3 months  Collaboration of Care: Collaboration of Care: Primary Care Provider AEB chart notes will be released to PCP at patient's request  Patient/Guardian was advised Release of Information must be obtained prior to any record release in order to collaborate their care with an outside provider. Patient/Guardian was advised if they have not already done so to contact the registration department to sign all necessary forms in order for [place] to release information regarding their care.   Consent: Patient/Guardian gives verbal consent for treatment and assignment of benefits for services provided during this visit. Patient/Guardian expressed understanding and agreed to proceed.    Levonne Spiller, MD 11/26/2021, 10:35 AM

## 2022-01-04 ENCOUNTER — Other Ambulatory Visit (HOSPITAL_COMMUNITY): Payer: Self-pay | Admitting: Psychiatry

## 2022-01-04 ENCOUNTER — Telehealth (HOSPITAL_COMMUNITY): Payer: Self-pay

## 2022-01-04 MED ORDER — ALPRAZOLAM 1 MG PO TABS: 1.0000 mg | ORAL_TABLET | Freq: Three times a day (TID) | ORAL | 2 refills | Status: DC

## 2022-01-04 NOTE — Telephone Encounter (Signed)
Medication refill - Fax from [facility] for a refill of patient's prescribed Alprazolam, last ordered 11/26/21 when patient last seen for a 30 day supply.  Patient does not return until 02/23/22.  ?

## 2022-01-04 NOTE — Telephone Encounter (Signed)
sent 

## 2022-02-23 ENCOUNTER — Telehealth (INDEPENDENT_AMBULATORY_CARE_PROVIDER_SITE_OTHER): Payer: 59 | Admitting: Psychiatry

## 2022-02-23 ENCOUNTER — Encounter (HOSPITAL_COMMUNITY): Payer: Self-pay | Admitting: Psychiatry

## 2022-02-23 DIAGNOSIS — F3130 Bipolar disorder, current episode depressed, mild or moderate severity, unspecified: Secondary | ICD-10-CM | POA: Diagnosis not present

## 2022-02-23 MED ORDER — TRAZODONE HCL 100 MG PO TABS
100.0000 mg | ORAL_TABLET | Freq: Every day | ORAL | 2 refills | Status: DC
Start: 2022-02-23 — End: 2022-06-08

## 2022-02-23 MED ORDER — FLUOXETINE HCL 40 MG PO CAPS: 40.0000 mg | ORAL_CAPSULE | Freq: Two times a day (BID) | ORAL | 2 refills | Status: DC

## 2022-02-23 MED ORDER — OLANZAPINE 20 MG PO TABS: 20.0000 mg | ORAL_TABLET | Freq: Every day | ORAL | 2 refills | Status: DC

## 2022-02-23 MED ORDER — ALPRAZOLAM 1 MG PO TABS: 1.0000 mg | ORAL_TABLET | Freq: Three times a day (TID) | ORAL | 2 refills | Status: DC

## 2022-02-23 NOTE — Progress Notes (Signed)
Virtual Visit via Telephone Note  I connected with Victoria Mccarthy on 02/23/22 at  2:40 PM EDT by telephone and verified that I am speaking with the correct person using two identifiers.  Location: Patient: home Provider: office   I discussed the limitations, risks, security and privacy concerns of performing an evaluation and management service by telephone and the availability of in person appointments. I also discussed with the patient that there may be a patient responsible charge related to this service. The patient expressed understanding and agreed to proceed.     I discussed the assessment and treatment plan with the patient. The patient was provided an opportunity to ask questions and all were answered. The patient agreed with the plan and demonstrated an understanding of the instructions.   The patient was advised to call back or seek an in-person evaluation if the symptoms worsen or if the condition fails to improve as anticipated.  I provided 15 minutes of non-face-to-face time during this encounter.   Levonne Spiller, MD  [facility] MD/PA/NP OP Progress Note  02/23/2022 2:48 PM Victoria Mccarthy  MRN:  [id number]  Chief Complaint:  Chief Complaint  Patient presents with   Depression   Anxiety   Manic Behavior   Follow-up   HPI: This patient is a 76 year old white female who lives alone in [place].  She has 2 children.  She is a retired Therapist, art.  The patient returns for follow-up after 3 months regarding her bipolar disorder and anxiety.  She had a fall last time but has had no further falls.  She is struggling a lot with back pain.  She is currently not on any pain medication but does take tizanidine only at bedtime.  Her mood has been good and she denies significant depression or manic symptoms.  She is sleeping well and her anxiety is under good control.  She denies racing thoughts or erratic behavior or impulsivity. Visit Diagnosis:    ICD-10-CM   1. Bipolar I disorder,  most recent episode depressed ([place])  F31.30       Past Psychiatric History: Hospitalized in the 1980s, since then outpatient treatment for bipolar disorder  Past Medical History:  Past Medical History:  Diagnosis Date   Anemia    hx   Anxiety    Arthritis    chronic back pain   Bipolar disorder ([place])    Cancer ([place]) Jan 2013   kidney cancer, left, s/p partial nephrectomy   Depression    Diarrhea    incontinent stools   Diverticulitis    treated at least 5 times in past, hospitalized twice   Heart murmur    History of hiatal hernia    s/p   MVP (mitral valve prolapse)    Neuropathy    Nocturia    Osteoarthritis    Osteoporosis    PONV (postoperative nausea and vomiting)    Psychotic affective disorder ([place])    "psychotic delusions" "I cut myself"    Restless leg syndrome    Rheumatic fever    Urinary frequency     Past Surgical History:  Procedure Laterality Date   ABDOMINAL HYSTERECTOMY     ANTERIOR CERVICAL DECOMP/DISCECTOMY FUSION N/A 09/28/2017   Procedure: ANTERIOR CERVICAL DECOMPRESSION/DISCECTOMY FUSION CERVICAL FOUR- CERVICAL FIVE, CERVICAL FIVE- CERVICAL SIX, CERVICAL SIX- CERVICAL SEVEN;  Surgeon: Eustace Moore, MD;  Location: [facility];  Service: Neurosurgery;  Laterality: N/A;  ANTERIOR CERVICAL DECOMPRESSION/DISCECTOMY FUSION CERVICAL FOUR- CERVICAL FIVE, CERVICAL FIVE- CERVICAL SIX, CERVICAL  SIX- CERVICAL SEVEN   BACK SURGERY     BACK SURGERY     2 plates, 8 screws in back   carpell tunnell     rt wrist  x2   CATARACT EXTRACTION W/PHACO Left 06/14/2016   Procedure: CATARACT EXTRACTION PHACO AND INTRAOCULAR LENS PLACEMENT; CDE:  3.92;  Surgeon: Williams Che, MD;  [facility];  Service: Ophthalmology;  Laterality: Left;   CHOLECYSTECTOMY     COLONOSCOPY  5/02   pancolonic deverticula, internal hemorrhoids   COLONOSCOPY  1/11   single external hemorrhoidal tag and anal papilla otherwise normal rectum, pancolonic diverticula, s/p sigmoid biopsy and  stool sampling all unremarkable   ESOPHAGOGASTRODUODENOSCOPY  04/06/2010   intact Nissen fundoplication S/P dilation, somewhat baggy atonic appearing esophagus, 58-F dilation   ESOPHAGOGASTRODUODENOSCOPY  1/11   nomral esophagus s/p 54-F Maloney dilation, normal/intact Nissen fundoplication, diffuse patchy erythema and erosions likely NSAID/ASA effect with benign biopsy   EYE SURGERY     right eye cataract   kidney surgery for cancer     MAXIMUM ACCESS (MAS)POSTERIOR LUMBAR INTERBODY FUSION (PLIF) 1 LEVEL N/A 07/25/2014   Procedure: FOR MAXIMUM ACCESS (MAS) POSTERIOR LUMBAR INTERBODY FUSION (PLIF) Lumbar two/three, Removal of hardware Lumbar three/four;  Surgeon: Eustace Moore, MD;  [facility];  Service: Neurosurgery;  Laterality: N/A;   MAXIMUM ACCESS (MAS)POSTERIOR LUMBAR INTERBODY FUSION (PLIF) 2 LEVEL N/A 09/11/2015   Procedure: LUMBAR FOUR-FIVE LUMBAR FIVE SACRAL ONE  MAXIMUM ACCESS SURGERY, POSTERIOR LUMBAR INTERBODY FUSION , Removal of LUMBAR TWO TO LUMBAR FOUR  HARDWARE;  Surgeon: Eustace Moore, MD;  Location: [facility];  Service: Neurosurgery;  Laterality: N/A;   NISSEN FUNDOPLICATION     REVERSE SHOULDER ARTHROPLASTY Right 05/28/2016   Procedure: RIGHT REVERSE SHOULDER ARTHROPLASTY;  Surgeon: Netta Cedars, MD;  Location: [facility];  Service: Orthopedics;  Laterality: Right;   ROBOT ASSISTED LAPAROSCOPIC NEPHRECTOMY  08/23/2011   Procedure: ROBOTIC ASSISTED LAPAROSCOPIC NEPHRECTOMY;  Surgeon: [place] Gray, MD;  [facility];  Service: Urology;  Laterality: Left;  Left Robotic Assisted  Laparoscopic Partial Nephrectomy    ROTATOR CUFF REPAIR     right side X 2   TONSILLECTOMY     TOTAL SHOULDER REVISION Right 08/19/2017   Procedure: RIGHT SHOULDER POLYETHYLENE EXCHANGE;  Surgeon: Netta Cedars, MD;  Location: [facility];  Service: Orthopedics;  Laterality: Right;    Family Psychiatric History: see below  Family History:  Family History  Problem Relation Age of Onset   Bipolar  disorder Daughter    Colon cancer Neg Hx     Social History:  Social History   Socioeconomic History   Marital status: Divorced    Spouse name: Not on file   Number of children: Not on file   Years of education: Not on file   Highest education level: Not on file  Occupational History   Not on file  Tobacco Use   Smoking status: Never   Smokeless tobacco: Never  Substance and Sexual Activity   Alcohol use: No   Drug use: No   Sexual activity: Not Currently  Other Topics Concern   Not on file  Social History Narrative   Not on file   Social Determinants of Health   Financial Resource Strain: Not on file  Food Insecurity: Not on file  Transportation Needs: Not on file  Physical Activity: Not on file  Stress: Not on file  Social Connections: Not on file    Allergies:  Allergies  Allergen Reactions   Shellfish Allergy Anaphylaxis   Neurontin [Gabapentin] Other (See Comments)    Unable to talk when takes medication   Chlorhexidine Gluconate Rash and Other (See Comments)    burning   Codeine Itching   Haldol [Haloperidol Lactate] Other (See Comments)    Muscle spasms   Hydromorphone Hcl Nausea And Vomiting   Latex Rash    adhesives   Propoxyphene N-Acetaminophen Nausea And Vomiting   Vicodin [Hydrocodone-Acetaminophen] Itching    Metabolic Disorder Labs: No results found for: HGBA1C, MPG No results found for: PROLACTIN No results found for: CHOL, TRIG, HDL, CHOLHDL, VLDL, LDLCALC Lab Results  Component Value Date   TSH 2.25 01/28/2016    Therapeutic Level Labs: No results found for: LITHIUM No results found for: VALPROATE No components found for:  CBMZ  Current Medications: Current Outpatient Medications  Medication Sig Dispense Refill   ALPRAZolam (XANAX) 1 MG tablet Take 1 tablet (1 mg total) by mouth 3 (three) times daily. 90 tablet 2   Ascorbic Acid (VITA-C PO) Take by mouth daily.     atorvastatin (LIPITOR) 20 MG tablet Take 20 mg by mouth  daily.     Cholecalciferol (VITAMIN D3 PO) Take by mouth daily.     cholestyramine (QUESTRAN) 4 GM/DOSE powder TAKE 1 SCOOPFUL OF 4 GRAMS AND MIX WITH LIQUID OR APPLESAUCE AS DIRECTED ON PACKAGING AND TAKE BY MOUTH DAILY - DO NOT TAKE WITHIN 2 HRS OF OTHER MEDS 378 g 1   FLUoxetine (PROZAC) 40 MG capsule Take 1 capsule (40 mg total) by mouth 2 (two) times daily. 180 capsule 2   hydrochlorothiazide (HYDRODIURIL) 25 MG tablet Take 25 mg by mouth daily.     Multiple Vitamin (MULTIVITAMIN WITH MINERALS) TABS Take 2 tablets by mouth every evening.      Multiple Vitamins-Minerals (HAIR/SKIN/NAILS) CAPS Take 2 tablets by mouth daily.     nitroGLYCERIN (NITROSTAT) 0.4 MG SL tablet Place 0.4 mg under the tongue every 5 (five) minutes as needed for chest pain.     OLANZapine (ZYPREXA) 20 MG tablet Take 1 tablet (20 mg total) by mouth at bedtime. 90 tablet 2   oxybutynin (DITROPAN XL) 15 MG 24 hr tablet Take 15 mg by mouth daily.  11   rOPINIRole (REQUIP) 3 MG tablet TAKE 1 TABLET ('3mg'$ ) BY MOUTH 3 TIMES DAILY.  11   tiZANidine (ZANAFLEX) 2 MG tablet Take 2 mg by mouth 3 (three) times daily.     traZODone (DESYREL) 100 MG tablet Take 1 tablet (100 mg total) by mouth at bedtime. 90 tablet 2   No current facility-administered medications for this visit.     Musculoskeletal: Strength & Muscle Tone: na Gait & Station: na Patient leans: N/A  Psychiatric Specialty Exam: Review of Systems  Musculoskeletal:  Positive for back pain.  All other systems reviewed and are negative.  There were no vitals taken for this visit.There is no height or weight on file to calculate BMI.  General Appearance: NA  Eye Contact:  NA  Speech:  NA  Volume:  Normal  Mood:  Euthymic  Affect:  NA  Thought Process:  Goal Directed  Orientation:  Full (Time, Place, and Person)  Thought Content: WDL   Suicidal Thoughts:  No  Homicidal Thoughts:  No  Memory:  Immediate;   Good Recent;   Good Remote;   Good  Judgement:   Good  Insight:  Fair  Psychomotor Activity:  Decreased  Concentration:  Concentration: Good and Attention Span:  Good  Recall:  Good  Fund of Knowledge: Good  Language: Good  Akathisia:  No  Handed:  Right  AIMS (if indicated): not done  Assets:  Communication Skills Desire for Improvement Resilience Social Support Talents/Skills  ADL's:  Intact  Cognition: WNL  Sleep:  Good   Screenings: PHQ2-9    Flowsheet Row Video Visit from 02/23/2022 in [place] Video Visit from 11/26/2021 in [facility] Video Visit from 06/17/2021 in [facility] Video Visit from 01/22/2021 in [facility]  PHQ-2 Total Score 0 '1 1 6  '$ PHQ-9 Total Score -- -- -- 13      Flowsheet Row Video Visit from 02/23/2022 in [facility] Video Visit from 11/26/2021 in [facility] Video Visit from 06/17/2021 in [place] No Risk No Risk Error: Question 6 not populated        Assessment and Plan: This patient is a 76 year old female with a history of bipolar disorder.  She continues to do well on her current regimen.  She will continue Xanax 1 mg 3 times daily for anxiety, Prozac 40 mg twice daily for depression, olanzapine 20 mg at bedtime for mood stabilization and trazodone 100 mg at bedtime for sleep.  She will return to see me in 3 months  Collaboration of Care: Collaboration of Care: Primary Care Provider AEB notes will be released to PCP at patient's request  Patient/Guardian was advised Release of Information must be obtained prior to any record release in order to collaborate their care with an outside provider. Patient/Guardian was advised if they have not already done so to contact the registration  department to sign all necessary forms in order for [place] to release information regarding their care.   Consent: Patient/Guardian gives verbal consent for treatment and assignment of benefits for services provided during this visit. Patient/Guardian expressed understanding and agreed to proceed.    Levonne Spiller, MD 02/23/2022, 2:48 PM

## 2022-06-08 ENCOUNTER — Telehealth (INDEPENDENT_AMBULATORY_CARE_PROVIDER_SITE_OTHER): Payer: Medicare Other | Admitting: Psychiatry

## 2022-06-08 ENCOUNTER — Encounter (HOSPITAL_COMMUNITY): Payer: Self-pay | Admitting: Psychiatry

## 2022-06-08 DIAGNOSIS — F3130 Bipolar disorder, current episode depressed, mild or moderate severity, unspecified: Secondary | ICD-10-CM

## 2022-06-08 MED ORDER — OLANZAPINE 20 MG PO TABS: 20.0000 mg | ORAL_TABLET | Freq: Every day | ORAL | 2 refills | Status: DC

## 2022-06-08 MED ORDER — ALPRAZOLAM 1 MG PO TABS
1.0000 mg | ORAL_TABLET | Freq: Two times a day (BID) | ORAL | 2 refills | Status: DC
Start: 2022-06-08 — End: 2022-08-30

## 2022-06-08 MED ORDER — FLUOXETINE HCL 40 MG PO CAPS
40.0000 mg | ORAL_CAPSULE | Freq: Two times a day (BID) | ORAL | 2 refills | Status: DC
Start: 2022-06-08 — End: 2023-01-20

## 2022-06-08 MED ORDER — TRAZODONE HCL 100 MG PO TABS
100.0000 mg | ORAL_TABLET | Freq: Every day | ORAL | 2 refills | Status: DC
Start: 2022-06-08 — End: 2023-01-20

## 2022-06-08 NOTE — Progress Notes (Signed)
Virtual Visit via Telephone Note  I connected with Victoria Mccarthy on 06/08/22 at 11:20 AM EDT by telephone and verified that I am speaking with the correct person using two identifiers.  Location: Patient: home Provider: office   I discussed the limitations, risks, security and privacy concerns of performing an evaluation and management service by telephone and the availability of in person appointments. I also discussed with the patient that there may be a patient responsible charge related to this service. The patient expressed understanding and agreed to proceed.     I discussed the assessment and treatment plan with the patient. The patient was provided an opportunity to ask questions and all were answered. The patient agreed with the plan and demonstrated an understanding of the instructions.   The patient was advised to call back or seek an in-person evaluation if the symptoms worsen or if the condition fails to improve as anticipated.  I provided 20 minutes of non-face-to-face time during this encounter.   Levonne Spiller, MD  [facility] MD/PA/NP OP Progress Note  06/08/2022 11:34 AM Victoria Mccarthy  MRN:  [id number]  Chief Complaint:  Chief Complaint  Patient presents with   Depression   Manic Behavior   Anxiety   Follow-up   HPI: This patient is a 76 year old white female who lives alone in [place].  She has 2 children.  She is a retired Therapist, art.  The patient returns for follow-up after 3 months regarding her bipolar disorder.  She states that she had a "manic episode" last week.  She states that she had forgotten to take her medications for 3 days.  She could not find her TV remote and angered her to the point of severe agitation.  She states that she went off and was screaming and yelling for about 2 hours and her daughter was trying to calm her down.  They finally found the remote and her daughter found her medication in the kitchen and made her to restart it.  She is  feeling fine now and denies any other episodes like this before or since.  She denies auditory visual hallucinations paranoia delusions problems sleeping or racing thoughts.  She realizes now that she cannot go off medications.  She denies other problems with memory.  She is generally sleeping well and her anxiety is under good control. Visit Diagnosis:   Past Psychiatric History: Hospitalized in the 1980s, since then outpatient treatment for bipolar disorder  Past Medical History:  Past Medical History:  Diagnosis Date   Anemia    hx   Anxiety    Arthritis    chronic back pain   Bipolar disorder ([place])    Cancer ([place]) Jan 2013   kidney cancer, left, s/p partial nephrectomy   Depression    Diarrhea    incontinent stools   Diverticulitis    treated at least 5 times in past, hospitalized twice   Heart murmur    History of hiatal hernia    s/p   MVP (mitral valve prolapse)    Neuropathy    Nocturia    Osteoarthritis    Osteoporosis    PONV (postoperative nausea and vomiting)    Psychotic affective disorder ([place])    "psychotic delusions" "I cut myself"    Restless leg syndrome    Rheumatic fever    Urinary frequency     Past Surgical History:  Procedure Laterality Date   ABDOMINAL HYSTERECTOMY     ANTERIOR CERVICAL DECOMP/DISCECTOMY FUSION N/A 09/28/2017  Procedure: ANTERIOR CERVICAL DECOMPRESSION/DISCECTOMY FUSION CERVICAL FOUR- CERVICAL FIVE, CERVICAL FIVE- CERVICAL SIX, CERVICAL SIX- CERVICAL SEVEN;  Surgeon: Eustace Moore, MD;  Location: [facility];  Service: Neurosurgery;  Laterality: N/A;  ANTERIOR CERVICAL DECOMPRESSION/DISCECTOMY FUSION CERVICAL FOUR- CERVICAL FIVE, CERVICAL FIVE- CERVICAL SIX, CERVICAL SIX- CERVICAL SEVEN   BACK SURGERY     BACK SURGERY     2 plates, 8 screws in back   carpell tunnell     rt wrist  x2   CATARACT EXTRACTION W/PHACO Left 06/14/2016   Procedure: CATARACT EXTRACTION PHACO AND INTRAOCULAR LENS PLACEMENT; CDE:  3.92;  Surgeon: Williams Che,  MD;  Location: [facility];  Service: Ophthalmology;  Laterality: Left;   CHOLECYSTECTOMY     COLONOSCOPY  5/02   pancolonic deverticula, internal hemorrhoids   COLONOSCOPY  1/11   single external hemorrhoidal tag and anal papilla otherwise normal rectum, pancolonic diverticula, s/p sigmoid biopsy and stool sampling all unremarkable   ESOPHAGOGASTRODUODENOSCOPY  04/06/2010   intact Nissen fundoplication S/P dilation, somewhat baggy atonic appearing esophagus, 58-F dilation   ESOPHAGOGASTRODUODENOSCOPY  1/11   nomral esophagus s/p 54-F Maloney dilation, normal/intact Nissen fundoplication, diffuse patchy erythema and erosions likely NSAID/ASA effect with benign biopsy   EYE SURGERY     right eye cataract   kidney surgery for cancer     MAXIMUM ACCESS (MAS)POSTERIOR LUMBAR INTERBODY FUSION (PLIF) 1 LEVEL N/A 07/25/2014   Procedure: FOR MAXIMUM ACCESS (MAS) POSTERIOR LUMBAR INTERBODY FUSION (PLIF) Lumbar two/three, Removal of hardware Lumbar three/four;  Surgeon: Eustace Moore, MD;  Location: [facility];  Service: Neurosurgery;  Laterality: N/A;   MAXIMUM ACCESS (MAS)POSTERIOR LUMBAR INTERBODY FUSION (PLIF) 2 LEVEL N/A 09/11/2015   Procedure: LUMBAR FOUR-FIVE LUMBAR FIVE SACRAL ONE  MAXIMUM ACCESS SURGERY, POSTERIOR LUMBAR INTERBODY FUSION , Removal of LUMBAR TWO TO LUMBAR FOUR  HARDWARE;  Surgeon: Eustace Moore, MD;  Location: [facility];  Service: Neurosurgery;  Laterality: N/A;   NISSEN FUNDOPLICATION     REVERSE SHOULDER ARTHROPLASTY Right 05/28/2016   Procedure: RIGHT REVERSE SHOULDER ARTHROPLASTY;  Surgeon: Netta Cedars, MD;  Location: [facility];  Service: Orthopedics;  Laterality: Right;   ROBOT ASSISTED LAPAROSCOPIC NEPHRECTOMY  08/23/2011   Procedure: ROBOTIC ASSISTED LAPAROSCOPIC NEPHRECTOMY;  Surgeon: Dutch Gray, MD;  Location: [facility];  Service: Urology;  Laterality: Left;  Left Robotic Assisted  Laparoscopic Partial Nephrectomy    ROTATOR CUFF REPAIR     right side X 2   TONSILLECTOMY      TOTAL SHOULDER REVISION Right 08/19/2017   Procedure: RIGHT SHOULDER POLYETHYLENE EXCHANGE;  Surgeon: Netta Cedars, MD;  Location: [facility];  Service: Orthopedics;  Laterality: Right;    Family Psychiatric History: See below  Family History:  Family History  Problem Relation Age of Onset   Bipolar disorder Daughter    Colon cancer Neg Hx     Social History:  Social History   Socioeconomic History   Marital status: Divorced    Spouse name: Not on file   Number of children: Not on file   Years of education: Not on file   Highest education level: Not on file  Occupational History   Not on file  Tobacco Use   Smoking status: Never   Smokeless tobacco: Never  Substance and Sexual Activity   Alcohol use: No   Drug use: No   Sexual activity: Not Currently  Other Topics Concern   Not on file  Social History Narrative   Not on file   Social  Determinants of Health   Financial Resource Strain: Not on file  Food Insecurity: Not on file  Transportation Needs: Not on file  Physical Activity: Not on file  Stress: Not on file  Social Connections: Not on file    Allergies:  Allergies  Allergen Reactions   Shellfish Allergy Anaphylaxis   Neurontin [Gabapentin] Other (See Comments)    Unable to talk when takes medication   Chlorhexidine Gluconate Rash and Other (See Comments)    burning   Codeine Itching   Haldol [Haloperidol Lactate] Other (See Comments)    Muscle spasms   Hydromorphone Hcl Nausea And Vomiting   Latex Rash    adhesives   Propoxyphene N-Acetaminophen Nausea And Vomiting   Vicodin [Hydrocodone-Acetaminophen] Itching    Metabolic Disorder Labs: No results found for: "HGBA1C", "MPG" No results found for: "PROLACTIN" No results found for: "CHOL", "TRIG", "HDL", "CHOLHDL", "VLDL", "LDLCALC" Lab Results  Component Value Date   TSH 2.25 01/28/2016    Therapeutic Level Labs: No results found for: "LITHIUM" No results found for: "VALPROATE" No results  found for: "CBMZ"  Current Medications: Current Outpatient Medications  Medication Sig Dispense Refill   ALPRAZolam (XANAX) 1 MG tablet Take 1 tablet (1 mg total) by mouth 2 (two) times daily. 60 tablet 2   Ascorbic Acid (VITA-C PO) Take by mouth daily.     atorvastatin (LIPITOR) 20 MG tablet Take 20 mg by mouth daily.     Cholecalciferol (VITAMIN D3 PO) Take by mouth daily.     cholestyramine (QUESTRAN) 4 GM/DOSE powder TAKE 1 SCOOPFUL OF 4 GRAMS AND MIX WITH LIQUID OR APPLESAUCE AS DIRECTED ON PACKAGING AND TAKE BY MOUTH DAILY - DO NOT TAKE WITHIN 2 HRS OF OTHER MEDS 378 g 1   FLUoxetine (PROZAC) 40 MG capsule Take 1 capsule (40 mg total) by mouth 2 (two) times daily. 180 capsule 2   hydrochlorothiazide (HYDRODIURIL) 25 MG tablet Take 25 mg by mouth daily.     Multiple Vitamin (MULTIVITAMIN WITH MINERALS) TABS Take 2 tablets by mouth every evening.      Multiple Vitamins-Minerals (HAIR/SKIN/NAILS) CAPS Take 2 tablets by mouth daily.     nitroGLYCERIN (NITROSTAT) 0.4 MG SL tablet Place 0.4 mg under the tongue every 5 (five) minutes as needed for chest pain.     OLANZapine (ZYPREXA) 20 MG tablet Take 1 tablet (20 mg total) by mouth at bedtime. 90 tablet 2   oxybutynin (DITROPAN XL) 15 MG 24 hr tablet Take 15 mg by mouth daily.  11   rOPINIRole (REQUIP) 3 MG tablet TAKE 1 TABLET ('3mg'$ ) BY MOUTH 3 TIMES DAILY.  11   tiZANidine (ZANAFLEX) 2 MG tablet Take 2 mg by mouth 3 (three) times daily.     traZODone (DESYREL) 100 MG tablet Take 1 tablet (100 mg total) by mouth at bedtime. 90 tablet 2   No current facility-administered medications for this visit.     Musculoskeletal: Strength & Muscle Tone: na Gait & Station: na Patient leans: N/A  Psychiatric Specialty Exam: Review of Systems  Psychiatric/Behavioral:  Positive for agitation.   All other systems reviewed and are negative.   There were no vitals taken for this visit.There is no height or weight on file to calculate BMI.  General  Appearance: NA  Eye Contact:  NA  Speech:  Clear and Coherent  Volume:  Normal  Mood:  Euthymic  Affect:  NA  Thought Process:  Goal Directed  Orientation:  Full (Time, Place, and Person)  Thought  Content: WDL   Suicidal Thoughts:  No  Homicidal Thoughts:  No  Memory:  Immediate;   Good Recent;   Good Remote;   NA  Judgement:  Fair  Insight:  Fair  Psychomotor Activity:  Normal  Concentration:  Concentration: Fair and Attention Span: Fair  Recall:  [place] of Knowledge: Good  Language: Good  Akathisia:  No  Handed:  Right  AIMS (if indicated): not done  Assets:  Communication Skills Desire for Improvement Resilience Social Support  ADL's:  Intact  Cognition: WNL  Sleep:  Good   Screenings: PHQ2-9    Flowsheet Row Video Visit from 06/08/2022 in [facility] Video Visit from 02/23/2022 in [facility] Video Visit from 11/26/2021 in [facility] Video Visit from 06/17/2021 in [facility] Video Visit from 01/22/2021 in [facility]  PHQ-2 Total Score 1 0 '1 1 6  '$ PHQ-9 Total Score -- -- -- -- 13      Flowsheet Row Video Visit from 06/08/2022 in [facility] Video Visit from 02/23/2022 in [facility] Video Visit from 11/26/2021 in [facility] No Risk No Risk No Risk        Assessment and Plan: This patient is a 76 year old female with a history of bipolar disorder.  She did not do well when she forgot to take her medication but is seems to be okay now.  She will continue Xanax 1 mg twice daily for anxiety, Prozac 40 mg twice daily for depression, olanzapine 20 mg at bedtime for mood stabilization and trazodone 100 mg at bedtime for  sleep.  She states that her daughter is handling her medications now.  She will return to see me in 3 months  Collaboration of Care: Collaboration of Care: Other provider involved in patient's care AEB notes will be released to PCP at patient's request  Patient/Guardian was advised Release of Information must be obtained prior to any record release in order to collaborate their care with an outside provider. Patient/Guardian was advised if they have not already done so to contact the registration department to sign all necessary forms in order for [place] to release information regarding their care.   Consent: Patient/Guardian gives verbal consent for treatment and assignment of benefits for services provided during this visit. Patient/Guardian expressed understanding and agreed to proceed.    Levonne Spiller, MD 06/08/2022, 11:34 AM

## 2022-08-29 ENCOUNTER — Other Ambulatory Visit (HOSPITAL_COMMUNITY): Payer: Self-pay | Admitting: Psychiatry

## 2022-11-23 DIAGNOSIS — I10 Essential (primary) hypertension: Secondary | ICD-10-CM | POA: Diagnosis not present

## 2022-11-23 DIAGNOSIS — M6281 Muscle weakness (generalized): Secondary | ICD-10-CM | POA: Diagnosis not present

## 2022-11-23 DIAGNOSIS — R2689 Other abnormalities of gait and mobility: Secondary | ICD-10-CM | POA: Diagnosis not present

## 2022-11-23 DIAGNOSIS — N3281 Overactive bladder: Secondary | ICD-10-CM | POA: Diagnosis not present

## 2022-11-27 ENCOUNTER — Other Ambulatory Visit (HOSPITAL_COMMUNITY): Payer: Self-pay | Admitting: Psychiatry

## 2022-11-29 NOTE — Telephone Encounter (Signed)
Call for appt

## 2022-12-26 ENCOUNTER — Other Ambulatory Visit (HOSPITAL_COMMUNITY): Payer: Self-pay | Admitting: Psychiatry

## 2022-12-27 DIAGNOSIS — H6123 Impacted cerumen, bilateral: Secondary | ICD-10-CM | POA: Diagnosis not present

## 2022-12-27 DIAGNOSIS — Z0000 Encounter for general adult medical examination without abnormal findings: Secondary | ICD-10-CM | POA: Diagnosis not present

## 2022-12-27 DIAGNOSIS — I10 Essential (primary) hypertension: Secondary | ICD-10-CM | POA: Diagnosis not present

## 2022-12-27 DIAGNOSIS — G2581 Restless legs syndrome: Secondary | ICD-10-CM | POA: Diagnosis not present

## 2022-12-27 DIAGNOSIS — E782 Mixed hyperlipidemia: Secondary | ICD-10-CM | POA: Diagnosis not present

## 2023-01-06 ENCOUNTER — Telehealth (HOSPITAL_COMMUNITY): Payer: Self-pay | Admitting: *Deleted

## 2023-01-06 NOTE — Telephone Encounter (Signed)
Patient and her daughter was informed by the pharmacist and the hospital that at patient's age, patient is not suppose to be on Zyprexa and Requip and the Trazodone. Per pt daughter they stopped the Zyprexa due to what the pharmacist said. Per pt daughter patient has been a little depressed lately. Per pt daughter patient is still taking the fluoxetine. Per pt daughter she just wanted provider to know that she was in the hospital with a UTI that affected her memory.

## 2023-01-07 NOTE — Telephone Encounter (Signed)
She should NOT stop Zyprexa. Please set up appt to discuss

## 2023-01-07 NOTE — Telephone Encounter (Signed)
LMOM

## 2023-01-19 DIAGNOSIS — N3281 Overactive bladder: Secondary | ICD-10-CM | POA: Diagnosis not present

## 2023-01-19 DIAGNOSIS — M6281 Muscle weakness (generalized): Secondary | ICD-10-CM | POA: Diagnosis not present

## 2023-01-19 DIAGNOSIS — I10 Essential (primary) hypertension: Secondary | ICD-10-CM | POA: Diagnosis not present

## 2023-01-19 DIAGNOSIS — R2689 Other abnormalities of gait and mobility: Secondary | ICD-10-CM | POA: Diagnosis not present

## 2023-01-20 ENCOUNTER — Encounter (HOSPITAL_COMMUNITY): Payer: Self-pay | Admitting: Psychiatry

## 2023-01-20 ENCOUNTER — Telehealth (INDEPENDENT_AMBULATORY_CARE_PROVIDER_SITE_OTHER): Payer: 59 | Admitting: Psychiatry

## 2023-01-20 DIAGNOSIS — F3130 Bipolar disorder, current episode depressed, mild or moderate severity, unspecified: Secondary | ICD-10-CM | POA: Diagnosis not present

## 2023-01-20 MED ORDER — ALPRAZOLAM 1 MG PO TABS: 1.0000 mg | ORAL_TABLET | Freq: Two times a day (BID) | ORAL | 2 refills | Status: DC

## 2023-01-20 MED ORDER — FLUOXETINE HCL 40 MG PO CAPS: 40.0000 mg | ORAL_CAPSULE | Freq: Every day | ORAL | 2 refills | Status: DC

## 2023-01-20 MED ORDER — OLANZAPINE 10 MG PO TABS: 10.0000 mg | ORAL_TABLET | Freq: Every day | ORAL | 2 refills | Status: DC

## 2023-01-20 MED ORDER — TRAZODONE HCL 100 MG PO TABS: 100.0000 mg | ORAL_TABLET | Freq: Every day | ORAL | 2 refills | Status: DC

## 2023-01-20 NOTE — Progress Notes (Signed)
BH MD/PA/NP OP Progress Note  01/20/2023 1:29 PM Victoria Mccarthy  MRN:  [id number]  Chief Complaint:  Chief Complaint  Patient presents with   Anxiety   Depression   Follow-up   Manic Behavior   HPI: This patient is a 77 year old white female who lives with her daughter in [place].  She has 2 children.  She is a retired Psychologist, counselling.  The patient returns for follow-up after about 7 months.  She is now living with her daughter.  In December she was admitted to [facility] for altered mental status which was caused by urinary tract infection that progressed to sepsis.  She was very confused and after a week in the hospital she was transferred to a nursing home for PT and OT.  Since then she has been living with her daughter.  The daughter states that her short-term memory is "shot."  She forgets to do things even if they are right in front of her.  She does see Dr. Bradly Bienenstock for medical care and mention it to him but he thought it was a byproduct of the UTI.  She had a brain CT done in the hospital which showed white matter disease and volume loss.  It does sound like she is entering dementia and probably had been for quite some time.  The daughter tells me that the pharmacist at [facility] told her not to take the olanzapine because it "make her memory worse."  They had stopped it for the last 4 weeks.  Her memory has not changed.  I explained that pharmacist cannot tell people whether or not they should take medicines and this needs to be directed by a physician.  Since the patient has had severe mania in the past as well as severe depression she needs to be on a mood stabilizer.  I suggested we go back to the olanzapine at a lower dose.  Right now she is denying auditory visual hallucinations delusions depression problems sleeping or racing thoughts.  She is sleeping well. Visit Diagnosis:    ICD-10-CM   1. Bipolar I disorder, most recent episode depressed (HCC)  F31.30       Past Psychiatric  History: Past hospitalization in the 1980s, since then outpatient treatment for bipolar disorder  Past Medical History:  Past Medical History:  Diagnosis Date   Anemia    hx   Anxiety    Arthritis    chronic back pain   Bipolar disorder (HCC)    Cancer (HCC) Jan 2013   kidney cancer, left, s/p partial nephrectomy   Depression    Diarrhea    incontinent stools   Diverticulitis    treated at least 5 times in past, hospitalized twice   Heart murmur    History of hiatal hernia    s/p   MVP (mitral valve prolapse)    Neuropathy    Nocturia    Osteoarthritis    Osteoporosis    PONV (postoperative nausea and vomiting)    Psychotic affective disorder (HCC)    "psychotic delusions" "I cut myself"    Restless leg syndrome    Rheumatic fever    Urinary frequency     Past Surgical History:  Procedure Laterality Date   ABDOMINAL HYSTERECTOMY     ANTERIOR CERVICAL DECOMP/DISCECTOMY FUSION N/A 09/28/2017   Procedure: ANTERIOR CERVICAL DECOMPRESSION/DISCECTOMY FUSION CERVICAL FOUR- CERVICAL FIVE, CERVICAL FIVE- CERVICAL SIX, CERVICAL SIX- CERVICAL SEVEN;  Surgeon: Tia Alert, MD;  Location: [facility] OR;  Service: Neurosurgery;  Laterality: N/A;  ANTERIOR CERVICAL DECOMPRESSION/DISCECTOMY FUSION CERVICAL FOUR- CERVICAL FIVE, CERVICAL FIVE- CERVICAL SIX, CERVICAL SIX- CERVICAL SEVEN   BACK SURGERY     BACK SURGERY     2 plates, 8 screws in back   carpell tunnell     rt wrist  x2   CATARACT EXTRACTION W/PHACO Left 06/14/2016   Procedure: CATARACT EXTRACTION PHACO AND INTRAOCULAR LENS PLACEMENT; CDE:  3.92;  Surgeon: Susa Simmonds, MD;  Location: [facility];  Service: Ophthalmology;  Laterality: Left;   CHOLECYSTECTOMY     COLONOSCOPY  5/02   pancolonic deverticula, internal hemorrhoids   COLONOSCOPY  1/11   single external hemorrhoidal tag and anal papilla otherwise normal rectum, pancolonic diverticula, s/p sigmoid biopsy and stool sampling all unremarkable   ESOPHAGOGASTRODUODENOSCOPY   04/06/2010   intact Nissen fundoplication S/P dilation, somewhat baggy atonic appearing esophagus, 58-F dilation   ESOPHAGOGASTRODUODENOSCOPY  1/11   nomral esophagus s/p 54-F Maloney dilation, normal/intact Nissen fundoplication, diffuse patchy erythema and erosions likely NSAID/ASA effect with benign biopsy   EYE SURGERY     right eye cataract   kidney surgery for cancer     MAXIMUM ACCESS (MAS)POSTERIOR LUMBAR INTERBODY FUSION (PLIF) 1 LEVEL N/A 07/25/2014   Procedure: FOR MAXIMUM ACCESS (MAS) POSTERIOR LUMBAR INTERBODY FUSION (PLIF) Lumbar two/three, Removal of hardware Lumbar three/four;  Surgeon: Tia Alert, MD;  Location: [facility];  Service: Neurosurgery;  Laterality: N/A;   MAXIMUM ACCESS (MAS)POSTERIOR LUMBAR INTERBODY FUSION (PLIF) 2 LEVEL N/A 09/11/2015   Procedure: LUMBAR FOUR-FIVE LUMBAR FIVE SACRAL ONE  MAXIMUM ACCESS SURGERY, POSTERIOR LUMBAR INTERBODY FUSION , Removal of LUMBAR TWO TO LUMBAR FOUR  HARDWARE;  Surgeon: Tia Alert, MD;  Location: [facility];  Service: Neurosurgery;  Laterality: N/A;   NISSEN FUNDOPLICATION     REVERSE SHOULDER ARTHROPLASTY Right 05/28/2016   Procedure: RIGHT REVERSE SHOULDER ARTHROPLASTY;  Surgeon: Beverely Low, MD;  Location: [facility] OR;  Service: Orthopedics;  Laterality: Right;   ROBOT ASSISTED LAPAROSCOPIC NEPHRECTOMY  08/23/2011   Procedure: ROBOTIC ASSISTED LAPAROSCOPIC NEPHRECTOMY;  Surgeon: Crecencio Mc, MD;  Location: [facility];  Service: Urology;  Laterality: Left;  Left Robotic Assisted  Laparoscopic Partial Nephrectomy    ROTATOR CUFF REPAIR     right side X 2   TONSILLECTOMY     TOTAL SHOULDER REVISION Right 08/19/2017   Procedure: RIGHT SHOULDER POLYETHYLENE EXCHANGE;  Surgeon: Beverely Low, MD;  Location: [facility] OR;  Service: Orthopedics;  Laterality: Right;    Family Psychiatric History: See below  Family History:  Family History  Problem Relation Age of Onset   Bipolar disorder Daughter    Colon cancer Neg Hx     Social  History:  Social History   Socioeconomic History   Marital status: Divorced    Spouse name: Not on file   Number of children: Not on file   Years of education: Not on file   Highest education level: Not on file  Occupational History   Not on file  Tobacco Use   Smoking status: Never   Smokeless tobacco: Never  Substance and Sexual Activity   Alcohol use: No   Drug use: No   Sexual activity: Not Currently  Other Topics Concern   Not on file  Social History Narrative   Not on file   Social Determinants of Health   Financial Resource Strain: Not on file  Food Insecurity: Not on file  Transportation Needs: Not on file  Physical Activity: Not on file  Stress:  Not on file  Social Connections: Not on file    Allergies:  Allergies  Allergen Reactions   Shellfish Allergy Anaphylaxis   Neurontin [Gabapentin] Other (See Comments)    Unable to talk when takes medication   Chlorhexidine Gluconate Rash and Other (See Comments)    burning   Codeine Itching   Haldol [Haloperidol Lactate] Other (See Comments)    Muscle spasms   Hydromorphone Hcl Nausea And Vomiting   Latex Rash    adhesives   Propoxyphene N-Acetaminophen Nausea And Vomiting   Vicodin [Hydrocodone-Acetaminophen] Itching    Metabolic Disorder Labs: No results found for: "HGBA1C", "MPG" No results found for: "PROLACTIN" No results found for: "CHOL", "TRIG", "HDL", "CHOLHDL", "VLDL", "LDLCALC" Lab Results  Component Value Date   TSH 2.25 01/28/2016    Therapeutic Level Labs: No results found for: "LITHIUM" No results found for: "VALPROATE" No results found for: "CBMZ"  Current Medications: Current Outpatient Medications  Medication Sig Dispense Refill   gabapentin (NEURONTIN) 100 MG capsule Take 100 mg by mouth at bedtime.     OLANZapine (ZYPREXA) 10 MG tablet Take 1 tablet (10 mg total) by mouth at bedtime. 30 tablet 2   ALPRAZolam (XANAX) 1 MG tablet Take 1 tablet (1 mg total) by mouth 2 (two) times  daily. 60 tablet 2   Ascorbic Acid (VITA-C PO) Take by mouth daily.     atorvastatin (LIPITOR) 20 MG tablet Take 20 mg by mouth daily.     Cholecalciferol (VITAMIN D3 PO) Take by mouth daily.     cholestyramine (QUESTRAN) 4 GM/DOSE powder TAKE 1 SCOOPFUL OF 4 GRAMS AND MIX WITH LIQUID OR APPLESAUCE AS DIRECTED ON PACKAGING AND TAKE BY MOUTH DAILY - DO NOT TAKE WITHIN 2 HRS OF OTHER MEDS 378 g 1   FLUoxetine (PROZAC) 40 MG capsule Take 1 capsule (40 mg total) by mouth daily. 90 capsule 2   hydrochlorothiazide (HYDRODIURIL) 25 MG tablet Take 25 mg by mouth daily.     Multiple Vitamin (MULTIVITAMIN WITH MINERALS) TABS Take 2 tablets by mouth every evening.      Multiple Vitamins-Minerals (HAIR/SKIN/NAILS) CAPS Take 2 tablets by mouth daily.     nitroGLYCERIN (NITROSTAT) 0.4 MG SL tablet Place 0.4 mg under the tongue every 5 (five) minutes as needed for chest pain.     oxybutynin (DITROPAN XL) 15 MG 24 hr tablet Take 15 mg by mouth daily.  11   rOPINIRole (REQUIP) 3 MG tablet TAKE 1 TABLET (3mg ) BY MOUTH 3 TIMES DAILY.  11   tiZANidine (ZANAFLEX) 2 MG tablet Take 2 mg by mouth 3 (three) times daily.     traZODone (DESYREL) 100 MG tablet Take 1 tablet (100 mg total) by mouth at bedtime. 90 tablet 2   No current facility-administered medications for this visit.     Musculoskeletal: Strength & Muscle Tone: na Gait & Station: na Patient leans: N/A  Psychiatric Specialty Exam: Review of Systems  Psychiatric/Behavioral:  Positive for confusion.   All other systems reviewed and are negative.   There were no vitals taken for this visit.There is no height or weight on file to calculate BMI.  General Appearance: NA  Eye Contact:  NA  Speech:  Clear and Coherent  Volume:  Normal  Mood:  Euthymic  Affect:  NA  Thought Process:  Goal Directed  Orientation:  Full (Time, Place, and Person)  Thought Content: WDL   Suicidal Thoughts:  No  Homicidal Thoughts:  No  Memory:  Immediate;  Poor Recent;   Poor Remote;   NA  Judgement:  Impaired  Insight:  Lacking  Psychomotor Activity:  Normal  Concentration:  Concentration: Poor and Attention Span: Poor  Recall:  Poor  Fund of Knowledge: Fair  Language: Good  Akathisia:  No  Handed:  Right  AIMS (if indicated): not done  Assets:  Communication Skills Desire for Improvement Resilience Social Support  ADL's:  Intact  Cognition: Impaired,  Mild  Sleep:  Good   Screenings: PHQ2-9    Flowsheet Row Video Visit from 06/08/2022 in [facility] at [place] Video Visit from 02/23/2022 in [facility] at [place] Video Visit from 11/26/2021 in [facility] at [place] Video Visit from 06/17/2021 in [facility] at [place] Video Visit from 01/22/2021 in [facility] at [facility] Total Score 1 0 1 1 6   PHQ-9 Total Score -- -- -- -- 13      Flowsheet Row Video Visit from 06/08/2022 in [facility] at [place] Video Visit from 02/23/2022 in [facility] at [place] Video Visit from 11/26/2021 in [facility] at [place]  C-SSRS RISK CATEGORY No Risk No Risk No Risk        Assessment and Plan:  This patient is a 77 year old female with a history of bipolar disorder.  Given her brain CT I would guess that she has been declining in terms of cognitive function and memory for quite some time but it was worsened by the sepsis.  She does have a significant history of bipolar disorder so she needs to stay on some form of mood stabilizer perhaps at a lower dosage.  We will reinstate olanzapine at only 10 mg at bedtime.  She will continue Xanax 1 mg twice daily for anxiety Prozac 40 mg daily for depression and trazodone 100 mg at bedtime for sleep.  She will return to see me in 2 months Collaboration of  Care: Collaboration of Care: Primary Care Provider AEB notes will be shared with PCP at patient's request  Patient/Guardian was advised Release of Information must be obtained prior to any record release in order to collaborate their care with an outside provider. Patient/Guardian was advised if they have not already done so to contact the registration department to sign all necessary forms in order for [place] to release information regarding their care.   Consent: Patient/Guardian gives verbal consent for treatment and assignment of benefits for services provided during this visit. Patient/Guardian expressed understanding and agreed to proceed.    Diannia Ruder, MD 01/20/2023, 1:29 PM

## 2023-02-17 DIAGNOSIS — I10 Essential (primary) hypertension: Secondary | ICD-10-CM | POA: Diagnosis not present

## 2023-02-17 DIAGNOSIS — M71161 Other infective bursitis, right knee: Secondary | ICD-10-CM | POA: Diagnosis not present

## 2023-02-23 DIAGNOSIS — N3281 Overactive bladder: Secondary | ICD-10-CM | POA: Diagnosis not present

## 2023-02-23 DIAGNOSIS — M6281 Muscle weakness (generalized): Secondary | ICD-10-CM | POA: Diagnosis not present

## 2023-02-23 DIAGNOSIS — R2689 Other abnormalities of gait and mobility: Secondary | ICD-10-CM | POA: Diagnosis not present

## 2023-02-23 DIAGNOSIS — I10 Essential (primary) hypertension: Secondary | ICD-10-CM | POA: Diagnosis not present

## 2023-03-22 ENCOUNTER — Telehealth (HOSPITAL_COMMUNITY): Payer: 59 | Admitting: Psychiatry

## 2023-03-25 DIAGNOSIS — N3281 Overactive bladder: Secondary | ICD-10-CM | POA: Diagnosis not present

## 2023-03-25 DIAGNOSIS — R2689 Other abnormalities of gait and mobility: Secondary | ICD-10-CM | POA: Diagnosis not present

## 2023-03-25 DIAGNOSIS — I10 Essential (primary) hypertension: Secondary | ICD-10-CM | POA: Diagnosis not present

## 2023-03-25 DIAGNOSIS — M6281 Muscle weakness (generalized): Secondary | ICD-10-CM | POA: Diagnosis not present

## 2023-03-27 ENCOUNTER — Other Ambulatory Visit (HOSPITAL_COMMUNITY): Payer: Self-pay | Admitting: Psychiatry

## 2023-03-28 ENCOUNTER — Telehealth (HOSPITAL_COMMUNITY): Payer: Self-pay

## 2023-03-28 DIAGNOSIS — G3184 Mild cognitive impairment, so stated: Secondary | ICD-10-CM | POA: Diagnosis not present

## 2023-03-28 DIAGNOSIS — H6123 Impacted cerumen, bilateral: Secondary | ICD-10-CM | POA: Diagnosis not present

## 2023-03-28 DIAGNOSIS — G2581 Restless legs syndrome: Secondary | ICD-10-CM | POA: Diagnosis not present

## 2023-03-28 DIAGNOSIS — Z0000 Encounter for general adult medical examination without abnormal findings: Secondary | ICD-10-CM | POA: Diagnosis not present

## 2023-03-28 DIAGNOSIS — E782 Mixed hyperlipidemia: Secondary | ICD-10-CM | POA: Diagnosis not present

## 2023-03-28 DIAGNOSIS — I10 Essential (primary) hypertension: Secondary | ICD-10-CM | POA: Diagnosis not present

## 2023-03-28 DIAGNOSIS — M71161 Other infective bursitis, right knee: Secondary | ICD-10-CM | POA: Diagnosis not present

## 2023-03-28 DIAGNOSIS — E559 Vitamin D deficiency, unspecified: Secondary | ICD-10-CM | POA: Diagnosis not present

## 2023-03-28 NOTE — Telephone Encounter (Signed)
Call for appt, missed last one

## 2023-03-28 NOTE — Telephone Encounter (Signed)
Pill pack sent in a refill request for Alprazolam 1 MG, I tried contacting pt to confirm number unavailable.

## 2023-04-04 NOTE — Telephone Encounter (Signed)
Called number on file to schedule f/u with provider and pre-recording stated call can not be completed at this time.

## 2023-04-04 NOTE — Telephone Encounter (Signed)
Called patient to schedule f/u with provider and it stated that call can not be completed at this time. Staff not able to schedule f/u appt

## 2023-04-11 DIAGNOSIS — Z1231 Encounter for screening mammogram for malignant neoplasm of breast: Secondary | ICD-10-CM | POA: Diagnosis not present

## 2023-04-25 DIAGNOSIS — R2689 Other abnormalities of gait and mobility: Secondary | ICD-10-CM | POA: Diagnosis not present

## 2023-04-25 DIAGNOSIS — I10 Essential (primary) hypertension: Secondary | ICD-10-CM | POA: Diagnosis not present

## 2023-04-25 DIAGNOSIS — N3281 Overactive bladder: Secondary | ICD-10-CM | POA: Diagnosis not present

## 2023-04-25 DIAGNOSIS — M6281 Muscle weakness (generalized): Secondary | ICD-10-CM | POA: Diagnosis not present

## 2023-05-21 ENCOUNTER — Other Ambulatory Visit (HOSPITAL_COMMUNITY): Payer: Self-pay | Admitting: Psychiatry

## 2023-05-24 ENCOUNTER — Other Ambulatory Visit (HOSPITAL_COMMUNITY): Payer: Self-pay | Admitting: Psychiatry

## 2023-05-24 MED ORDER — FLUOXETINE HCL 40 MG PO CAPS: 40.0000 mg | ORAL_CAPSULE | Freq: Every day | ORAL | 2 refills | Status: DC

## 2023-05-24 MED ORDER — OLANZAPINE 10 MG PO TABS: 10.0000 mg | ORAL_TABLET | Freq: Every day | ORAL | 2 refills | Status: DC

## 2023-05-24 MED ORDER — TRAZODONE HCL 100 MG PO TABS: 100.0000 mg | ORAL_TABLET | Freq: Every day | ORAL | 2 refills | Status: DC

## 2023-05-24 MED ORDER — ALPRAZOLAM 1 MG PO TABS: 1.0000 mg | ORAL_TABLET | Freq: Two times a day (BID) | ORAL | 1 refills | Status: DC

## 2023-05-26 ENCOUNTER — Encounter (HOSPITAL_COMMUNITY): Payer: Self-pay | Admitting: Psychiatry

## 2023-05-26 ENCOUNTER — Telehealth (INDEPENDENT_AMBULATORY_CARE_PROVIDER_SITE_OTHER): Payer: 59 | Admitting: Psychiatry

## 2023-05-26 DIAGNOSIS — R2689 Other abnormalities of gait and mobility: Secondary | ICD-10-CM | POA: Diagnosis not present

## 2023-05-26 DIAGNOSIS — M6281 Muscle weakness (generalized): Secondary | ICD-10-CM | POA: Diagnosis not present

## 2023-05-26 DIAGNOSIS — I10 Essential (primary) hypertension: Secondary | ICD-10-CM | POA: Diagnosis not present

## 2023-05-26 DIAGNOSIS — F3130 Bipolar disorder, current episode depressed, mild or moderate severity, unspecified: Secondary | ICD-10-CM

## 2023-05-26 DIAGNOSIS — N3281 Overactive bladder: Secondary | ICD-10-CM | POA: Diagnosis not present

## 2023-05-26 MED ORDER — OLANZAPINE 10 MG PO TABS: 10.0000 mg | ORAL_TABLET | Freq: Every day | ORAL | 2 refills | Status: DC

## 2023-05-26 MED ORDER — TRAZODONE HCL 100 MG PO TABS: 100.0000 mg | ORAL_TABLET | Freq: Every day | ORAL | 2 refills | Status: DC

## 2023-05-26 MED ORDER — FLUOXETINE HCL 40 MG PO CAPS: 40.0000 mg | ORAL_CAPSULE | Freq: Every day | ORAL | 2 refills | Status: DC

## 2023-05-26 MED ORDER — ALPRAZOLAM 1 MG PO TABS: 1.0000 mg | ORAL_TABLET | Freq: Two times a day (BID) | ORAL | 1 refills | Status: DC

## 2023-05-26 NOTE — Progress Notes (Signed)
Virtual Visit via Telephone Note  I connected with Victoria Mccarthy on 05/26/23 at  2:00 PM EDT by telephone and verified that I am speaking with the correct person using two identifiers.  Location: Patient: home Provider: office   I discussed the limitations, risks, security and privacy concerns of performing an evaluation and management service by telephone and the availability of in person appointments. I also discussed with the patient that there may be a patient responsible charge related to this service. The patient expressed understanding and agreed to proceed.      I discussed the assessment and treatment plan with the patient. The patient was provided an opportunity to ask questions and all were answered. The patient agreed with the plan and demonstrated an understanding of the instructions.   The patient was advised to call back or seek an in-person evaluation if the symptoms worsen or if the condition fails to improve as anticipated.  I provided 20 minutes of non-face-to-face time during this encounter.   Victoria Ruder, MD  [facility] MD/PA/NP OP Progress Note  05/26/2023 2:18 PM [name]  MRN:  [id number]  Chief Complaint:  Chief Complaint  Patient presents with   Anxiety   Depression   Follow-up   HPI: This patient is a 77 year old white female who lives with her daughter in [place].  She has 2 children.  She is a retired Psychologist, counselling.  The patient returns for follow-up after 4 months regarding her bipolar disorder and memory loss.  Last time I saw her shortly after she had been admitted to [facility] for altered mental status.  Her brain CT showed ischemic white matter diffuse disease and volume loss.  She has been having a lot of short-term memory issues.  Fortunately the patient is well cared for by her daughter.  The daughter states that she is more anxious because the daughter herself has been having some health issues that has the patient worried.  I suggested  they break the Xanax in half so she can take 0.5 mg 4 times a day instead of 1 mg twice a day.  She is sleeping well most of the time.  Last time I cut down the olanzapine because of possible oversedation but they never got the lower dose so we will try this again now.  Of course if she gets manic symptoms they are to let me know right away.  Currently she denies auditory visual hallucinations delusions or depression or racing thoughts. Visit Diagnosis:    ICD-10-CM   1. Bipolar I disorder, most recent episode depressed (HCC)  F31.30       Past Psychiatric History: Psychiatric hospitalization in the 1980s, since then outpatient treatment for bipolar disorder  Past Medical History:  Past Medical History:  Diagnosis Date   Anemia    hx   Anxiety    Arthritis    chronic back pain   Bipolar disorder (HCC)    Cancer (HCC) Jan 2013   kidney cancer, left, s/p partial nephrectomy   Depression    Diarrhea    incontinent stools   Diverticulitis    treated at least 5 times in past, hospitalized twice   Heart murmur    History of hiatal hernia    s/p   MVP (mitral valve prolapse)    Neuropathy    Nocturia    Osteoarthritis    Osteoporosis    PONV (postoperative nausea and vomiting)    Psychotic affective disorder (HCC)    "  psychotic delusions" "I cut myself"    Restless leg syndrome    Rheumatic fever    Urinary frequency     Past Surgical History:  Procedure Laterality Date   ABDOMINAL HYSTERECTOMY     ANTERIOR CERVICAL DECOMP/DISCECTOMY FUSION N/A 09/28/2017   Procedure: ANTERIOR CERVICAL DECOMPRESSION/DISCECTOMY FUSION CERVICAL FOUR- CERVICAL FIVE, CERVICAL FIVE- CERVICAL SIX, CERVICAL SIX- CERVICAL SEVEN;  Surgeon: Tia Alert, MD;  Location: [facility] OR;  Service: Neurosurgery;  Laterality: N/A;  ANTERIOR CERVICAL DECOMPRESSION/DISCECTOMY FUSION CERVICAL FOUR- CERVICAL FIVE, CERVICAL FIVE- CERVICAL SIX, CERVICAL SIX- CERVICAL SEVEN   BACK SURGERY     BACK SURGERY     2 plates, 8  screws in back   carpell tunnell     rt wrist  x2   CATARACT EXTRACTION W/PHACO Left 06/14/2016   Procedure: CATARACT EXTRACTION PHACO AND INTRAOCULAR LENS PLACEMENT; CDE:  3.92;  Surgeon: Susa Simmonds, MD;  Location: [facility];  Service: Ophthalmology;  Laterality: Left;   CHOLECYSTECTOMY     COLONOSCOPY  5/02   pancolonic deverticula, internal hemorrhoids   COLONOSCOPY  1/11   single external hemorrhoidal tag and anal papilla otherwise normal rectum, pancolonic diverticula, s/p sigmoid biopsy and stool sampling all unremarkable   ESOPHAGOGASTRODUODENOSCOPY  04/06/2010   intact Nissen fundoplication S/P dilation, somewhat baggy atonic appearing esophagus, 58-F dilation   ESOPHAGOGASTRODUODENOSCOPY  1/11   nomral esophagus s/p 54-F Maloney dilation, normal/intact Nissen fundoplication, diffuse patchy erythema and erosions likely NSAID/ASA effect with benign biopsy   EYE SURGERY     right eye cataract   kidney surgery for cancer     MAXIMUM ACCESS (MAS)POSTERIOR LUMBAR INTERBODY FUSION (PLIF) 1 LEVEL N/A 07/25/2014   Procedure: FOR MAXIMUM ACCESS (MAS) POSTERIOR LUMBAR INTERBODY FUSION (PLIF) Lumbar two/three, Removal of hardware Lumbar three/four;  Surgeon: Tia Alert, MD;  Location: [facility];  Service: Neurosurgery;  Laterality: N/A;   MAXIMUM ACCESS (MAS)POSTERIOR LUMBAR INTERBODY FUSION (PLIF) 2 LEVEL N/A 09/11/2015   Procedure: LUMBAR FOUR-FIVE LUMBAR FIVE SACRAL ONE  MAXIMUM ACCESS SURGERY, POSTERIOR LUMBAR INTERBODY FUSION , Removal of LUMBAR TWO TO LUMBAR FOUR  HARDWARE;  Surgeon: Tia Alert, MD;  Location: [facility];  Service: Neurosurgery;  Laterality: N/A;   NISSEN FUNDOPLICATION     REVERSE SHOULDER ARTHROPLASTY Right 05/28/2016   Procedure: RIGHT REVERSE SHOULDER ARTHROPLASTY;  Surgeon: Beverely Low, MD;  Location: [facility] OR;  Service: Orthopedics;  Laterality: Right;   ROBOT ASSISTED LAPAROSCOPIC NEPHRECTOMY  08/23/2011   Procedure: ROBOTIC ASSISTED LAPAROSCOPIC NEPHRECTOMY;   Surgeon: Crecencio Mc, MD;  Location: [facility];  Service: Urology;  Laterality: Left;  Left Robotic Assisted  Laparoscopic Partial Nephrectomy    ROTATOR CUFF REPAIR     right side X 2   TONSILLECTOMY     TOTAL SHOULDER REVISION Right 08/19/2017   Procedure: RIGHT SHOULDER POLYETHYLENE EXCHANGE;  Surgeon: Beverely Low, MD;  Location: [facility] OR;  Service: Orthopedics;  Laterality: Right;    Family Psychiatric History: See below  Family History:  Family History  Problem Relation Age of Onset   Bipolar disorder Daughter    Colon cancer Neg Hx     Social History:  Social History   Socioeconomic History   Marital status: Divorced    Spouse name: Not on file   Number of children: Not on file   Years of education: Not on file   Highest education level: Not on file  Occupational History   Not on file  Tobacco Use   Smoking status:  Never   Smokeless tobacco: Never  Substance and Sexual Activity   Alcohol use: No   Drug use: No   Sexual activity: Not Currently  Other Topics Concern   Not on file  Social History Narrative   Not on file   Social Determinants of Health   Financial Resource Strain: Not on file  Food Insecurity: No Food Insecurity (09/02/2022)   Received from [facility] [place], [facility]   Hunger Vital Sign    Worried About Running Out of Food in the Last Year: Never true    Ran Out of Food in the Last Year: Never true  Transportation Needs: No Transportation Needs (09/02/2022)   [facility] from [facility]   [facility] - Transportation    Lack of Transportation (Medical): No    Lack of Transportation (Non-Medical): No  Physical Activity: Not on file  Stress: Not on file  Social Connections: Not on file    Allergies:  Allergies  Allergen Reactions   Shellfish Allergy Anaphylaxis   Neurontin [Gabapentin] Other (See Comments)    Unable to talk when takes medication   Chlorhexidine Gluconate Rash and Other (See Comments)    burning    Codeine Itching   Haldol [Haloperidol Lactate] Other (See Comments)    Muscle spasms   Hydromorphone Hcl Nausea And Vomiting   Latex Rash    adhesives   Propoxyphene N-Acetaminophen Nausea And Vomiting   Vicodin [Hydrocodone-Acetaminophen] Itching    Metabolic Disorder Labs: No results found for: "HGBA1C", "MPG" No results found for: "PROLACTIN" No results found for: "CHOL", "TRIG", "HDL", "CHOLHDL", "VLDL", "LDLCALC" Lab Results  Component Value Date   TSH 2.25 01/28/2016    Therapeutic Level Labs: No results found for: "LITHIUM" No results found for: "VALPROATE" No results found for: "CBMZ"  Current Medications: Current Outpatient Medications  Medication Sig Dispense Refill   ALPRAZolam (XANAX) 1 MG tablet Take 1 tablet (1 mg total) by mouth 2 (two) times daily. 60 tablet 1   Ascorbic Acid (VITA-C PO) Take by mouth daily.     atorvastatin (LIPITOR) 20 MG tablet Take 20 mg by mouth daily.     Cholecalciferol (VITAMIN D3 PO) Take by mouth daily.     cholestyramine (QUESTRAN) 4 GM/DOSE powder TAKE 1 SCOOPFUL OF 4 GRAMS AND MIX WITH LIQUID OR APPLESAUCE AS DIRECTED ON PACKAGING AND TAKE BY MOUTH DAILY - DO NOT TAKE WITHIN 2 HRS OF OTHER MEDS 378 g 1   FLUoxetine (PROZAC) 40 MG capsule Take 1 capsule (40 mg total) by mouth daily. 90 capsule 2   hydrochlorothiazide (HYDRODIURIL) 25 MG tablet Take 25 mg by mouth daily.     Multiple Vitamin (MULTIVITAMIN WITH MINERALS) TABS Take 2 tablets by mouth every evening.      Multiple Vitamins-Minerals (HAIR/SKIN/NAILS) CAPS Take 2 tablets by mouth daily.     nitroGLYCERIN (NITROSTAT) 0.4 MG SL tablet Place 0.4 mg under the tongue every 5 (five) minutes as needed for chest pain.     OLANZapine (ZYPREXA) 10 MG tablet Take 1 tablet (10 mg total) by mouth at bedtime. 30 tablet 2   rOPINIRole (REQUIP) 3 MG tablet TAKE 1 TABLET (3mg ) BY MOUTH 3 TIMES DAILY.  11   traZODone (DESYREL) 100 MG tablet Take 1 tablet (100 mg total) by mouth at bedtime.  90 tablet 2   No current facility-administered medications for this visit.     Musculoskeletal: Strength & Muscle Tone: na Gait & Station: na Patient leans: N/A  Psychiatric Specialty Exam: Review of Systems  Psychiatric/Behavioral:  Positive for confusion. The patient is nervous/anxious.   All other systems reviewed and are negative.   There were no vitals taken for this visit.There is no height or weight on file to calculate BMI.  General Appearance: NA  Eye Contact:  NA  Speech:  Clear and Coherent  Volume:  Normal  Mood:  Euthymic  Affect:  NA  Thought Process:  Goal Directed  Orientation:  Full (Time, Place, and Person)  Thought Content: Rumination   Suicidal Thoughts:  No  Homicidal Thoughts:  No  Memory:  Immediate;   Fair Recent;   Poor Remote;   NA  Judgement:  Fair  Insight:  Shallow  Psychomotor Activity:  Decreased  Concentration:  Concentration: Fair and Attention Span: Fair  Recall:  Poor  Fund of Knowledge: Fair  Language: Good  Akathisia:  No  Handed:  Right  AIMS (if indicated): not done  Assets:  Communication Skills Desire for Improvement Resilience Social Support  ADL's:  Intact  Cognition: Impaired,  Mild  Sleep:  Good   Screenings: PHQ2-9    Flowsheet Row Video Visit from 06/08/2022 in [facility] [place] Video Visit from 02/23/2022 in [facility] [place] Video Visit from 11/26/2021 in [facility] [place] Video Visit from 06/17/2021 in [facility] at [place] Video Visit from 01/22/2021 in [facility] Total Score 1 0 1 1 6   PHQ-9 Total Score -- -- -- -- 13      Flowsheet Row Video Visit from 06/08/2022 in [facility] [place] Video Visit from 02/23/2022 in [facility] at [place] Video Visit from  11/26/2021 in [facility] at [place]  C-SSRS RISK CATEGORY No Risk No Risk No Risk        Assessment and Plan: This patient is a 77 year old female with a history of bipolar disorder.  She is also entering early dementia.  For what ever reason the pharmacy has given her the same high dose of olanzapine at 20 mg so we will try to cut it down to 10 mg since the daughter thinks it is making her memory worse.  She will continue Xanax for total of 2 mg daily for anxiety and Prozac 40 mg daily for depression as well as trazodone 100 mg at bedtime for sleep.  She will return to see me in 3 months  Collaboration of Care: Collaboration of Care: Primary Care Provider AEB notes will be shared with PCP at patient's request  Patient/Guardian was advised Release of Information must be obtained prior to any record release in order to collaborate their care with an outside provider. Patient/Guardian was advised if they have not already done so to contact the registration department to sign all necessary forms in order for [place] to release information regarding their care.   Consent: Patient/Guardian gives verbal consent for treatment and assignment of benefits for services provided during this visit. Patient/Guardian expressed understanding and agreed to proceed.    Victoria Ruder, MD 05/26/2023, 2:18 PM

## 2023-06-08 DIAGNOSIS — N182 Chronic kidney disease, stage 2 (mild): Secondary | ICD-10-CM | POA: Diagnosis not present

## 2023-06-08 DIAGNOSIS — I10 Essential (primary) hypertension: Secondary | ICD-10-CM | POA: Diagnosis not present

## 2023-06-08 DIAGNOSIS — M1710 Unilateral primary osteoarthritis, unspecified knee: Secondary | ICD-10-CM | POA: Diagnosis not present

## 2023-08-22 ENCOUNTER — Telehealth (INDEPENDENT_AMBULATORY_CARE_PROVIDER_SITE_OTHER): Payer: 59 | Admitting: Psychiatry

## 2023-08-22 ENCOUNTER — Encounter (HOSPITAL_COMMUNITY): Payer: Self-pay | Admitting: Psychiatry

## 2023-08-22 DIAGNOSIS — F3130 Bipolar disorder, current episode depressed, mild or moderate severity, unspecified: Secondary | ICD-10-CM

## 2023-08-22 MED ORDER — ALPRAZOLAM 1 MG PO TABS: 1.0000 mg | ORAL_TABLET | Freq: Two times a day (BID) | ORAL | 1 refills | Status: DC

## 2023-08-22 MED ORDER — OLANZAPINE 10 MG PO TABS: 10.0000 mg | ORAL_TABLET | Freq: Every day | ORAL | 2 refills | Status: DC

## 2023-08-22 MED ORDER — FLUOXETINE HCL 40 MG PO CAPS: 40.0000 mg | ORAL_CAPSULE | Freq: Every day | ORAL | 2 refills | Status: DC

## 2023-08-22 MED ORDER — TRAZODONE HCL 100 MG PO TABS: 100.0000 mg | ORAL_TABLET | Freq: Every day | ORAL | 2 refills | Status: DC

## 2023-08-22 NOTE — Progress Notes (Signed)
Virtual Visit via Telephone Note  I connected with Victoria Mccarthy on 08/22/23 at  3:00 PM EST by telephone and verified that I am speaking with the correct person using two identifiers.  Location: Patient: home Provider: office   I discussed the limitations, risks, security and privacy concerns of performing an evaluation and management service by telephone and the availability of in person appointments. I also discussed with the patient that there may be a patient responsible charge related to this service. The patient expressed understanding and agreed to proceed.     I discussed the assessment and treatment plan with the patient. The patient was provided an opportunity to ask questions and all were answered. The patient agreed with the plan and demonstrated an understanding of the instructions.   The patient was advised to call back or seek an in-person evaluation if the symptoms worsen or if the condition fails to improve as anticipated.  I provided 15 minutes of non-face-to-face time during this encounter.   Diannia Ruder, MD  [facility] MD/PA/NP OP Progress Note  08/22/2023 3:13 PM Victoria Mccarthy  MRN:  [id number]  Chief Complaint:  Chief Complaint  Patient presents with   Depression   Anxiety   Follow-up   HPI: This patient is a 77 year old white female lives with her daughter in [place].  She has 2 children.  She is a retired Psychologist, counselling.  The patient and her daughter Victoria Mccarthy return for follow-up after 3 months regarding her bipolar disorder and memory loss.  Right now the patient has a bad cold but she states in general she has been doing better.  I had cut down her olanzapine from 20 to 10 mg because she was too sedated and she seems to be more alert and awake she is still having trouble with short-term memory.  The patient states that her mood is good and she has not been depressed or significantly anxious.  She is sleeping well at night.  She does not have any manic  symptoms like racing thoughts delusions paranoia or sleeplessness.  She denies being depressed or having thoughts of self-harm or suicide Visit Diagnosis:    ICD-10-CM   1. Bipolar I disorder, most recent episode depressed (HCC)  F31.30       Past Psychiatric History: Psychiatric hospitalization in the 1980s, since then outpatient treatment for bipolar disorder  Past Medical History:  Past Medical History:  Diagnosis Date   Anemia    hx   Anxiety    Arthritis    chronic back pain   Bipolar disorder (HCC)    Cancer (HCC) Jan 2013   kidney cancer, left, s/p partial nephrectomy   Depression    Diarrhea    incontinent stools   Diverticulitis    treated at least 5 times in past, hospitalized twice   Heart murmur    History of hiatal hernia    s/p   MVP (mitral valve prolapse)    Neuropathy    Nocturia    Osteoarthritis    Osteoporosis    PONV (postoperative nausea and vomiting)    Psychotic affective disorder (HCC)    "psychotic delusions" "I cut myself"    Restless leg syndrome    Rheumatic fever    Urinary frequency     Past Surgical History:  Procedure Laterality Date   ABDOMINAL HYSTERECTOMY     ANTERIOR CERVICAL DECOMP/DISCECTOMY FUSION N/A 09/28/2017   Procedure: ANTERIOR CERVICAL DECOMPRESSION/DISCECTOMY FUSION CERVICAL FOUR- CERVICAL FIVE, CERVICAL FIVE- CERVICAL SIX,  CERVICAL SIX- CERVICAL SEVEN;  Surgeon: Tia Alert, MD;  Location: [facility] OR;  Service: Neurosurgery;  Laterality: N/A;  ANTERIOR CERVICAL DECOMPRESSION/DISCECTOMY FUSION CERVICAL FOUR- CERVICAL FIVE, CERVICAL FIVE- CERVICAL SIX, CERVICAL SIX- CERVICAL SEVEN   BACK SURGERY     BACK SURGERY     2 plates, 8 screws in back   carpell tunnell     rt wrist  x2   CATARACT EXTRACTION W/PHACO Left 06/14/2016   Procedure: CATARACT EXTRACTION PHACO AND INTRAOCULAR LENS PLACEMENT; CDE:  3.92;  Surgeon: Susa Simmonds, MD;  Location: [facility];  Service: Ophthalmology;  Laterality: Left;   CHOLECYSTECTOMY      COLONOSCOPY  5/02   pancolonic deverticula, internal hemorrhoids   COLONOSCOPY  1/11   single external hemorrhoidal tag and anal papilla otherwise normal rectum, pancolonic diverticula, s/p sigmoid biopsy and stool sampling all unremarkable   ESOPHAGOGASTRODUODENOSCOPY  04/06/2010   intact Nissen fundoplication S/P dilation, somewhat baggy atonic appearing esophagus, 58-F dilation   ESOPHAGOGASTRODUODENOSCOPY  1/11   nomral esophagus s/p 54-F Maloney dilation, normal/intact Nissen fundoplication, diffuse patchy erythema and erosions likely NSAID/ASA effect with benign biopsy   EYE SURGERY     right eye cataract   kidney surgery for cancer     MAXIMUM ACCESS (MAS)POSTERIOR LUMBAR INTERBODY FUSION (PLIF) 1 LEVEL N/A 07/25/2014   Procedure: FOR MAXIMUM ACCESS (MAS) POSTERIOR LUMBAR INTERBODY FUSION (PLIF) Lumbar two/three, Removal of hardware Lumbar three/four;  Surgeon: Tia Alert, MD;  Location: [facility];  Service: Neurosurgery;  Laterality: N/A;   MAXIMUM ACCESS (MAS)POSTERIOR LUMBAR INTERBODY FUSION (PLIF) 2 LEVEL N/A 09/11/2015   Procedure: LUMBAR FOUR-FIVE LUMBAR FIVE SACRAL ONE  MAXIMUM ACCESS SURGERY, POSTERIOR LUMBAR INTERBODY FUSION , Removal of LUMBAR TWO TO LUMBAR FOUR  HARDWARE;  Surgeon: Tia Alert, MD;  Location: [facility];  Service: Neurosurgery;  Laterality: N/A;   NISSEN FUNDOPLICATION     REVERSE SHOULDER ARTHROPLASTY Right 05/28/2016   Procedure: RIGHT REVERSE SHOULDER ARTHROPLASTY;  Surgeon: Beverely Low, MD;  Location: [facility] OR;  Service: Orthopedics;  Laterality: Right;   ROBOT ASSISTED LAPAROSCOPIC NEPHRECTOMY  08/23/2011   Procedure: ROBOTIC ASSISTED LAPAROSCOPIC NEPHRECTOMY;  Surgeon: Crecencio Mc, MD;  Location: [facility] OR[facility];  Service: Urology;  Laterality: Left;  Left Robotic Assisted  Laparoscopic Partial Nephrectomy    ROTATOR CUFF REPAIR     right side X 2   TONSILLECTOMY     TOTAL SHOULDER REVISION Right 08/19/2017   Procedure: RIGHT SHOULDER POLYETHYLENE EXCHANGE;   Surgeon: Beverely Low, MD;  Location: [facility] OR;  Service: Orthopedics;  Laterality: Right;    Family Psychiatric History: See below  Family History:  Family History  Problem Relation Age of Onset   Bipolar disorder Daughter    Colon cancer Neg Hx     Social History:  Social History   Socioeconomic History   Marital status: Divorced    Spouse name: Not on file   Number of children: Not on file   Years of education: Not on file   Highest education level: Not on file  Occupational History   Not on file  Tobacco Use   Smoking status: Never   Smokeless tobacco: Never  Substance and Sexual Activity   Alcohol use: No   Drug use: No   Sexual activity: Not Currently  Other Topics Concern   Not on file  Social History Narrative   Not on file   Social Determinants of Health   Financial Resource Strain: Not on file  Food  Insecurity: No Food Insecurity (09/02/2022)   Received from [facility], [facility]   Hunger Vital Sign    Worried About Running Out of Food in the Last Year: Never true    Ran Out of Food in the Last Year: Never true  Transportation Needs: No Transportation Needs (09/02/2022)   Received from [facility], [facility]   [facility] - Transportation    Lack of Transportation (Medical): No    Lack of Transportation (Non-Medical): No  Physical Activity: Not on file  Stress: Not on file  Social Connections: Not on file    Allergies:  Allergies  Allergen Reactions   Shellfish Allergy Anaphylaxis   Neurontin [Gabapentin] Other (See Comments)    Unable to talk when takes medication   Chlorhexidine Gluconate Rash and Other (See Comments)    burning   Codeine Itching   Haldol [Haloperidol Lactate] Other (See Comments)    Muscle spasms   Hydromorphone Hcl Nausea And Vomiting   Latex Rash    adhesives   Propoxyphene N-Acetaminophen Nausea And Vomiting   Vicodin [Hydrocodone-Acetaminophen] Itching    Metabolic Disorder Labs: No results found  for: "HGBA1C", "MPG" No results found for: "PROLACTIN" No results found for: "CHOL", "TRIG", "HDL", "CHOLHDL", "VLDL", "LDLCALC" Lab Results  Component Value Date   TSH 2.25 01/28/2016    Therapeutic Level Labs: No results found for: "LITHIUM" No results found for: "VALPROATE" No results found for: "CBMZ"  Current Medications: Current Outpatient Medications  Medication Sig Dispense Refill   ALPRAZolam (XANAX) 1 MG tablet Take 1 tablet (1 mg total) by mouth 2 (two) times daily. 60 tablet 1   Ascorbic Acid (VITA-C PO) Take by mouth daily.     atorvastatin (LIPITOR) 20 MG tablet Take 20 mg by mouth daily.     Cholecalciferol (VITAMIN D3 PO) Take by mouth daily.     cholestyramine (QUESTRAN) 4 GM/DOSE powder TAKE 1 SCOOPFUL OF 4 GRAMS AND MIX WITH LIQUID OR APPLESAUCE AS DIRECTED ON PACKAGING AND TAKE BY MOUTH DAILY - DO NOT TAKE WITHIN 2 HRS OF OTHER MEDS 378 g 1   FLUoxetine (PROZAC) 40 MG capsule Take 1 capsule (40 mg total) by mouth daily. 90 capsule 2   hydrochlorothiazide (HYDRODIURIL) 25 MG tablet Take 25 mg by mouth daily.     Multiple Vitamin (MULTIVITAMIN WITH MINERALS) TABS Take 2 tablets by mouth every evening.      Multiple Vitamins-Minerals (HAIR/SKIN/NAILS) CAPS Take 2 tablets by mouth daily.     nitroGLYCERIN (NITROSTAT) 0.4 MG SL tablet Place 0.4 mg under the tongue every 5 (five) minutes as needed for chest pain.     OLANZapine (ZYPREXA) 10 MG tablet Take 1 tablet (10 mg total) by mouth at bedtime. 30 tablet 2   rOPINIRole (REQUIP) 3 MG tablet TAKE 1 TABLET (3mg ) BY MOUTH 3 TIMES DAILY.  11   traZODone (DESYREL) 100 MG tablet Take 1 tablet (100 mg total) by mouth at bedtime. 90 tablet 2   No current facility-administered medications for this visit.     Musculoskeletal: Strength & Muscle Tone: na Gait & Station: na Patient leans: N/A  Psychiatric Specialty Exam: Review of Systems  HENT:  Positive for congestion and rhinorrhea.   Respiratory:  Positive for cough.    All other systems reviewed and are negative.   There were no vitals taken for this visit.There is no height or weight on file to calculate BMI.  General Appearance: NA  Eye Contact:  NA  Speech:  Clear and Coherent  Volume:  Normal  Mood:  Euthymic  Affect:  NA  Thought Process:  Goal Directed  Orientation:  Full (Time, Place, and Person)  Thought Content: WDL   Suicidal Thoughts:  No  Homicidal Thoughts:  No  Memory:  Immediate;   Fair Recent;   Poor Remote;   NA  Judgement:  Impaired  Insight:  Shallow  Psychomotor Activity:  Decreased  Concentration:  Concentration: Fair and Attention Span: Fair  Recall:  [employer] of Knowledge: Fair  Language: Good  Akathisia:  No  Handed:  Right  AIMS (if indicated): not done  Assets:  Communication Skills Desire for Improvement Resilience Social Support  ADL's:  Intact  Cognition: Impaired,  Mild  Sleep:  Good   Screenings: PHQ2-9    Flowsheet Row Video Visit from 06/08/2022 in [facility] [place] Video Visit from 02/23/2022 in [facility] at [place] Video Visit from 11/26/2021 in [facility] at [place] Video Visit from 06/17/2021 in [facility] at [place] Video Visit from 01/22/2021 in [facility] at [facility] Total Score 1 0 1 1 6   PHQ-9 Total Score -- -- -- -- 13      Flowsheet Row Video Visit from 06/08/2022 in [facility] [place] Video Visit from 02/23/2022 in [facility] at [place] Video Visit from 11/26/2021 in [facility] at [place]  C-SSRS RISK CATEGORY No Risk No Risk No Risk        Assessment and Plan: This patient is a 77 year old female with a history of bipolar disorder and early dementia.  She seems to be less sedated on the olanzapine 10 mg at bedtime  without any impairment of her mental status so this will be continued for bipolar disorder.  She will continue Xanax 1 mg daily for anxiety Prozac 40 mg daily for depression and trazodone 100 mg at bedtime for sleep.  She will return to see me in 3 months  Collaboration of Care: Collaboration of Care: Primary Care Provider AEB notes will be shared with PCP at patient's request  Patient/Guardian was advised Release of Information must be obtained prior to any record release in order to collaborate their care with an outside provider. Patient/Guardian was advised if they have not already done so to contact the registration department to sign all necessary forms in order for [place] to release information regarding their care.   Consent: Patient/Guardian gives verbal consent for treatment and assignment of benefits for services provided during this visit. Patient/Guardian expressed understanding and agreed to proceed.    Diannia Ruder, MD 08/22/2023, 3:13 PM

## 2023-08-27 ENCOUNTER — Inpatient Hospital Stay (HOSPITAL_COMMUNITY)
Admission: EM | Admit: 2023-08-27 | Discharge: 2023-08-31 | DRG: 872 | Disposition: A | Discharge status: Home / Self Care | Payer: 59 | Attending: Family Medicine | Admitting: Family Medicine

## 2023-08-27 ENCOUNTER — Emergency Department (HOSPITAL_COMMUNITY): Payer: 59

## 2023-08-27 ENCOUNTER — Encounter (HOSPITAL_COMMUNITY): Payer: Self-pay | Admitting: *Deleted

## 2023-08-27 ENCOUNTER — Other Ambulatory Visit: Payer: Self-pay

## 2023-08-27 DIAGNOSIS — E876 Hypokalemia: Principal | ICD-10-CM

## 2023-08-27 DIAGNOSIS — E871 Hypo-osmolality and hyponatremia: Secondary | ICD-10-CM | POA: Diagnosis present

## 2023-08-27 DIAGNOSIS — Z91013 Allergy to seafood: Secondary | ICD-10-CM

## 2023-08-27 DIAGNOSIS — Z885 Allergy status to narcotic agent status: Secondary | ICD-10-CM

## 2023-08-27 DIAGNOSIS — Z9842 Cataract extraction status, left eye: Secondary | ICD-10-CM

## 2023-08-27 DIAGNOSIS — E860 Dehydration: Secondary | ICD-10-CM | POA: Diagnosis present

## 2023-08-27 DIAGNOSIS — Z961 Presence of intraocular lens: Secondary | ICD-10-CM | POA: Diagnosis present

## 2023-08-27 DIAGNOSIS — I341 Nonrheumatic mitral (valve) prolapse: Secondary | ICD-10-CM | POA: Diagnosis present

## 2023-08-27 DIAGNOSIS — J069 Acute upper respiratory infection, unspecified: Secondary | ICD-10-CM | POA: Diagnosis present

## 2023-08-27 DIAGNOSIS — A419 Sepsis, unspecified organism: Secondary | ICD-10-CM | POA: Diagnosis not present

## 2023-08-27 DIAGNOSIS — E785 Hyperlipidemia, unspecified: Secondary | ICD-10-CM | POA: Diagnosis present

## 2023-08-27 DIAGNOSIS — Z905 Acquired absence of kidney: Secondary | ICD-10-CM

## 2023-08-27 DIAGNOSIS — Z96611 Presence of right artificial shoulder joint: Secondary | ICD-10-CM | POA: Diagnosis present

## 2023-08-27 DIAGNOSIS — M810 Age-related osteoporosis without current pathological fracture: Secondary | ICD-10-CM | POA: Diagnosis present

## 2023-08-27 DIAGNOSIS — N390 Urinary tract infection, site not specified: Secondary | ICD-10-CM | POA: Diagnosis present

## 2023-08-27 DIAGNOSIS — Z1152 Encounter for screening for COVID-19: Secondary | ICD-10-CM | POA: Diagnosis not present

## 2023-08-27 DIAGNOSIS — G8929 Other chronic pain: Secondary | ICD-10-CM | POA: Diagnosis present

## 2023-08-27 DIAGNOSIS — B974 Respiratory syncytial virus as the cause of diseases classified elsewhere: Secondary | ICD-10-CM | POA: Diagnosis present

## 2023-08-27 DIAGNOSIS — I10 Essential (primary) hypertension: Secondary | ICD-10-CM | POA: Insufficient documentation

## 2023-08-27 DIAGNOSIS — Z85528 Personal history of other malignant neoplasm of kidney: Secondary | ICD-10-CM

## 2023-08-27 DIAGNOSIS — J121 Respiratory syncytial virus pneumonia: Secondary | ICD-10-CM | POA: Insufficient documentation

## 2023-08-27 DIAGNOSIS — G2581 Restless legs syndrome: Secondary | ICD-10-CM | POA: Diagnosis present

## 2023-08-27 DIAGNOSIS — Z90710 Acquired absence of both cervix and uterus: Secondary | ICD-10-CM

## 2023-08-27 DIAGNOSIS — M549 Dorsalgia, unspecified: Secondary | ICD-10-CM | POA: Diagnosis present

## 2023-08-27 DIAGNOSIS — R051 Acute cough: Secondary | ICD-10-CM

## 2023-08-27 DIAGNOSIS — F319 Bipolar disorder, unspecified: Secondary | ICD-10-CM | POA: Diagnosis present

## 2023-08-27 DIAGNOSIS — B338 Other specified viral diseases: Secondary | ICD-10-CM

## 2023-08-27 DIAGNOSIS — Z79899 Other long term (current) drug therapy: Secondary | ICD-10-CM

## 2023-08-27 DIAGNOSIS — N3090 Cystitis, unspecified without hematuria: Secondary | ICD-10-CM

## 2023-08-27 DIAGNOSIS — Z888 Allergy status to other drugs, medicaments and biological substances status: Secondary | ICD-10-CM

## 2023-08-27 DIAGNOSIS — Z981 Arthrodesis status: Secondary | ICD-10-CM

## 2023-08-27 DIAGNOSIS — Z91040 Latex allergy status: Secondary | ICD-10-CM

## 2023-08-27 LAB — URINALYSIS, W/ REFLEX TO CULTURE (INFECTION SUSPECTED)
Bilirubin Urine: NEGATIVE
Glucose, UA: NEGATIVE mg/dL
Ketones, ur: 5 mg/dL — AB
Nitrite: POSITIVE — AB
Protein, ur: NEGATIVE mg/dL
Specific Gravity, Urine: 1.009 (ref 1.005–1.030)
pH: 6 (ref 5.0–8.0)

## 2023-08-27 LAB — CBC WITH DIFFERENTIAL/PLATELET
Abs Immature Granulocytes: 0.18 10*3/uL — ABNORMAL HIGH (ref 0.00–0.07)
Basophils Absolute: 0 10*3/uL (ref 0.0–0.1)
Basophils Relative: 0 %
Eosinophils Absolute: 0 10*3/uL (ref 0.0–0.5)
Eosinophils Relative: 0 %
HCT: 33.3 % — ABNORMAL LOW (ref 36.0–46.0)
Hemoglobin: 11.8 g/dL — ABNORMAL LOW (ref 12.0–15.0)
Immature Granulocytes: 1 %
Lymphocytes Relative: 11 %
Lymphs Abs: 1.5 10*3/uL (ref 0.7–4.0)
MCH: 32.2 pg (ref 26.0–34.0)
MCHC: 35.4 g/dL (ref 30.0–36.0)
MCV: 91 fL (ref 80.0–100.0)
Monocytes Absolute: 1.1 10*3/uL — ABNORMAL HIGH (ref 0.1–1.0)
Monocytes Relative: 8 %
Neutro Abs: 11.7 10*3/uL — ABNORMAL HIGH (ref 1.7–7.7)
Neutrophils Relative %: 80 %
Platelets: 314 10*3/uL (ref 150–400)
RBC: 3.66 MIL/uL — ABNORMAL LOW (ref 3.87–5.11)
RDW: 13.1 % (ref 11.5–15.5)
WBC: 14.6 10*3/uL — ABNORMAL HIGH (ref 4.0–10.5)
nRBC: 0 % (ref 0.0–0.2)

## 2023-08-27 LAB — RESP PANEL BY RT-PCR (RSV, FLU A&B, COVID)  RVPGX2
Influenza A by PCR: NEGATIVE
Influenza B by PCR: NEGATIVE
Resp Syncytial Virus by PCR: POSITIVE — AB
SARS Coronavirus 2 by RT PCR: NEGATIVE

## 2023-08-27 LAB — COMPREHENSIVE METABOLIC PANEL
ALT: 21 U/L (ref 0–44)
AST: 23 U/L (ref 15–41)
Albumin: 3.4 g/dL — ABNORMAL LOW (ref 3.5–5.0)
Alkaline Phosphatase: 84 U/L (ref 38–126)
Anion gap: 13 (ref 5–15)
BUN: 13 mg/dL (ref 8–23)
CO2: 29 mmol/L (ref 22–32)
Calcium: 9.4 mg/dL (ref 8.9–10.3)
Chloride: 83 mmol/L — ABNORMAL LOW (ref 98–111)
Creatinine, Ser: 0.85 mg/dL (ref 0.44–1.00)
GFR, Estimated: >60 mL/min (ref >60)
Glucose, Bld: 207 mg/dL — ABNORMAL HIGH (ref 70–99)
Potassium: 2.4 mmol/L — CL (ref 3.5–5.1)
Sodium: 125 mmol/L — ABNORMAL LOW (ref 135–145)
Total Bilirubin: 0.6 mg/dL (ref <1.2)
Total Protein: 7.5 g/dL (ref 6.5–8.1)

## 2023-08-27 LAB — LACTIC ACID, PLASMA
Lactic Acid, Venous: 1.3 mmol/L (ref 0.5–1.9)
Lactic Acid, Venous: 1.1 mmol/L (ref 0.5–1.9)

## 2023-08-27 LAB — PROTIME-INR
INR: 1 (ref 0.8–1.2)
Prothrombin Time: 13.2 s (ref 11.4–15.2)

## 2023-08-27 LAB — APTT: aPTT: 23 s — ABNORMAL LOW (ref 24–36)

## 2023-08-27 LAB — MAGNESIUM: Magnesium: 1.3 mg/dL — ABNORMAL LOW (ref 1.7–2.4)

## 2023-08-27 MED ORDER — OLANZAPINE 5 MG PO TABS
10.0000 mg | ORAL_TABLET | Freq: Every day | ORAL | Status: DC
  Administered 2023-08-28 – 2023-08-30 (×3): 10 mg via ORAL
  Filled 2023-08-27 (×3): qty 2

## 2023-08-27 MED ORDER — POTASSIUM CHLORIDE CRYS ER 20 MEQ PO TBCR
40.0000 meq | EXTENDED_RELEASE_TABLET | Freq: Once | ORAL | Status: AC
  Administered 2023-08-27: 40 meq via ORAL
  Filled 2023-08-27: qty 2

## 2023-08-27 MED ORDER — POTASSIUM CHLORIDE CRYS ER 20 MEQ PO TBCR
40.0000 meq | EXTENDED_RELEASE_TABLET | Freq: Once | ORAL | Status: AC
  Administered 2023-08-28: 40 meq via ORAL
  Filled 2023-08-27: qty 2

## 2023-08-27 MED ORDER — NITROGLYCERIN 0.4 MG SL SUBL: 0.4000 mg | SUBLINGUAL_TABLET | SUBLINGUAL | Status: DC | PRN

## 2023-08-27 MED ORDER — ACETAMINOPHEN 650 MG RE SUPP: 650.0000 mg | Freq: Four times a day (QID) | RECTAL | Status: DC | PRN

## 2023-08-27 MED ORDER — ALBUTEROL SULFATE HFA 108 (90 BASE) MCG/ACT IN AERS: 2.0000 | INHALATION_SPRAY | RESPIRATORY_TRACT | Status: DC | PRN

## 2023-08-27 MED ORDER — ACETAMINOPHEN 500 MG PO TABS
1000.0000 mg | ORAL_TABLET | Freq: Once | ORAL | Status: AC
  Administered 2023-08-27: 1000 mg via ORAL
  Filled 2023-08-27: qty 2

## 2023-08-27 MED ORDER — ALBUTEROL SULFATE (2.5 MG/3ML) 0.083% IN NEBU: 2.5000 mg | INHALATION_SOLUTION | RESPIRATORY_TRACT | Status: DC | PRN

## 2023-08-27 MED ORDER — HYDRALAZINE HCL 20 MG/ML IJ SOLN
5.0000 mg | INTRAMUSCULAR | Status: DC | PRN
  Administered 2023-08-29: 5 mg via INTRAVENOUS
  Filled 2023-08-27: qty 1

## 2023-08-27 MED ORDER — FLUOXETINE HCL 20 MG PO CAPS
40.0000 mg | ORAL_CAPSULE | Freq: Every day | ORAL | Status: DC
  Administered 2023-08-28 – 2023-08-31 (×4): 40 mg via ORAL
  Filled 2023-08-27 (×2): qty 2
  Filled 2023-08-27: qty 4
  Filled 2023-08-27: qty 2

## 2023-08-27 MED ORDER — POTASSIUM CHLORIDE 10 MEQ/100ML IV SOLN
10.0000 meq | INTRAVENOUS | Status: AC
  Administered 2023-08-27 – 2023-08-28 (×4): 10 meq via INTRAVENOUS
  Filled 2023-08-27 (×4): qty 100

## 2023-08-27 MED ORDER — ATORVASTATIN CALCIUM 10 MG PO TABS
20.0000 mg | ORAL_TABLET | Freq: Every day | ORAL | Status: DC
  Administered 2023-08-28 – 2023-08-31 (×4): 20 mg via ORAL
  Filled 2023-08-27 (×4): qty 2

## 2023-08-27 MED ORDER — FLUOXETINE HCL 40 MG PO CAPS: 40.0000 mg | ORAL_CAPSULE | Freq: Every day | ORAL | Status: DC

## 2023-08-27 MED ORDER — OLANZAPINE 5 MG PO TABS: 10.0000 mg | ORAL_TABLET | Freq: Every day | ORAL | Status: DC

## 2023-08-27 MED ORDER — HEPARIN SODIUM (PORCINE) 5000 UNIT/ML IJ SOLN
5000.0000 [IU] | Freq: Three times a day (TID) | INTRAMUSCULAR | Status: DC
  Administered 2023-08-28 – 2023-08-31 (×10): 5000 [IU] via SUBCUTANEOUS
  Filled 2023-08-27 (×10): qty 1

## 2023-08-27 MED ORDER — ALPRAZOLAM 0.5 MG PO TABS: 1.0000 mg | ORAL_TABLET | Freq: Two times a day (BID) | ORAL | Status: DC

## 2023-08-27 MED ORDER — LACTATED RINGERS IV BOLUS: 30.0000 mL/kg | Freq: Once | INTRAVENOUS | Status: DC

## 2023-08-27 MED ORDER — SODIUM CHLORIDE 0.9 % IV BOLUS
30.0000 mL/kg | Freq: Once | INTRAVENOUS | Status: AC
Start: 2023-08-27 — End: 2023-08-27
  Administered 2023-08-27: 1710 mL via INTRAVENOUS

## 2023-08-27 MED ORDER — ALPRAZOLAM 1 MG PO TABS
1.0000 mg | ORAL_TABLET | Freq: Two times a day (BID) | ORAL | Status: DC | PRN
  Administered 2023-08-30: 1 mg via ORAL
  Filled 2023-08-27: qty 1

## 2023-08-27 MED ORDER — SODIUM CHLORIDE 0.9 % IV SOLN
1.0000 g | Freq: Once | INTRAVENOUS | Status: AC
  Administered 2023-08-27: 1 g via INTRAVENOUS
  Filled 2023-08-27: qty 10

## 2023-08-27 MED ORDER — ACETAMINOPHEN 325 MG PO TABS
650.0000 mg | ORAL_TABLET | Freq: Four times a day (QID) | ORAL | Status: DC | PRN
  Administered 2023-08-28 – 2023-08-30 (×8): 650 mg via ORAL
  Filled 2023-08-27 (×9): qty 2

## 2023-08-27 MED ORDER — POTASSIUM CHLORIDE 10 MEQ/100ML IV SOLN
10.0000 meq | INTRAVENOUS | Status: AC
  Administered 2023-08-27: 10 meq via INTRAVENOUS
  Filled 2023-08-27: qty 100

## 2023-08-27 MED ORDER — SODIUM CHLORIDE 0.9 % IV SOLN
1.0000 g | INTRAVENOUS | Status: DC
  Administered 2023-08-28 – 2023-08-30 (×3): 1 g via INTRAVENOUS
  Filled 2023-08-27 (×3): qty 10

## 2023-08-27 MED ORDER — AMLODIPINE BESYLATE 5 MG PO TABS
5.0000 mg | ORAL_TABLET | Freq: Every day | ORAL | Status: DC
Start: 2023-08-27 — End: 2023-08-30
  Administered 2023-08-28 – 2023-08-29 (×3): 5 mg via ORAL
  Filled 2023-08-27 (×4): qty 1

## 2023-08-27 MED ORDER — MAGNESIUM SULFATE 4 GM/100ML IV SOLN
4.0000 g | Freq: Once | INTRAVENOUS | Status: AC
  Administered 2023-08-27: 4 g via INTRAVENOUS
  Filled 2023-08-27: qty 100

## 2023-08-27 NOTE — ED Provider Notes (Signed)
[place] EMERGENCY DEPARTMENT AT [facility] Provider Note   CSN: [id number] Arrival date & time: 08/27/23  1747     History  Chief Complaint  Patient presents with   Cough   Shortness of Breath    Victoria Mccarthy is a 77 y.o. female.  With a history of rheumatic fever, neuropathy, anxiety, depression, arthritis, anemia presenting to the ED for evaluation of cough and shortness of breath.  Symptoms have been present for approximately 9 days.  She has had upper respiratory infections such as congestion, rhinorrhea, sore throat, cough.  Cough is productive of clear sputum.  Shortness of breath is present with minimal exertion.  She is concerned for pneumonia.  She reports some subjective fevers but has not measured any of these.  She denies any nausea or vomiting.  No chest pain, abdominal pain, dysuria, frequency, urgency, hematuria.  She reports compliance with her home medications.  Daughter at bedside reports that she has been acting more lethargic and confused over the past 2 days.  She would not get out of bed yesterday or today to even eat or drink.   Cough Associated symptoms: shortness of breath   Shortness of Breath Associated symptoms: cough        Home Medications Prior to Admission medications   Medication Sig Start Date End Date Taking? Authorizing Provider  ALPRAZolam Prudy Feeler) 1 MG tablet Take 1 tablet (1 mg total) by mouth 2 (two) times daily. 08/22/23   Myrlene Broker, MD  Ascorbic Acid (VITA-C PO) Take by mouth daily.    [provider]  atorvastatin (LIPITOR) 20 MG tablet Take 20 mg by mouth daily. 07/19/20   [provider]  Cholecalciferol (VITAMIN D3 PO) Take by mouth daily.    [provider]  cholestyramine (QUESTRAN) 4 GM/DOSE powder TAKE 1 SCOOPFUL OF 4 GRAMS AND MIX WITH LIQUID OR APPLESAUCE AS DIRECTED ON PACKAGING AND TAKE BY MOUTH DAILY - DO NOT TAKE WITHIN 2 HRS OF OTHER MEDS 09/28/21   Gelene Mink, NP  FLUoxetine  (PROZAC) 40 MG capsule Take 1 capsule (40 mg total) by mouth daily. 08/22/23 08/21/24  Myrlene Broker, MD  hydrochlorothiazide (HYDRODIURIL) 25 MG tablet Take 25 mg by mouth daily. 11/13/21   [provider]  Multiple Vitamin (MULTIVITAMIN WITH MINERALS) TABS Take 2 tablets by mouth every evening.     [provider]  Multiple Vitamins-Minerals (HAIR/SKIN/NAILS) CAPS Take 2 tablets by mouth daily.    [provider]  nitroGLYCERIN (NITROSTAT) 0.4 MG SL tablet Place 0.4 mg under the tongue every 5 (five) minutes as needed for chest pain.    [provider]  OLANZapine (ZYPREXA) 10 MG tablet Take 1 tablet (10 mg total) by mouth at bedtime. 08/22/23 08/21/24  Myrlene Broker, MD  rOPINIRole (REQUIP) 3 MG tablet TAKE 1 TABLET (3mg ) BY MOUTH 3 TIMES DAILY. 07/21/15   [provider]  traZODone (DESYREL) 100 MG tablet Take 1 tablet (100 mg total) by mouth at bedtime. 08/22/23   Myrlene Broker, MD      Allergies    Shellfish allergy, Neurontin [gabapentin], Chlorhexidine gluconate, Codeine, Haldol [haloperidol lactate], Hydromorphone hcl, Latex, Propoxyphene n-acetaminophen, and Vicodin [hydrocodone-acetaminophen]    Review of Systems   Review of Systems  Respiratory:  Positive for cough and shortness of breath.   All other systems reviewed and are negative.   Physical Exam Updated Vital Signs BP (!) 169/77   Pulse 100   Temp Marland Kitchen)  100.8 F (38.2 C) (Oral)   Resp 18   Ht 5\' 5"  (1.651 m)   Wt 95.3 kg   SpO2 94%   BMI 34.95 kg/m  Physical Exam Vitals and nursing note reviewed.  Constitutional:      General: She is not in acute distress.    Appearance: She is well-developed.     Comments: Resting comfortably in bed  HENT:     Head: Normocephalic and atraumatic.  Eyes:     Conjunctiva/sclera: Conjunctivae normal.  Cardiovascular:     Rate and Rhythm: Normal rate and regular rhythm.     Heart sounds: No murmur heard. Pulmonary:     Effort:  Pulmonary effort is normal. No respiratory distress.     Breath sounds: Examination of the right-lower field reveals wheezing and rales. Examination of the left-lower field reveals wheezing. Wheezing and rales present. No decreased breath sounds or rhonchi.  Abdominal:     Palpations: Abdomen is soft.     Tenderness: There is no abdominal tenderness.  Musculoskeletal:        General: No swelling.     Cervical back: Neck supple.     Right lower leg: No edema.     Left lower leg: No edema.  Skin:    General: Skin is warm and dry.     Capillary Refill: Capillary refill takes less than 2 seconds.  Neurological:     General: No focal deficit present.     Mental Status: She is alert and oriented to person, place, and time.  Psychiatric:        Mood and Affect: Mood normal.     ED Results / Procedures / Treatments   Labs (all labs ordered are listed, but only abnormal results are displayed) Labs Reviewed  RESP PANEL BY RT-PCR (RSV, FLU A&B, COVID)  RVPGX2 - Abnormal; Notable for the following components:      Result Value   Resp Syncytial Virus by PCR POSITIVE (*)    All other components within normal limits  COMPREHENSIVE METABOLIC PANEL - Abnormal; Notable for the following components:   Sodium 125 (*)    Potassium 2.4 (*)    Chloride 83 (*)    Glucose, Bld 207 (*)    Albumin 3.4 (*)    All other components within normal limits  CBC WITH DIFFERENTIAL/PLATELET - Abnormal; Notable for the following components:   WBC 14.6 (*)    RBC 3.66 (*)    Hemoglobin 11.8 (*)    HCT 33.3 (*)    Neutro Abs 11.7 (*)    Monocytes Absolute 1.1 (*)    Abs Immature Granulocytes 0.18 (*)    All other components within normal limits  APTT - Abnormal; Notable for the following components:   aPTT 23 (*)    All other components within normal limits  URINALYSIS, W/ REFLEX TO CULTURE (INFECTION SUSPECTED) - Abnormal; Notable for the following components:   APPearance HAZY (*)    Hgb urine dipstick  SMALL (*)    Ketones, ur 5 (*)    Nitrite POSITIVE (*)    Leukocytes,Ua MODERATE (*)    Bacteria, UA MANY (*)    All other components within normal limits  MAGNESIUM - Abnormal; Notable for the following components:   Magnesium 1.3 (*)    All other components within normal limits  CULTURE, BLOOD (ROUTINE X 2)  CULTURE, BLOOD (ROUTINE X 2)  URINE CULTURE  LACTIC ACID, PLASMA  LACTIC ACID, PLASMA  PROTIME-INR  EKG None  Radiology DG Chest 2 View  Result Date: 08/27/2023 CLINICAL DATA:  Cough, short of breath EXAM: CHEST - 2 VIEW COMPARISON:  09/03/2015 FINDINGS: Frontal and lateral views of the chest demonstrate an unremarkable cardiac silhouette. No acute airspace disease, effusion, or pneumothorax. No acute bony abnormalities. IMPRESSION: 1. No acute intrathoracic process. Electronically Signed   By: Sharlet Salina M.D.   On: 08/27/2023 18:44    Procedures .Critical Care  Performed by: Michelle Piper, PA-C Authorized by: Michelle Piper, PA-C   Critical care provider statement:    Critical care time (minutes):  48   Critical care was necessary to treat or prevent imminent or life-threatening deterioration of the following conditions:  Sepsis   Critical care was time spent personally by me on the following activities:  Development of treatment plan with patient or surrogate, discussions with consultants, evaluation of patient's response to treatment, examination of patient, ordering and review of laboratory studies, ordering and review of radiographic studies, ordering and performing treatments and interventions, pulse oximetry, re-evaluation of patient's condition and review of old charts   Care discussed with: admitting provider   Comments:     Urosepsis     Medications Ordered in ED Medications  albuterol (VENTOLIN HFA) 108 (90 Base) MCG/ACT inhaler 2 puff (has no administration in time range)  potassium chloride 10 mEq in 100 mL IVPB (0 mEq Intravenous Stopped  08/27/23 2056)  magnesium sulfate IVPB 4 g 100 mL (has no administration in time range)  potassium chloride SA (KLOR-CON M) CR tablet 40 mEq (has no administration in time range)  potassium chloride 10 mEq in 100 mL IVPB (has no administration in time range)  acetaminophen (TYLENOL) tablet 1,000 mg (1,000 mg Oral Given 08/27/23 1855)  sodium chloride 0.9 % bolus 1,710 mL (0 mLs Intravenous Stopped 08/27/23 2056)  potassium chloride SA (KLOR-CON M) CR tablet 40 mEq (40 mEq Oral Given 08/27/23 2023)  cefTRIAXone (ROCEPHIN) 1 g in sodium chloride 0.9 % 100 mL IVPB (0 g Intravenous Stopped 08/27/23 2056)    ED Course/ Medical Decision Making/ A&P Clinical Course as of 08/27/23 2059  Sat Aug 27, 2023  2051 Spoke with hospitalist who will admit [AS]    Clinical Course User Index [AS] Lula Olszewski Edsel Petrin, PA-C                                 Medical Decision Making Amount and/or Complexity of Data Reviewed Labs: ordered. Radiology: ordered.  Risk OTC drugs. Prescription drug management. Decision regarding hospitalization.  This patient presents to the ED for concern of cough, shortness of breath, confusion, this involves an extensive number of treatment options, and is a complaint that carries with it a high risk of complications and morbidity.  Differential diagnosis for emergent cause of cough includes but is not limited to upper respiratory infection, lower respiratory infection, allergies, asthma, irritants, foreign body, medications such as ACE inhibitors, reflux, asthma, CHF, lung cancer, interstitial lung disease, psychiatric causes, postnasal drip and postinfectious bronchospasm.   My initial workup includes  Additional history obtained from: Nursing notes from this visit. Family daughter at bedside provides a portion of the history  I ordered, reviewed and interpreted labs which include: Sepsis labs leukocytosis of 14.6, stable anemia with hemoglobin 11.8.  Hyponatremia 125,  hypokalemia of 2.4, hypochloremia of 83, hyperglycemia of 207, hypomagnesemia of 1.3, respiratory panel positive for RSV.  Urine with  nitrite positive, moderate leukocytes, 21-50 white blood cells, many bacteria  I ordered imaging studies including chest x-ray I independently visualized and interpreted imaging which showed no acute cardiopulmonary abnormalities I agree with the radiologist interpretation  Cardiac Monitoring:  The patient was maintained on a cardiac monitor.  I personally viewed and interpreted the cardiac monitored which showed an underlying rhythm of: Sinus tachycardia  Consultations Obtained:  I requested consultation with hospitalist Dr. Randol Kern,  and discussed lab and imaging findings as well as pertinent plan - they recommend: Admission  Febrile and tachycardic as well as hypertensive but otherwise hemodynamically stable.  77 year old female presenting for evaluation of cough, URI symptoms, urinary frequency.  Was reportedly mildly altered as reported by her daughter as well.  She has been lethargic over the past couple days and has not been eating and drinking like normal.  Lab workup as stated above consistent with UTI which is likely the cause of her sepsis.  Cough and shortness of breath likely secondary to RSV.  Antibiotics initiated for sepsis due to UTI.  I discussed the plan with the patient and the daughter at bedside.  They are in agreement with the plan.  Stable at the time of admission.  Patient's case discussed with Dr. Estell Harpin who agrees with plan to admit.   Note: Portions of this report may have been transcribed using voice recognition software. Every effort was made to ensure accuracy; however, inadvertent computerized transcription errors may still be present.        Final Clinical Impression(s) / ED Diagnoses Final diagnoses:  Hypokalemia  Cystitis  Acute cough  RSV infection    Rx / DC Orders ED Discharge Orders     None          Mora Bellman 08/27/23 2059    Bethann Berkshire, MD 08/28/23 1156

## 2023-08-27 NOTE — ED Triage Notes (Signed)
Pt with cold symptoms since 11/28, + cough NP.  + SOB with minimal exertion.  Daughter noticed pt with confusion and concerned for PNA.  Pt alert and oriented to place, month and year. Daughter states last time pt was confused , she had UTI.

## 2023-08-27 NOTE — ED Notes (Signed)
Date and time results received: 08/27/23 2139   Test: K+ Critical Value: 2.4  Name of Provider Notified: Estell Harpin, MD

## 2023-08-27 NOTE — H&P (Signed)
TR[facility] H&P   Patient Demographics:    Victoria Mccarthy, is a 77 y.o. female  MRN: [id number]   DOB - 01/16/1946  Admit Date - 08/27/2023  Outpatient Primary MD for the patient is Shawnie Dapper, PA-C  Referring MD/NP/PA: PA Alexander  Patient coming from: Home  Chief Complaint  Patient presents with   Cough   Shortness of Breath      HPI:    Victoria Mccarthy  is a 77 y.o. female, with past medical history of rheumatic fever, neuropathy, anxiety, depression, arthritis, bipolar disorder, hypertension, hyperlipidemia, she presents to ED secondary to complaints of generalized weakness, fatigue, shortness of breath and cough, reports she was seen by her PCP recently, she was started on prednisone taper, but daughter reports patient has been more confused, with very poor appetite and oral intake, cough became productive with clear phlegm, she did report some dyspnea with minimal exertion which prompted the daughter to come to ED, patient initially denies any urinary symptoms, but then she does endorse increased frequency,. -In ED her workup significant for positive RSV, but her chest x-ray with no pneumonia, labs were significant for hyponatremia 125, severe hypokalemia at 2.4, with low magnesium at 1.3, leukocytosis at 14.6, she was febrile 100.8, UA was positive, [facility] hospitalist consulted to admit    Review of systems:    A full 10 point Review of Systems was done, except as stated above, all other Review of Systems were negative.   With Past History of the following :    Past Medical History:  Diagnosis Date   Anemia    hx   Anxiety    Arthritis    chronic back pain   Bipolar disorder (HCC)    Cancer (HCC) Jan 2013   kidney cancer, left, s/p partial nephrectomy   Depression    Diarrhea    incontinent stools   Diverticulitis    treated at least 5 times in past, hospitalized  twice   Heart murmur    History of hiatal hernia    s/p   MVP (mitral valve prolapse)    Neuropathy    Nocturia    Osteoarthritis    Osteoporosis    PONV (postoperative nausea and vomiting)    Psychotic affective disorder (HCC)    "psychotic delusions" "I cut myself"    Restless leg syndrome    Rheumatic fever    Urinary frequency       Past Surgical History:  Procedure Laterality Date   ABDOMINAL HYSTERECTOMY     ANTERIOR CERVICAL DECOMP/DISCECTOMY FUSION N/A 09/28/2017   Procedure: ANTERIOR CERVICAL DECOMPRESSION/DISCECTOMY FUSION CERVICAL FOUR- CERVICAL FIVE, CERVICAL FIVE- CERVICAL SIX, CERVICAL SIX- CERVICAL SEVEN;  Surgeon: Tia Alert, MD;  Location: [facility] OR;  Service: Neurosurgery;  Laterality: N/A;  ANTERIOR CERVICAL DECOMPRESSION/DISCECTOMY FUSION CERVICAL FOUR- CERVICAL FIVE, CERVICAL FIVE- CERVICAL SIX, CERVICAL SIX- CERVICAL SEVEN   BACK  SURGERY     BACK SURGERY     2 plates, 8 screws in back   carpell tunnell     rt wrist  x2   CATARACT EXTRACTION W/PHACO Left 06/14/2016   Procedure: CATARACT EXTRACTION PHACO AND INTRAOCULAR LENS PLACEMENT; CDE:  3.92;  Surgeon: Susa Simmonds, MD;  Location: [facility];  Service: Ophthalmology;  Laterality: Left;   CHOLECYSTECTOMY     COLONOSCOPY  5/02   pancolonic deverticula, internal hemorrhoids   COLONOSCOPY  1/11   single external hemorrhoidal tag and anal papilla otherwise normal rectum, pancolonic diverticula, s/p sigmoid biopsy and stool sampling all unremarkable   ESOPHAGOGASTRODUODENOSCOPY  04/06/2010   intact Nissen fundoplication S/P dilation, somewhat baggy atonic appearing esophagus, 58-F dilation   ESOPHAGOGASTRODUODENOSCOPY  1/11   nomral esophagus s/p 54-F Maloney dilation, normal/intact Nissen fundoplication, diffuse patchy erythema and erosions likely NSAID/ASA effect with benign biopsy   EYE SURGERY     right eye cataract   kidney surgery for cancer     MAXIMUM ACCESS (MAS)POSTERIOR LUMBAR INTERBODY FUSION (PLIF)  1 LEVEL N/A 07/25/2014   Procedure: FOR MAXIMUM ACCESS (MAS) POSTERIOR LUMBAR INTERBODY FUSION (PLIF) Lumbar two/three, Removal of hardware Lumbar three/four;  Surgeon: Tia Alert, MD;  Location: [facility];  Service: Neurosurgery;  Laterality: N/A;   MAXIMUM ACCESS (MAS)POSTERIOR LUMBAR INTERBODY FUSION (PLIF) 2 LEVEL N/A 09/11/2015   Procedure: LUMBAR FOUR-FIVE LUMBAR FIVE SACRAL ONE  MAXIMUM ACCESS SURGERY, POSTERIOR LUMBAR INTERBODY FUSION , Removal of LUMBAR TWO TO LUMBAR FOUR  HARDWARE;  Surgeon: Tia Alert, MD;  Location: [facility];  Service: Neurosurgery;  Laterality: N/A;   NISSEN FUNDOPLICATION     REVERSE SHOULDER ARTHROPLASTY Right 05/28/2016   Procedure: RIGHT REVERSE SHOULDER ARTHROPLASTY;  Surgeon: Beverely Low, MD;  Location: [facility] OR;  Service: Orthopedics;  Laterality: Right;   ROBOT ASSISTED LAPAROSCOPIC NEPHRECTOMY  08/23/2011   Procedure: ROBOTIC ASSISTED LAPAROSCOPIC NEPHRECTOMY;  Surgeon: Crecencio Mc, MD;  Location: [facility];  Service: Urology;  Laterality: Left;  Left Robotic Assisted  Laparoscopic Partial Nephrectomy    ROTATOR CUFF REPAIR     right side X 2   TONSILLECTOMY     TOTAL SHOULDER REVISION Right 08/19/2017   Procedure: RIGHT SHOULDER POLYETHYLENE EXCHANGE;  Surgeon: Beverely Low, MD;  Location: [facility] OR;  Service: Orthopedics;  Laterality: Right;      Social History:     Social History   Tobacco Use   Smoking status: Never   Smokeless tobacco: Never  Substance Use Topics   Alcohol use: No     Family History :     Family History  Problem Relation Age of Onset   Bipolar disorder Daughter    Colon cancer Neg Hx      Home Medications:   Prior to Admission medications   Medication Sig Start Date End Date Taking? Authorizing Provider  ALPRAZolam Prudy Feeler) 1 MG tablet Take 1 tablet (1 mg total) by mouth 2 (two) times daily. 08/22/23   Myrlene Broker, MD  Ascorbic Acid (VITA-C PO) Take by mouth daily.    [provider]  atorvastatin  (LIPITOR) 20 MG tablet Take 20 mg by mouth daily. 07/19/20   [provider]  Cholecalciferol (VITAMIN D3 PO) Take by mouth daily.    [provider]  cholestyramine (QUESTRAN) 4 GM/DOSE powder TAKE 1 SCOOPFUL OF 4 GRAMS AND MIX WITH LIQUID OR APPLESAUCE AS DIRECTED ON PACKAGING AND TAKE BY MOUTH DAILY - DO NOT TAKE WITHIN 2 HRS  OF OTHER MEDS 09/28/21   Gelene Mink, NP  FLUoxetine (PROZAC) 40 MG capsule Take 1 capsule (40 mg total) by mouth daily. 08/22/23 08/21/24  Myrlene Broker, MD  hydrochlorothiazide (HYDRODIURIL) 25 MG tablet Take 25 mg by mouth daily. 11/13/21   [provider]  Multiple Vitamin (MULTIVITAMIN WITH MINERALS) TABS Take 2 tablets by mouth every evening.     [provider]  Multiple Vitamins-Minerals (HAIR/SKIN/NAILS) CAPS Take 2 tablets by mouth daily.    [provider]  nitroGLYCERIN (NITROSTAT) 0.4 MG SL tablet Place 0.4 mg under the tongue every 5 (five) minutes as needed for chest pain.    [provider]  OLANZapine (ZYPREXA) 10 MG tablet Take 1 tablet (10 mg total) by mouth at bedtime. 08/22/23 08/21/24  Myrlene Broker, MD  rOPINIRole (REQUIP) 3 MG tablet TAKE 1 TABLET (3mg ) BY MOUTH 3 TIMES DAILY. 07/21/15   [provider]  traZODone (DESYREL) 100 MG tablet Take 1 tablet (100 mg total) by mouth at bedtime. 08/22/23   Myrlene Broker, MD     Allergies:     Allergies  Allergen Reactions   Shellfish Allergy Anaphylaxis   Neurontin [Gabapentin] Other (See Comments)    Unable to talk when takes medication   Chlorhexidine Gluconate Rash and Other (See Comments)    burning   Codeine Itching   Haldol [Haloperidol Lactate] Other (See Comments)    Muscle spasms   Hydromorphone Hcl Nausea And Vomiting   Latex Rash    adhesives   Propoxyphene N-Acetaminophen Nausea And Vomiting   Vicodin [Hydrocodone-Acetaminophen] Itching     Physical Exam:   Vitals  Blood pressure (!) 175/65, pulse 98, temperature (!)  100.8 F (38.2 C), temperature source Oral, resp. rate 16, height 5\' 5"  (1.651 m), weight 95.3 kg, SpO2 93%.   1. General, frail, deconditioned female, laying in bed, no apparent distress  2. Normal affect and insight, Not Suicidal or Homicidal, Awake Alert, Oriented X 3.  3. No F.N deficits, ALL C.Nerves Intact, Strength 5/5 all 4 extremities, Sensation intact all 4 extremities, Plantars down going.  4. Ears and Eyes appear Normal, Conjunctivae clear, PERRLA.  Dry oral Mucosa.  5. Supple Neck, No JVD, No cervical lymphadenopathy appriciated, No Carotid Bruits.  6. Symmetrical Chest wall movement, Good air movement bilaterally, CTAB.  7. RRR, No Gallops, Rubs or Murmurs, No Parasternal Heave.  8. Positive Bowel Sounds, Abdomen Soft, No tenderness, No organomegaly appriciated,No rebound -guarding or rigidity.  9.  No Cyanosis, Normal Skin Turgor, No Skin Rash or Bruise.  10. Good muscle tone,  joints appear normal , no effusions, Normal ROM.     Data Review:    CBC Recent Labs  Lab 08/27/23 1847  WBC 14.6*  HGB 11.8*  HCT 33.3*  PLT 314  MCV 91.0  MCH 32.2  MCHC 35.4  RDW 13.1  LYMPHSABS 1.5  MONOABS 1.1*  EOSABS 0.0  BASOSABS 0.0   ------------------------------------------------------------------------------------------------------------------  Chemistries  Recent Labs  Lab 08/27/23 1847  NA 125*  K 2.4*  CL 83*  CO2 29  GLUCOSE 207*  BUN 13  CREATININE 0.85  CALCIUM 9.4  MG 1.3*  AST 23  ALT 21  ALKPHOS 84  BILITOT 0.6   ------------------------------------------------------------------------------------------------------------------ estimated creatinine clearance is 65.3 mL/min (by C-G formula based on SCr of 0.85 mg/dL). ------------------------------------------------------------------------------------------------------------------ No results for input(s): "TSH", "T4TOTAL", "T3FREE", "THYROIDAB" in the last 72 hours.  Invalid input(s):  "FREET3"  Coagulation profile Recent Labs  Lab 08/27/23 1847  INR 1.0   ------------------------------------------------------------------------------------------------------------------- No results for input(s): "DDIMER" in the last 72 hours. -------------------------------------------------------------------------------------------------------------------  Cardiac Enzymes No results for input(s): "CKMB", "TROPONINI", "MYOGLOBIN" in the last 168 hours.  Invalid input(s): "CK" ------------------------------------------------------------------------------------------------------------------ No results found for: "BNP"   ---------------------------------------------------------------------------------------------------------------  Urinalysis    Component Value Date/Time   COLORURINE YELLOW 08/27/2023 1936   APPEARANCEUR HAZY (A) 08/27/2023 1936   LABSPEC 1.009 08/27/2023 1936   PHURINE 6.0 08/27/2023 1936   GLUCOSEU NEGATIVE 08/27/2023 1936   HGBUR SMALL (A) 08/27/2023 1936   BILIRUBINUR NEGATIVE 08/27/2023 1936   KETONESUR 5 (A) 08/27/2023 1936   PROTEINUR NEGATIVE 08/27/2023 1936   NITRITE POSITIVE (A) 08/27/2023 1936   LEUKOCYTESUR MODERATE (A) 08/27/2023 1936    ----------------------------------------------------------------------------------------------------------------   Imaging Results:    DG Chest 2 View  Result Date: 08/27/2023 CLINICAL DATA:  Cough, short of breath EXAM: CHEST - 2 VIEW COMPARISON:  09/03/2015 FINDINGS: Frontal and lateral views of the chest demonstrate an unremarkable cardiac silhouette. No acute airspace disease, effusion, or pneumothorax. No acute bony abnormalities. IMPRESSION: 1. No acute intrathoracic process. Electronically Signed   By: Sharlet Salina M.D.   On: 08/27/2023 18:44     EKG:  Vent. rate 106 BPM PR interval 141 ms QRS duration 91 ms QT/QTcB 319/424 ms P-R-T axes 60 15 58 Sinus tachycardia Abnormal R-wave progression,  early transition Borderline repolarization abnormality Baseline wander in lead(s) V2  Assessment & Plan:    Principal Problem:   Sepsis (HCC) Active Problems:   Essential hypertension   Bipolar affective (HCC)   RSV (respiratory syncytial virus pneumonia)  Sepsis secondary to UTI - present on admission, Abrol 100.8,, tachypneic with altered mental status and leukocytosis -due to UTI -Follow on urine cultures and blood cultures -Continue with IV Rocephin  RSV infection -She reports cough, congestion, but no hypoxia, no open pneumonia, continue with supportive care  Hypertension -Hold hydrochlorothiazide and will start on Norvasc and as needed hydralazine till her hypokalemia corrects  Severe hypokalemia -Continue with replacement, hold hydrochlorothiazide, monitor telemetry  Severe hypomagnesemia -Repleted as well, recheck in a.m.  Bipolar disorder -He is on Prozac and Zyprexa, will resume, but will await for tomorrow still her potassium has been replaced  Hyponatremia -Due to volume depletion and dehydration, continue with IV fluids  DVT Prophylaxis Heparin   AM Labs Ordered, also please review Full Orders  Family Communication: Admission, patients condition and plan of care including tests being ordered have been discussed with the patient and daughter at bedside* who indicate understanding and agree with the plan and Code Status.  Code Status full code  Likely DC to home home  Consults called: None  Admission status: Inpatient  Time spent in minutes : 70 minutes   Huey Bienenstock M.D on 08/27/2023 at 9:42 PM   [facility] - Office  [phone]

## 2023-08-27 NOTE — ED Notes (Signed)
ED TO INPATIENT HANDOFF REPORT  ED Nurse Name and Phone #: Meridee Score, RN [phone]  S Name/Age/Gender Victoria Mccarthy 77 y.o. female Room/Bed: APA14/APA14  Code Status   Code Status: Prior  Home/SNF/Other Home Patient oriented to: self, place, time, and situation Is this baseline? Yes   Triage Complete: Triage complete  Chief Complaint Sepsis [facility]) [A41.9]  Triage Note Pt with cold symptoms since 11/28, + cough NP.  + SOB with minimal exertion.  Daughter noticed pt with confusion and concerned for PNA.  Pt alert and oriented to place, month and year. Daughter states last time pt was confused , she had UTI.    Allergies Allergies  Allergen Reactions   Shellfish Allergy Anaphylaxis   Neurontin [Gabapentin] Other (See Comments)    Unable to talk when takes medication   Chlorhexidine Gluconate Rash and Other (See Comments)    burning   Codeine Itching   Haldol [Haloperidol Lactate] Other (See Comments)    Muscle spasms   Hydromorphone Hcl Nausea And Vomiting   Latex Rash    adhesives   Propoxyphene N-Acetaminophen Nausea And Vomiting   Vicodin [Hydrocodone-Acetaminophen] Itching    Level of Care/Admitting Diagnosis ED Disposition     ED Disposition  Admit   Condition  --   Comment  Hospital Area: [facility] [[id number]]  Level of Care: Telemetry [5]  Covid Evaluation: Asymptomatic - no recent exposure (last 10 days) testing not required  Diagnosis: Sepsis [facility]) [[id number]]  Admitting Physician: Chiquita Loth  Attending Physician: Randol Kern, DAWOOD S [[id number]]  Certification:: I certify this patient will need inpatient services for at least 2 midnights  Expected Medical Readiness: 08/30/2023          B Medical/Surgery History Past Medical History:  Diagnosis Date   Anemia    hx   Anxiety    Arthritis    chronic back pain   Bipolar disorder (HCC)    Cancer (HCC) Jan 2013   kidney cancer, left, s/p partial nephrectomy    Depression    Diarrhea    incontinent stools   Diverticulitis    treated at least 5 times in past, hospitalized twice   Heart murmur    History of hiatal hernia    s/p   MVP (mitral valve prolapse)    Neuropathy    Nocturia    Osteoarthritis    Osteoporosis    PONV (postoperative nausea and vomiting)    Psychotic affective disorder (HCC)    "psychotic delusions" "I cut myself"    Restless leg syndrome    Rheumatic fever    Urinary frequency    Past Surgical History:  Procedure Laterality Date   ABDOMINAL HYSTERECTOMY     ANTERIOR CERVICAL DECOMP/DISCECTOMY FUSION N/A 09/28/2017   Procedure: ANTERIOR CERVICAL DECOMPRESSION/DISCECTOMY FUSION CERVICAL FOUR- CERVICAL FIVE, CERVICAL FIVE- CERVICAL SIX, CERVICAL SIX- CERVICAL SEVEN;  Surgeon: Tia Alert, MD;  Location: [facility] OR;  Service: Neurosurgery;  Laterality: N/A;  ANTERIOR CERVICAL DECOMPRESSION/DISCECTOMY FUSION CERVICAL FOUR- CERVICAL FIVE, CERVICAL FIVE- CERVICAL SIX, CERVICAL SIX- CERVICAL SEVEN   BACK SURGERY     BACK SURGERY     2 plates, 8 screws in back   carpell tunnell     rt wrist  x2   CATARACT EXTRACTION W/PHACO Left 06/14/2016   Procedure: CATARACT EXTRACTION PHACO AND INTRAOCULAR LENS PLACEMENT; CDE:  3.92;  Surgeon: Susa Simmonds, MD;  Location: [facility];  Service: Ophthalmology;  Laterality: Left;   CHOLECYSTECTOMY  COLONOSCOPY  5/02   pancolonic deverticula, internal hemorrhoids   COLONOSCOPY  1/11   single external hemorrhoidal tag and anal papilla otherwise normal rectum, pancolonic diverticula, s/p sigmoid biopsy and stool sampling all unremarkable   ESOPHAGOGASTRODUODENOSCOPY  04/06/2010   intact Nissen fundoplication S/P dilation, somewhat baggy atonic appearing esophagus, 58-F dilation   ESOPHAGOGASTRODUODENOSCOPY  1/11   nomral esophagus s/p 54-F Maloney dilation, normal/intact Nissen fundoplication, diffuse patchy erythema and erosions likely NSAID/ASA effect with benign biopsy   EYE SURGERY      right eye cataract   kidney surgery for cancer     MAXIMUM ACCESS (MAS)POSTERIOR LUMBAR INTERBODY FUSION (PLIF) 1 LEVEL N/A 07/25/2014   Procedure: FOR MAXIMUM ACCESS (MAS) POSTERIOR LUMBAR INTERBODY FUSION (PLIF) Lumbar two/three, Removal of hardware Lumbar three/four;  Surgeon: Tia Alert, MD;  Location: [facility];  Service: Neurosurgery;  Laterality: N/A;   MAXIMUM ACCESS (MAS)POSTERIOR LUMBAR INTERBODY FUSION (PLIF) 2 LEVEL N/A 09/11/2015   Procedure: LUMBAR FOUR-FIVE LUMBAR FIVE SACRAL ONE  MAXIMUM ACCESS SURGERY, POSTERIOR LUMBAR INTERBODY FUSION , Removal of LUMBAR TWO TO LUMBAR FOUR  HARDWARE;  Surgeon: Tia Alert, MD;  Location: [facility];  Service: Neurosurgery;  Laterality: N/A;   NISSEN FUNDOPLICATION     REVERSE SHOULDER ARTHROPLASTY Right 05/28/2016   Procedure: RIGHT REVERSE SHOULDER ARTHROPLASTY;  Surgeon: Beverely Low, MD;  Location: [facility] OR;  Service: Orthopedics;  Laterality: Right;   ROBOT ASSISTED LAPAROSCOPIC NEPHRECTOMY  08/23/2011   Procedure: ROBOTIC ASSISTED LAPAROSCOPIC NEPHRECTOMY;  Surgeon: Crecencio Mc, MD;  Location: [facility];  Service: Urology;  Laterality: Left;  Left Robotic Assisted  Laparoscopic Partial Nephrectomy    ROTATOR CUFF REPAIR     right side X 2   TONSILLECTOMY     TOTAL SHOULDER REVISION Right 08/19/2017   Procedure: RIGHT SHOULDER POLYETHYLENE EXCHANGE;  Surgeon: Beverely Low, MD;  Location: [facility] OR;  Service: Orthopedics;  Laterality: Right;     A IV Location/Drains/Wounds Patient Lines/Drains/Airways Status     Active Line/Drains/Airways     Name Placement date Placement time Site Days   Peripheral IV 08/27/23 20 G 1" Anterior;Left;Proximal Forearm 08/27/23  1846  Forearm  less than 1   Closed System Drain Back Accordion (Hemovac) 09/11/15  --  Back  2907   Incision 08/23/11 Other (Comment) Left 08/23/11  1025  -- 4387   Incision 01/04/13 Back Other (Comment) 01/04/13  1351  -- 3887   Incision (Closed) 07/25/14 Back 07/25/14  1014  --  3320   Incision (Closed) 09/11/15 Back Other (Comment) 09/11/15  1328  -- 2907   Incision (Closed) 05/28/16 Shoulder Right 05/28/16  0842  -- 2647   Incision (Closed) 06/14/16 Eye Left 06/14/16  0758  -- 2630   Incision (Closed) 08/19/17 Shoulder 08/19/17  0842  -- 2199   Incision (Closed) 09/28/17 Neck 09/28/17  1037  -- 2159   Incision - 4 Ports Abdomen 1: Proximal;Upper;Umbilicus 2: Distal;Umbilicus 3: Left;Lateral 4: Left;Lateral;Distal 08/23/11  --  -- 4387   Incision - 1 Port Abdomen 1: Umbilicus;Medial 08/23/11  --  -- 4387            Intake/Output Last 24 hours  Intake/Output Summary (Last 24 hours) at 08/27/2023 2114 Last data filed at 08/27/2023 2056 Gross per 24 hour  Intake 1710 ml  Output --  Net 1710 ml    Labs/Imaging Results for orders placed or performed during the hospital encounter of 08/27/23 (from the past 48 hour(s))  Lactic acid, plasma  Status: None   Collection Time: 08/27/23  6:47 PM  Result Value Ref Range   Lactic Acid, Venous 1.3 0.5 - 1.9 mmol/L    Comment: Performed at [facility], [address] [place], [place] [address]  Comprehensive metabolic panel     Status: Abnormal   Collection Time: 08/27/23  6:47 PM  Result Value Ref Range   Sodium 125 (L) 135 - 145 mmol/L   Potassium 2.4 (LL) 3.5 - 5.1 mmol/L    Comment: CRITICAL RESULT CALLED TO, READ BACK BY AND VERIFIED WITH JEFFERY S. ON 08/27/2023 @19 :39 BY T.HAMER   Chloride 83 (L) 98 - 111 mmol/L   CO2 29 22 - 32 mmol/L   Glucose, Bld 207 (H) 70 - 99 mg/dL    Comment: Glucose reference range applies only to samples taken after fasting for at least 8 hours.   BUN 13 8 - 23 mg/dL   Creatinine, Ser 6.04 0.44 - 1.00 mg/dL   Calcium 9.4 8.9 - 54.0 mg/dL   Total Protein 7.5 6.5 - 8.1 g/dL   Albumin 3.4 (L) 3.5 - 5.0 g/dL   AST 23 15 - 41 U/L   ALT 21 0 - 44 U/L   Alkaline Phosphatase 84 38 - 126 U/L   Total Bilirubin 0.6 <1.2 mg/dL   GFR, Estimated >98 >11 mL/min    Comment:  (NOTE) Calculated using the CKD-EPI Creatinine Equation (2021)    Anion gap 13 5 - 15    Comment: Performed at [facility], [address], [place], [place] [address]  CBC with Differential     Status: Abnormal   Collection Time: 08/27/23  6:47 PM  Result Value Ref Range   WBC 14.6 (H) 4.0 - 10.5 K/uL   RBC 3.66 (L) 3.87 - 5.11 MIL/uL   Hemoglobin 11.8 (L) 12.0 - 15.0 g/dL   HCT 29.5 (L) 62.1 - 30.8 %   MCV 91.0 80.0 - 100.0 fL   MCH 32.2 26.0 - 34.0 pg   MCHC 35.4 30.0 - 36.0 g/dL   RDW 65.7 84.6 - 96.2 %   Platelets 314 150 - 400 K/uL   nRBC 0.0 0.0 - 0.2 %   Neutrophils Relative % 80 %   Neutro Abs 11.7 (H) 1.7 - 7.7 K/uL   Lymphocytes Relative 11 %   Lymphs Abs 1.5 0.7 - 4.0 K/uL   Monocytes Relative 8 %   Monocytes Absolute 1.1 (H) 0.1 - 1.0 K/uL   Eosinophils Relative 0 %   Eosinophils Absolute 0.0 0.0 - 0.5 K/uL   Basophils Relative 0 %   Basophils Absolute 0.0 0.0 - 0.1 K/uL   Immature Granulocytes 1 %   Abs Immature Granulocytes 0.18 (H) 0.00 - 0.07 K/uL    Comment: Performed at [facility], [address] [place], [place] [address]  Protime-INR     Status: None   Collection Time: 08/27/23  6:47 PM  Result Value Ref Range   Prothrombin Time 13.2 11.4 - 15.2 seconds   INR 1.0 0.8 - 1.2    Comment: (NOTE) INR goal varies based on device and disease states. Performed at [facility], [address] [place], [place] [address]   APTT     Status: Abnormal   Collection Time: 08/27/23  6:47 PM  Result Value Ref Range   aPTT 23 (L) 24 - 36 seconds    Comment: Performed at [facility], [address] [place], [place] [address]  Blood Culture (routine x 2)  Status: None (Preliminary result)   Collection Time: 08/27/23  6:47 PM   Specimen: Left Antecubital; Blood  Result Value Ref Range   Specimen Description LEFT ANTECUBITAL BLOOD    Special Requests      BOTTLES DRAWN AEROBIC AND ANAEROBIC Blood Culture adequate volume Performed at [facility], [address], [place], [place] [address]    Culture PENDING    Report Status PENDING   Magnesium     Status: Abnormal   Collection Time: 08/27/23  6:47 PM  Result Value Ref Range   Magnesium 1.3 (L) 1.7 - 2.4 mg/dL    Comment: Performed at [facility], [address], [place], [place] [address]  Blood Culture (routine x 2)     Status: None (Preliminary result)   Collection Time: 08/27/23  6:52 PM   Specimen: Right Antecubital; Blood  Result Value Ref Range   Specimen Description RIGHT ANTECUBITAL BLOOD    Special Requests      BOTTLES DRAWN AEROBIC AND ANAEROBIC Blood Culture adequate volume Performed at [facility], [address] [place], [place] [address]    Culture PENDING    Report Status PENDING   Urinalysis, w/ Reflex to Culture (Infection Suspected) -Urine, Clean Catch     Status: Abnormal   Collection Time: 08/27/23  7:36 PM  Result Value Ref Range   Specimen Source URINE, CLEAN CATCH    Color, Urine YELLOW YELLOW   APPearance HAZY (A) CLEAR   Specific Gravity, Urine 1.009 1.005 - 1.030   pH 6.0 5.0 - 8.0   Glucose, UA NEGATIVE NEGATIVE mg/dL   Hgb urine dipstick SMALL (A) NEGATIVE   Bilirubin Urine NEGATIVE NEGATIVE   Ketones, ur 5 (A) NEGATIVE mg/dL   Protein, ur NEGATIVE NEGATIVE mg/dL   Nitrite POSITIVE (A) NEGATIVE   Leukocytes,Ua MODERATE (A) NEGATIVE   RBC / HPF 0-5 0 - 5 RBC/hpf   WBC, UA 21-50 0 - 5 WBC/hpf    Comment:        Reflex urine culture not performed if WBC <=10, OR if Squamous epithelial cells >5. If Squamous epithelial cells >5 suggest recollection.    Bacteria, UA MANY (A) NONE SEEN   Squamous Epithelial / HPF 0-5 0 - 5 /HPF    Comment: Performed at [facility], [address] [place], [place] [address]  Resp panel by RT-PCR (RSV, Flu A&B, Covid) Anterior Nasal Swab     Status: Abnormal   Collection Time: 08/27/23  8:03 PM   Specimen: Anterior Nasal Swab  Result Value Ref Range   SARS Coronavirus 2 by RT PCR NEGATIVE NEGATIVE    Comment:  (NOTE) SARS-CoV-2 target nucleic acids are NOT DETECTED.  The SARS-CoV-2 RNA is generally detectable in upper respiratory specimens during the acute phase of infection. The lowest concentration of SARS-CoV-2 viral copies this assay can detect is 138 copies/mL. A negative result does not preclude SARS-Cov-2 infection and should not be used as the sole basis for treatment or other patient management decisions. A negative result may occur with  improper specimen collection/handling, submission of specimen other than nasopharyngeal swab, presence of viral mutation(s) within the areas targeted by this assay, and inadequate number of viral copies(<138 copies/mL). A negative result must be combined with clinical observations, patient history, and epidemiological information. The expected result is Negative.  Fact Sheet for Patients:  BloggerCourse.com  Fact Sheet for Healthcare Providers:  SeriousBroker.it  This test is no t yet approved or cleared by the [place] and  has been authorized for detection and/or diagnosis of SARS-CoV-2 by FDA under an Emergency Use Authorization (EUA). This EUA will remain  in effect (meaning this test can be used) for the duration of the COVID-19 declaration under Section 564(b)(1) of the Act, 21 U.S.C.section 360bbb-3(b)(1), unless the authorization is terminated  or revoked sooner.       Influenza A by PCR NEGATIVE NEGATIVE   Influenza B by PCR NEGATIVE NEGATIVE    Comment: (NOTE) The Xpert Xpress SARS-CoV-2/FLU/RSV plus assay is intended as an aid in the diagnosis of influenza from Nasopharyngeal swab specimens and should not be used as a sole basis for treatment. Nasal washings and aspirates are unacceptable for Xpert Xpress SARS-CoV-2/FLU/RSV testing.  Fact Sheet for Patients: BloggerCourse.com  Fact Sheet for Healthcare  Providers: SeriousBroker.it  This test is not yet approved or cleared by the [place] FDA and has been authorized for detection and/or diagnosis of SARS-CoV-2 by FDA under an Emergency Use Authorization (EUA). This EUA will remain in effect (meaning this test can be used) for the duration of the COVID-19 declaration under Section 564(b)(1) of the Act, 21 U.S.C. section 360bbb-3(b)(1), unless the authorization is terminated or revoked.     Resp Syncytial Virus by PCR POSITIVE (A) NEGATIVE    Comment: (NOTE) Fact Sheet for Patients: BloggerCourse.com  Fact Sheet for Healthcare Providers: SeriousBroker.it  This test is not yet approved or cleared by the [place] FDA and has been authorized for detection and/or diagnosis of SARS-CoV-2 by FDA under an Emergency Use Authorization (EUA). This EUA will remain in effect (meaning this test can be used) for the duration of the COVID-19 declaration under Section 564(b)(1) of the Act, 21 U.S.C. section 360bbb-3(b)(1), unless the authorization is terminated or revoked.  Performed at [facility], [address] [place], [place] [address]   Lactic acid, plasma     Status: None   Collection Time: 08/27/23  8:32 PM  Result Value Ref Range   Lactic Acid, Venous 1.1 0.5 - 1.9 mmol/L    Comment: Performed at [facility], [address] [place], [place] [address]   DG Chest 2 View  Result Date: 08/27/2023 CLINICAL DATA:  Cough, short of breath EXAM: CHEST - 2 VIEW COMPARISON:  09/03/2015 FINDINGS: Frontal and lateral views of the chest demonstrate an unremarkable cardiac silhouette. No acute airspace disease, effusion, or pneumothorax. No acute bony abnormalities. IMPRESSION: 1. No acute intrathoracic process. Electronically Signed   By: Sharlet Salina M.D.   On: 08/27/2023 18:44    Pending Labs Unresulted Labs (From admission, onward)     Start     Ordered    08/27/23 1936  Urine Culture  Once,   R        08/27/23 1936            Vitals/Pain Today's Vitals   08/27/23 1803 08/27/23 2000 08/27/23 2029 08/27/23 2030  BP:  (!) 146/65  (!) 169/77  Pulse: (!) 105 100  100  Resp:  20  18  Temp:  (!) 100.8 F (38.2 C)    TempSrc:  Oral    SpO2: 92% 93%  94%  Weight:      Height:      PainSc:   0-No pain     Isolation Precautions No active isolations  Medications Medications  albuterol (VENTOLIN HFA) 108 (90 Base) MCG/ACT inhaler 2 puff (has no administration in time range)  potassium chloride 10 mEq in 100 mL IVPB (10 mEq Intravenous New  Bag/Given 08/27/23 2113)  magnesium sulfate IVPB 4 g 100 mL (4 g Intravenous New Bag/Given 08/27/23 2103)  potassium chloride SA (KLOR-CON M) CR tablet 40 mEq (has no administration in time range)  potassium chloride 10 mEq in 100 mL IVPB (has no administration in time range)  acetaminophen (TYLENOL) tablet 1,000 mg (1,000 mg Oral Given 08/27/23 1855)  sodium chloride 0.9 % bolus 1,710 mL (0 mLs Intravenous Stopped 08/27/23 2056)  potassium chloride SA (KLOR-CON M) CR tablet 40 mEq (40 mEq Oral Given 08/27/23 2023)  cefTRIAXone (ROCEPHIN) 1 g in sodium chloride 0.9 % 100 mL IVPB (0 g Intravenous Stopped 08/27/23 2056)    Mobility walks with device- daughter says pt will not use walker like supposed to      Focused Assessments    R Recommendations: See Admitting Provider Note  Report given to:   Additional Notes:

## 2023-08-28 DIAGNOSIS — A419 Sepsis, unspecified organism: Secondary | ICD-10-CM

## 2023-08-28 LAB — CBC
HCT: 31 % — ABNORMAL LOW (ref 36.0–46.0)
Hemoglobin: 10.5 g/dL — ABNORMAL LOW (ref 12.0–15.0)
MCH: 31.9 pg (ref 26.0–34.0)
MCHC: 33.9 g/dL (ref 30.0–36.0)
MCV: 94.2 fL (ref 80.0–100.0)
Platelets: 284 10*3/uL (ref 150–400)
RBC: 3.29 MIL/uL — ABNORMAL LOW (ref 3.87–5.11)
RDW: 13.5 % (ref 11.5–15.5)
WBC: 10.4 10*3/uL (ref 4.0–10.5)
nRBC: 0 % (ref 0.0–0.2)

## 2023-08-28 LAB — BASIC METABOLIC PANEL
Anion gap: 9 (ref 5–15)
BUN: 8 mg/dL (ref 8–23)
CO2: 27 mmol/L (ref 22–32)
Calcium: 8.4 mg/dL — ABNORMAL LOW (ref 8.9–10.3)
Chloride: 95 mmol/L — ABNORMAL LOW (ref 98–111)
Creatinine, Ser: 0.69 mg/dL (ref 0.44–1.00)
GFR, Estimated: >60 mL/min (ref >60)
Glucose, Bld: 163 mg/dL — ABNORMAL HIGH (ref 70–99)
Potassium: 3.9 mmol/L (ref 3.5–5.1)
Sodium: 131 mmol/L — ABNORMAL LOW (ref 135–145)

## 2023-08-28 LAB — MAGNESIUM: Magnesium: 2.6 mg/dL — ABNORMAL HIGH (ref 1.7–2.4)

## 2023-08-28 MED ORDER — GUAIFENESIN-DM 100-10 MG/5ML PO SYRP
10.0000 mL | ORAL_SOLUTION | Freq: Three times a day (TID) | ORAL | Status: DC
  Administered 2023-08-28 – 2023-08-31 (×9): 10 mL via ORAL
  Filled 2023-08-28 (×9): qty 10

## 2023-08-28 MED ORDER — METHYLPREDNISOLONE 4 MG PO TABS
16.0000 mg | ORAL_TABLET | Freq: Every day | ORAL | Status: DC
  Administered 2023-08-28: 16 mg via ORAL
  Filled 2023-08-28: qty 4
  Filled 2023-08-28 (×2): qty 1
  Filled 2023-08-28: qty 4

## 2023-08-28 NOTE — Hospital Course (Signed)
Victoria Mccarthy  is a 77 y.o. female, with past medical history of rheumatic fever, neuropathy, anxiety, depression, arthritis, bipolar disorder, hypertension, hyperlipidemia, she presents to ED secondary to complaints of generalized weakness, fatigue, shortness of breath and cough, reports she was seen by her PCP recently, she was started on prednisone taper, but daughter reports patient has been more confused, with very poor appetite and oral intake, cough became productive with clear phlegm, she did report some dyspnea with minimal exertion which prompted the daughter to come to ED, patient initially denies any urinary symptoms, but then she does endorse increased frequency,. -In ED her workup significant for positive RSV, but her chest x-ray with no pneumonia, labs were significant for hyponatremia 125, severe hypokalemia at 2.4, with low magnesium at 1.3, leukocytosis at 14.6, she was febrile 100.8, UA was positive, [facility] hospitalist consulted to admit.     Assessment & Plan:     Principal Problem:   Sepsis (HCC) Active Problems:   Essential hypertension   Bipolar affective (HCC)   RSV (respiratory syncytial virus pneumonia)   Sepsis secondary to UTI -Resolved sepsis physiology, hemodynamically stable Denies any dysuria, but complaining of cough and congestion   - POA: Temp.  100.8,, tachypneic with altered mental status and leukocytosis -Likely UTI/upper respiratory infection -Follow on urine cultures and blood cultures -Continue with IV Rocephin   RSV infection -with acute respiratory distress -Continue complain of cough congestion, no hypoxia, shortness of albumin exertion satting 97% on room air  - but no hypoxia, no open pneumonia, continue with supportive care -Adding methylprednisone, DuoNeb bronchodilators    Hypertension -In the setting of hypokalemia -Hold hydrochlorothiazide  -Continue Norvasc -As needed IV hydralazine   Severe hypokalemia Serum potassium level  2.4, 3.9, 4.1 -Continue with replacement, hold hydrochlorothiazide, monitor telemetry  -Monitor, improved   Severe hypomagnesemia -Repleted   Bipolar disorder -On  Prozac and Zyprexa,    Hyponatremia -POA: 125>>> 131 -Improving -Due to volume depletion and dehydration, continue with IV fluids

## 2023-08-28 NOTE — Plan of Care (Signed)
  Problem: Education: Goal: Knowledge of General Education information will improve Description Including pain rating scale, medication(s)/side effects and non-pharmacologic comfort measures Outcome: Progressing   Problem: Health Behavior/Discharge Planning: Goal: Ability to manage health-related needs will improve Outcome: Progressing   

## 2023-08-28 NOTE — Progress Notes (Signed)
PROGRESS NOTE    Patient: Victoria Mccarthy                            PCP: Shawnie Dapper, PA-C                    DOB: 1946/02/18            DOA: 08/27/2023 ZOX:[id number]             DOS: 08/28/2023, 10:59 AM   LOS: 1 day   Date of Service: The patient was seen and examined on 08/28/2023  Subjective:   The patient was seen and examined this morning. Hemodynamically stable. No issues overnight .  Brief Narrative:   Victoria Mccarthy  is a 77 y.o. female, with past medical history of rheumatic fever, neuropathy, anxiety, depression, arthritis, bipolar disorder, hypertension, hyperlipidemia, she presents to ED secondary to complaints of generalized weakness, fatigue, shortness of breath and cough, reports she was seen by her PCP recently, she was started on prednisone taper, but daughter reports patient has been more confused, with very poor appetite and oral intake, cough became productive with clear phlegm, she did report some dyspnea with minimal exertion which prompted the daughter to come to ED, patient initially denies any urinary symptoms, but then she does endorse increased frequency,. -In ED her workup significant for positive RSV, but her chest x-ray with no pneumonia, labs were significant for hyponatremia 125, severe hypokalemia at 2.4, with low magnesium at 1.3, leukocytosis at 14.6, she was febrile 100.8, UA was positive, [facility] consulted to admit.     Assessment & Plan:     Principal Problem:   Sepsis (HCC) Active Problems:   Essential hypertension   Bipolar affective (HCC)   RSV (respiratory syncytial virus pneumonia)   Sepsis secondary to UTI -Resolved sepsis physiology Hemodynamically stable - POA: Temp.  100.8,, tachypneic with altered mental status and leukocytosis -Likely UTI -Follow on urine cultures and blood cultures -Continue with IV Rocephin   RSV infection -She reports cough, congestion, but no hypoxia, no open pneumonia, continue with  supportive care -Adding methylprednisone -Nebs   Hypertension -In the setting of hypokalemia -Hold hydrochlorothiazide  -Continue Norvasc -As needed IV hydralazine   Severe hypokalemia -Continue with replacement, hold hydrochlorothiazide, monitor telemetry  -Monitor, improved   Severe hypomagnesemia -Repleted   Bipolar disorder -On  Prozac and Zyprexa,    Hyponatremia -POA: 125>>> 131 -Improving -Due to volume depletion and dehydration, continue with IV fluids    ------------------------------------------------------------------------------------------------------------------------------------------ Nutritional status:  The patient's BMI is: Body mass index is 35.19 kg/m. I agree with the assessment and plan as outlined  -------------------------------------------------------------------------------------------------------------------------------------------- Cultures; Blood Cultures x 2 >> NGT  Sputum Culture >> NGT    ------------------------------------------------------------------------------------------------------------------------------------------------  DVT prophylaxis:  heparin injection 5,000 Units Start: 08/28/23 0600   Code Status:   Code Status: Full Code  Family Communication: No family member present at bedside-  -Advance care planning has been discussed.   Admission status:   Status is: Inpatient Remains inpatient appropriate because: Eating IV fluid IV antibiotics   Disposition: From  - home             Planning for discharge in 1-2 days: to   Procedures:   No admission procedures for hospital encounter.   Antimicrobials:  Anti-infectives (From admission, onward)    Start     Dose/Rate Route Frequency Ordered Stop   08/28/23 1400  cefTRIAXone (  ROCEPHIN) 1 g in sodium chloride 0.9 % 100 mL IVPB        1 g 200 mL/hr over 30 Minutes Intravenous Every 24 hours 08/27/23 2140     08/27/23 2000  cefTRIAXone (ROCEPHIN) 1 g in sodium  chloride 0.9 % 100 mL IVPB        1 g 200 mL/hr over 30 Minutes Intravenous  Once 08/27/23 1958 08/27/23 2056        Medication:   amLODipine  5 mg Oral Daily   atorvastatin  20 mg Oral Daily   FLUoxetine  40 mg Oral Daily   guaiFENesin-dextromethorphan  10 mL Oral Q8H   heparin  5,000 Units Subcutaneous Q8H   methylPREDNISolone  16 mg Oral Daily   OLANZapine  10 mg Oral QHS    acetaminophen **OR** acetaminophen, albuterol, ALPRAZolam, hydrALAZINE, nitroGLYCERIN   Objective:   Vitals:   08/27/23 2150 08/27/23 2300 08/28/23 0055 08/28/23 0433  BP: (!) 143/70  (!) 168/66 (!) 151/70  Pulse: 93  88 83  Resp: 14     Temp: (!) 100.5 F (38.1 C)  99.5 F (37.5 C) 99.1 F (37.3 C)  TempSrc: Oral  Oral Oral  SpO2: 95%  94% 96%  Weight: 92.6 kg 93 kg    Height:  5\' 4"  (1.626 m)      Intake/Output Summary (Last 24 hours) at 08/28/2023 1059 Last data filed at 08/28/2023 0700 Gross per 24 hour  Intake 2539.23 ml  Output 1100 ml  Net 1439.23 ml   Filed Weights   08/27/23 1759 08/27/23 2150 08/27/23 2300  Weight: 95.3 kg 92.6 kg 93 kg     Physical examination:        General:  AAO x 3,  cooperative, no distress;   HEENT:  Normocephalic, PERRL, otherwise with in Normal limits   Neuro:  CNII-XII intact. , normal motor and sensation, reflexes intact   Lungs:   Clear to auscultation BL, Respirations unlabored,  No wheezes / crackles  Cardio:    S1/S2, RRR, No murmure, No Rubs or Gallops   Abdomen:  Soft, non-tender, bowel sounds active all four quadrants, no guarding or peritoneal signs.  Muscular  skeletal:  Limited exam -global generalized weaknesses - in bed, able to move all 4 extremities,   2+ pulses,  symmetric, No pitting edema  Skin:  Dry, warm to touch, negative for any Rashes,  Wounds: Please see nursing documentation           ------------------------------------------------------------------------------------------------------------------------------------------    LABs:     Latest Ref Rng & Units 08/28/2023    4:17 AM 08/27/2023    6:47 PM 09/27/2017    1:26 PM  CBC  WBC 4.0 - 10.5 K/uL 10.4  14.6  5.3   Hemoglobin 12.0 - 15.0 g/dL 33.2  95.1  88.4   Hematocrit 36.0 - 46.0 % 31.0  33.3  38.3   Platelets 150 - 400 K/uL 284  314  201       Latest Ref Rng & Units 08/28/2023    4:17 AM 08/27/2023    6:47 PM 08/17/2017    1:35 PM  CMP  Glucose 70 - 99 mg/dL 166  063  92   BUN 8 - 23 mg/dL 8  13  9    Creatinine 0.44 - 1.00 mg/dL 0.16  0.10  9.32   Sodium 135 - 145 mmol/L 131  125  140   Potassium 3.5 - 5.1 mmol/L 3.9  2.4  4.0  Chloride 98 - 111 mmol/L 95  83  107   CO2 22 - 32 mmol/L 27  29  27    Calcium 8.9 - 10.3 mg/dL 8.4  9.4  9.4   Total Protein 6.5 - 8.1 g/dL  7.5    Total Bilirubin <1.2 mg/dL  0.6    Alkaline Phos 38 - 126 U/L  84    AST 15 - 41 U/L  23    ALT 0 - 44 U/L  21         Micro Results Recent Results (from the past 240 hour(s))  Blood Culture (routine x 2)     Status: None (Preliminary result)   Collection Time: 08/27/23  6:47 PM   Specimen: Left Antecubital; Blood  Result Value Ref Range Status   Specimen Description LEFT ANTECUBITAL BLOOD  Final   Special Requests   Final    BOTTLES DRAWN AEROBIC AND ANAEROBIC Blood Culture adequate volume   Culture   Final    NO GROWTH < 12 HOURS Performed at [facility], [address], [place], [place] [address]    Report Status PENDING  Incomplete  Blood Culture (routine x 2)     Status: None (Preliminary result)   Collection Time: 08/27/23  6:52 PM   Specimen: Right Antecubital; Blood  Result Value Ref Range Status   Specimen Description RIGHT ANTECUBITAL BLOOD  Final   Special Requests   Final    BOTTLES DRAWN AEROBIC AND ANAEROBIC Blood Culture adequate volume   Culture   Final    NO GROWTH < 12 HOURS Performed at [facility], [address], [place], [place] [address]    Report Status PENDING  Incomplete  Resp panel by RT-PCR (RSV, Flu A&B, Covid) Anterior Nasal Swab     Status: Abnormal   Collection Time: 08/27/23  8:03 PM   Specimen: Anterior Nasal Swab  Result Value Ref Range Status   SARS Coronavirus 2 by RT PCR NEGATIVE NEGATIVE Final    Comment: (NOTE) SARS-CoV-2 target nucleic acids are NOT DETECTED.  The SARS-CoV-2 RNA is generally detectable in upper respiratory specimens during the acute phase of infection. The lowest concentration of SARS-CoV-2 viral copies this assay can detect is 138 copies/mL. A negative result does not preclude SARS-Cov-2 infection and should not be used as the sole basis for treatment or other patient management decisions. A negative result may occur with  improper specimen collection/handling, submission of specimen other than nasopharyngeal swab, presence of viral mutation(s) within the areas targeted by this assay, and inadequate number of viral copies(<138 copies/mL). A negative result must be combined with clinical observations, patient history, and epidemiological information. The expected result is Negative.  Fact Sheet for Patients:  BloggerCourse.com  Fact Sheet for Healthcare Providers:  SeriousBroker.it  This test is no t yet approved or cleared by the [place] FDA and  has been authorized for detection and/or diagnosis of SARS-CoV-2 by FDA under an Emergency Use Authorization (EUA). This EUA will remain  in effect (meaning this test can be used) for the duration of the COVID-19 declaration under Section 564(b)(1) of the Act, 21 U.S.C.section 360bbb-3(b)(1), unless the authorization is terminated  or revoked sooner.       Influenza A by PCR NEGATIVE NEGATIVE Final   Influenza B by PCR NEGATIVE NEGATIVE Final    Comment: (NOTE) The Xpert Xpress SARS-CoV-2/FLU/RSV plus assay is intended as an  aid in the diagnosis of influenza from Nasopharyngeal swab specimens and should not  be used as a sole basis for treatment. Nasal washings and aspirates are unacceptable for Xpert Xpress SARS-CoV-2/FLU/RSV testing.  Fact Sheet for Patients: BloggerCourse.com  Fact Sheet for Healthcare Providers: SeriousBroker.it  This test is not yet approved or cleared by the [place] FDA and has been authorized for detection and/or diagnosis of SARS-CoV-2 by FDA under an Emergency Use Authorization (EUA). This EUA will remain in effect (meaning this test can be used) for the duration of the COVID-19 declaration under Section 564(b)(1) of the Act, 21 U.S.C. section 360bbb-3(b)(1), unless the authorization is terminated or revoked.     Resp Syncytial Virus by PCR POSITIVE (A) NEGATIVE Final    Comment: (NOTE) Fact Sheet for Patients: BloggerCourse.com  Fact Sheet for Healthcare Providers: SeriousBroker.it  This test is not yet approved or cleared by the [place] FDA and has been authorized for detection and/or diagnosis of SARS-CoV-2 by FDA under an Emergency Use Authorization (EUA). This EUA will remain in effect (meaning this test can be used) for the duration of the COVID-19 declaration under Section 564(b)(1) of the Act, 21 U.S.C. section 360bbb-3(b)(1), unless the authorization is terminated or revoked.  Performed at [facility], [address], [place], [place] [address]     Radiology Reports DG Chest 2 View  Result Date: 08/27/2023 CLINICAL DATA:  Cough, short of breath EXAM: CHEST - 2 VIEW COMPARISON:  09/03/2015 FINDINGS: Frontal and lateral views of the chest demonstrate an unremarkable cardiac silhouette. No acute airspace disease, effusion, or pneumothorax. No acute bony abnormalities. IMPRESSION: 1. No acute intrathoracic process. Electronically Signed   By: Sharlet Salina M.D.   On: 08/27/2023 18:44    SIGNED: Kendell Bane, MD, FHM. FAAFP. [facility] - [facility]ist Time spent - 55 min.  In seeing, evaluating and examining the patient. Reviewing medical records, labs, drawn plan of care. [facility],  Pager (please use [email or web address] to page/ text) Please use Epic Secure Chat for non-urgent communication (7AM-7PM)  If 7PM-7AM, please contact night-coverage [email or web address], 08/28/2023, 10:59 AM

## 2023-08-28 NOTE — Progress Notes (Signed)
   08/28/23 1712  TOC Brief Assessment  Insurance and Status Reviewed  Patient has primary care physician Yes  Home environment has been reviewed From Home  Prior level of function: Independent  Prior/Current Home Services No current home services  Social Determinants of Health Reivew SDOH reviewed no interventions necessary  Readmission risk has been reviewed Yes  Transition of care needs no transition of care needs at this time     Transition of Care Department [facility]) has reviewed patient and no TOC needs have been identified at this time. We will continue to monitor patient advancement through interdisciplinary progression rounds. If new patient transition needs arise, please place a TOC consult.

## 2023-08-28 NOTE — Plan of Care (Signed)

## 2023-08-28 NOTE — Progress Notes (Signed)
Lab called states Blood culture anerobic bottle positive for gram positive cocci. Notified Dr. Flossie Dibble

## 2023-08-29 DIAGNOSIS — A419 Sepsis, unspecified organism: Secondary | ICD-10-CM | POA: Diagnosis not present

## 2023-08-29 LAB — BLOOD CULTURE ID PANEL (REFLEXED) - BCID2

## 2023-08-29 LAB — CBC
HCT: 32.9 % — ABNORMAL LOW (ref 36.0–46.0)
Hemoglobin: 10.7 g/dL — ABNORMAL LOW (ref 12.0–15.0)
MCH: 30.9 pg (ref 26.0–34.0)
MCHC: 32.5 g/dL (ref 30.0–36.0)
MCV: 95.1 fL (ref 80.0–100.0)
Platelets: 307 10*3/uL (ref 150–400)
RBC: 3.46 MIL/uL — ABNORMAL LOW (ref 3.87–5.11)
RDW: 13.2 % (ref 11.5–15.5)
WBC: 8.5 10*3/uL (ref 4.0–10.5)
nRBC: 0 % (ref 0.0–0.2)

## 2023-08-29 LAB — BASIC METABOLIC PANEL
Anion gap: 7 (ref 5–15)
BUN: 11 mg/dL (ref 8–23)
CO2: 26 mmol/L (ref 22–32)
Calcium: 8.9 mg/dL (ref 8.9–10.3)
Chloride: 97 mmol/L — ABNORMAL LOW (ref 98–111)
Creatinine, Ser: 0.72 mg/dL (ref 0.44–1.00)
GFR, Estimated: >60 mL/min (ref >60)
Glucose, Bld: 163 mg/dL — ABNORMAL HIGH (ref 70–99)
Potassium: 4.1 mmol/L (ref 3.5–5.1)
Sodium: 130 mmol/L — ABNORMAL LOW (ref 135–145)

## 2023-08-29 MED ORDER — FAMOTIDINE 20 MG PO TABS
40.0000 mg | ORAL_TABLET | Freq: Every day | ORAL | Status: DC
  Administered 2023-08-29 – 2023-08-31 (×3): 40 mg via ORAL
  Filled 2023-08-29 (×3): qty 2

## 2023-08-29 MED ORDER — TRAMADOL HCL 50 MG PO TABS: 50.0000 mg | ORAL_TABLET | Freq: Four times a day (QID) | ORAL | Status: DC | PRN

## 2023-08-29 MED ORDER — METHYLPREDNISOLONE 4 MG PO TABS
8.0000 mg | ORAL_TABLET | Freq: Every day | ORAL | Status: DC
  Administered 2023-08-29 – 2023-08-30 (×2): 8 mg via ORAL
  Filled 2023-08-29 (×3): qty 2

## 2023-08-29 MED ORDER — IBUPROFEN 600 MG PO TABS
600.0000 mg | ORAL_TABLET | Freq: Once | ORAL | Status: AC
Start: 2023-08-29 — End: 2023-08-29
  Administered 2023-08-29: 600 mg via ORAL
  Filled 2023-08-29: qty 1

## 2023-08-29 MED ORDER — ALBUTEROL SULFATE (2.5 MG/3ML) 0.083% IN NEBU
2.5000 mg | INHALATION_SOLUTION | Freq: Three times a day (TID) | RESPIRATORY_TRACT | Status: DC
  Administered 2023-08-29 – 2023-08-31 (×6): 2.5 mg via RESPIRATORY_TRACT
  Filled 2023-08-29 (×5): qty 3

## 2023-08-29 MED ORDER — ALBUTEROL SULFATE (2.5 MG/3ML) 0.083% IN NEBU
2.5000 mg | INHALATION_SOLUTION | Freq: Three times a day (TID) | RESPIRATORY_TRACT | Status: DC
  Administered 2023-08-29: 2.5 mg via RESPIRATORY_TRACT
  Filled 2023-08-29 (×2): qty 3

## 2023-08-29 NOTE — Progress Notes (Signed)
Lab called and pts blood cultures came back positive for Staph Epi- no resistance. MD made aware.

## 2023-08-29 NOTE — Progress Notes (Addendum)
Mobility Specialist Progress Note:    08/29/23 1330  Oxygen Therapy  SpO2 98 %  O2 Device Room Air  Mobility  Activity Ambulated with assistance in room  Level of Assistance Minimal assist, patient does 75% or more (Hand held assist)  Assistive Device None  Distance Ambulated (ft) 30 ft  Range of Motion/Exercises Active;All extremities  Activity Response Tolerated well  Mobility Referral Yes  Mobility visit 1 Mobility  Mobility Specialist Start Time (ACUTE ONLY) 1320  Mobility Specialist Stop Time (ACUTE ONLY) 1330  Mobility Specialist Time Calculation (min) (ACUTE ONLY) 10 min   Pt received in bed, NT in room. Agreeable to mobility, required MinA with hand held assist to stand and ambulate with no AD. Tolerated well, c/o SOB. SpO2 93% on RA. Left pt sitting EOB, alarm on. All needs met.   [name] Mobility Specialist Please contact via Special educational needs teacher or  Rehab office at [phone]

## 2023-08-29 NOTE — Plan of Care (Signed)

## 2023-08-29 NOTE — Plan of Care (Signed)
°  Problem: Health Behavior/Discharge Planning: °Goal: Ability to manage health-related needs will improve °Outcome: Progressing °  °Problem: Clinical Measurements: °Goal: Ability to maintain clinical measurements within normal limits will improve °Outcome: Progressing °  °Problem: Clinical Measurements: °Goal: Diagnostic test results will improve °Outcome: Progressing °  °Problem: Clinical Measurements: °Goal: Respiratory complications will improve °Outcome: Progressing °  °

## 2023-08-29 NOTE — Progress Notes (Signed)
PROGRESS NOTE    Patient: Victoria Mccarthy                            PCP: Shawnie Dapper, PA-C                    DOB: Dec 09, 1945            DOA: 08/27/2023 [id number]:[id number]             DOS: 08/29/2023, 10:51 AM   LOS: 2 days   Date of Service: The patient was seen and examined on 08/29/2023  Subjective:   The patient was seen and examined this morning, stable sitting up in chair, awake alert oriented no acute distress Complaining of cough, congestion, generalized weakness this  Brief Narrative:   Victoria Mccarthy  is a 77 y.o. female, with past medical history of rheumatic fever, neuropathy, anxiety, depression, arthritis, bipolar disorder, hypertension, hyperlipidemia, she presents to ED secondary to complaints of generalized weakness, fatigue, shortness of breath and cough, reports she was seen by her PCP recently, she was started on prednisone taper, but daughter reports patient has been more confused, with very poor appetite and oral intake, cough became productive with clear phlegm, she did report some dyspnea with minimal exertion which prompted the daughter to come to ED, patient initially denies any urinary symptoms, but then she does endorse increased frequency,. -In ED her workup significant for positive RSV, but her chest x-ray with no pneumonia, labs were significant for hyponatremia 125, severe hypokalemia at 2.4, with low magnesium at 1.3, leukocytosis at 14.6, she was febrile 100.8, UA was positive, [facility] consulted to admit.     Assessment & Plan:     Principal Problem:   Sepsis (HCC) Active Problems:   Essential hypertension   Bipolar affective (HCC)   RSV (respiratory syncytial virus pneumonia)   Sepsis secondary to UTI -Resolved sepsis physiology, hemodynamically stable Denies any dysuria, but complaining of cough and congestion   - POA: Temp.  100.8,, tachypneic with altered mental status and leukocytosis -Likely UTI/upper respiratory  infection -Follow on urine cultures and blood cultures -Continue with IV Rocephin   RSV infection -with acute respiratory distress -Continue complain of cough congestion, no hypoxia, shortness of albumin exertion satting 97% on room air  - but no hypoxia, no open pneumonia, continue with supportive care -Adding methylprednisone, DuoNeb bronchodilators    Hypertension -In the setting of hypokalemia -Hold hydrochlorothiazide  -Continue Norvasc -As needed IV hydralazine   Severe hypokalemia Serum potassium level 2.4, 3.9, 4.1 -Continue with replacement, hold hydrochlorothiazide, monitor telemetry  -Monitor, improved   Severe hypomagnesemia -Repleted   Bipolar disorder -On  Prozac and Zyprexa,    Hyponatremia -POA: 125>>> 131 -Improving -Due to volume depletion and dehydration, continue with IV fluids    ------------------------------------------------------------------------------------------------------------------------------------------ Nutritional status:  The patient's BMI is: Body mass index is 35.19 kg/m. I agree with the assessment and plan as outlined  -------------------------------------------------------------------------------------------------------------------------------------------- Cultures; 08/27/2023 blood Cultures x 2 >> Staph epidermidis 08/29/2023 blood cultures>>>   Sputum Culture >> NGT    ------------------------------------------------------------------------------------------------------------------------------------------------  DVT prophylaxis:  heparin injection 5,000 Units Start: 08/28/23 0600   Code Status:   Code Status: Full Code  Family Communication: No family member present at bedside-  -Advance care planning has been discussed.   Admission status:   Status is: Inpatient Remains inpatient appropriate because: Eating IV fluid IV antibiotics   Disposition: From  - home  Planning for discharge in 1-2 days: to    Procedures:   No admission procedures for hospital encounter.   Antimicrobials:  Anti-infectives (From admission, onward)    Start     Dose/Rate Route Frequency Ordered Stop   08/28/23 1400  cefTRIAXone (ROCEPHIN) 1 g in sodium chloride 0.9 % 100 mL IVPB        1 g 200 mL/hr over 30 Minutes Intravenous Every 24 hours 08/27/23 2140     08/27/23 2000  cefTRIAXone (ROCEPHIN) 1 g in sodium chloride 0.9 % 100 mL IVPB        1 g 200 mL/hr over 30 Minutes Intravenous  Once 08/27/23 1958 08/27/23 2056        Medication:   albuterol  2.5 mg Nebulization Q8H   amLODipine  5 mg Oral Daily   atorvastatin  20 mg Oral Daily   FLUoxetine  40 mg Oral Daily   guaiFENesin-dextromethorphan  10 mL Oral Q8H   heparin  5,000 Units Subcutaneous Q8H   methylPREDNISolone  8 mg Oral Q breakfast   OLANZapine  10 mg Oral QHS    acetaminophen **OR** acetaminophen, ALPRAZolam, hydrALAZINE, nitroGLYCERIN   Objective:   Vitals:   08/28/23 0433 08/28/23 2107 08/29/23 0427 08/29/23 0638  BP: (!) 151/70 (!) 164/79 (!) 177/72 (!) 138/59  Pulse: 83 85 79   Resp:  20    Temp: 99.1 F (37.3 C)  99.3 F (37.4 C)   TempSrc: Oral  Oral   SpO2: 96% 98% 97%   Weight:      Height:        Intake/Output Summary (Last 24 hours) at 08/29/2023 1051 Last data filed at 08/29/2023 0849 Gross per 24 hour  Intake 580 ml  Output --  Net 580 ml   Filed Weights   08/27/23 1759 08/27/23 2150 08/27/23 2300  Weight: 95.3 kg 92.6 kg 93 kg     Physical examination:        General:  AAO x 3,  cooperative, no distress;   HEENT:  Normocephalic, PERRL, otherwise with in Normal limits   Neuro:  CNII-XII intact. , normal motor and sensation, reflexes intact   Lungs:   Clear to auscultation BL, Respirations unlabored,  Diffuse wheezing, rhonchi, negative any crackles  Cardio:    S1/S2, RRR, No murmure, No Rubs or Gallops   Abdomen:  Soft, non-tender, bowel sounds active all four quadrants, no guarding or  peritoneal signs.  Muscular  skeletal:  Limited exam -global generalized weaknesses - in bed, able to move all 4 extremities,   2+ pulses,  symmetric, No pitting edema  Skin:  Dry, warm to touch, negative for any Rashes,  Wounds: Please see nursing documentation            ------------------------------------------------------------------------------------------------------------------------------------------    LABs:     Latest Ref Rng & Units 08/29/2023    4:41 AM 08/28/2023    4:17 AM 08/27/2023    6:47 PM  CBC  WBC 4.0 - 10.5 K/uL 8.5  10.4  14.6   Hemoglobin 12.0 - 15.0 g/dL 16.1  09.6  04.5   Hematocrit 36.0 - 46.0 % 32.9  31.0  33.3   Platelets 150 - 400 K/uL 307  284  314       Latest Ref Rng & Units 08/29/2023    4:41 AM 08/28/2023    4:17 AM 08/27/2023    6:47 PM  CMP  Glucose 70 - 99 mg/dL 409  811  914  BUN 8 - 23 mg/dL 11  8  13    Creatinine 0.44 - 1.00 mg/dL 4.09  8.11  9.14   Sodium 135 - 145 mmol/L 130  131  125   Potassium 3.5 - 5.1 mmol/L 4.1  3.9  2.4   Chloride 98 - 111 mmol/L 97  95  83   CO2 22 - 32 mmol/L 26  27  29    Calcium 8.9 - 10.3 mg/dL 8.9  8.4  9.4   Total Protein 6.5 - 8.1 g/dL   7.5   Total Bilirubin <1.2 mg/dL   0.6   Alkaline Phos 38 - 126 U/L   84   AST 15 - 41 U/L   23   ALT 0 - 44 U/L   21        Micro Results Recent Results (from the past 240 hour(s))  Blood Culture (routine x 2)     Status: Abnormal (Preliminary result)   Collection Time: 08/27/23  6:47 PM   Specimen: Left Antecubital; Blood  Result Value Ref Range Status   Specimen Description   Final    LEFT ANTECUBITAL BLOOD Performed at [facility], [address] [place], [place] [address]    Special Requests   Final    BOTTLES DRAWN AEROBIC AND ANAEROBIC Blood Culture adequate volume Performed at [facility], [address], [place], [place] [address]    Culture  Setup Time   Final    GRAM POSITIVE COCCI IN BOTH AEROBIC AND ANAEROBIC BOTTLES Gram Stain  Report Called to,Read Back By and Verified With: L FLORES AT 1325 ON [id number] BY S DALTON Organism ID to follow CRITICAL RESULT CALLED TO, READ BACK BY AND VERIFIED WITH: S STONE,RN@0155  08/29/23 MK    Culture (A)  Final    STAPHYLOCOCCUS EPIDERMIDIS THE SIGNIFICANCE OF ISOLATING THIS ORGANISM FROM A SINGLE SET OF BLOOD CULTURES WHEN MULTIPLE SETS ARE DRAWN IS UNCERTAIN. PLEASE NOTIFY THE MICROBIOLOGY DEPARTMENT WITHIN ONE WEEK IF SPECIATION AND SENSITIVITIES ARE REQUIRED. Performed at [facility], [address] [place], [place] [address]    Report Status PENDING  Incomplete  Blood Culture ID Panel (Reflexed)     Status: Abnormal   Collection Time: 08/27/23  6:47 PM  Result Value Ref Range Status   Enterococcus faecalis NOT DETECTED NOT DETECTED Final   Enterococcus Faecium NOT DETECTED NOT DETECTED Final   Listeria monocytogenes NOT DETECTED NOT DETECTED Final   Staphylococcus species DETECTED (A) NOT DETECTED Final    Comment: CRITICAL RESULT CALLED TO, READ BACK BY AND VERIFIED WITH: S STONE,RN@0155  08/29/23 MK    Staphylococcus aureus (BCID) NOT DETECTED NOT DETECTED Final   Staphylococcus epidermidis DETECTED (A) NOT DETECTED Final    Comment: CRITICAL RESULT CALLED TO, READ BACK BY AND VERIFIED WITH: S STONE,RN@0155  08/29/23 MK    Staphylococcus lugdunensis NOT DETECTED NOT DETECTED Final   Streptococcus species NOT DETECTED NOT DETECTED Final   Streptococcus agalactiae NOT DETECTED NOT DETECTED Final   Streptococcus pneumoniae NOT DETECTED NOT DETECTED Final   Streptococcus pyogenes NOT DETECTED NOT DETECTED Final   A.calcoaceticus-baumannii NOT DETECTED NOT DETECTED Final   Bacteroides fragilis NOT DETECTED NOT DETECTED Final   Enterobacterales NOT DETECTED NOT DETECTED Final   Enterobacter cloacae complex NOT DETECTED NOT DETECTED Final   Escherichia coli NOT DETECTED NOT DETECTED Final   Klebsiella aerogenes NOT DETECTED NOT DETECTED Final   Klebsiella oxytoca  NOT DETECTED NOT DETECTED Final   Klebsiella pneumoniae NOT DETECTED NOT DETECTED  Final   Proteus species NOT DETECTED NOT DETECTED Final   Salmonella species NOT DETECTED NOT DETECTED Final   Serratia marcescens NOT DETECTED NOT DETECTED Final   Haemophilus influenzae NOT DETECTED NOT DETECTED Final   Neisseria meningitidis NOT DETECTED NOT DETECTED Final   Pseudomonas aeruginosa NOT DETECTED NOT DETECTED Final   Stenotrophomonas maltophilia NOT DETECTED NOT DETECTED Final   Candida albicans NOT DETECTED NOT DETECTED Final   Candida auris NOT DETECTED NOT DETECTED Final   Candida glabrata NOT DETECTED NOT DETECTED Final   Candida krusei NOT DETECTED NOT DETECTED Final   Candida parapsilosis NOT DETECTED NOT DETECTED Final   Candida tropicalis NOT DETECTED NOT DETECTED Final   Cryptococcus neoformans/gattii NOT DETECTED NOT DETECTED Final   Methicillin resistance mecA/C NOT DETECTED NOT DETECTED Final    Comment: Performed at [facility], [address] [place], [place] [address]  Blood Culture (routine x 2)     Status: None (Preliminary result)   Collection Time: 08/27/23  6:52 PM   Specimen: Right Antecubital; Blood  Result Value Ref Range Status   Specimen Description RIGHT ANTECUBITAL BLOOD  Final   Special Requests   Final    BOTTLES DRAWN AEROBIC AND ANAEROBIC Blood Culture adequate volume   Culture   Final    NO GROWTH 2 DAYS Performed at [facility], [address], [place], [place] [address]    Report Status PENDING  Incomplete  Resp panel by RT-PCR (RSV, Flu A&B, Covid) Anterior Nasal Swab     Status: Abnormal   Collection Time: 08/27/23  8:03 PM   Specimen: Anterior Nasal Swab  Result Value Ref Range Status   SARS Coronavirus 2 by RT PCR NEGATIVE NEGATIVE Final    Comment: (NOTE) SARS-CoV-2 target nucleic acids are NOT DETECTED.  The SARS-CoV-2 RNA is generally detectable in upper respiratory specimens during the acute phase of infection. The  lowest concentration of SARS-CoV-2 viral copies this assay can detect is 138 copies/mL. A negative result does not preclude SARS-Cov-2 infection and should not be used as the sole basis for treatment or other patient management decisions. A negative result may occur with  improper specimen collection/handling, submission of specimen other than nasopharyngeal swab, presence of viral mutation(s) within the areas targeted by this assay, and inadequate number of viral copies(<138 copies/mL). A negative result must be combined with clinical observations, patient history, and epidemiological information. The expected result is Negative.  Fact Sheet for Patients:  BloggerCourse.com  Fact Sheet for Healthcare Providers:  SeriousBroker.it  This test is no t yet approved or cleared by the [place] FDA and  has been authorized for detection and/or diagnosis of SARS-CoV-2 by FDA under an Emergency Use Authorization (EUA). This EUA will remain  in effect (meaning this test can be used) for the duration of the COVID-19 declaration under Section 564(b)(1) of the Act, 21 U.S.C.section 360bbb-3(b)(1), unless the authorization is terminated  or revoked sooner.       Influenza A by PCR NEGATIVE NEGATIVE Final   Influenza B by PCR NEGATIVE NEGATIVE Final    Comment: (NOTE) The Xpert Xpress SARS-CoV-2/FLU/RSV plus assay is intended as an aid in the diagnosis of influenza from Nasopharyngeal swab specimens and should not be used as a sole basis for treatment. Nasal washings and aspirates are unacceptable for Xpert Xpress SARS-CoV-2/FLU/RSV testing.  Fact Sheet for Patients: BloggerCourse.com  Fact Sheet for Healthcare Providers: SeriousBroker.it  This test is not yet approved or cleared by the [place] FDA and has been authorized for detection and/or diagnosis of SARS-CoV-2 by FDA under  an Emergency Use Authorization (EUA). This EUA will remain in effect (meaning this test can be used) for the duration of the COVID-19 declaration under Section 564(b)(1) of the Act, 21 U.S.C. section 360bbb-3(b)(1), unless the authorization is terminated or revoked.     Resp Syncytial Virus by PCR POSITIVE (A) NEGATIVE Final    Comment: (NOTE) Fact Sheet for Patients: BloggerCourse.com  Fact Sheet for Healthcare Providers: SeriousBroker.it  This test is not yet approved or cleared by the [place] FDA and has been authorized for detection and/or diagnosis of SARS-CoV-2 by FDA under an Emergency Use Authorization (EUA). This EUA will remain in effect (meaning this test can be used) for the duration of the COVID-19 declaration under Section 564(b)(1) of the Act, 21 U.S.C. section 360bbb-3(b)(1), unless the authorization is terminated or revoked.  Performed at [facility], [address], [place], [place] [address]   Culture, blood (Routine X 2) w Reflex to ID Panel     Status: None (Preliminary result)   Collection Time: 08/29/23  4:35 AM   Specimen: BLOOD LEFT HAND  Result Value Ref Range Status   Specimen Description   Final    BLOOD LEFT HAND BOTTLES DRAWN AEROBIC AND ANAEROBIC   Special Requests   Final    Blood Culture results may not be optimal due to an inadequate volume of blood received in culture bottles   Culture   Final    NO GROWTH <12 HOURS Performed at [facility], [address] [place], [place] [address]    Report Status PENDING  Incomplete  Culture, blood (Routine X 2) w Reflex to ID Panel     Status: None (Preliminary result)   Collection Time: 08/29/23  4:41 AM   Specimen: BLOOD RIGHT HAND  Result Value Ref Range Status   Specimen Description   Final    BLOOD RIGHT HAND BOTTLES DRAWN AEROBIC AND ANAEROBIC   Special Requests Blood Culture adequate volume  Final   Culture   Final    NO GROWTH <12  HOURS Performed at [facility], [address] [place], [place] [address]    Report Status PENDING  Incomplete    Radiology Reports No results found.  SIGNED: Kendell Bane, MD, FHM. FAAFP. [facility] G[facility] - [facility] hospitalist Time spent - 55 min.  In seeing, evaluating and examining the patient. Reviewing medical records, labs, drawn plan of care. [facility],  Pager (please use [email or web address] to page/ text) Please use Epic Secure Chat for non-urgent communication (7AM-7PM)  If 7PM-7AM, please contact night-coverage [email or web address], 08/29/2023, 10:51 AM

## 2023-08-30 DIAGNOSIS — A419 Sepsis, unspecified organism: Secondary | ICD-10-CM | POA: Diagnosis not present

## 2023-08-30 LAB — CBC
HCT: 33.9 % — ABNORMAL LOW (ref 36.0–46.0)
Hemoglobin: 11 g/dL — ABNORMAL LOW (ref 12.0–15.0)
MCH: 31.5 pg (ref 26.0–34.0)
MCHC: 32.4 g/dL (ref 30.0–36.0)
MCV: 97.1 fL (ref 80.0–100.0)
Platelets: 365 10*3/uL (ref 150–400)
RBC: 3.49 MIL/uL — ABNORMAL LOW (ref 3.87–5.11)
RDW: 13.4 % (ref 11.5–15.5)
WBC: 10.4 10*3/uL (ref 4.0–10.5)
nRBC: 0 % (ref 0.0–0.2)

## 2023-08-30 LAB — CULTURE, BLOOD (ROUTINE X 2): Special Requests: ADEQUATE

## 2023-08-30 LAB — BASIC METABOLIC PANEL
Anion gap: 10 (ref 5–15)
BUN: 11 mg/dL (ref 8–23)
CO2: 25 mmol/L (ref 22–32)
Calcium: 9 mg/dL (ref 8.9–10.3)
Chloride: 96 mmol/L — ABNORMAL LOW (ref 98–111)
Creatinine, Ser: 0.77 mg/dL (ref 0.44–1.00)
GFR, Estimated: >60 mL/min (ref >60)
Glucose, Bld: 143 mg/dL — ABNORMAL HIGH (ref 70–99)
Potassium: 4.1 mmol/L (ref 3.5–5.1)
Sodium: 131 mmol/L — ABNORMAL LOW (ref 135–145)

## 2023-08-30 MED ORDER — HYDROCOD POLI-CHLORPHE POLI ER 10-8 MG/5ML PO SUER
5.0000 mL | Freq: Two times a day (BID) | ORAL | Status: DC
  Administered 2023-08-30 – 2023-08-31 (×3): 5 mL via ORAL
  Filled 2023-08-30 (×3): qty 5

## 2023-08-30 MED ORDER — ONDANSETRON HCL 4 MG PO TABS: 4.0000 mg | ORAL_TABLET | Freq: Three times a day (TID) | ORAL | Status: DC | PRN

## 2023-08-30 MED ORDER — BENZONATATE 100 MG PO CAPS
200.0000 mg | ORAL_CAPSULE | Freq: Three times a day (TID) | ORAL | Status: DC
  Administered 2023-08-30 – 2023-08-31 (×4): 200 mg via ORAL
  Filled 2023-08-30 (×4): qty 2

## 2023-08-30 MED ORDER — METHYLPREDNISOLONE SODIUM SUCC 40 MG IJ SOLR
40.0000 mg | Freq: Every day | INTRAMUSCULAR | Status: DC
  Administered 2023-08-30 – 2023-08-31 (×2): 40 mg via INTRAVENOUS
  Filled 2023-08-30 (×2): qty 1

## 2023-08-30 MED ORDER — HYDROMORPHONE HCL 1 MG/ML IJ SOLN: 0.5000 mg | INTRAMUSCULAR | Status: DC | PRN

## 2023-08-30 MED ORDER — AMLODIPINE BESYLATE 5 MG PO TABS
10.0000 mg | ORAL_TABLET | Freq: Every day | ORAL | Status: DC
  Administered 2023-08-30 – 2023-08-31 (×2): 10 mg via ORAL
  Filled 2023-08-30 (×2): qty 2

## 2023-08-30 NOTE — Care Management Important Message (Signed)
Important Message  Patient Details  Name: Victoria Mccarthy MRN: [id number] Date of Birth: 02/29/1946   Important Message Given:  Yes - Medicare IM (reviewed letter by phone, no additional copy needed)     Corey Harold 08/30/2023, 9:12 AM

## 2023-08-30 NOTE — Care Management Important Message (Signed)
Important Message  Patient Details  Name: Victoria Mccarthy MRN: [id number] Date of Birth: [birth year]   Important Message Given:  N/A - LOS <3 / Initial given by admissions     Corey Harold 08/30/2023, 8:59 AM

## 2023-08-30 NOTE — Progress Notes (Signed)
PROGRESS NOTE    Patient: Victoria Mccarthy                            PCP: Shawnie Dapper, PA-C                    DOB: [birth year]            DOA: 08/27/2023 ZOX:[id number]             DOS: 08/30/2023, 11:42 AM   LOS: 3 days   Date of Service: The patient was seen and examined on 08/30/2023  Subjective:   The patient was seen and examined, complaining of persistent cough, headaches, shortness of breath at rest. Stating cough medications are not helping. Satting 98% on room air  Brief Narrative:   Victoria Mccarthy  is a 77 y.o. female, with past medical history of rheumatic fever, neuropathy, anxiety, depression, arthritis, bipolar disorder, hypertension, hyperlipidemia, she presents to ED secondary to complaints of generalized weakness, fatigue, shortness of breath and cough, reports she was seen by her PCP recently, she was started on prednisone taper, but daughter reports patient has been more confused, with very poor appetite and oral intake, cough became productive with clear phlegm, she did report some dyspnea with minimal exertion which prompted the daughter to come to ED, patient initially denies any urinary symptoms, but then she does endorse increased frequency,. -In ED her workup significant for positive RSV, but her chest x-ray with no pneumonia, labs were significant for hyponatremia 125, severe hypokalemia at 2.4, with low magnesium at 1.3, leukocytosis at 14.6, she was febrile 100.8, UA was positive, [facility]ist consulted to admit.     Assessment & Plan:     Principal Problem:   Sepsis (HCC) Active Problems:   Essential hypertension   Bipolar affective (HCC)   RSV (respiratory syncytial virus pneumonia)   Sepsis secondary to UTI and upper respiratory infection -Resolved -Improved sepsis physiology-hemodynamically stable    - POA: Temp.  100.8,, tachypneic with altered mental status and leukocytosis -Likely UTI/upper respiratory infection -Follow on urine  cultures and blood cultures -Continue with IV Rocephin   RSV infection -with acute respiratory distress -Continue to have unrelenting congestion and cough -No signs of hypoxia, satting 90% room air -Adding Tussionex, guaifenesin, Tessalon Perles  - but no hypoxia, no signs of pneumonia, continue with supportive care -Continue steroids,  DuoNeb bronchodilators    Hypertension -In the setting of hypokalemia -Hold hydrochlorothiazide  -Continue Norvasc increasing to 10 mg p.o. daily -As needed IV hydralazine   Severe hypokalemia Serum potassium level 2.4, 3.9, 4.1 -Continue with replacement, hold hydrochlorothiazide, monitor telemetry  -Monitor, improved   Severe hypomagnesemia -Repleted   Bipolar disorder -On  Prozac and Zyprexa,    Hyponatremia -POA: 125>>> 131 -Improving -Due to volume depletion and dehydration, continue with IV fluids    ------------------------------------------------------------------------------------------------------------------------------------------ Nutritional status:  The patient's BMI is: Body mass index is 35.19 kg/m. I agree with the assessment and plan as outlined  -------------------------------------------------------------------------------------------------------------------------------------------- Cultures; 08/27/2023 blood Cultures x 2 >> Staph epidermidis 08/29/2023 blood cultures>>>   Sputum Culture >> NGT    ------------------------------------------------------------------------------------------------------------------------------------------------  DVT prophylaxis:  heparin injection 5,000 Units Start: 08/28/23 0600   Code Status:   Code Status: Full Code  Family Communication: No family member present at bedside-  -Advance care planning has been discussed.   Admission status:   Status is: Inpatient Remains inpatient appropriate because: Eating IV fluid  IV antibiotics   Disposition: From  - home              Planning for discharge in 1-2 days: to   Procedures:   No admission procedures for hospital encounter.   Antimicrobials:  Anti-infectives (From admission, onward)    Start     Dose/Rate Route Frequency Ordered Stop   08/28/23 1400  cefTRIAXone (ROCEPHIN) 1 g in sodium chloride 0.9 % 100 mL IVPB        1 g 200 mL/hr over 30 Minutes Intravenous Every 24 hours 08/27/23 2140     08/27/23 2000  cefTRIAXone (ROCEPHIN) 1 g in sodium chloride 0.9 % 100 mL IVPB        1 g 200 mL/hr over 30 Minutes Intravenous  Once 08/27/23 1958 08/27/23 2056        Medication:   albuterol  2.5 mg Nebulization TID   amLODipine  10 mg Oral Daily   atorvastatin  20 mg Oral Daily   benzonatate  200 mg Oral TID   chlorpheniramine-HYDROcodone  5 mL Oral Q12H   famotidine  40 mg Oral Daily   FLUoxetine  40 mg Oral Daily   guaiFENesin-dextromethorphan  10 mL Oral Q8H   heparin  5,000 Units Subcutaneous Q8H   methylPREDNISolone (SOLU-MEDROL) injection  40 mg Intravenous Daily   OLANZapine  10 mg Oral QHS    acetaminophen **OR** acetaminophen, ALPRAZolam, hydrALAZINE, HYDROmorphone (DILAUDID) injection, nitroGLYCERIN, ondansetron, traMADol   Objective:   Vitals:   08/29/23 2041 08/30/23 0354 08/30/23 0748 08/30/23 0900  BP:  (!) 185/76  (!) 144/75  Pulse:  84  72  Resp:  20    Temp:  99.1 F (37.3 C)    TempSrc:      SpO2: 98% 98% 98%   Weight:      Height:        Intake/Output Summary (Last 24 hours) at 08/30/2023 1142 Last data filed at 08/30/2023 0300 Gross per 24 hour  Intake 600 ml  Output --  Net 600 ml   Filed Weights   08/27/23 1759 08/27/23 2150 08/27/23 2300  Weight: 95.3 kg 92.6 kg 93 kg     Physical examination:        General:  AAO x 3,  cooperative, no distress;   HEENT:  Normocephalic, PERRL, otherwise with in Normal limits   Neuro:  CNII-XII intact. , normal motor and sensation, reflexes intact   Lungs:   Clear to auscultation BL, Respirations unlabored,   Diffusely wheezing, rhonchi, no crackles  Cardio:    S1/S2, RRR, No murmure, No Rubs or Gallops   Abdomen:  Soft, non-tender, bowel sounds active all four quadrants, no guarding or peritoneal signs.  Muscular  skeletal:  Limited exam -global generalized weaknesses - in bed, able to move all 4 extremities,   2+ pulses,  symmetric, No pitting edema  Skin:  Dry, warm to touch, negative for any Rashes,  Wounds: Please see nursing documentation         ------------------------------------------------------------------------------------------------------------------------------------------    LABs:     Latest Ref Rng & Units 08/30/2023    4:17 AM 08/29/2023    4:41 AM 08/28/2023    4:17 AM  CBC  WBC 4.0 - 10.5 K/uL 10.4  8.5  10.4   Hemoglobin 12.0 - 15.0 g/dL 95.2  84.1  32.4   Hematocrit 36.0 - 46.0 % 33.9  32.9  31.0   Platelets 150 - 400 K/uL 365  307  284  Latest Ref Rng & Units 08/30/2023    4:17 AM 08/29/2023    4:41 AM 08/28/2023    4:17 AM  CMP  Glucose 70 - 99 mg/dL 962  952  841   BUN 8 - 23 mg/dL 11  11  8    Creatinine 0.44 - 1.00 mg/dL 3.24  4.01  0.27   Sodium 135 - 145 mmol/L 131  130  131   Potassium 3.5 - 5.1 mmol/L 4.1  4.1  3.9   Chloride 98 - 111 mmol/L 96  97  95   CO2 22 - 32 mmol/L 25  26  27    Calcium 8.9 - 10.3 mg/dL 9.0  8.9  8.4        Micro Results Recent Results (from the past 240 hour(s))  Blood Culture (routine x 2)     Status: Abnormal (Preliminary result)   Collection Time: 08/27/23  6:47 PM   Specimen: Left Antecubital; Blood  Result Value Ref Range Status   Specimen Description   Final    LEFT ANTECUBITAL BLOOD Performed at [facility], [address] [place], [place] [address]    Special Requests   Final    BOTTLES DRAWN AEROBIC AND ANAEROBIC Blood Culture adequate volume Performed at [facility], [address], [place], [place] [address]    Culture  Setup Time   Final    GRAM POSITIVE COCCI IN BOTH AEROBIC AND  ANAEROBIC BOTTLES Gram Stain Report Called to,Read Back By and Verified With: L FLORES AT 1325 ON [id number] BY S DALTON Organism ID to follow CRITICAL RESULT CALLED TO, READ BACK BY AND VERIFIED WITH: S STONE,RN@0155  08/29/23 MK    Culture (A)  Final    STAPHYLOCOCCUS EPIDERMIDIS THE SIGNIFICANCE OF ISOLATING THIS ORGANISM FROM A SINGLE SET OF BLOOD CULTURES WHEN MULTIPLE SETS ARE DRAWN IS UNCERTAIN. PLEASE NOTIFY THE MICROBIOLOGY DEPARTMENT WITHIN ONE WEEK IF SPECIATION AND SENSITIVITIES ARE REQUIRED. Performed at [facility], [address], [place], [place] [address]    Report Status PENDING  Incomplete  Blood Culture ID Panel (Reflexed)     Status: Abnormal   Collection Time: 08/27/23  6:47 PM  Result Value Ref Range Status   Enterococcus faecalis NOT DETECTED NOT DETECTED Final   Enterococcus Faecium NOT DETECTED NOT DETECTED Final   Listeria monocytogenes NOT DETECTED NOT DETECTED Final   Staphylococcus species DETECTED (A) NOT DETECTED Final    Comment: CRITICAL RESULT CALLED TO, READ BACK BY AND VERIFIED WITH: S STONE,RN@0155  08/29/23 MK    Staphylococcus aureus (BCID) NOT DETECTED NOT DETECTED Final   Staphylococcus epidermidis DETECTED (A) NOT DETECTED Final    Comment: CRITICAL RESULT CALLED TO, READ BACK BY AND VERIFIED WITH: S STONE,RN@0155  08/29/23 MK    Staphylococcus lugdunensis NOT DETECTED NOT DETECTED Final   Streptococcus species NOT DETECTED NOT DETECTED Final   Streptococcus agalactiae NOT DETECTED NOT DETECTED Final   Streptococcus pneumoniae NOT DETECTED NOT DETECTED Final   Streptococcus pyogenes NOT DETECTED NOT DETECTED Final   A.calcoaceticus-baumannii NOT DETECTED NOT DETECTED Final   Bacteroides fragilis NOT DETECTED NOT DETECTED Final   Enterobacterales NOT DETECTED NOT DETECTED Final   Enterobacter cloacae complex NOT DETECTED NOT DETECTED Final   Escherichia coli NOT DETECTED NOT DETECTED Final   Klebsiella aerogenes NOT DETECTED NOT DETECTED  Final   Klebsiella oxytoca NOT DETECTED NOT DETECTED Final   Klebsiella pneumoniae NOT DETECTED NOT DETECTED Final   Proteus species NOT DETECTED NOT DETECTED Final   Salmonella  species NOT DETECTED NOT DETECTED Final   Serratia marcescens NOT DETECTED NOT DETECTED Final   Haemophilus influenzae NOT DETECTED NOT DETECTED Final   Neisseria meningitidis NOT DETECTED NOT DETECTED Final   Pseudomonas aeruginosa NOT DETECTED NOT DETECTED Final   Stenotrophomonas maltophilia NOT DETECTED NOT DETECTED Final   Candida albicans NOT DETECTED NOT DETECTED Final   Candida auris NOT DETECTED NOT DETECTED Final   Candida glabrata NOT DETECTED NOT DETECTED Final   Candida krusei NOT DETECTED NOT DETECTED Final   Candida parapsilosis NOT DETECTED NOT DETECTED Final   Candida tropicalis NOT DETECTED NOT DETECTED Final   Cryptococcus neoformans/gattii NOT DETECTED NOT DETECTED Final   Methicillin resistance mecA/C NOT DETECTED NOT DETECTED Final    Comment: Performed at [facility], [address], [place], [place] [address]  Blood Culture (routine x 2)     Status: None (Preliminary result)   Collection Time: 08/27/23  6:52 PM   Specimen: Right Antecubital; Blood  Result Value Ref Range Status   Specimen Description RIGHT ANTECUBITAL BLOOD  Final   Special Requests   Final    BOTTLES DRAWN AEROBIC AND ANAEROBIC Blood Culture adequate volume   Culture   Final    NO GROWTH 3 DAYS Performed at [facility], [address], [place], [place] [address]    Report Status PENDING  Incomplete  Resp panel by RT-PCR (RSV, Flu A&B, Covid) Anterior Nasal Swab     Status: Abnormal   Collection Time: 08/27/23  8:03 PM   Specimen: Anterior Nasal Swab  Result Value Ref Range Status   SARS Coronavirus 2 by RT PCR NEGATIVE NEGATIVE Final    Comment: (NOTE) SARS-CoV-2 target nucleic acids are NOT DETECTED.  The SARS-CoV-2 RNA is generally detectable in upper respiratory specimens during the acute phase of  infection. The lowest concentration of SARS-CoV-2 viral copies this assay can detect is 138 copies/mL. A negative result does not preclude SARS-Cov-2 infection and should not be used as the sole basis for treatment or other patient management decisions. A negative result may occur with  improper specimen collection/handling, submission of specimen other than nasopharyngeal swab, presence of viral mutation(s) within the areas targeted by this assay, and inadequate number of viral copies(<138 copies/mL). A negative result must be combined with clinical observations, patient history, and epidemiological information. The expected result is Negative.  Fact Sheet for Patients:  BloggerCourse.com  Fact Sheet for Healthcare Providers:  SeriousBroker.it  This test is no t yet approved or cleared by the [place] FDA and  has been authorized for detection and/or diagnosis of SARS-CoV-2 by FDA under an Emergency Use Authorization (EUA). This EUA will remain  in effect (meaning this test can be used) for the duration of the COVID-19 declaration under Section 564(b)(1) of the Act, 21 U.S.C.section 360bbb-3(b)(1), unless the authorization is terminated  or revoked sooner.       Influenza A by PCR NEGATIVE NEGATIVE Final   Influenza B by PCR NEGATIVE NEGATIVE Final    Comment: (NOTE) The Xpert Xpress SARS-CoV-2/FLU/RSV plus assay is intended as an aid in the diagnosis of influenza from Nasopharyngeal swab specimens and should not be used as a sole basis for treatment. Nasal washings and aspirates are unacceptable for Xpert Xpress SARS-CoV-2/FLU/RSV testing.  Fact Sheet for Patients: BloggerCourse.com  Fact Sheet for Healthcare Providers: SeriousBroker.it  This test is not yet approved or cleared by the [place] FDA and has been authorized for detection and/or diagnosis of SARS-CoV-2  by FDA under an Emergency Use Authorization (EUA). This EUA will remain in effect (meaning this test can be used) for the duration of the COVID-19 declaration under Section 564(b)(1) of the Act, 21 U.S.C. section 360bbb-3(b)(1), unless the authorization is terminated or revoked.     Resp Syncytial Virus by PCR POSITIVE (A) NEGATIVE Final    Comment: (NOTE) Fact Sheet for Patients: BloggerCourse.com  Fact Sheet for Healthcare Providers: SeriousBroker.it  This test is not yet approved or cleared by the [place] FDA and has been authorized for detection and/or diagnosis of SARS-CoV-2 by FDA under an Emergency Use Authorization (EUA). This EUA will remain in effect (meaning this test can be used) for the duration of the COVID-19 declaration under Section 564(b)(1) of the Act, 21 U.S.C. section 360bbb-3(b)(1), unless the authorization is terminated or revoked.  Performed at [facility], [address], [place], [place] [address]   Culture, blood (Routine X 2) w Reflex to ID Panel     Status: None (Preliminary result)   Collection Time: 08/29/23  4:35 AM   Specimen: BLOOD LEFT HAND  Result Value Ref Range Status   Specimen Description   Final    BLOOD LEFT HAND BOTTLES DRAWN AEROBIC AND ANAEROBIC   Special Requests   Final    Blood Culture results may not be optimal due to an inadequate volume of blood received in culture bottles   Culture   Final    NO GROWTH 1 DAY Performed at [facility], [address] [place], [place] [address]    Report Status PENDING  Incomplete  Culture, blood (Routine X 2) w Reflex to ID Panel     Status: None (Preliminary result)   Collection Time: 08/29/23  4:41 AM   Specimen: BLOOD RIGHT HAND  Result Value Ref Range Status   Specimen Description   Final    BLOOD RIGHT HAND BOTTLES DRAWN AEROBIC AND ANAEROBIC   Special Requests Blood Culture adequate volume  Final   Culture   Final    NO GROWTH  1 DAY Performed at [facility], [address], [place], [place] [address]    Report Status PENDING  Incomplete    Radiology Reports No results found.  SIGNED: Kendell Bane, MD, FHM. FAAFP. [facility] Gainer - [facility] hospitalist Time spent - 55 min.  In seeing, evaluating and examining the patient. Reviewing medical records, labs, drawn plan of care. [facility],  Pager (please use [email or web address] to page/ text) Please use Epic Secure Chat for non-urgent communication (7AM-7PM)  If 7PM-7AM, please contact night-coverage [email or web address], 08/30/2023, 11:42 AM

## 2023-08-30 NOTE — Plan of Care (Signed)

## 2023-08-31 DIAGNOSIS — A419 Sepsis, unspecified organism: Secondary | ICD-10-CM | POA: Diagnosis not present

## 2023-08-31 LAB — BASIC METABOLIC PANEL
Anion gap: 9 (ref 5–15)
BUN: 17 mg/dL (ref 8–23)
CO2: 24 mmol/L (ref 22–32)
Calcium: 9.2 mg/dL (ref 8.9–10.3)
Chloride: 97 mmol/L — ABNORMAL LOW (ref 98–111)
Creatinine, Ser: 0.87 mg/dL (ref 0.44–1.00)
GFR, Estimated: >60 mL/min (ref >60)
Glucose, Bld: 214 mg/dL — ABNORMAL HIGH (ref 70–99)
Potassium: 4.4 mmol/L (ref 3.5–5.1)
Sodium: 130 mmol/L — ABNORMAL LOW (ref 135–145)

## 2023-08-31 LAB — CBC
HCT: 32.3 % — ABNORMAL LOW (ref 36.0–46.0)
Hemoglobin: 10.3 g/dL — ABNORMAL LOW (ref 12.0–15.0)
MCH: 30.8 pg (ref 26.0–34.0)
MCHC: 31.9 g/dL (ref 30.0–36.0)
MCV: 96.7 fL (ref 80.0–100.0)
Platelets: 357 10*3/uL (ref 150–400)
RBC: 3.34 MIL/uL — ABNORMAL LOW (ref 3.87–5.11)
RDW: 13.4 % (ref 11.5–15.5)
WBC: 11 10*3/uL — ABNORMAL HIGH (ref 4.0–10.5)
nRBC: 0 % (ref 0.0–0.2)

## 2023-08-31 MED ORDER — NITROGLYCERIN 0.4 MG SL SUBL: 0.4000 mg | SUBLINGUAL_TABLET | SUBLINGUAL | 12 refills | Status: DC | PRN

## 2023-08-31 MED ORDER — FAMOTIDINE 40 MG PO TABS: 40.0000 mg | ORAL_TABLET | Freq: Every day | ORAL | 0 refills | Status: AC

## 2023-08-31 MED ORDER — GUAIFENESIN-DM 100-10 MG/5ML PO SYRP: 10.0000 mL | ORAL_SOLUTION | Freq: Three times a day (TID) | ORAL | 0 refills | Status: AC

## 2023-08-31 MED ORDER — BENZONATATE 200 MG PO CAPS: 200.0000 mg | ORAL_CAPSULE | Freq: Three times a day (TID) | ORAL | 0 refills | Status: AC

## 2023-08-31 MED ORDER — METHYLPREDNISOLONE 4 MG PO TBPK: ORAL_TABLET | ORAL | 0 refills | Status: AC

## 2023-08-31 MED ORDER — LEVOFLOXACIN 750 MG PO TABS: 750.0000 mg | ORAL_TABLET | Freq: Every day | ORAL | 0 refills | Status: AC

## 2023-08-31 MED ORDER — HYDROCOD POLI-CHLORPHE POLI ER 10-8 MG/5ML PO SUER: 5.0000 mL | Freq: Two times a day (BID) | ORAL | 0 refills | Status: AC

## 2023-08-31 MED ORDER — AMLODIPINE BESYLATE 10 MG PO TABS: 10.0000 mg | ORAL_TABLET | Freq: Every day | ORAL | 1 refills | Status: AC

## 2023-08-31 NOTE — Discharge Summary (Addendum)
Physician Discharge Summary   Patient: Victoria Mccarthy MRN: [id number] DOB: [birth year]  Admit date:     08/27/2023  Discharge date: 08/31/23  Discharge Physician: Kendell Bane   PCP: Shawnie Dapper, PA-C   Recommendations at discharge:   Follow-up with PCP in 1 to 2 weeks Continue current treatment for upper respiratory infection pneumonia/ RSV Bleed blood cultures are contaminant -follow-up with formal cultures with PCP  Discharge Diagnoses: Principal Problem:   Sepsis (HCC) Active Problems:   Essential hypertension   Bipolar affective (HCC)   RSV (respiratory syncytial virus pneumonia)  Resolved Problems:   * No resolved hospital problems. *  Hospital Course: Victoria Mccarthy  is a 77 y.o. female, with past medical history of rheumatic fever, neuropathy, anxiety, depression, arthritis, bipolar disorder, hypertension, hyperlipidemia, she presents to ED secondary to complaints of generalized weakness, fatigue, shortness of breath and cough, reports she was seen by her PCP recently, she was started on prednisone taper, but daughter reports patient has been more confused, with very poor appetite and oral intake, cough became productive with clear phlegm, she did report some dyspnea with minimal exertion which prompted the daughter to come to ED, patient initially denies any urinary symptoms, but then she does endorse increased frequency,. -In ED her workup significant for positive RSV, but her chest x-ray with no pneumonia, labs were significant for hyponatremia 125, severe hypokalemia at 2.4, with low magnesium at 1.3, leukocytosis at 14.6, she was febrile 100.8, UA was positive, [facility]ist consulted to admit.  Sepsis secondary to UTI and upper respiratory infection -Resolved -Improved sepsis physiology-hemodynamically stable    08/27/2023 blood Cultures x 2 >> Staph epidermidis-likely contaminant 08/29/2023 blood cultures>>> no growth to date  - POA: Temp.  100.8,,  tachypneic with altered mental status and leukocytosis -Likely UTI/upper respiratory infection -Follow on urine cultures and blood cultures -Continue with IV Rocephin>>> changing to PO Levaquin    RSV infection -with acute respiratory distress -Continue to have unrelenting congestion and cough -was satting 90% room air >>> now satting 98% on RA  -Cont. Tussionex, guaifenesin, Tessalon Perles   - but no hypoxia, no signs of pneumonia, continue with supportive care -Continue steroids -finished a Medrol dose pack       Hypertension -In the setting of hypokalemia -D/Ced hydrochlorothiazide  -Continue Norvasc increasing to 10 mg p.o. daily    Severe hypokalemia Serum potassium level 2.4, 3.9, 4.1 -Continue with replacement, hold hydrochlorothiazide, monitor telemetry  -Monitor, improved   Severe hypomagnesemia -Repleted   Bipolar disorder -On  Prozac and Zyprexa,    Hyponatremia -POA: 125>>> 130  -Improving -Due to volume depletion and dehydration, S/p  IV fluids     ------------------------------------------------------------------------------------------------------------------------------------------ Nutritional status:  The patient's BMI is: Body mass index is 35.19 kg/m. I agree with the assessment and plan as outlined  -------------------------------------------------------------------------------------------------------------------------------------------- Cultures; 08/27/2023 blood Cultures x 2 >> Staph epidermidis 08/29/2023 blood cultures>>> no growth to date   Sputum Culture >> NGT) response    Procedures performed: None    Disposition: Home Diet recommendation:  Regular diet DISCHARGE MEDICATION: Allergies as of 08/31/2023       Reactions   Shellfish Allergy Anaphylaxis   Neurontin [gabapentin] Other (See Comments)   Unable to talk    Darvon [propoxyphene] Nausea And Vomiting   Dilaudid [hydromorphone] Nausea And Vomiting   Codeine Itching    Haldol [haloperidol Lactate] Other (See Comments)   Muscle spasms   Hibiclens [chlorhexidine Gluconate] Rash, Other (See Comments)  Skin burning   Latex Rash   adhesives   Vicodin [hydrocodone-acetaminophen] Itching   Wound Dressing Adhesive Rash        Medication List     STOP taking these medications    hydrochlorothiazide 25 MG tablet Commonly known as: HYDRODIURIL       TAKE these medications    ALPRAZolam 1 MG tablet Commonly known as: XANAX Take 1 tablet (1 mg total) by mouth 2 (two) times daily.   amLODipine 10 MG tablet Commonly known as: NORVASC Take 1 tablet (10 mg total) by mouth daily. Start taking on: September 01, 2023   benzonatate 200 MG capsule Commonly known as: TESSALON Take 1 capsule (200 mg total) by mouth 3 (three) times daily.   chlorpheniramine-HYDROcodone 10-8 MG/5ML Commonly known as: TUSSIONEX Take 5 mLs by mouth every 12 (twelve) hours for 10 days.   cholestyramine 4 GM/DOSE powder Commonly known as: QUESTRAN TAKE 1 SCOOPFUL OF 4 GRAMS AND MIX WITH LIQUID OR APPLESAUCE AS DIRECTED ON PACKAGING AND TAKE BY MOUTH DAILY - DO NOT TAKE WITHIN 2 HRS OF OTHER MEDS What changed: See the new instructions.   famotidine 40 MG tablet Commonly known as: PEPCID Take 1 tablet (40 mg total) by mouth daily. Start taking on: September 01, 2023   FLUoxetine 40 MG capsule Commonly known as: PROzac Take 1 capsule (40 mg total) by mouth daily.   GOODY HEADACHE PO Take 1 packet by mouth daily as needed (headache, pain).   guaiFENesin-dextromethorphan 100-10 MG/5ML syrup Commonly known as: ROBITUSSIN DM Take 10 mLs by mouth every 8 (eight) hours.   Hair/Skin/Nails Caps Take 1 capsule by mouth daily.   levofloxacin 750 MG tablet Commonly known as: Levaquin Take 1 tablet (750 mg total) by mouth daily for 3 days.   methylPREDNISolone 4 MG Tbpk tablet Commonly known as: MEDROL DOSEPAK Medrol Dosepak take as instructed What changed:  how much to  take how to take this when to take this additional instructions   multivitamin with minerals Tabs tablet Take 1 tablet by mouth daily.   nitroGLYCERIN 0.4 MG SL tablet Commonly known as: NITROSTAT Place 1 tablet (0.4 mg total) under the tongue every 5 (five) minutes as needed for up to 3 days for chest pain.   OLANZapine 10 MG tablet Commonly known as: ZyPREXA Take 1 tablet (10 mg total) by mouth at bedtime. What changed: when to take this   oxybutynin 15 MG 24 hr tablet Commonly known as: DITROPAN XL Take 15 mg by mouth daily.   pravastatin 80 MG tablet Commonly known as: PRAVACHOL Take 80 mg by mouth daily.   rOPINIRole 4 MG tablet Commonly known as: REQUIP Take 4 mg by mouth daily.   traZODone 100 MG tablet Commonly known as: DESYREL Take 1 tablet (100 mg total) by mouth at bedtime.   VITAMIN C PO Take 1 tablet by mouth daily.   VITAMIN D3 PO Take 1 tablet by mouth daily.        Discharge Exam: Filed Weights   08/27/23 1759 08/27/23 2150 08/27/23 2300  Weight: 95.3 kg 92.6 kg 93 kg        General:  AAO x 3,  cooperative, no distress;   HEENT:  Normocephalic, PERRL, otherwise with in Normal limits   Neuro:  CNII-XII intact. , normal motor and sensation, reflexes intact   Lungs:   Clear to auscultation BL, Respirations unlabored,  No wheezes / crackles  Cardio:    S1/S2, RRR, No murmure, No  Rubs or Gallops   Abdomen:  Soft, non-tender, bowel sounds active all four quadrants, no guarding or peritoneal signs.  Muscular  skeletal:  Limited exam -global generalized weaknesses - in bed, able to move all 4 extremities,   2+ pulses,  symmetric, No pitting edema  Skin:  Dry, warm to touch, negative for any Rashes,  Wounds: Please see nursing documentation          Condition at discharge: fair  The results of significant diagnostics from this hospitalization (including imaging, microbiology, ancillary and laboratory) are listed below for reference.    Imaging Studies: DG Chest 2 View  Result Date: 08/27/2023 CLINICAL DATA:  Cough, short of breath EXAM: CHEST - 2 VIEW COMPARISON:  09/03/2015 FINDINGS: Frontal and lateral views of the chest demonstrate an unremarkable cardiac silhouette. No acute airspace disease, effusion, or pneumothorax. No acute bony abnormalities. IMPRESSION: 1. No acute intrathoracic process. Electronically Signed   By: Sharlet Salina M.D.   On: 08/27/2023 18:44    Microbiology: Results for orders placed or performed during the hospital encounter of 08/27/23  Blood Culture (routine x 2)     Status: Abnormal   Collection Time: 08/27/23  6:47 PM   Specimen: Left Antecubital; Blood  Result Value Ref Range Status   Specimen Description   Final    LEFT ANTECUBITAL BLOOD Performed at [facility], [address] [place], [place] [address]    Special Requests   Final    BOTTLES DRAWN AEROBIC AND ANAEROBIC Blood Culture adequate volume Performed at [facility], [address] [place], [place] [address]    Culture  Setup Time   Final    GRAM POSITIVE COCCI IN BOTH AEROBIC AND ANAEROBIC BOTTLES Gram Stain Report Called to,Read Back By and Verified With: L FLORES AT 1325 ON [id number] BY S DALTON Organism ID to follow CRITICAL RESULT CALLED TO, READ BACK BY AND VERIFIED WITH: S STONE,RN@0155  08/29/23 MK    Culture (A)  Final    STAPHYLOCOCCUS EPIDERMIDIS STAPHYLOCOCCUS HOMINIS THE SIGNIFICANCE OF ISOLATING THIS ORGANISM FROM A SINGLE SET OF BLOOD CULTURES WHEN MULTIPLE SETS ARE DRAWN IS UNCERTAIN. PLEASE NOTIFY THE MICROBIOLOGY DEPARTMENT WITHIN ONE WEEK IF SPECIATION AND SENSITIVITIES ARE REQUIRED. Performed at [facility], [address] [place], [place] [address]    Report Status 08/30/2023 FINAL  Final  Blood Culture ID Panel (Reflexed)     Status: Abnormal   Collection Time: 08/27/23  6:47 PM  Result Value Ref Range Status   Enterococcus faecalis NOT DETECTED NOT DETECTED Final   Enterococcus Faecium  NOT DETECTED NOT DETECTED Final   Listeria monocytogenes NOT DETECTED NOT DETECTED Final   Staphylococcus species DETECTED (A) NOT DETECTED Final    Comment: CRITICAL RESULT CALLED TO, READ BACK BY AND VERIFIED WITH: S STONE,RN@0155  08/29/23 MK    Staphylococcus aureus (BCID) NOT DETECTED NOT DETECTED Final   Staphylococcus epidermidis DETECTED (A) NOT DETECTED Final    Comment: CRITICAL RESULT CALLED TO, READ BACK BY AND VERIFIED WITH: S STONE,RN@0155  08/29/23 MK    Staphylococcus lugdunensis NOT DETECTED NOT DETECTED Final   Streptococcus species NOT DETECTED NOT DETECTED Final   Streptococcus agalactiae NOT DETECTED NOT DETECTED Final   Streptococcus pneumoniae NOT DETECTED NOT DETECTED Final   Streptococcus pyogenes NOT DETECTED NOT DETECTED Final   A.calcoaceticus-baumannii NOT DETECTED NOT DETECTED Final   Bacteroides fragilis NOT DETECTED NOT DETECTED Final   Enterobacterales NOT DETECTED NOT DETECTED Final   Enterobacter cloacae complex NOT DETECTED NOT  DETECTED Final   Escherichia coli NOT DETECTED NOT DETECTED Final   Klebsiella aerogenes NOT DETECTED NOT DETECTED Final   Klebsiella oxytoca NOT DETECTED NOT DETECTED Final   Klebsiella pneumoniae NOT DETECTED NOT DETECTED Final   Proteus species NOT DETECTED NOT DETECTED Final   Salmonella species NOT DETECTED NOT DETECTED Final   Serratia marcescens NOT DETECTED NOT DETECTED Final   Haemophilus influenzae NOT DETECTED NOT DETECTED Final   Neisseria meningitidis NOT DETECTED NOT DETECTED Final   Pseudomonas aeruginosa NOT DETECTED NOT DETECTED Final   Stenotrophomonas maltophilia NOT DETECTED NOT DETECTED Final   Candida albicans NOT DETECTED NOT DETECTED Final   Candida auris NOT DETECTED NOT DETECTED Final   Candida glabrata NOT DETECTED NOT DETECTED Final   Candida krusei NOT DETECTED NOT DETECTED Final   Candida parapsilosis NOT DETECTED NOT DETECTED Final   Candida tropicalis NOT DETECTED NOT DETECTED Final    Cryptococcus neoformans/gattii NOT DETECTED NOT DETECTED Final   Methicillin resistance mecA/C NOT DETECTED NOT DETECTED Final    Comment: Performed at [facility], [address], [place], [place] [address]  Blood Culture (routine x 2)     Status: None (Preliminary result)   Collection Time: 08/27/23  6:52 PM   Specimen: Right Antecubital; Blood  Result Value Ref Range Status   Specimen Description RIGHT ANTECUBITAL BLOOD  Final   Special Requests   Final    BOTTLES DRAWN AEROBIC AND ANAEROBIC Blood Culture adequate volume   Culture   Final    NO GROWTH 4 DAYS Performed at [facility], [address] [place], [place] [address]    Report Status PENDING  Incomplete  Urine Culture     Status: Abnormal (Preliminary result)   Collection Time: 08/27/23  7:36 PM   Specimen: Urine, Random  Result Value Ref Range Status   Specimen Description   Final    URINE, RANDOM Performed at [facility], [address] [place], [place] [address]    Special Requests   Final    NONE Reflexed from [id number] Performed at [facility], [address], [place], [place] [address]    Culture (A)  Final    >=100,000 COLONIES/mL ESCHERICHIA COLI SUSCEPTIBILITIES TO FOLLOW Performed at [facility], [address] [place], [place] [address]    Report Status PENDING  Incomplete  Resp panel by RT-PCR (RSV, Flu A&B, Covid) Anterior Nasal Swab     Status: Abnormal   Collection Time: 08/27/23  8:03 PM   Specimen: Anterior Nasal Swab  Result Value Ref Range Status   SARS Coronavirus 2 by RT PCR NEGATIVE NEGATIVE Final    Comment: (NOTE) SARS-CoV-2 target nucleic acids are NOT DETECTED.  The SARS-CoV-2 RNA is generally detectable in upper respiratory specimens during the acute phase of infection. The lowest concentration of SARS-CoV-2 viral copies this assay can detect is 138 copies/mL. A negative result does not preclude SARS-Cov-2 infection and should not be used as the sole basis for treatment  or other patient management decisions. A negative result may occur with  improper specimen collection/handling, submission of specimen other than nasopharyngeal swab, presence of viral mutation(s) within the areas targeted by this assay, and inadequate number of viral copies(<138 copies/mL). A negative result must be combined with clinical observations, patient history, and epidemiological information. The expected result is Negative.  Fact Sheet for Patients:  BloggerCourse.com  Fact Sheet for Healthcare Providers:  SeriousBroker.it  This test is no t yet approved or cleared by  the [employer] and  has been authorized for detection and/or diagnosis of SARS-CoV-2 by FDA under an Emergency Use Authorization (EUA). This EUA will remain  in effect (meaning this test can be used) for the duration of the COVID-19 declaration under Section 564(b)(1) of the Act, 21 U.S.C.section 360bbb-3(b)(1), unless the authorization is terminated  or revoked sooner.       Influenza A by PCR NEGATIVE NEGATIVE Final   Influenza B by PCR NEGATIVE NEGATIVE Final    Comment: (NOTE) The Xpert Xpress SARS-CoV-2/FLU/RSV plus assay is intended as an aid in the diagnosis of influenza from Nasopharyngeal swab specimens and should not be used as a sole basis for treatment. Nasal washings and aspirates are unacceptable for Xpert Xpress SARS-CoV-2/FLU/RSV testing.  Fact Sheet for Patients: BloggerCourse.com  Fact Sheet for Healthcare Providers: SeriousBroker.it  This test is not yet approved or cleared by the [place] FDA and has been authorized for detection and/or diagnosis of SARS-CoV-2 by FDA under an Emergency Use Authorization (EUA). This EUA will remain in effect (meaning this test can be used) for the duration of the COVID-19 declaration under Section 564(b)(1) of the Act, 21 U.S.C. section  360bbb-3(b)(1), unless the authorization is terminated or revoked.     Resp Syncytial Virus by PCR POSITIVE (A) NEGATIVE Final    Comment: (NOTE) Fact Sheet for Patients: BloggerCourse.com  Fact Sheet for Healthcare Providers: SeriousBroker.it  This test is not yet approved or cleared by the [place] FDA and has been authorized for detection and/or diagnosis of SARS-CoV-2 by FDA under an Emergency Use Authorization (EUA). This EUA will remain in effect (meaning this test can be used) for the duration of the COVID-19 declaration under Section 564(b)(1) of the Act, 21 U.S.C. section 360bbb-3(b)(1), unless the authorization is terminated or revoked.  Performed at [facility], [address] [place], [place] [address]   Culture, blood (Routine X 2) w Reflex to ID Panel     Status: None (Preliminary result)   Collection Time: 08/29/23  4:35 AM   Specimen: BLOOD LEFT HAND  Result Value Ref Range Status   Specimen Description   Final    BLOOD LEFT HAND BOTTLES DRAWN AEROBIC AND ANAEROBIC   Special Requests   Final    Blood Culture results may not be optimal due to an inadequate volume of blood received in culture bottles   Culture   Final    NO GROWTH 2 DAYS Performed at [facility], [address], [place], [place] [address]    Report Status PENDING  Incomplete  Culture, blood (Routine X 2) w Reflex to ID Panel     Status: None (Preliminary result)   Collection Time: 08/29/23  4:41 AM   Specimen: BLOOD RIGHT HAND  Result Value Ref Range Status   Specimen Description   Final    BLOOD RIGHT HAND BOTTLES DRAWN AEROBIC AND ANAEROBIC   Special Requests Blood Culture adequate volume  Final   Culture   Final    NO GROWTH 2 DAYS Performed at [facility], [address], [place], [place] [address]    Report Status PENDING  Incomplete    Labs: CBC: Recent Labs  Lab 08/27/23 1847 08/28/23 0417 08/29/23 0441 08/30/23 0417  08/31/23 0423  WBC 14.6* 10.4 8.5 10.4 11.0*  NEUTROABS 11.7*  --   --   --   --   HGB 11.8* 10.5* 10.7* 11.0* 10.3*  HCT 33.3* 31.0* 32.9* 33.9* 32.3*  MCV 91.0 94.2 95.1  97.1 96.7  PLT 314 284 307 365 357   Basic Metabolic Panel: Recent Labs  Lab 08/27/23 1847 08/28/23 0417 08/29/23 0441 08/30/23 0417 08/31/23 0423  NA 125* 131* 130* 131* 130*  K 2.4* 3.9 4.1 4.1 4.4  CL 83* 95* 97* 96* 97*  CO2 29 27 26 25 24   GLUCOSE 207* 163* 163* 143* 214*  BUN 13 8 11 11 17   CREATININE 0.85 0.69 0.72 0.77 0.87  CALCIUM 9.4 8.4* 8.9 9.0 9.2  MG 1.3* 2.6*  --   --   --    Liver Function Tests: Recent Labs  Lab 08/27/23 1847  AST 23  ALT 21  ALKPHOS 84  BILITOT 0.6  PROT 7.5  ALBUMIN 3.4*   CBG: No results for input(s): "GLUCAP" in the last 168 hours.  Discharge time spent: greater than 30 minutes.  Signed: Kendell Bane, MD [facility] 08/31/2023

## 2023-08-31 NOTE — Progress Notes (Signed)
Mobility Specialist Progress Note:    08/31/23 1000  Mobility  Activity Ambulated with assistance in room;Transferred from bed to chair  Level of Assistance Standby assist, set-up cues, supervision of patient - no hands on  Assistive Device None  Distance Ambulated (ft) 30 ft  Range of Motion/Exercises Active;All extremities  Activity Response Tolerated well  Mobility Referral Yes  Mobility visit 1 Mobility  Mobility Specialist Start Time (ACUTE ONLY) 0950  Mobility Specialist Stop Time (ACUTE ONLY) 1000  Mobility Specialist Time Calculation (min) (ACUTE ONLY) 10 min   Pt received in bed,agreeable to mobility. Required SBA to stand and ambulate with no AD. Tolerated well, asx throughout. Left pt in chair, alarm on. Call bell in reach, all needs met.   [name] Mobility Specialist Please contact via Special educational needs teacher or  Rehab office at [phone]

## 2023-08-31 NOTE — Plan of Care (Signed)
  Problem: Health Behavior/Discharge Planning: Goal: Ability to manage health-related needs will improve Outcome: Progressing   Problem: Clinical Measurements: Goal: Respiratory complications will improve Outcome: Progressing   Problem: Activity: Goal: Risk for activity intolerance will decrease Outcome: Progressing   

## 2023-09-01 LAB — URINE CULTURE: Culture: >=100000 — AB

## 2023-09-01 LAB — CULTURE, BLOOD (ROUTINE X 2): Special Requests: ADEQUATE

## 2023-09-03 LAB — CULTURE, BLOOD (ROUTINE X 2)
Culture: NO GROWTH
Culture: NO GROWTH
Special Requests: ADEQUATE

## 2023-11-21 ENCOUNTER — Telehealth (HOSPITAL_COMMUNITY): Payer: 59 | Admitting: Psychiatry

## 2023-11-30 ENCOUNTER — Other Ambulatory Visit (HOSPITAL_COMMUNITY): Payer: Self-pay | Admitting: Psychiatry

## 2023-12-01 NOTE — Telephone Encounter (Signed)
 Call for appt

## 2023-12-09 NOTE — Telephone Encounter (Signed)
 Lmom

## 2023-12-14 ENCOUNTER — Encounter (INDEPENDENT_AMBULATORY_CARE_PROVIDER_SITE_OTHER): Payer: Self-pay | Admitting: *Deleted

## 2024-02-16 ENCOUNTER — Encounter (HOSPITAL_COMMUNITY): Payer: Self-pay | Admitting: Psychiatry

## 2024-02-16 ENCOUNTER — Telehealth (INDEPENDENT_AMBULATORY_CARE_PROVIDER_SITE_OTHER): Admitting: Psychiatry

## 2024-02-16 DIAGNOSIS — F3130 Bipolar disorder, current episode depressed, mild or moderate severity, unspecified: Secondary | ICD-10-CM

## 2024-02-16 MED ORDER — OLANZAPINE 10 MG PO TABS: 10.0000 mg | ORAL_TABLET | Freq: Every day | ORAL | 2 refills | Status: DC

## 2024-02-16 MED ORDER — FLUOXETINE HCL 40 MG PO CAPS: 40.0000 mg | ORAL_CAPSULE | Freq: Every day | ORAL | 2 refills | Status: DC

## 2024-02-16 MED ORDER — ALPRAZOLAM 1 MG PO TABS: 1.0000 mg | ORAL_TABLET | Freq: Two times a day (BID) | ORAL | 3 refills | Status: DC

## 2024-02-16 MED ORDER — TRAZODONE HCL 100 MG PO TABS: 100.0000 mg | ORAL_TABLET | Freq: Every day | ORAL | 2 refills | Status: DC

## 2024-02-16 NOTE — Progress Notes (Signed)
 Virtual Visit via Telephone Note  I connected with Victoria Mccarthy on 02/16/24 at  2:40 PM EDT by telephone and verified that I am speaking with the correct person using two identifiers.  Location: Patient: home Provider: office   I discussed the limitations, risks, security and privacy concerns of performing an evaluation and management service by telephone and the availability of in person appointments. I also discussed with the patient that there may be a patient responsible charge related to this service. The patient expressed understanding and agreed to proceed.      I discussed the assessment and treatment plan with the patient. The patient was provided an opportunity to ask questions and all were answered. The patient agreed with the plan and demonstrated an understanding of the instructions.   The patient was advised to call back or seek an in-person evaluation if the symptoms worsen or if the condition fails to improve as anticipated.  I provided 20 minutes of non-face-to-face time during this encounter.   Victoria Annas, MD  [facility] MD/PA/NP OP Progress Note  02/16/2024 2:55 PM Victoria Mccarthy  MRN:  [id number]  Chief Complaint:  Chief Complaint  Patient presents with   Depression   Anxiety   Manic Behavior   Follow-up   HPI: This patient is a 78 year old white female who lives with her daughter in [place].  She has 2 children.  She is a retired Psychologist, counselling.  The patient and her daughter Victoria Mccarthy return for follow-up after 6 months regarding her bipolar disorder and memory loss.  She is concerned because she ran out of her Xanax  a few days ago and now feels more anxious and worried.  She has not run out of her other psychiatric medications.  She was hospitalized last December for hypokalemia but unfortunately is still taking the hydrochlorothiazide that they had told her to stop.  I urged Victoria Mccarthy to talk to her mother's PCP about this.  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  Her son is against it and they are often in disputes over it.  She is generally sleeping well and she denies manic symptoms like racing thoughts delusions paranoia hallucinations.  Victoria Mccarthy states that her mother's memory actually has been fairly good lately. Visit Diagnosis:    ICD-10-CM   1. Bipolar I disorder, most recent episode depressed (HCC)  F31.30       Past Psychiatric History: Psychiatric hospitalization in the 1980s, since then outpatient treatment for bipolar disorder  Past Medical History:  Past Medical History:  Diagnosis Date   Anemia    hx   Anxiety    Arthritis    chronic back pain   Bipolar disorder (HCC)    Cancer (HCC) Jan 2013   kidney cancer, left, s/p partial nephrectomy   Depression    Diarrhea    incontinent stools   Diverticulitis    treated at least 5 times in past, hospitalized twice   Heart murmur    History of hiatal hernia    s/p   MVP (mitral valve prolapse)    Neuropathy    Nocturia    Osteoarthritis    Osteoporosis    PONV (postoperative nausea and vomiting)    Psychotic affective disorder (HCC)    "psychotic delusions" "I cut myself"    Restless leg syndrome    Rheumatic fever    Urinary frequency  Past Surgical History:  Procedure Laterality Date   ABDOMINAL HYSTERECTOMY     ANTERIOR CERVICAL DECOMP/DISCECTOMY FUSION N/A 09/28/2017   Procedure: ANTERIOR CERVICAL DECOMPRESSION/DISCECTOMY FUSION CERVICAL FOUR- CERVICAL FIVE, CERVICAL FIVE- CERVICAL SIX, CERVICAL SIX- CERVICAL SEVEN;  Surgeon: Isadora Mar, MD;  Location: [facility] OR;  Service: Neurosurgery;  Laterality: N/A;  ANTERIOR CERVICAL DECOMPRESSION/DISCECTOMY FUSION CERVICAL FOUR- CERVICAL FIVE, CERVICAL FIVE- CERVICAL SIX, CERVICAL SIX- CERVICAL SEVEN   BACK SURGERY     BACK SURGERY     2 plates, 8 screws in back   carpell tunnell     rt wrist  x2   CATARACT  EXTRACTION W/PHACO Left 06/14/2016   Procedure: CATARACT EXTRACTION PHACO AND INTRAOCULAR LENS PLACEMENT; CDE:  3.92;  Surgeon: Clay Cummins, MD;  Location: [facility];  Service: Ophthalmology;  Laterality: Left;   CHOLECYSTECTOMY     COLONOSCOPY  5/02   pancolonic deverticula, internal hemorrhoids   COLONOSCOPY  1/11   single external hemorrhoidal tag and anal papilla otherwise normal rectum, pancolonic diverticula, s/p sigmoid biopsy and stool sampling all unremarkable   ESOPHAGOGASTRODUODENOSCOPY  04/06/2010   intact Nissen fundoplication S/P dilation, somewhat baggy atonic appearing esophagus, 58-F dilation   ESOPHAGOGASTRODUODENOSCOPY  1/11   nomral esophagus s/p 54-F Maloney dilation, normal/intact Nissen fundoplication, diffuse patchy erythema and erosions likely NSAID/ASA effect with benign biopsy   EYE SURGERY     right eye cataract   kidney surgery for cancer     MAXIMUM ACCESS (MAS)POSTERIOR LUMBAR INTERBODY FUSION (PLIF) 1 LEVEL N/A 07/25/2014   Procedure: FOR MAXIMUM ACCESS (MAS) POSTERIOR LUMBAR INTERBODY FUSION (PLIF) Lumbar two/three, Removal of hardware Lumbar three/four;  Surgeon: Isadora Mar, MD;  Location: [facility];  Service: Neurosurgery;  Laterality: N/A;   MAXIMUM ACCESS (MAS)POSTERIOR LUMBAR INTERBODY FUSION (PLIF) 2 LEVEL N/A 09/11/2015   Procedure: LUMBAR FOUR-FIVE LUMBAR FIVE SACRAL ONE  MAXIMUM ACCESS SURGERY, POSTERIOR LUMBAR INTERBODY FUSION , Removal of LUMBAR TWO TO LUMBAR FOUR  HARDWARE;  Surgeon: Isadora Mar, MD;  Location: [facility];  Service: Neurosurgery;  Laterality: N/A;   NISSEN FUNDOPLICATION     REVERSE SHOULDER ARTHROPLASTY Right 05/28/2016   Procedure: RIGHT REVERSE SHOULDER ARTHROPLASTY;  Surgeon: Winston Hawking, MD;  Location: [facility] OR;  Service: Orthopedics;  Laterality: Right;   ROBOT ASSISTED LAPAROSCOPIC NEPHRECTOMY  08/23/2011   Procedure: ROBOTIC ASSISTED LAPAROSCOPIC NEPHRECTOMY;  Surgeon: Kristeen Peto, MD;  Location: [facility];  Service: Urology;   Laterality: Left;  Left Robotic Assisted  Laparoscopic Partial Nephrectomy    ROTATOR CUFF REPAIR     right side X 2   TONSILLECTOMY     TOTAL SHOULDER REVISION Right 08/19/2017   Procedure: RIGHT SHOULDER POLYETHYLENE EXCHANGE;  Surgeon: Winston Hawking, MD;  Location: [facility] OR;  Service: Orthopedics;  Laterality: Right;    Family Psychiatric History: See below  Family History:  Family History  Problem Relation Age of Onset   Bipolar disorder Daughter    Colon cancer Neg Hx     Social History:  Social History   Socioeconomic History   Marital status: Divorced    Spouse name: Not on file   Number of children: Not on file   Years of education: Not on file   Highest education level: Not on file  Occupational History   Not on file  Tobacco Use   Smoking status: Never   Smokeless tobacco: Never  Substance and Sexual Activity   Alcohol use: No   Drug use: No   Sexual activity:  Not Currently  Other Topics Concern   Not on file  Social History Narrative   Not on file   Social Drivers of Health   Financial Resource Strain: Not on file  Food Insecurity: No Food Insecurity (08/27/2023)   Hunger Vital Sign    Worried About Running Out of Food in the Last Year: Never true    Ran Out of Food in the Last Year: Never true  Transportation Needs: No Transportation Needs (08/27/2023)   PRAPARE - Administrator, Civil Service (Medical): No    Lack of Transportation (Non-Medical): No  Physical Activity: Not on file  Stress: Not on file  Social Connections: Not on file    Allergies:  Allergies  Allergen Reactions   Shellfish Allergy Anaphylaxis   Neurontin  [Gabapentin ] Other (See Comments)    Unable to talk    Darvon [Propoxyphene] Nausea And Vomiting   Dilaudid  [Hydromorphone ] Nausea And Vomiting   Codeine Itching   Haldol [Haloperidol Lactate] Other (See Comments)    Muscle spasms   Hibiclens  [Chlorhexidine  Gluconate] Rash and Other (See Comments)    Skin burning    Latex Rash    adhesives   Vicodin [Hydrocodone-Acetaminophen ] Itching   Wound Dressing Adhesive Rash    Metabolic Disorder Labs: No results found for: "HGBA1C", "MPG" No results found for: "PROLACTIN" No results found for: "CHOL", "TRIG", "HDL", "CHOLHDL", "VLDL", "LDLCALC" Lab Results  Component Value Date   TSH 2.25 01/28/2016    Therapeutic Level Labs: No results found for: "LITHIUM" No results found for: "VALPROATE" No results found for: "CBMZ"  Current Medications: Current Outpatient Medications  Medication Sig Dispense Refill   ALPRAZolam  (XANAX ) 1 MG tablet Take 1 tablet (1 mg total) by mouth 2 (two) times daily. 60 tablet 3   amLODipine  (NORVASC ) 10 MG tablet Take 1 tablet (10 mg total) by mouth daily. 30 tablet 1   Ascorbic Acid (VITAMIN C PO) Take 1 tablet by mouth daily.     Aspirin -Acetaminophen -Caffeine (GOODY HEADACHE PO) Take 1 packet by mouth daily as needed (headache, pain).     benzonatate  (TESSALON ) 200 MG capsule Take 1 capsule (200 mg total) by mouth 3 (three) times daily. 20 capsule 0   Cholecalciferol (VITAMIN D3 PO) Take 1 tablet by mouth daily.     cholestyramine  (QUESTRAN ) 4 GM/DOSE powder TAKE 1 SCOOPFUL OF 4 GRAMS AND MIX WITH LIQUID OR APPLESAUCE AS DIRECTED ON PACKAGING AND TAKE BY MOUTH DAILY - DO NOT TAKE WITHIN 2 HRS OF OTHER MEDS (Patient taking differently: Take 4 g by mouth at bedtime.) 378 g 1   famotidine  (PEPCID ) 40 MG tablet Take 1 tablet (40 mg total) by mouth daily. 30 tablet 0   FLUoxetine  (PROZAC ) 40 MG capsule Take 1 capsule (40 mg total) by mouth daily. 90 capsule 2   guaiFENesin -dextromethorphan  (ROBITUSSIN DM) 100-10 MG/5ML syrup Take 10 mLs by mouth every 8 (eight) hours. 118 mL 0   methylPREDNISolone  (MEDROL  DOSEPAK) 4 MG TBPK tablet Medrol  Dosepak take as instructed 21 tablet 0   Multiple Vitamin (MULTIVITAMIN WITH MINERALS) TABS Take 1 tablet by mouth daily.     Multiple Vitamins-Minerals (HAIR/SKIN/NAILS) CAPS Take 1 capsule  by mouth daily.     nitroGLYCERIN  (NITROSTAT ) 0.4 MG SL tablet Place 1 tablet (0.4 mg total) under the tongue every 5 (five) minutes as needed for up to 3 days for chest pain. 3 tablet 12   OLANZapine  (ZYPREXA ) 10 MG tablet Take 1 tablet (10 mg total) by mouth at  bedtime. 30 tablet 2   oxybutynin  (DITROPAN  XL) 15 MG 24 hr tablet Take 15 mg by mouth daily.     pravastatin (PRAVACHOL) 80 MG tablet Take 80 mg by mouth daily.     rOPINIRole  (REQUIP ) 4 MG tablet Take 4 mg by mouth daily.     traZODone  (DESYREL ) 100 MG tablet Take 1 tablet (100 mg total) by mouth at bedtime. 90 tablet 2   No current facility-administered medications for this visit.     Musculoskeletal: Strength & Muscle Tone: na Gait & Station: n/a Patient leans: N/A  Psychiatric Specialty Exam: Review of Systems  Psychiatric/Behavioral:  The patient is nervous/anxious.   All other systems reviewed and are negative.   There were no vitals taken for this visit.There is no height or weight on file to calculate BMI.  General Appearance: Casual and Fairly Groomed  Eye Contact: n/a  Speech:  Clear and Coherent  Volume:  Normal  Mood:  Anxious and Euthymic  Affect:  NA  Thought Process:  Goal Directed  Orientation:  Full (Time, Place, and Person)  Thought Content: Rumination   Suicidal Thoughts:  No  Homicidal Thoughts:  No  Memory:  Immediate;   Good Recent;   Fair Remote;   NA  Judgement:  Fair  Insight:  Fair  Psychomotor Activity:  Decreased  Concentration:  Concentration: Fair and Attention Span: Fair  Recall:  [employer] of Knowledge: Fair  Language: Good  Akathisia:  No  Handed:  Right  AIMS (if indicated): not done  Assets:  Communication Skills Desire for Improvement Resilience Social Support  ADL's:  Intact  Cognition: Impaired,  Mild  Sleep:  Good   Screenings: PHQ2-9    Flowsheet Row Video Visit from 06/08/2022 in [facility] [place] Video Visit from 02/23/2022  in [facility] [place] Video Visit from 11/26/2021 in [facility] [place] Video Visit from 06/17/2021 in [facility] at [place] Video Visit from 01/22/2021 in [facility] Total Score 1 0 1 1 6   PHQ-9 Total Score -- -- -- -- 73      Flowsheet Row ED to Hosp-Admission (Discharged) from 08/27/2023 in [facility] Video Visit from 06/08/2022 in [facility] [place] Video Visit from 02/23/2022 in [facility] at [place]  C-SSRS RISK CATEGORY No Risk No Risk No Risk        Assessment and Plan: This patient is a 78 year old female with a history of bipolar disorder and early dementia.  She has been stable other than having run out of her Xanax  which is causing more anxiety.  Will reinstate the Xanax  1 mg twice daily for anxiety, continue Prozac  40 mg daily for depression, olanzapine  10 mg daily for bipolar disorder and trazodone  100 mg at bedtime for sleep.  She will return to see me in 3 months  Collaboration of Care: Collaboration of Care: Primary Care Provider AEB notes to be shared with PCP at patient's request  Patient/Guardian was advised Release of Information must be obtained prior to any record release in order to collaborate their care with an outside provider. Patient/Guardian was advised if they have not already done so to contact the registration department to sign all necessary forms in order for us  to release information regarding their care.   Consent: Patient/Guardian gives verbal consent for treatment and  assignment of benefits for services provided during this visit. Patient/Guardian expressed understanding and agreed to proceed.    Victoria Annas, MD 02/16/2024, 2:55 PM

## 2024-03-07 DIAGNOSIS — R296 Repeated falls: Secondary | ICD-10-CM | POA: Diagnosis not present

## 2024-03-07 DIAGNOSIS — N1831 Chronic kidney disease, stage 3a: Secondary | ICD-10-CM | POA: Diagnosis not present

## 2024-03-07 DIAGNOSIS — I10 Essential (primary) hypertension: Secondary | ICD-10-CM | POA: Diagnosis not present

## 2024-03-07 DIAGNOSIS — Z0000 Encounter for general adult medical examination without abnormal findings: Secondary | ICD-10-CM | POA: Diagnosis not present

## 2024-03-07 DIAGNOSIS — G2581 Restless legs syndrome: Secondary | ICD-10-CM | POA: Diagnosis not present

## 2024-03-07 DIAGNOSIS — E782 Mixed hyperlipidemia: Secondary | ICD-10-CM | POA: Diagnosis not present

## 2024-03-07 DIAGNOSIS — L03317 Cellulitis of buttock: Secondary | ICD-10-CM | POA: Diagnosis not present

## 2024-03-07 DIAGNOSIS — M818 Other osteoporosis without current pathological fracture: Secondary | ICD-10-CM | POA: Diagnosis not present

## 2024-04-28 ENCOUNTER — Other Ambulatory Visit (HOSPITAL_COMMUNITY): Payer: Self-pay | Admitting: Psychiatry

## 2024-04-30 ENCOUNTER — Telehealth (HOSPITAL_COMMUNITY): Payer: Self-pay

## 2024-04-30 ENCOUNTER — Other Ambulatory Visit (HOSPITAL_COMMUNITY): Payer: Self-pay | Admitting: Psychiatry

## 2024-04-30 MED ORDER — ALPRAZOLAM 1 MG PO TABS: 1.0000 mg | ORAL_TABLET | Freq: Two times a day (BID) | ORAL | 0 refills | Status: DC

## 2024-04-30 NOTE — Telephone Encounter (Signed)
 Sent, please call pt for appt

## 2024-04-30 NOTE — Telephone Encounter (Signed)
 Called pt to schedule appt no answer left vm

## 2024-04-30 NOTE — Telephone Encounter (Signed)
 Medication management - Call with Terrall, pharmacy tech with [facility] to follow up on a new order request for pt's Alprazolam . Collateral verified they filled for #48 on 5/29, for #60 on 03/07/24, 03/31/24, and 04/28/24. States pt will need a new order to be sent in by the end of the month for them to deliver in September.  Informed this request would be sent to patient's provider and that pt would need an appointment coming up since seen last on 02/16/24.

## 2024-05-17 ENCOUNTER — Telehealth (HOSPITAL_COMMUNITY): Admitting: Psychiatry

## 2024-06-01 ENCOUNTER — Other Ambulatory Visit (HOSPITAL_COMMUNITY): Payer: Self-pay | Admitting: Psychiatry

## 2024-06-01 NOTE — Telephone Encounter (Signed)
 Call for appt

## 2024-06-04 ENCOUNTER — Other Ambulatory Visit (HOSPITAL_COMMUNITY): Payer: Self-pay | Admitting: Psychiatry

## 2024-06-13 DIAGNOSIS — E1165 Type 2 diabetes mellitus with hyperglycemia: Secondary | ICD-10-CM | POA: Diagnosis not present

## 2024-06-13 DIAGNOSIS — G2581 Restless legs syndrome: Secondary | ICD-10-CM | POA: Diagnosis not present

## 2024-06-13 DIAGNOSIS — R296 Repeated falls: Secondary | ICD-10-CM | POA: Diagnosis not present

## 2024-06-13 DIAGNOSIS — N1831 Chronic kidney disease, stage 3a: Secondary | ICD-10-CM | POA: Diagnosis not present

## 2024-06-13 DIAGNOSIS — I10 Essential (primary) hypertension: Secondary | ICD-10-CM | POA: Diagnosis not present

## 2024-06-13 DIAGNOSIS — M1710 Unilateral primary osteoarthritis, unspecified knee: Secondary | ICD-10-CM | POA: Diagnosis not present

## 2024-06-13 DIAGNOSIS — E782 Mixed hyperlipidemia: Secondary | ICD-10-CM | POA: Diagnosis not present

## 2024-06-13 DIAGNOSIS — M818 Other osteoporosis without current pathological fracture: Secondary | ICD-10-CM | POA: Diagnosis not present

## 2024-06-18 ENCOUNTER — Encounter (INDEPENDENT_AMBULATORY_CARE_PROVIDER_SITE_OTHER): Payer: Self-pay | Admitting: *Deleted

## 2024-06-25 ENCOUNTER — Encounter (HOSPITAL_COMMUNITY): Payer: Self-pay | Admitting: Psychiatry

## 2024-06-25 ENCOUNTER — Telehealth (INDEPENDENT_AMBULATORY_CARE_PROVIDER_SITE_OTHER): Admitting: Psychiatry

## 2024-06-25 DIAGNOSIS — F3130 Bipolar disorder, current episode depressed, mild or moderate severity, unspecified: Secondary | ICD-10-CM | POA: Diagnosis not present

## 2024-06-25 MED ORDER — ALPRAZOLAM 1 MG PO TABS: 1.0000 mg | ORAL_TABLET | Freq: Two times a day (BID) | ORAL | 0 refills | Status: DC

## 2024-06-25 MED ORDER — FLUOXETINE HCL 40 MG PO CAPS: 40.0000 mg | ORAL_CAPSULE | Freq: Every day | ORAL | 2 refills | Status: DC

## 2024-06-25 MED ORDER — TRAZODONE HCL 100 MG PO TABS: 100.0000 mg | ORAL_TABLET | Freq: Every day | ORAL | 2 refills | Status: DC

## 2024-06-25 MED ORDER — OLANZAPINE 10 MG PO TABS: 10.0000 mg | ORAL_TABLET | Freq: Every day | ORAL | 2 refills | Status: DC

## 2024-06-25 NOTE — Progress Notes (Signed)
 Virtual Visit via Telephone Note  I connected with Victoria Mccarthy on 06/25/24 at  1:40 PM EDT by telephone and verified that I am speaking with the correct person using two identifiers.  Location: Patient: home Provider: office   I discussed the limitations, risks, security and privacy concerns of performing an evaluation and management service by telephone and the availability of in person appointments. I also discussed with the patient that there may be a patient responsible charge related to this service. The patient expressed understanding and agreed to proceed.       I discussed the assessment and treatment plan with the patient. The patient was provided an opportunity to ask questions and all were answered. The patient agreed with the plan and demonstrated an understanding of the instructions.   The patient was advised to call back or seek an in-person evaluation if the symptoms worsen or if the condition fails to improve as anticipated.  I provided 20 minutes of non-face-to-face time during this encounter.   Barnie Gull, MD  [facility] MD/PA/NP OP Progress Note  06/25/2024 1:52 PM AUNDRIA Mccarthy  MRN:  [id number]  Chief Complaint:  Chief Complaint  Patient presents with   Anxiety   Depression   Manic Behavior   Follow-up   HPI: This patient is a 78 year old white female who lives with her daughter Corean in [place].  She has 2 children.  She is a retired Psychologist, counselling.  The patient returns for follow-up after about 4 months regarding her bipolar disorder and memory loss.  She states that overall she is doing very well.  Her daughter works a lot as a Land but she is able to stay home and relax.  Her daughter cleans the house and cooks.  She is thinking of getting a pottery wheel to resume her pottery hobby.  She states that her mood has been good and she denies significant depression.  She denies manic symptoms such as racing thoughts or impulsivity.  She denies  auditory or visual hallucinations or thoughts of self-harm or suicide.  She denies any recent problems with memory loss and she seems to be doing much better. Visit Diagnosis:    ICD-10-CM   1. Bipolar I disorder, most recent episode depressed (HCC)  F31.30       Past Psychiatric History: Psychiatric hospitalization in the 1980s, since then outpatient treatment for bipolar disorder  Past Medical History:  Past Medical History:  Diagnosis Date   Anemia    hx   Anxiety    Arthritis    chronic back pain   Bipolar disorder (HCC)    Cancer (HCC) Jan 2013   kidney cancer, left, s/p partial nephrectomy   Depression    Diarrhea    incontinent stools   Diverticulitis    treated at least 5 times in past, hospitalized twice   Heart murmur    History of hiatal hernia    s/p   MVP (mitral valve prolapse)    Neuropathy    Nocturia    Osteoarthritis    Osteoporosis    PONV (postoperative nausea and vomiting)    Psychotic affective disorder    psychotic delusions I cut myself    Restless leg syndrome    Rheumatic fever    Urinary frequency     Past Surgical History:  Procedure Laterality Date   ABDOMINAL HYSTERECTOMY     ANTERIOR CERVICAL DECOMP/DISCECTOMY FUSION N/A 09/28/2017   Procedure: ANTERIOR CERVICAL DECOMPRESSION/DISCECTOMY FUSION CERVICAL FOUR- CERVICAL FIVE,  CERVICAL FIVE- CERVICAL SIX, CERVICAL SIX- CERVICAL SEVEN;  Surgeon: Joshua Alm RAMAN, MD;  Location: [facility] OR;  Service: Neurosurgery;  Laterality: N/A;  ANTERIOR CERVICAL DECOMPRESSION/DISCECTOMY FUSION CERVICAL FOUR- CERVICAL FIVE, CERVICAL FIVE- CERVICAL SIX, CERVICAL SIX- CERVICAL SEVEN   BACK SURGERY     BACK SURGERY     2 plates, 8 screws in back   carpell tunnell     rt wrist  x2   CATARACT EXTRACTION W/PHACO Left 06/14/2016   Procedure: CATARACT EXTRACTION PHACO AND INTRAOCULAR LENS PLACEMENT; CDE:  3.92;  Surgeon: Dow JULIANNA Burke, MD;  Location: [facility];  Service: Ophthalmology;  Laterality: Left;    CHOLECYSTECTOMY     COLONOSCOPY  5/02   pancolonic deverticula, internal hemorrhoids   COLONOSCOPY  1/11   single external hemorrhoidal tag and anal papilla otherwise normal rectum, pancolonic diverticula, s/p sigmoid biopsy and stool sampling all unremarkable   ESOPHAGOGASTRODUODENOSCOPY  04/06/2010   intact Nissen fundoplication S/P dilation, somewhat baggy atonic appearing esophagus, 58-F dilation   ESOPHAGOGASTRODUODENOSCOPY  1/11   nomral esophagus s/p 54-F Maloney dilation, normal/intact Nissen fundoplication, diffuse patchy erythema and erosions likely NSAID/ASA effect with benign biopsy   EYE SURGERY     right eye cataract   kidney surgery for cancer     MAXIMUM ACCESS (MAS)POSTERIOR LUMBAR INTERBODY FUSION (PLIF) 1 LEVEL N/A 07/25/2014   Procedure: FOR MAXIMUM ACCESS (MAS) POSTERIOR LUMBAR INTERBODY FUSION (PLIF) Lumbar two/three, Removal of hardware Lumbar three/four;  Surgeon: Alm RAMAN Joshua, MD;  Location: [facility];  Service: Neurosurgery;  Laterality: N/A;   MAXIMUM ACCESS (MAS)POSTERIOR LUMBAR INTERBODY FUSION (PLIF) 2 LEVEL N/A 09/11/2015   Procedure: LUMBAR FOUR-FIVE LUMBAR FIVE SACRAL ONE  MAXIMUM ACCESS SURGERY, POSTERIOR LUMBAR INTERBODY FUSION , Removal of LUMBAR TWO TO LUMBAR FOUR  HARDWARE;  Surgeon: Alm RAMAN Joshua, MD;  Location: [facility];  Service: Neurosurgery;  Laterality: N/A;   NISSEN FUNDOPLICATION     REVERSE SHOULDER ARTHROPLASTY Right 05/28/2016   Procedure: RIGHT REVERSE SHOULDER ARTHROPLASTY;  Surgeon: Marcey Her, MD;  Location: [facility] OR;  Service: Orthopedics;  Laterality: Right;   ROBOT ASSISTED LAPAROSCOPIC NEPHRECTOMY  08/23/2011   Procedure: ROBOTIC ASSISTED LAPAROSCOPIC NEPHRECTOMY;  Surgeon: Noretta Ferrara, MD;  Location: [facility];  Service: Urology;  Laterality: Left;  Left Robotic Assisted  Laparoscopic Partial Nephrectomy    ROTATOR CUFF REPAIR     right side X 2   TONSILLECTOMY     TOTAL SHOULDER REVISION Right 08/19/2017   Procedure: RIGHT SHOULDER  POLYETHYLENE EXCHANGE;  Surgeon: Her Marcey, MD;  Location[facility];  Service: Orthopedics;  Laterality: Right;    Family Psychiatric History: See below  Family History:  Family History  Problem Relation Age of Onset   Bipolar disorder Daughter    Colon cancer Neg Hx     Social History:  Social History   Socioeconomic History   Marital status: Divorced    Spouse name: Not on file   Number of children: Not on file   Years of education: Not on file   Highest education level: Not on file  Occupational History   Not on file  Tobacco Use   Smoking status: Never   Smokeless tobacco: Never  Substance and Sexual Activity   Alcohol use: No   Drug use: No   Sexual activity: Not Currently  Other Topics Concern   Not on file  Social History Narrative   Not on file   Social Drivers of Health   Financial Resource Strain: Not  on file  Food Insecurity: No Food Insecurity (08/27/2023)   Hunger Vital Sign    Worried About Running Out of Food in the Last Year: Never true    Ran Out of Food in the Last Year: Never true  Transportation Needs: No Transportation Needs (08/27/2023)   PRAPARE - Administrator, Civil Service (Medical): No    Lack of Transportation (Non-Medical): No  Physical Activity: Not on file  Stress: Not on file  Social Connections: Not on file    Allergies:  Allergies  Allergen Reactions   Shellfish Allergy Anaphylaxis   Neurontin  [Gabapentin ] Other (See Comments)    Unable to talk    Darvon [Propoxyphene] Nausea And Vomiting   Dilaudid  [Hydromorphone ] Nausea And Vomiting   Codeine Itching   Haldol [Haloperidol Lactate] Other (See Comments)    Muscle spasms   Hibiclens  [Chlorhexidine  Gluconate] Rash and Other (See Comments)    Skin burning   Latex Rash    adhesives   Vicodin [Hydrocodone-Acetaminophen ] Itching   Wound Dressing Adhesive Rash    Metabolic Disorder Labs: No results found for: HGBA1C, MPG No results found for:  PROLACTIN No results found for: CHOL, TRIG, HDL, CHOLHDL, VLDL, LDLCALC Lab Results  Component Value Date   TSH 2.25 01/28/2016    Therapeutic Level Labs: No results found for: LITHIUM No results found for: VALPROATE No results found for: CBMZ  Current Medications: Current Outpatient Medications  Medication Sig Dispense Refill   ALPRAZolam  (XANAX ) 1 MG tablet Take 1 tablet (1 mg total) by mouth 2 (two) times daily. 60 tablet 0   amLODipine  (NORVASC ) 10 MG tablet Take 1 tablet (10 mg total) by mouth daily. 30 tablet 1   Ascorbic Acid (VITAMIN C PO) Take 1 tablet by mouth daily.     Aspirin -Acetaminophen -Caffeine (GOODY HEADACHE PO) Take 1 packet by mouth daily as needed (headache, pain).     benzonatate  (TESSALON ) 200 MG capsule Take 1 capsule (200 mg total) by mouth 3 (three) times daily. 20 capsule 0   Cholecalciferol (VITAMIN D3 PO) Take 1 tablet by mouth daily.     cholestyramine  (QUESTRAN ) 4 GM/DOSE powder TAKE 1 SCOOPFUL OF 4 GRAMS AND MIX WITH LIQUID OR APPLESAUCE AS DIRECTED ON PACKAGING AND TAKE BY MOUTH DAILY - DO NOT TAKE WITHIN 2 HRS OF OTHER MEDS (Patient taking differently: Take 4 g by mouth at bedtime.) 378 g 1   famotidine  (PEPCID ) 40 MG tablet Take 1 tablet (40 mg total) by mouth daily. 30 tablet 0   FLUoxetine  (PROZAC ) 40 MG capsule Take 1 capsule (40 mg total) by mouth daily. 90 capsule 2   guaiFENesin -dextromethorphan  (ROBITUSSIN DM) 100-10 MG/5ML syrup Take 10 mLs by mouth every 8 (eight) hours. 118 mL 0   methylPREDNISolone  (MEDROL  DOSEPAK) 4 MG TBPK tablet Medrol  Dosepak take as instructed 21 tablet 0   Multiple Vitamin (MULTIVITAMIN WITH MINERALS) TABS Take 1 tablet by mouth daily.     Multiple Vitamins-Minerals (HAIR/SKIN/NAILS) CAPS Take 1 capsule by mouth daily.     nitroGLYCERIN  (NITROSTAT ) 0.4 MG SL tablet Place 1 tablet (0.4 mg total) under the tongue every 5 (five) minutes as needed for up to 3 days for chest pain. 3 tablet 12    OLANZapine  (ZYPREXA ) 10 MG tablet Take 1 tablet (10 mg total) by mouth at bedtime. 30 tablet 2   oxybutynin  (DITROPAN  XL) 15 MG 24 hr tablet Take 15 mg by mouth daily.     pravastatin (PRAVACHOL) 80 MG tablet Take 80 mg  by mouth daily.     rOPINIRole  (REQUIP ) 4 MG tablet Take 4 mg by mouth daily.     traZODone  (DESYREL ) 100 MG tablet Take 1 tablet (100 mg total) by mouth at bedtime. 90 tablet 2   No current facility-administered medications for this visit.     Musculoskeletal: Strength & Muscle Tone: na Gait & Station: na Patient leans: N/A  Psychiatric Specialty Exam: Review of Systems  All other systems reviewed and are negative.   There were no vitals taken for this visit.There is no height or weight on file to calculate BMI.  General Appearance: NA  Eye Contact:  NA  Speech:  Clear and Coherent  Volume:  Normal  Mood:  Euthymic  Affect:  NA  Thought Process:  Goal Directed  Orientation:  Full (Time, Place, and Person)  Thought Content: WDL   Suicidal Thoughts:  No  Homicidal Thoughts:  No  Memory:  Immediate;   Good Recent;   Good Remote;   NA  Judgement:  Good  Insight:  Fair  Psychomotor Activity:  NA  Concentration:  Concentration: Good and Attention Span: Good  Recall:  Fair  Fund of Knowledge: Good  Language: Good  Akathisia:  No  Handed:  Right  AIMS (if indicated): not done  Assets:  Communication Skills Desire for Improvement Resilience Social Support  ADL's:  Intact  Cognition: WNL  Sleep:  Good   Screenings: PHQ2-9    Flowsheet Row Video Visit from 06/08/2022 in [facility] [place] Video Visit from 02/23/2022 in [facility] [place] Video Visit from 11/26/2021 in [facility] [place] Video Visit from 06/17/2021 in [facility] [place] Video Visit from 01/22/2021 in [facility] Total Score 1 0 1 1 6   PHQ-9 Total Score -- -- -- -- 62   Flowsheet Row ED to Hosp-Admission (Discharged) from 08/27/2023 in [facility] Video Visit from 06/08/2022 in [facility] [place] Video Visit from 02/23/2022 in [facility] at [place]  C-SSRS RISK CATEGORY No Risk No Risk No Risk     Assessment and Plan:  This patient is a 78 year old female with a history of bipolar disorder.  I am unclear about the memory loss as this seems to be stable at the present time.  She states that she is doing well on her current regimen.  She will continue Xanax  1 mg twice daily for anxiety, Prozac  40 mg daily for depression, olanzapine  10 mg daily for bipolar disorder and trazodone  100 mg at bedtime for sleep.  She will return to see me in 3 months Collaboration of Care: Collaboration of Care: Primary Care Provider AEB notes will be shared with PCP at patient's request  Patient/Guardian was advised Release of Information must be obtained prior to any record release in order to collaborate their care with an outside provider. Patient/Guardian was advised if they have not already done so to contact the registration department to sign all necessary forms in order for us  to release information regarding their care.   Consent: Patient/Guardian gives verbal consent for treatment and assignment of benefits for services provided during this visit. Patient/Guardian expressed understanding and agreed to proceed.    Barnie Gull, MD 06/25/2024, 1:52 PM

## 2024-06-27 NOTE — Progress Notes (Signed)
 Victoria Mccarthy                                          MRN: [id number]   06/27/2024   The [facility] Quality Team Specialist reviewed this patient medical record for the purposes of chart review for care gap closure. The following were reviewed: chart review for care gap closure-kidney health evaluation for diabetes:eGFR  and uACR.    [facility]I Quality Team

## 2024-06-28 DIAGNOSIS — E1165 Type 2 diabetes mellitus with hyperglycemia: Secondary | ICD-10-CM | POA: Diagnosis not present

## 2024-08-04 ENCOUNTER — Other Ambulatory Visit (HOSPITAL_COMMUNITY): Payer: Self-pay | Admitting: Psychiatry

## 2024-09-25 ENCOUNTER — Telehealth (HOSPITAL_COMMUNITY): Admitting: Psychiatry

## 2024-09-25 ENCOUNTER — Other Ambulatory Visit (HOSPITAL_COMMUNITY): Payer: Self-pay | Admitting: Psychiatry

## 2024-10-02 ENCOUNTER — Encounter (HOSPITAL_COMMUNITY): Payer: Self-pay | Admitting: Psychiatry

## 2024-10-02 ENCOUNTER — Telehealth (INDEPENDENT_AMBULATORY_CARE_PROVIDER_SITE_OTHER): Admitting: Psychiatry

## 2024-10-02 DIAGNOSIS — F3130 Bipolar disorder, current episode depressed, mild or moderate severity, unspecified: Secondary | ICD-10-CM | POA: Diagnosis not present

## 2024-10-02 MED ORDER — OLANZAPINE 10 MG PO TABS: 10.0000 mg | ORAL_TABLET | Freq: Every day | ORAL | 1 refills | Status: AC

## 2024-10-02 MED ORDER — TRAZODONE HCL 150 MG PO TABS: 150.0000 mg | ORAL_TABLET | Freq: Every day | ORAL | 2 refills | Status: AC

## 2024-10-02 MED ORDER — ALPRAZOLAM 1 MG PO TABS: 1.0000 mg | ORAL_TABLET | Freq: Two times a day (BID) | ORAL | 2 refills | Status: DC

## 2024-10-02 MED ORDER — FLUOXETINE HCL 40 MG PO CAPS: 40.0000 mg | ORAL_CAPSULE | Freq: Every day | ORAL | 2 refills | Status: AC

## 2024-10-02 NOTE — Progress Notes (Signed)
 Virtual Visit via Telephone Note  I connected with Victoria Mccarthy on 10/02/2024 at 11:20 AM EST by telephone and verified that I am speaking with the correct person using two identifiers.  Location: Patient: home Provider: office   I discussed the limitations, risks, security and privacy concerns of performing an evaluation and management service by telephone and the availability of in person appointments. I also discussed with the patient that there may be a patient responsible charge related to this service. The patient expressed understanding and agreed to proceed.      I discussed the assessment and treatment plan with the patient. The patient was provided an opportunity to ask questions and all were answered. The patient agreed with the plan and demonstrated an understanding of the instructions.   The patient was advised to call back or seek an in-person evaluation if the symptoms worsen or if the condition fails to improve as anticipated.  I provided 20 minutes of non-face-to-face time during this encounter.   Victoria Gull, MD  [facility] MD/PA/NP OP Progress Note  10/02/2024 11:24 AM Victoria Mccarthy  MRN:  [id number]  Chief Complaint:  Chief Complaint  Patient presents with   Anxiety   Depression   Manic Behavior   Follow-up   HPI: This patient is a 79 year old white female who lives with her daughter Victoria Mccarthy in [place].  She has 2 children.  She is a retired psychologist, counselling.  The patient returns for follow-up after about 4 months regarding her bipolar disorder.  She continues to state that she is doing well.  She denies significant depression manic symptoms racing thoughts impulsivity or auditory visual hallucinations.  She denies depression or thoughts of self-harm or suicide.  She states that she is having trouble getting to sleep.  She is on trazodone  100 mg at bedtime and I suggested we increase it a little bit to 150 mg. Visit Diagnosis:    ICD-10-CM   1. Bipolar I  disorder, most recent episode depressed (HCC)  F31.30       Past Psychiatric History: Psychiatric hospitalization in the 1980s, since then outpatient treatment for bipolar disorder   Past Medical History:  Past Medical History:  Diagnosis Date   Anemia    hx   Anxiety    Arthritis    chronic back pain   Bipolar disorder (HCC)    Cancer (HCC) Jan 2013   kidney cancer, left, s/p partial nephrectomy   Depression    Diarrhea    incontinent stools   Diverticulitis    treated at least 5 times in past, hospitalized twice   Heart murmur    History of hiatal hernia    s/p   MVP (mitral valve prolapse)    Neuropathy    Nocturia    Osteoarthritis    Osteoporosis    PONV (postoperative nausea and vomiting)    Psychotic affective disorder    psychotic delusions I cut myself    Restless leg syndrome    Rheumatic fever    Urinary frequency     Past Surgical History:  Procedure Laterality Date   ABDOMINAL HYSTERECTOMY     ANTERIOR CERVICAL DECOMP/DISCECTOMY FUSION N/A 09/28/2017   Procedure: ANTERIOR CERVICAL DECOMPRESSION/DISCECTOMY FUSION CERVICAL FOUR- CERVICAL FIVE, CERVICAL FIVE- CERVICAL SIX, CERVICAL SIX- CERVICAL SEVEN;  Surgeon: Joshua Alm RAMAN, MD;  Location: [facility] OR;  Service: Neurosurgery;  Laterality: N/A;  ANTERIOR CERVICAL DECOMPRESSION/DISCECTOMY FUSION CERVICAL FOUR- CERVICAL FIVE, CERVICAL FIVE- CERVICAL SIX, CERVICAL SIX- CERVICAL SEVEN  BACK SURGERY     BACK SURGERY     2 plates, 8 screws in back   carpell tunnell     rt wrist  x2   CATARACT EXTRACTION W/PHACO Left 06/14/2016   Procedure: CATARACT EXTRACTION PHACO AND INTRAOCULAR LENS PLACEMENT; CDE:  3.92;  Surgeon: Dow JULIANNA Burke, MD;  Location: [facility];  Service: Ophthalmology;  Laterality: Left;   CHOLECYSTECTOMY     COLONOSCOPY  5/02   pancolonic deverticula, internal hemorrhoids   COLONOSCOPY  1/11   single external hemorrhoidal tag and anal papilla otherwise normal rectum, pancolonic diverticula, s/p  sigmoid biopsy and stool sampling all unremarkable   ESOPHAGOGASTRODUODENOSCOPY  04/06/2010   intact Nissen fundoplication S/P dilation, somewhat baggy atonic appearing esophagus, 58-F dilation   ESOPHAGOGASTRODUODENOSCOPY  1/11   nomral esophagus s/p 54-F Maloney dilation, normal/intact Nissen fundoplication, diffuse patchy erythema and erosions likely NSAID/ASA effect with benign biopsy   EYE SURGERY     right eye cataract   kidney surgery for cancer     MAXIMUM ACCESS (MAS)POSTERIOR LUMBAR INTERBODY FUSION (PLIF) 1 LEVEL N/A 07/25/2014   Procedure: FOR MAXIMUM ACCESS (MAS) POSTERIOR LUMBAR INTERBODY FUSION (PLIF) Lumbar two/three, Removal of hardware Lumbar three/four;  Surgeon: Alm GORMAN Molt, MD;  Location: [facility];  Service: Neurosurgery;  Laterality: N/A;   MAXIMUM ACCESS (MAS)POSTERIOR LUMBAR INTERBODY FUSION (PLIF) 2 LEVEL N/A 09/11/2015   Procedure: LUMBAR FOUR-FIVE LUMBAR FIVE SACRAL ONE  MAXIMUM ACCESS SURGERY, POSTERIOR LUMBAR INTERBODY FUSION , Removal of LUMBAR TWO TO LUMBAR FOUR  HARDWARE;  Surgeon: Alm GORMAN Molt, MD;  Location: [facility];  Service: Neurosurgery;  Laterality: N/A;   NISSEN FUNDOPLICATION     REVERSE SHOULDER ARTHROPLASTY Right 05/28/2016   Procedure: RIGHT REVERSE SHOULDER ARTHROPLASTY;  Surgeon: Marcey Her, MD;  Location[facility] OR;  Service: Orthopedics;  Laterality: Right;   ROBOT ASSISTED LAPAROSCOPIC NEPHRECTOMY  08/23/2011   Procedure: ROBOTIC ASSISTED LAPAROSCOPIC NEPHRECTOMY;  Surgeon: Noretta Ferrara, MD;  Location: [facility];  Service: Urology;  Laterality: Left;  Left Robotic Assisted  Laparoscopic Partial Nephrectomy    ROTATOR CUFF REPAIR     right side X 2   TONSILLECTOMY     TOTAL SHOULDER REVISION Right 08/19/2017   Procedure: RIGHT SHOULDER POLYETHYLENE EXCHANGE;  Surgeon: Her Marcey, MD;  Location: [facility] OR;  Service: Orthopedics;  Laterality: Right;    Family Psychiatric History: See below  Family History:  Family History  Problem Relation Age  of Onset   Bipolar disorder Daughter    Colon cancer Neg Hx     Social History:  Social History   Socioeconomic History   Marital status: Divorced    Spouse name: Not on file   Number of children: Not on file   Years of education: Not on file   Highest education level: Not on file  Occupational History   Not on file  Tobacco Use   Smoking status: Never   Smokeless tobacco: Never  Substance and Sexual Activity   Alcohol use: No   Drug use: No   Sexual activity: Not Currently  Other Topics Concern   Not on file  Social History Narrative   Not on file   Social Drivers of Health   Tobacco Use: Low Risk (06/25/2024)   Patient History    Smoking Tobacco Use: Never    Smokeless Tobacco Use: Never    Passive Exposure: Not on file  Financial Resource Strain: Not on file  Food Insecurity: No Food Insecurity (08/27/2023)  Hunger Vital Sign    Worried About Running Out of Food in the Last Year: Never true    Ran Out of Food in the Last Year: Never true  Transportation Needs: No Transportation Needs (08/27/2023)   PRAPARE - Administrator, Civil Service (Medical): No    Lack of Transportation (Non-Medical): No  Physical Activity: Not on file  Stress: Not on file  Social Connections: Not on file  Depression (PHQ2-9): Low Risk (06/08/2022)   Depression (PHQ2-9)    PHQ-2 Score: 1  Alcohol Screen: Not on file  Housing: Low Risk (08/27/2023)   Housing    Last Housing Risk Score: 0  Utilities: Not At Risk (08/27/2023)   AHC Utilities    Threatened with loss of utilities: No  Health Literacy: Not on file    Allergies: Allergies[1]  Metabolic Disorder Labs: No results found for: HGBA1C, MPG No results found for: PROLACTIN No results found for: CHOL, TRIG, HDL, CHOLHDL, VLDL, LDLCALC Lab Results  Component Value Date   TSH 2.25 01/28/2016    Therapeutic Level Labs: No results found for: LITHIUM No results found for: VALPROATE No results  found for: CBMZ  Current Medications: Current Outpatient Medications  Medication Sig Dispense Refill   traZODone  (DESYREL ) 150 MG tablet Take 1 tablet (150 mg total) by mouth at bedtime. 90 tablet 2   ALPRAZolam  (XANAX ) 1 MG tablet Take 1 tablet (1 mg total) by mouth 2 (two) times daily. 60 tablet 2   amLODipine  (NORVASC ) 10 MG tablet Take 1 tablet (10 mg total) by mouth daily. 30 tablet 1   Ascorbic Acid (VITAMIN C PO) Take 1 tablet by mouth daily.     Aspirin -Acetaminophen -Caffeine (GOODY HEADACHE PO) Take 1 packet by mouth daily as needed (headache, pain).     benzonatate  (TESSALON ) 200 MG capsule Take 1 capsule (200 mg total) by mouth 3 (three) times daily. 20 capsule 0   Cholecalciferol (VITAMIN D3 PO) Take 1 tablet by mouth daily.     famotidine  (PEPCID ) 40 MG tablet Take 1 tablet (40 mg total) by mouth daily. 30 tablet 0   FLUoxetine  (PROZAC ) 40 MG capsule Take 1 capsule (40 mg total) by mouth daily. 90 capsule 2   guaiFENesin -dextromethorphan  (ROBITUSSIN DM) 100-10 MG/5ML syrup Take 10 mLs by mouth every 8 (eight) hours. 118 mL 0   methylPREDNISolone  (MEDROL  DOSEPAK) 4 MG TBPK tablet Medrol  Dosepak take as instructed 21 tablet 0   Multiple Vitamin (MULTIVITAMIN WITH MINERALS) TABS Take 1 tablet by mouth daily.     Multiple Vitamins-Minerals (HAIR/SKIN/NAILS) CAPS Take 1 capsule by mouth daily.     OLANZapine  (ZYPREXA ) 10 MG tablet Take 1 tablet (10 mg total) by mouth at bedtime. 30 tablet 1   oxybutynin  (DITROPAN  XL) 15 MG 24 hr tablet Take 15 mg by mouth daily.     pravastatin (PRAVACHOL) 80 MG tablet Take 80 mg by mouth daily.     rOPINIRole  (REQUIP ) 4 MG tablet Take 4 mg by mouth daily.     No current facility-administered medications for this visit.     Musculoskeletal: Strength & Muscle Tone: na Gait & Station: na Patient leans: N/A  Psychiatric Specialty Exam: Review of Systems  Musculoskeletal:  Positive for arthralgias.  All other systems reviewed and are  negative.   There were no vitals taken for this visit.There is no height or weight on file to calculate BMI.  General Appearance: NA  Eye Contact:  NA  Speech:  Clear and Coherent  Volume:  Normal  Mood:  Euthymic  Affect:  NA  Thought Process:  Goal Directed  Orientation:  Full (Time, Place, and Person)  Thought Content: WDL   Suicidal Thoughts:  No  Homicidal Thoughts:  No  Memory:  Immediate;   Good Recent;   Good Remote;   NA  Judgement:  Fair  Insight:  Fair  Psychomotor Activity:  Decreased  Concentration:  Concentration: Good and Attention Span: Good  Recall:  Good  Fund of Knowledge: Good  Language: Good  Akathisia:  No  Handed:  Right  AIMS (if indicated): not done  Assets:  Communication Skills Desire for Improvement Resilience Social Support Talents/Skills  ADL's:  Intact  Cognition: WNL  Sleep:  Fair   Screenings: PHQ2-9    Flowsheet Row Video Visit from 06/08/2022 in [facility] [place] Video Visit from 02/23/2022 in [facility] [place] Video Visit from 11/26/2021 in [facility] [place] Video Visit from 06/17/2021 in [facility] [place] Video Visit from 01/22/2021 in [facility] Total Score 1 0 1 1 6   PHQ-9 Total Score -- -- -- -- 55   Flowsheet Row ED to Hosp-Admission (Discharged) from 08/27/2023 in [facility] Video Visit from 06/08/2022 in [facility] [place] Video Visit from 02/23/2022 in [facility] at [place]  C-SSRS RISK CATEGORY No Risk No Risk No Risk     Assessment and Plan: This patient is a 79 year old female with a history of bipolar disorder.  She is doing well although her sleep could be better.  She will therefore increase trazodone  to 150 mg at bedtime for sleep.  She will  continue Xanax  1 mg twice daily for anxiety, Prozac  40 mg daily for depression associated bipolar disorder, olanzapine  10 mg daily for bipolar disorder.  She will return to see me in 3 months  Collaboration of Care: Collaboration of Care: Primary Care Provider AEB notes to be shared with PCP at patient's request  Patient/Guardian was advised Release of Information must be obtained prior to any record release in order to collaborate their care with an outside provider. Patient/Guardian was advised if they have not already done so to contact the registration department to sign all necessary forms in order for us  to release information regarding their care.   Consent: Patient/Guardian gives verbal consent for treatment and assignment of benefits for services provided during this visit. Patient/Guardian expressed understanding and agreed to proceed.    Victoria Gull, MD 10/02/2024, 11:24 AM     [1]  Allergies Allergen Reactions   Shellfish Allergy Anaphylaxis   Neurontin  [Gabapentin ] Other (See Comments)    Unable to talk    Darvon [Propoxyphene] Nausea And Vomiting   Dilaudid  [Hydromorphone ] Nausea And Vomiting   Codeine Itching   Haldol [Haloperidol Lactate] Other (See Comments)    Muscle spasms   Hibiclens  [Chlorhexidine  Gluconate] Rash and Other (See Comments)    Skin burning   Latex Rash    adhesives   Vicodin [Hydrocodone-Acetaminophen ] Itching   Wound Dressing Adhesive Rash

## 2024-10-10 ENCOUNTER — Telehealth (HOSPITAL_COMMUNITY): Payer: Self-pay | Admitting: *Deleted

## 2024-10-10 ENCOUNTER — Other Ambulatory Visit (HOSPITAL_COMMUNITY): Payer: Self-pay | Admitting: Psychiatry

## 2024-10-10 MED ORDER — ALPRAZOLAM 1 MG PO TABS: 1.0000 mg | ORAL_TABLET | Freq: Two times a day (BID) | ORAL | 2 refills | Status: DC

## 2024-10-10 NOTE — Telephone Encounter (Signed)
 [facility] pharmacy called stating they sent patient her Xanax  1mg  to patient on Jan 9th with [employer] and [employer] stated they delivered it but patient stated she never received it. [facility] pharmacy would like to have provider to please send in another script for an early fill. Asked rep from [facility] if they requires a signature from patient for control substance when delivered and she stated no she don't see it in the notes and it depends on the state. Asked rep from [facility] what if patient tested that she didn't receive this one again and she stated that then they will call provider office and go over with provider what's been going on and leave it up to provider to approve another fill or not.    [facility] needs new script for patient Xanax  1mg  to be resent to them for early fill.   [facility] number is [phone].

## 2024-10-10 NOTE — Telephone Encounter (Signed)
 I will resend just this once, not again

## 2024-10-11 NOTE — Telephone Encounter (Signed)
 noted

## 2024-10-12 ENCOUNTER — Other Ambulatory Visit (HOSPITAL_COMMUNITY): Payer: Self-pay | Admitting: Psychiatry

## 2024-10-12 MED ORDER — ALPRAZOLAM 1 MG PO TABS: 1.0000 mg | ORAL_TABLET | Freq: Two times a day (BID) | ORAL | 2 refills | Status: AC

## 2024-10-12 NOTE — Telephone Encounter (Signed)
 sent

## 2024-10-12 NOTE — Telephone Encounter (Signed)
 Pharmacist with [facility] pharmacy called stating they would like the Xanax  script to have okay to fill early on the script. Per [facility], they did receive the script it just need to have in the pharmacy notes to say okay for pharmacy to fill early.   Please resend script for Alprazolam  Xanax  1mg  to [facility] with pharmacy notes saying its okay for them to fill this script early for patient.
